# Patient Record
Sex: Female | Born: 1965
Health system: Southern US, Community
[De-identification: ages and names within clinical notes are randomized; demographics above are authoritative.]

## PROBLEM LIST (undated history)

## (undated) DIAGNOSIS — I422 Other hypertrophic cardiomyopathy: Secondary | ICD-10-CM

## (undated) DIAGNOSIS — C539 Malignant neoplasm of cervix uteri, unspecified: Secondary | ICD-10-CM

## (undated) DIAGNOSIS — I4892 Unspecified atrial flutter: Secondary | ICD-10-CM

## (undated) DIAGNOSIS — J449 Chronic obstructive pulmonary disease, unspecified: Secondary | ICD-10-CM

## (undated) DIAGNOSIS — J45909 Unspecified asthma, uncomplicated: Secondary | ICD-10-CM

## (undated) DIAGNOSIS — G4730 Sleep apnea, unspecified: Secondary | ICD-10-CM

## (undated) DIAGNOSIS — I4891 Unspecified atrial fibrillation: Secondary | ICD-10-CM

## (undated) HISTORY — PX: TUBAL LIGATION: SHX77

---

## 2003-05-20 ENCOUNTER — Ambulatory Visit (HOSPITAL_COMMUNITY): Admission: RE | Admit: 2003-05-20 | Discharge: 2003-05-20 | Payer: Self-pay | Admitting: Family Medicine

## 2003-06-03 ENCOUNTER — Encounter: Admission: RE | Admit: 2003-06-03 | Discharge: 2003-06-03 | Payer: Self-pay | Admitting: Neurosurgery

## 2003-06-14 HISTORY — PX: TRANSESOPHAGEAL ECHOCARDIOGRAM: SHX273

## 2003-06-16 ENCOUNTER — Encounter: Admission: RE | Admit: 2003-06-16 | Discharge: 2003-06-16 | Payer: Self-pay | Admitting: Neurosurgery

## 2003-07-07 ENCOUNTER — Encounter: Admission: RE | Admit: 2003-07-07 | Discharge: 2003-07-07 | Payer: Self-pay | Admitting: Neurosurgery

## 2004-04-21 ENCOUNTER — Ambulatory Visit (HOSPITAL_COMMUNITY): Admission: RE | Admit: 2004-04-21 | Discharge: 2004-04-21 | Payer: Self-pay | Admitting: Family Medicine

## 2004-04-28 ENCOUNTER — Ambulatory Visit (HOSPITAL_COMMUNITY): Admission: RE | Admit: 2004-04-28 | Discharge: 2004-04-28 | Payer: Self-pay | Admitting: Family Medicine

## 2004-06-13 DIAGNOSIS — I4891 Unspecified atrial fibrillation: Secondary | ICD-10-CM

## 2004-06-13 HISTORY — DX: Unspecified atrial fibrillation: I48.91

## 2004-06-13 HISTORY — PX: OTHER SURGICAL HISTORY: SHX169

## 2004-06-21 ENCOUNTER — Ambulatory Visit (HOSPITAL_COMMUNITY): Admission: RE | Admit: 2004-06-21 | Discharge: 2004-06-21 | Payer: Self-pay | Admitting: *Deleted

## 2004-06-21 ENCOUNTER — Encounter (INDEPENDENT_AMBULATORY_CARE_PROVIDER_SITE_OTHER): Payer: Self-pay | Admitting: *Deleted

## 2004-09-15 ENCOUNTER — Inpatient Hospital Stay (HOSPITAL_COMMUNITY): Admission: AD | Admit: 2004-09-15 | Discharge: 2004-09-18 | Payer: Self-pay | Admitting: Pediatrics

## 2004-09-15 ENCOUNTER — Encounter (INDEPENDENT_AMBULATORY_CARE_PROVIDER_SITE_OTHER): Payer: Self-pay | Admitting: Cardiovascular Disease

## 2004-12-09 ENCOUNTER — Encounter (HOSPITAL_COMMUNITY): Admission: RE | Admit: 2004-12-09 | Discharge: 2005-01-08 | Payer: Self-pay | Admitting: *Deleted

## 2005-01-10 ENCOUNTER — Encounter (HOSPITAL_COMMUNITY): Admission: RE | Admit: 2005-01-10 | Discharge: 2005-02-09 | Payer: Self-pay | Admitting: *Deleted

## 2005-02-11 ENCOUNTER — Encounter (HOSPITAL_COMMUNITY): Admission: RE | Admit: 2005-02-11 | Discharge: 2005-03-11 | Payer: Self-pay | Admitting: *Deleted

## 2005-03-14 ENCOUNTER — Encounter (HOSPITAL_COMMUNITY)
Admission: RE | Admit: 2005-03-14 | Discharge: 2005-04-13 | Payer: Self-pay | Admitting: Rehabilitative and Restorative Service Providers"

## 2005-04-27 ENCOUNTER — Ambulatory Visit (HOSPITAL_COMMUNITY): Admission: RE | Admit: 2005-04-27 | Discharge: 2005-04-27 | Payer: Self-pay

## 2006-02-20 ENCOUNTER — Ambulatory Visit (HOSPITAL_COMMUNITY): Admission: RE | Admit: 2006-02-20 | Discharge: 2006-02-20 | Payer: Self-pay | Admitting: Emergency Medicine

## 2006-03-08 ENCOUNTER — Ambulatory Visit (HOSPITAL_COMMUNITY): Admission: RE | Admit: 2006-03-08 | Discharge: 2006-03-08 | Payer: Self-pay | Admitting: Emergency Medicine

## 2007-11-14 ENCOUNTER — Encounter: Payer: Self-pay | Admitting: Obstetrics & Gynecology

## 2007-11-14 ENCOUNTER — Ambulatory Visit (HOSPITAL_COMMUNITY): Admission: RE | Admit: 2007-11-14 | Discharge: 2007-11-14 | Payer: Self-pay | Admitting: Obstetrics & Gynecology

## 2007-12-12 ENCOUNTER — Ambulatory Visit (HOSPITAL_COMMUNITY): Admission: RE | Admit: 2007-12-12 | Discharge: 2007-12-12 | Payer: Self-pay | Admitting: Family Medicine

## 2008-02-25 ENCOUNTER — Encounter (INDEPENDENT_AMBULATORY_CARE_PROVIDER_SITE_OTHER): Payer: Self-pay | Admitting: General Surgery

## 2008-02-25 ENCOUNTER — Observation Stay (HOSPITAL_COMMUNITY): Admission: RE | Admit: 2008-02-25 | Discharge: 2008-02-26 | Payer: Self-pay | Admitting: General Surgery

## 2008-05-11 ENCOUNTER — Emergency Department (HOSPITAL_COMMUNITY): Admission: EM | Admit: 2008-05-11 | Discharge: 2008-05-11 | Payer: Self-pay | Admitting: Emergency Medicine

## 2008-06-11 ENCOUNTER — Emergency Department (HOSPITAL_COMMUNITY): Admission: EM | Admit: 2008-06-11 | Discharge: 2008-06-11 | Payer: Self-pay | Admitting: Emergency Medicine

## 2008-08-28 ENCOUNTER — Ambulatory Visit (HOSPITAL_COMMUNITY): Admission: RE | Admit: 2008-08-28 | Discharge: 2008-08-28 | Payer: Self-pay | Admitting: Family Medicine

## 2008-09-17 ENCOUNTER — Ambulatory Visit (HOSPITAL_COMMUNITY): Admission: RE | Admit: 2008-09-17 | Discharge: 2008-09-17 | Payer: Self-pay | Admitting: Orthopedic Surgery

## 2008-09-17 ENCOUNTER — Ambulatory Visit: Payer: Self-pay | Admitting: Orthopedic Surgery

## 2008-09-17 DIAGNOSIS — M51379 Other intervertebral disc degeneration, lumbosacral region without mention of lumbar back pain or lower extremity pain: Secondary | ICD-10-CM | POA: Insufficient documentation

## 2008-09-17 DIAGNOSIS — M5137 Other intervertebral disc degeneration, lumbosacral region: Secondary | ICD-10-CM

## 2008-09-17 DIAGNOSIS — M5126 Other intervertebral disc displacement, lumbar region: Secondary | ICD-10-CM

## 2008-09-17 DIAGNOSIS — Z8679 Personal history of other diseases of the circulatory system: Secondary | ICD-10-CM | POA: Insufficient documentation

## 2008-09-24 ENCOUNTER — Encounter: Payer: Self-pay | Admitting: Orthopedic Surgery

## 2008-09-24 ENCOUNTER — Encounter (INDEPENDENT_AMBULATORY_CARE_PROVIDER_SITE_OTHER): Payer: Self-pay | Admitting: *Deleted

## 2008-10-01 ENCOUNTER — Telehealth: Payer: Self-pay | Admitting: Orthopedic Surgery

## 2008-10-03 ENCOUNTER — Ambulatory Visit (HOSPITAL_COMMUNITY): Admission: RE | Admit: 2008-10-03 | Discharge: 2008-10-03 | Payer: Self-pay | Admitting: Orthopedic Surgery

## 2008-10-08 ENCOUNTER — Ambulatory Visit: Payer: Self-pay | Admitting: Orthopedic Surgery

## 2008-10-15 ENCOUNTER — Encounter (HOSPITAL_COMMUNITY): Admission: RE | Admit: 2008-10-15 | Discharge: 2008-11-19 | Payer: Self-pay | Admitting: Orthopedic Surgery

## 2008-11-03 ENCOUNTER — Telehealth: Payer: Self-pay | Admitting: Orthopedic Surgery

## 2008-11-24 ENCOUNTER — Encounter (HOSPITAL_COMMUNITY): Admission: RE | Admit: 2008-11-24 | Discharge: 2008-12-24 | Payer: Self-pay | Admitting: Orthopedic Surgery

## 2008-11-26 ENCOUNTER — Encounter: Payer: Self-pay | Admitting: Orthopedic Surgery

## 2009-03-02 ENCOUNTER — Emergency Department (HOSPITAL_COMMUNITY): Admission: EM | Admit: 2009-03-02 | Discharge: 2009-03-02 | Payer: Self-pay | Admitting: Emergency Medicine

## 2009-03-04 ENCOUNTER — Emergency Department (HOSPITAL_COMMUNITY): Admission: EM | Admit: 2009-03-04 | Discharge: 2009-03-04 | Payer: Self-pay | Admitting: Emergency Medicine

## 2009-03-09 ENCOUNTER — Ambulatory Visit (HOSPITAL_COMMUNITY): Admission: RE | Admit: 2009-03-09 | Discharge: 2009-03-09 | Payer: Self-pay | Admitting: Family Medicine

## 2009-05-13 ENCOUNTER — Emergency Department (HOSPITAL_COMMUNITY): Admission: EM | Admit: 2009-05-13 | Discharge: 2009-05-13 | Payer: Self-pay | Admitting: Emergency Medicine

## 2009-05-15 ENCOUNTER — Ambulatory Visit (HOSPITAL_COMMUNITY): Admission: RE | Admit: 2009-05-15 | Discharge: 2009-05-15 | Payer: Self-pay | Admitting: General Surgery

## 2009-09-01 ENCOUNTER — Ambulatory Visit (HOSPITAL_COMMUNITY): Admission: RE | Admit: 2009-09-01 | Discharge: 2009-09-01 | Payer: Self-pay | Admitting: Family Medicine

## 2010-09-14 LAB — URINALYSIS, ROUTINE W REFLEX MICROSCOPIC
Bilirubin Urine: NEGATIVE
Glucose, UA: NEGATIVE mg/dL
Hgb urine dipstick: NEGATIVE
Ketones, ur: 40 mg/dL — AB
Nitrite: NEGATIVE
Protein, ur: NEGATIVE mg/dL
Specific Gravity, Urine: 1.02 (ref 1.005–1.030)
Urobilinogen, UA: 0.2 mg/dL (ref 0.0–1.0)
pH: 6 (ref 5.0–8.0)

## 2010-09-14 LAB — COMPREHENSIVE METABOLIC PANEL
ALT: 55 U/L — ABNORMAL HIGH (ref 0–35)
AST: 54 U/L — ABNORMAL HIGH (ref 0–37)
Albumin: 4.1 g/dL (ref 3.5–5.2)
Alkaline Phosphatase: 137 U/L — ABNORMAL HIGH (ref 39–117)
BUN: 11 mg/dL (ref 6–23)
CO2: 24 mEq/L (ref 19–32)
Calcium: 9 mg/dL (ref 8.4–10.5)
Chloride: 104 mEq/L (ref 96–112)
Creatinine, Ser: 0.79 mg/dL (ref 0.4–1.2)
GFR calc Af Amer: >60 mL/min (ref >60)
GFR calc non Af Amer: >60 mL/min (ref >60)
Glucose, Bld: 114 mg/dL — ABNORMAL HIGH (ref 70–99)
Potassium: 3.6 mEq/L (ref 3.5–5.1)
Sodium: 137 mEq/L (ref 135–145)
Total Bilirubin: 0.8 mg/dL (ref 0.3–1.2)
Total Protein: 7.8 g/dL (ref 6.0–8.3)

## 2010-09-14 LAB — CBC
HCT: 38.3 % (ref 36.0–46.0)
Hemoglobin: 12.8 g/dL (ref 12.0–15.0)
MCHC: 33.5 g/dL (ref 30.0–36.0)
MCV: 90 fL (ref 78.0–100.0)
Platelets: 345 10*3/uL (ref 150–400)
RBC: 4.26 MIL/uL (ref 3.87–5.11)
RDW: 14.1 % (ref 11.5–15.5)
WBC: 15.2 10*3/uL — ABNORMAL HIGH (ref 4.0–10.5)

## 2010-09-14 LAB — DIFFERENTIAL
Basophils Absolute: 0 10*3/uL (ref 0.0–0.1)
Basophils Relative: 0 % (ref 0–1)
Eosinophils Absolute: 0.2 10*3/uL (ref 0.0–0.7)
Eosinophils Relative: 2 % (ref 0–5)
Lymphocytes Relative: 10 % — ABNORMAL LOW (ref 12–46)
Lymphs Abs: 1.6 10*3/uL (ref 0.7–4.0)
Monocytes Absolute: 0.2 10*3/uL (ref 0.1–1.0)
Monocytes Relative: 1 % — ABNORMAL LOW (ref 3–12)
Neutro Abs: 13.1 10*3/uL — ABNORMAL HIGH (ref 1.7–7.7)
Neutrophils Relative %: 87 % — ABNORMAL HIGH (ref 43–77)

## 2010-09-14 LAB — WET PREP, GENITAL
Clue Cells Wet Prep HPF POC: NONE SEEN
Trich, Wet Prep: NONE SEEN
Yeast Wet Prep HPF POC: NONE SEEN

## 2010-09-14 LAB — PREGNANCY, URINE: Preg Test, Ur: NEGATIVE

## 2010-09-14 LAB — LIPASE, BLOOD: Lipase: 12 U/L (ref 11–59)

## 2010-10-26 NOTE — Op Note (Signed)
Kathleen Wright, Kathleen Wright               ACCOUNT NO.:  [id number]   MEDICAL RECORD NO.:  [id number]          PATIENT TYPE:  AMB   LOCATION:  DAY                           FACILITY:  [facility]   PHYSICIAN:  Lazaro Arms, M.D.   DATE OF BIRTH:  1965/09/11   DATE OF PROCEDURE:  11/14/2007  DATE OF DISCHARGE:                               OPERATIVE REPORT   PREOPERATIVE DIAGNOSES:  1. Menometrorrhagia.  2. Dysmenorrhea.   POSTOPERATIVE DIAGNOSES:  1. Menometrorrhagia.  2. Dysmenorrhea.   PROCEDURES:  1. Hysteroscopy.  2. Dilation and curettage.  3. Endometrial ablation.   SURGEON:  Lazaro Arms, MD   ANESTHESIA:  General endotracheal.   FINDINGS:  The patient had normal endometrial cavity.  No polyps,  fibroids, or myomas.   DESCRIPTION OF OPERATION:  The patient was taken to the operating room,  placed in a supine position where she underwent general endotracheal  anesthesia.  She was placed in the lithotomy position and prepped and  draped in the usual sterile fashion.  The Graves speculum was placed.  The cervix was grasped.  The cervix was dilated serially to allow  passage of the hysteroscope.  A hysteroscopy was performed and found to  be normal.  A vigorous uterine curettage was performed, uterine in all  areas.  ThermaChoice 3 endometrial ablation balloon was then used.  A 12  mL of D5W was required to maintain pressure between 100 and 92 mmHg  throughout the procedure.  It was heated at 87 degrees Celsius.  Total  therapy time was 8 minutes and 32 seconds.  The patient tolerated the  procedure well.  All the fluid was returned at the end of the procedure.  She was awakened from anesthesia, taken to the recovery room in good and  stable condition.  She received Ancef and Toradol preoperatively.      Lazaro Arms, M.D.  Electronically Signed    LHE/MEDQ  D:  11/14/2007  T:  11/15/2007  Job:  [id number]

## 2010-10-26 NOTE — Op Note (Signed)
Kathleen Wright, Kathleen Wright               ACCOUNT NO.:  [id number]   MEDICAL RECORD NO.:  [id number]          PATIENT TYPE:  OBV   LOCATION:  A331                          FACILITY:  [facility]   PHYSICIAN:  Tilford Pillar, MD      DATE OF BIRTH:  08-06-1965   DATE OF PROCEDURE:  DATE OF DISCHARGE:                               OPERATIVE REPORT   PREOPERATIVE DIAGNOSIS:  Cholelithiasis.   POSTOPERATIVE DIAGNOSIS:  Cholelithiasis.   PROCEDURE:  Laparoscopic cholecystectomy.   SURGEON:  Tilford Pillar, MD   ANESTHESIA:  General endotracheal, local anesthetic 1% Sensorcaine  plain.   SPECIMEN:  Gallbladder.   ESTIMATED BLOOD LOSS:  Minimal.   INDICATIONS:  The patient is a 45 year old female with a history of  pretty significant pulmonary and cardiovascular disease who presented to  my office with a history of epigastric and right upper quadrant  abdominal pain.  On evaluation, she did have a right upper quadrant  ultrasound, which demonstrated sizeable gallstones as well as  gallbladder wall thickening.  There is no biliary tree dilatation.  Her  symptomatology was consistent with biliary colic and the risks,  benefits, and alternatives of laparoscopic possible open cholecystectomy  including, but not limited to risk of bleeding, infection, bile leak,  small bowel injury, common bile duct injury as well as the possibility  of intraoperative cardiac or pulmonary events were discussed at length  with the patient.  In addition, it was discussed with the patient due to  her pulmonary and cardiovascular history that she would be monitored in  the 23-hour observation following the procedure as an inpatient.  The  patient's questions and concerns were addressed.  The patient was  consented for the planned procedure.   OPERATION:  The patient was taken to the operating room and was placed  in supine position on the operating room table at which point general  anesthetic was administered.  Once the  patient was asleep, she was  endotracheally intubated by anesthesia.  At this point, her abdomen was  prepped with DuraPrep solution and prepped and draped in the usual  fashion.  A stab incision was created supraumbilically.  Additional  dissection down through subcuticular tissue was utilize using a Kocher  clamp, which was utilized to grasp the anterior abdominal wall fascia  and some lifted this anteriorly.  A Veress needle was inserted.  Saline  drop test was utilized to confirm intraperitoneal placement and then  pneumoperitoneum was initiated.  Once , the patient's pneumoperitoneum  was obtained, 11-mm trocar was inserted over the laparoscope, allowing  visualization of the trocar entering into the peritoneal cavity.  At  this point, the inner cannula was removed.  The laparoscope was  reinserted.  There was no evidence of any trocar or Veress needle  placement injury.  The patient was placed in a reverse Trendelenburg  left lateral decubitus position.  The remaining trocars were placed in a  similar fashion via stab incision and placement of the trocars under  direct visualization.  A 11-mm trocar was placed in the epigastrium, 5-  mm trocar was  placed in midline between the two 11-mm trocars, and a 5-  mm was placed in a right lateral abdominal wall.  The fundus of the  gallbladder was lifted up and over the liver.  After several adhesions  from the capsular adhesions to the anterior abdominal wall were  dissected using electrocautery from the hepatic capsule to the anterior  abdominal wall.  With the gallbladder elevated.  Several omental  adhesions were bluntly stripped off the gallbladder with a Recruitment consultant.  The infundibulum was identified.  The peritoneal reflection  onto the infundibulum was bluntly stripped using a Art gallery manager.  This allowed exposure of the cystic duct entering into the infundibulum  of the gallbladder.  A window was created behind the cystic  duct.  Three  EndoClips were placed proximally and one distally and cystic duct was  divided between two most distal clips.  Similarly, the cystic artery was  identified.  A window was created behind the cystic artery.  Two  EndoClips were placed proximally, one distally, and the cystic artery  was divided between two most distal clips.  At this point,  electrocautery was utilized to dissect the gallbladder free from the  gallbladder fossa.  Once the gallbladder was free, it was placed in  EndoCatch bag.  Hemostasis was obtained on the gallbladder fossa using  electrocautery.  A piece of Surgicel was then placed in the gallbladder  fossa.  At this point attention was turned to placement of the fascial  closure sutures.   Using an Endoclose suture passing device a 2-0 Vicryl suture was passed  through both 11-mm trocar sites.  With these Vicryl sutures in place,  the gallbladder was removed through the epigastric trocar site.  Blunt  and sharp dissection was required to enlarge the epigastric trocar site  enough to successfully treat the gallbladder.  The gallbladder was  removed intact in EndoCatch bag, which was placed on the back table and  sent as a permanent specimen to pathology.  At this point, the  pneumoperitoneum was evacuated.  The Vicryl sutures were secured.  Local  anesthetic was instilled.  The 4-0 Monocryl was utilized to  reapproximate the skin edges in a running subcuticular suture.  The skin  was washed and dried with moistened dry towel.  Benzoin was applied  around the incision.  Half-inch Steri-Strips were placed.  The drapes  were removed.  The patient was allowed to come out of general anesthetic  and was transferred back to regular hospital bed.  She was transferred  to the Postanesthetic Care Unit in stable condition.  At the conclusion  of the procedure, all instruments, sponge, and needle counts were  correct.  The patient tolerated the procedure  well.      Tilford Pillar, MD  Electronically Signed     BZ/MEDQ  D:  02/25/2008  T:  02/26/2008  Job:  [id number]   cc:   Corrie Mckusick, M.D.  Fax: [phone]

## 2010-10-26 NOTE — H&P (Signed)
Kathleen Wright, [place]ER[place]               ACCOUNT NO.:  [id number]   MEDICAL RECORD NO.:  [id number]          PATIENT TYPE:  AMB   LOCATION:  DAY                           FACILITY:  [facility]   PHYSICIAN:  Tilford Pillar, MD      DATE OF BIRTH:  [birth year]   DATE OF ADMISSION:  DATE OF DISCHARGE:  LH                              HISTORY & PHYSICAL   CHIEF COMPLAINT:  Right upper quadrant abdominal pain.   HISTORY OF PRESENT ILLNESS:  The patient is a 45 year old female who  presented to my office with several-week history of right upper quadrant  abdominal pain.  She denied any radiation.  There was no exacerbating or  relieving features.  She had no nausea or vomiting.  She described the  pain is colicky.  She does have positive bloating, positive flatus,  occasional loose stools.  She has had no bowel changes with no melena  and no hematochezia.  She has had no acholic stools.  No jaundice.  No  fever or chills.  No urinary changes.   PAST MEDICAL HISTORY:  Consistent with coronary artery disease with  myopathy, as well as COPD and bronchitis.   PAST SURGICAL HISTORY:  She has had a CABG in 2005.  She has had tubal  oblation.   MEDICATIONS:  Albuterol, she does take twice daily.  She is not on any  home oxygen.   ALLERGIES:  No known drug allergies.   SOCIAL HISTORY:  She is a half to 1 pack per day smoker.  No alcohol  use.  No recreational drug use.  Pregnancies 3.   PERTINENT FAMILY HISTORY:  No known family history of biliary disease.  She does have Native American ancestry.  She does have a family history  of colon cancer.   REVIEW OF SYSTEMS:  CONSTITUTIONAL:  Unremarkable.  EYES:  Unremarkable.  EARS, NOSE, AND THROAT:  Occasional rhinorrhea.  RESPIRATORY:  Shortness  of breath and wheezing, occasional with her asthma symptoms.  CARDIOVASCULAR:  Unremarkable.  GASTROINTESTINAL:  As per HPI, otherwise  unremarkable.  GENITOURINARY:  Unremarkable.  MUSCULOSKELETAL:  Unremarkable.  SKIN:  Unremarkable.  ENDOCRINE:  Unremarkable.  NEURO:  Paresthesias occasionally of the hands.   PHYSICAL EXAMINATION:  GENERAL:  The patient is morbidly obese.  She is  not any acute distress.  She is alert and oriented x3.  HEENT:  Scalp; no deformities, no masses.  Eyes; pupils are equal,  round, and reactive.  Extraocular movements intact.  No scleral icterus  or conjunctival pallor is noted.  Oral mucosa pink.  Normal occlusion.  NECK:  Trachea is midline.  No cervical lymphadenopathy.  PULMONARY:  Unlabored respiration.  No wheezes or crackles are  appreciated on exam today.  She has bilateral full breath sounds.  CARDIOVASCULAR:  Regular rate and rhythm.  She does have a systolic  murmur noted.  She has no gallops.  She has 2+ radial and dorsalis pedis  pulses bilaterally.  ABDOMEN:  Positive bowel sounds.  Abdomen is soft.  She does have mild  right upper quadrant abdominal pain.  No peritoneal signs.  No hernias.  No masses.  SKIN:  Warm and dry.   PERTINENT LABORATORY AND RADIOGRAPHIC STUDIES:  She did have a CT  evaluation of the abdomen and pelvis demonstrating no evidence of free  air or free fluid but did demonstrate stones within the gallbladder.  No  biliary tree dilatation.   ASSESSMENT/PLAN:  Cholelithiasis.  At this point, it was discussed with  the patient, the increased risk of operation secondary to her  cardiovascular disease.  She will be evaluated by Cardiology and her  primary physician prior to proceeding with any operative intervention.  Risks, benefits, and alternatives of a laparoscopic possible open  cholecystectomy were discussed at length with the patient, including but  not limited to the risk of bleeding, infection, bile leak, small-bowel  injury, common bile duct injury, as well as possibility of cardiac or  pulmonary events.  At this point, she will be continued on  recommendations by her cardiologist and anticipated 23-hour  observation  status for the patient in the recovery phase.  Her operation will be  scheduled upon obtaining cardiac and pulmonary clearance.      Tilford Pillar, MD  Electronically Signed     BZ/MEDQ  D:  02/14/2008  T:  02/15/2008  Job:  [id number]   cc:   Corrie Mckusick, M.D.  Fax: [phone]

## 2010-10-29 NOTE — Procedures (Signed)
Kathleen Wright, Kathleen Wright               ACCOUNT NO.:  [id number]   MEDICAL RECORD NO.:  [id number]          PATIENT TYPE:  OUT   LOCATION:  RAD                           FACILITY:  [facility]   PHYSICIAN:  Dani Gobble, MD       DATE OF BIRTH:  20-Oct-1965   DATE OF PROCEDURE:  DATE OF DISCHARGE:                                  ECHOCARDIOGRAM   REFERRING PHYSICIAN:  Dr. Corrie Mckusick.   INDICATIONS:  Ms. Hurd is a 45 year old female who has been experiencing  shortness of breath, and was found to have cardiomegaly on her chest x-ray.   TECHNICAL QUALITY:  Adequate.   FINDINGS:  1.  The aorta was within normal limits at 2.8 cm.  2.  The left atrium was moderately to markedly dilated.  No obvious clots or      masses were appreciated, and the patient appeared to be in sinus rhythm      during this procedure.  3.  The intraventricular septum was markedly-thickened at 2.0 cm with      additional basal septal hypertrophy overlay.  The posterior wall was      also moderately thickened at 1.7 cm.  4.  The aortic valve was thin, trileaflet and pliable with normal leaflet      excursion.  Aortic insufficiency was noted to be present, but the degree      of this insufficiency was difficult to quantitate.  Doppler      interrogation of the left ventricular outflow tract revealed a markedly      elevated velocity measured at 4.3 meters per second, corresponding to a      peak gradient of 72 mmHg and mean gradient of 44 mmHg.  Left ventricular      outflow tract diameter was measured at 1.2 cm which is quite small, but      I am dubious that the angle of the plane was such that this is an      accurate measurement.  There was a suggestion of early closure of the      aortic valve leaflets.  5.  The mitral valve was mildly thickened.  There did appear to be mild      prolapse of the anterior leaflet of the mitral valve.  There was      moderate-to-severe mitral regurgitation with two separate jets  and      pulmonary vein flow reversal.  There was systolic anterior motion of the      mitral valve.  6.  Pulmonic valve was incompletely visualized, but appeared to be grossly      structurally normal.  7.  The tricuspid valve also appeared grossly structurally normal.  8.  The left ventricle was small in size with the LVIDD measured at 3.2 cm,      and the LVISD measured at 1.9 cm.  Overall, left ventricular systolic      function was quite vigorous, and no obvious regional wall motion      abnormalities were noted.  9.  The right atrium and right ventricle  appeared normal in size, and right      ventricular systolic function also appeared vigorous.  The interatrial      septum was notable for bowing from left to right suggestive of elevated      left atrial pressures.  There was no obvious significant pericardial      effusion noted.   IMPRESSION:  1.  Moderate to marked left atrial enlargement.  2.  Marked concentric left ventricular hypertrophy with the intraventricular      septum more thickened than the posterior wall with additional basal      septal hypertrophy overlay.  There does appear to be      obstruction/protrusion into the left ventricular outflow tract.  3.  Aortic valve leaflets are thin, trileaflet and pliable with normal      leaflet excursion.  There does appear to be early closure.  There is a      degree of aortic insufficiency, although this is difficult to quantitate      on this study.  4.  The left ventricular outflow tract does appear small and measures at 1.2      cm, but I am not convinced that this measurement is accurate.  5.  The mitral valve leaflets are mildly thickened with mild prolapse of the      anterior leaflet of the mitral valve.  There is moderate-to-severe      mitral regurgitation with two separate jets and pulmonary vein flow      reversal is noted.  There is systolic anterior motion of the mitral      valve.  6.  The left ventricular  cavity size is small with vigorous left ventricular      systolic function and no regional wall motion abnormalities noted.  7.  The interatrial septum exhibits bowing from left to right suggestive of      elevated left atrial pressures.  8.  This conglomeration of findings is quite suggestive of hypertrophic      obstructive cardiomyopathy.  9.  There is a dynamic left ventricular outflow tract obstruction with a      significant pressure gradient (peak gradient measured at 72 mmHg, mean      gradient of 44 mmHg).      AB/MEDQ  D:  04/29/2004  T:  04/29/2004  Job:  [id number]   cc:   Corrie Mckusick, M.D.  Fax: [phone]

## 2010-10-29 NOTE — Consult Note (Signed)
Kathleen Wright, Kathleen Wright               ACCOUNT NO.:  [id number]   MEDICAL RECORD NO.:  [id number]          PATIENT TYPE:  INP   LOCATION:  3707                         FACILITY:  [facility]   PHYSICIAN:  Mark E. Severiano Gilbert, M.D.    DATE OF BIRTH:  1966-06-13   DATE OF CONSULTATION:  09/16/2004  DATE OF DISCHARGE:                                   CONSULTATION   CONSULTING PHYSICIAN:  Richard A. Alanda Amass, M.D.   REASON FOR CONSULTATION:  Tachycardia.   HISTORY OF PRESENT ILLNESS:  A 45 year old female with long-standing  hypertrophic cardiomyopathy obstruction who had complicating mitral  regurgitation with left atrial enlargement, who underwent open surgical  myomectomy at [facility] approximately two weeks ago with an  intraoperative documentation of reduction of resting gradient from  approximately 120 to 20.  Her postoperative care was complicated by  transient atrial fibrillation.  She was placed on Amiodarone therapy,  discharged from the hospital.  Approximately eight days ago, she had the  onset of sudden tachycardia associated with shortness of breath and fatigue  and some fluid retention.  It took her approximately one week to be seen in  cardiology clinic at [facility], at which time she was noted to be in a  tachycardia and was admitted to the hospital.  She had been taking  Amiodarone, this had been continued IV overnight.  She was seen at this  time, and there was some concern about the exact arrhythmia diagnosis.  I  was asked to see for my comments and management.  She is currently  hemodynamically stable, feeling relatively well, sitting on the side of the  bed.  I reviewed her 12 lead electrocardiogram which demonstrates a wide  complex regular tachycardia suspicious for 2:1 AV conduction from either an  atrial tachycardia or atrial flutter.  Fortunately, during the night she had  several periods of increased AV block which allowed very easily to document  the presence of  flutter waves, making the diagnosis what looks like  is______________atrial flutter.  Perhaps the postoperative propensity for  atrial dysrhythmias and the use of Amiodarone has stabilized her atrial  fibrillation into an atrial flutter type rhythm.  As such I think, rate  control is going to be an important point in her care.   PAST MEDICAL HISTORY:  1.  Asthma.  2.  Hypertrophic cardiomyopathy.  3.  Mitral regurgitation.  4.  Atrial dysrhythmias.   PAST SURGICAL HISTORY:  Myomectomy as described in the HPI.   FAMILY HISTORY:  Positive for heart failure, lung disease, diabetes.   SOCIAL HISTORY:  She smokes.   ALLERGIES:  No known drug allergies.   MEDICATIONS ON ADMISSION:  Please see the hospital record.   REVIEW OF SYSTEMS:  As per admitting HPI.   PHYSICAL EXAMINATION:  VITAL SIGNS:  On admission, her blood pressure was  noted to be 112/70, heart rate 120, respirations 20, saturation 99% on room  air, afebrile.  HEENT:  Normocephalic, atraumatic.  NECK:  Supple, 2+ carotids, there are flutter waves present.  CHEST:  Clear to auscultation and percussion.  CARDIOVASCULAR:  Regular rate and rhythm.  There is a soft 2/6 systolic  holosystolic murmur at the apex.  The heart rate is rapid, I do not hear a  gallop.  ABDOMEN:  Soft and nontender.  Positive bowel sounds.  EXTREMITIES:  No cyanosis or clubbing, trace edema.  NEUROLOGIC:  Alert and oriented x3.  Motor and sensory nonfocal.   LABORATORY DATA:  Telemetry and 12 lead described in HPI.   IMPRESSION:  Atrial flutter with 2:1 block.   RECOMMENDATIONS:  1.  Anticoagulation as she has been in the rhythm greater than 48 hours.  2.  Addition of calcium channel blocker plus or minus digoxin since her      gradient was reportedly reduced during surgery for additional rate      control.  I would continue the Amiodarone at 400 mg daily at least for      six weeks postoperatively to try to stabilize sinus rhythm if we       cardiovert her.  After two to three weeks of anticoagulation, one can      consider cardioversion.  Should there be a more urgent desire to      cardiovert her, I think a TEE cardioversion would be also recommended      with again at least two to three weeks of anticoagulation      postoperatively.  She has been in rhythm a week, tolerating it well      hemodynamically, I think will get better rate control with the addition      of additional medications.  I think that she will do well with      anticoagulation and elective cardioversion in two to three weeks.      MEP/MEDQ  D:  09/16/2004  T:  09/16/2004  Job:  [id number]

## 2010-10-29 NOTE — Discharge Summary (Signed)
NAMEJATORIA, Kathleen Wright               ACCOUNT NO.:  [id number]   MEDICAL RECORD NO.:  [id number]          PATIENT TYPE:  INP   LOCATION:  3707                         FACILITY:  [facility]   PHYSICIAN:  Dani Gobble, MD       DATE OF BIRTH:  1966/03/17   DATE OF ADMISSION:  09/15/2004  DATE OF DISCHARGE:  09/18/2004                                 DISCHARGE SUMMARY   DISCHARGE DIAGNOSES:  1.  Atrial flutter with rapid ventricular response.  2.  Hypertrophic obstructive cardiomyopathy, diagnosed December, 2005.  3.  Recent septal myectomy, August 30, 2004, by Dr. Judd Gaudier at [facility].  4.  History of asthma.  5.  Two herniated cervical disks.  6.  Anemia.  7.  Abnormal troponin-I, mildly elevated.  8.  Nausea and vomiting, consider reaction to amiodarone.  9.  Leukocytosis, possibly related to IV infiltration of the right hand.   TRANSFER CONDITION:  Stable.   TRANSFER MEDICATIONS:  1.  IV Cardizem drip at 3 mg/h.  2.  IV heparin at 1400 units/h.  3.  Coumadin per pharmacy, last dose 7.5 mg on September 17, 2004.  4.  Lopressor 15 mg q.8 h.  5.  Enteric-coated aspirin 81 mg daily.  6.  Lasix 40 mg daily.  7.  Potassium chloride 20 mEq daily.  8.  NuIron 150 mg b.i.d.  9.  Folic acid 2 mg daily.  10. Protonix 40 mg daily.  11. Amiodarone 200 mg daily.  12. Phenergan 12.5 IV p.r.n. nausea, pharmatine.  13. Percocet 1-2 every four hours p.r.n. for pain.  14. Albuterol inhaler 2 puff every four hours p.r.n.   HISTORY OF PRESENT ILLNESS:  This 45 year old female was admitted by Dani Gobble, M.D., her primary care cardiologist, after presenting to her  office on September 15, 2004.  She was feeling miserable.  The Wednesday prior  to the admission, her heart rate had jumped up, and she was somewhat short  of breath.  She experienced some mild chest pressure without discomfort.  She rested, and her heart rate gradually decreased.  Then, on Thursday, it  increased again and had  been elevated ever since that time.  Her only  symptom with this increased heart rate has been shortness of breath.  She  does not feel comfortable with the palpitations and increased heart rate.  She also has had lower extremity edema which has been worse and had gained,  prior to the admission, 18 pounds since her discharge from [facility].   PAST MEDICAL HISTORY:  Asthma, bilateral tubal ligation, herniated cervical  disk x2, and history of steroid injection.   ALLERGIES:  No known allergies.   OUTPATIENT MEDICATIONS:  1.  Motrin 100 p.r.n.  2.  Albuterol inhalers p.r.n.  3.  Oxycodone p.r.n.  4.  Stool softener p.r.n.  5.  Aspirin 81 mg daily.  6.  Amiodarone 200 mg daily.  7.  Atrovent inhaler p.r.n.  8.  Lasix 40 mg daily.  9.  Potassium replacement.   FAMILY HISTORY/SOCIAL HISTORY/REVIEW OF SYSTEMS:  See H&P.   PHYSICAL EXAMINATION AT DISCHARGE:  VITAL SIGNS:  Blood pressure 98/73,  pulse 122, respirations 20, temperature 97.6, oxygen saturation 98%.  Weight  is currently 204.9.  On admission, she was 193.8.  GENERAL:  Alert and oriented female in no acute distress, does complain of  right hand pain.  HEART:  Regular rate and rhythm, rate of 120.  She has a 1/6 systolic  ejection murmur.  NECK:  Supple, minimal JVD.  LUNGS:  Without rales.  ABDOMEN:  Soft, positive bowel sounds.  EXTREMITIES:  Trace pretibial edema.   LABORATORY DATA:  Admitting laboratory data:  Hemoglobin 9.2, hematocrit  26.5, WBC 11.7, MCV 86.2, platelets 402,000, neutrophils 82, lymphocytes 1,  monocytes 5, eosinophils 2, basophils 0.  Today laboratory data:  Hemoglobin  8.4, h ct 25.3, platelets 475,000, and WBC is up to 12.6.   Chemistry:  Sodium 137 on admission, potassium 3.7, chloride 101, CO2 27,  glucose 117, BUN 22, creatinine 1, calcium 8.6.  Total protein 6.1, albumin  3.  AST 30, ALT 89, total bilirubin 0.3, magnesium 1.8, phosphorus 3.7, and  on transfer sodium 129, potassium  4.2, chloride 99, CO2 22, BUN 15,  creatinine 0.9, glucose 118.  Pro time today is 16.5, INR of 1.5, and  heparin level was 0.42.   Cardiac enzymes:  CK has ranged 66, 69, and 62.  MB 7.2, 7.9, and 7.7, and  troponin-I 0.11, 0.11, and 0.10.   TSH 3.789.   EKG:  Atrial flutter, heart rate 136, left axis deviation, left bundle-  branch block.  Follow-up with slower heart rate, atrial flutter, heart rate  68, again left axis deviation, now nonspecific intraventricular conduction  delay, possible anterior-inferolateral ischemia.   HOSPITAL COURSE:  Kathleen Wright was admitted by Dr. Domingo Sep after  experiencing atrial flutter.  Initially, there was concern either sinus  tachycardia versus atrial flutter, but Dr. Severiano Gilbert, our electrophysiologist,  reviewed the strips and felt it was atrial flutter.  When she slowed down,  it was confirmed as atrial flutter.   She was brought in to [facility], put on a Cardizem drip as well as heparin.  Since that time, she has had nausea and vomiting, possibly related to the  amiodarone, possibly related to the beta blocker.   She has been in IV heparin with a slow drop down in hemoglobin and  hematocrit.   By September 18, 2004, Dr. Tresa Endo saw her.  Her heart rate was back up to 120  despite IV Cardizem.  A beta blocker was again added to the medical regimen.  She was also found to have leukocytosis.  A UA was ordered as well as to  check her stools for blood, but prior to this being done, the patient  requested transfer to [facility] back to the care of her surgeon.  I  called Dr. Silvestre Mesi, discussed the issues, and he agreed to see her back.   The patient and her family requested to be transferred to [facility].  I discussed  with Dr. Tresa Endo, and he agreed with the transfer, and Kathleen Wright will be  transferred to [facility] through an ambulance service, either [facility] or through  [facility].      LRI/MEDQ  D:  09/18/2004  T:  09/18/2004  Job:  [id number]    cc:   Dani Gobble, MD  Fax: [phone]   Judd Gaudier, M.D.  [employer]   Corrie Mckusick, M.D.  Fax: [phone]

## 2011-01-17 ENCOUNTER — Other Ambulatory Visit (HOSPITAL_COMMUNITY): Payer: Self-pay | Admitting: Family Medicine

## 2011-01-17 DIAGNOSIS — Z139 Encounter for screening, unspecified: Secondary | ICD-10-CM

## 2011-01-24 ENCOUNTER — Ambulatory Visit (HOSPITAL_COMMUNITY)
Admission: RE | Admit: 2011-01-24 | Discharge: 2011-01-24 | Disposition: A | Discharge status: Home / Self Care | Payer: Medicare Other | Source: Ambulatory Visit | Attending: Family Medicine | Admitting: Family Medicine

## 2011-01-24 DIAGNOSIS — Z139 Encounter for screening, unspecified: Secondary | ICD-10-CM

## 2011-01-24 DIAGNOSIS — Z1231 Encounter for screening mammogram for malignant neoplasm of breast: Secondary | ICD-10-CM | POA: Insufficient documentation

## 2011-03-10 LAB — URINE MICROSCOPIC-ADD ON

## 2011-03-10 LAB — CBC
MCHC: 34.4
MCV: 86.4
Platelets: 330
RBC: 4.24
WBC: 11 — ABNORMAL HIGH

## 2011-03-10 LAB — URINALYSIS, ROUTINE W REFLEX MICROSCOPIC
Bilirubin Urine: NEGATIVE
Glucose, UA: NEGATIVE
Hgb urine dipstick: NEGATIVE
Specific Gravity, Urine: 1.025

## 2011-03-10 LAB — COMPREHENSIVE METABOLIC PANEL
ALT: 18
AST: 19
Albumin: 3.5
CO2: 24
Calcium: 9.2
Chloride: 107
Creatinine, Ser: 0.95
GFR calc Af Amer: >60
Sodium: 135

## 2011-03-15 LAB — URINALYSIS, ROUTINE W REFLEX MICROSCOPIC
Ketones, ur: NEGATIVE mg/dL
Nitrite: NEGATIVE
Protein, ur: NEGATIVE mg/dL
Urobilinogen, UA: 0.2 mg/dL (ref 0.0–1.0)

## 2011-03-15 LAB — BASIC METABOLIC PANEL
Chloride: 107 mEq/L (ref 96–112)
GFR calc non Af Amer: 55 mL/min — ABNORMAL LOW (ref >60)
Glucose, Bld: 106 mg/dL — ABNORMAL HIGH (ref 70–99)
Potassium: 4.1 mEq/L (ref 3.5–5.1)
Sodium: 135 mEq/L (ref 135–145)

## 2011-03-15 LAB — CBC
HCT: 37.3 % (ref 36.0–46.0)
Hemoglobin: 12.4 g/dL (ref 12.0–15.0)
MCV: 87.6 fL (ref 78.0–100.0)
RDW: 15.8 % — ABNORMAL HIGH (ref 11.5–15.5)

## 2011-03-15 LAB — DIFFERENTIAL
Basophils Absolute: 0.1 10*3/uL (ref 0.0–0.1)
Eosinophils Relative: 2 % (ref 0–5)
Lymphocytes Relative: 14 % (ref 12–46)
Lymphs Abs: 2 10*3/uL (ref 0.7–4.0)
Monocytes Absolute: 0.9 10*3/uL (ref 0.1–1.0)

## 2011-03-15 LAB — URINE CULTURE

## 2011-03-15 LAB — PREGNANCY, URINE: Preg Test, Ur: NEGATIVE

## 2011-03-16 LAB — BASIC METABOLIC PANEL
BUN: 19
CO2: 25
Chloride: 105
Creatinine, Ser: 0.97

## 2011-03-16 LAB — CBC
MCHC: 34.9
MCV: 87.8
Platelets: 360

## 2011-03-16 LAB — HCG, QUANTITATIVE, PREGNANCY: hCG, Beta Chain, Quant, S: <2

## 2013-09-03 ENCOUNTER — Other Ambulatory Visit (HOSPITAL_COMMUNITY): Payer: Self-pay | Admitting: Family Medicine

## 2013-09-03 ENCOUNTER — Ambulatory Visit (HOSPITAL_COMMUNITY)
Admission: RE | Admit: 2013-09-03 | Discharge: 2013-09-03 | Disposition: A | Discharge status: Home / Self Care | Payer: Medicare Other | Source: Ambulatory Visit | Attending: Family Medicine | Admitting: Family Medicine

## 2013-09-03 DIAGNOSIS — G8929 Other chronic pain: Secondary | ICD-10-CM

## 2013-09-03 DIAGNOSIS — R05 Cough: Secondary | ICD-10-CM

## 2013-09-03 DIAGNOSIS — R059 Cough, unspecified: Secondary | ICD-10-CM | POA: Insufficient documentation

## 2013-09-03 DIAGNOSIS — R062 Wheezing: Secondary | ICD-10-CM | POA: Insufficient documentation

## 2014-06-12 ENCOUNTER — Other Ambulatory Visit (HOSPITAL_COMMUNITY): Payer: Self-pay | Admitting: Internal Medicine

## 2014-06-12 DIAGNOSIS — Z139 Encounter for screening, unspecified: Secondary | ICD-10-CM

## 2014-06-18 ENCOUNTER — Ambulatory Visit (HOSPITAL_COMMUNITY)
Admission: RE | Admit: 2014-06-18 | Discharge: 2014-06-18 | Disposition: A | Discharge status: Home / Self Care | Payer: Medicare Other | Source: Ambulatory Visit | Attending: Internal Medicine | Admitting: Internal Medicine

## 2014-06-18 ENCOUNTER — Ambulatory Visit (HOSPITAL_COMMUNITY): Payer: Medicare Other

## 2014-06-18 DIAGNOSIS — Z1231 Encounter for screening mammogram for malignant neoplasm of breast: Secondary | ICD-10-CM | POA: Insufficient documentation

## 2014-06-18 DIAGNOSIS — Z139 Encounter for screening, unspecified: Secondary | ICD-10-CM

## 2014-10-03 DIAGNOSIS — I4891 Unspecified atrial fibrillation: Secondary | ICD-10-CM | POA: Diagnosis not present

## 2014-10-03 DIAGNOSIS — J449 Chronic obstructive pulmonary disease, unspecified: Secondary | ICD-10-CM | POA: Diagnosis not present

## 2014-10-03 DIAGNOSIS — Z6837 Body mass index (BMI) 37.0-37.9, adult: Secondary | ICD-10-CM | POA: Diagnosis not present

## 2014-10-03 DIAGNOSIS — J302 Other seasonal allergic rhinitis: Secondary | ICD-10-CM | POA: Diagnosis not present

## 2015-01-13 DIAGNOSIS — F419 Anxiety disorder, unspecified: Secondary | ICD-10-CM | POA: Diagnosis not present

## 2015-01-13 DIAGNOSIS — M722 Plantar fascial fibromatosis: Secondary | ICD-10-CM | POA: Diagnosis not present

## 2015-01-13 DIAGNOSIS — Z1389 Encounter for screening for other disorder: Secondary | ICD-10-CM | POA: Diagnosis not present

## 2015-01-13 DIAGNOSIS — Z6836 Body mass index (BMI) 36.0-36.9, adult: Secondary | ICD-10-CM | POA: Diagnosis not present

## 2015-01-13 DIAGNOSIS — G894 Chronic pain syndrome: Secondary | ICD-10-CM | POA: Diagnosis not present

## 2015-04-21 DIAGNOSIS — E782 Mixed hyperlipidemia: Secondary | ICD-10-CM | POA: Diagnosis not present

## 2015-04-21 DIAGNOSIS — G894 Chronic pain syndrome: Secondary | ICD-10-CM | POA: Diagnosis not present

## 2015-04-21 DIAGNOSIS — Z23 Encounter for immunization: Secondary | ICD-10-CM | POA: Diagnosis not present

## 2015-04-21 DIAGNOSIS — J209 Acute bronchitis, unspecified: Secondary | ICD-10-CM | POA: Diagnosis not present

## 2015-04-21 DIAGNOSIS — J0100 Acute maxillary sinusitis, unspecified: Secondary | ICD-10-CM | POA: Diagnosis not present

## 2015-04-21 DIAGNOSIS — I10 Essential (primary) hypertension: Secondary | ICD-10-CM | POA: Diagnosis not present

## 2015-04-21 DIAGNOSIS — Z1389 Encounter for screening for other disorder: Secondary | ICD-10-CM | POA: Diagnosis not present

## 2015-06-09 ENCOUNTER — Emergency Department (HOSPITAL_COMMUNITY): Payer: Medicare Other

## 2015-06-09 ENCOUNTER — Inpatient Hospital Stay (HOSPITAL_COMMUNITY)
Admission: EM | Admit: 2015-06-09 | Discharge: 2015-06-12 | DRG: 309 | Disposition: A | Discharge status: Home / Self Care | Payer: Medicare Other | Attending: Internal Medicine | Admitting: Internal Medicine

## 2015-06-09 ENCOUNTER — Encounter (HOSPITAL_COMMUNITY): Payer: Self-pay | Admitting: *Deleted

## 2015-06-09 DIAGNOSIS — R0789 Other chest pain: Secondary | ICD-10-CM | POA: Diagnosis not present

## 2015-06-09 DIAGNOSIS — Z79899 Other long term (current) drug therapy: Secondary | ICD-10-CM

## 2015-06-09 DIAGNOSIS — J45909 Unspecified asthma, uncomplicated: Secondary | ICD-10-CM | POA: Diagnosis not present

## 2015-06-09 DIAGNOSIS — Z9889 Other specified postprocedural states: Secondary | ICD-10-CM | POA: Diagnosis not present

## 2015-06-09 DIAGNOSIS — Z8541 Personal history of malignant neoplasm of cervix uteri: Secondary | ICD-10-CM

## 2015-06-09 DIAGNOSIS — R000 Tachycardia, unspecified: Secondary | ICD-10-CM | POA: Diagnosis not present

## 2015-06-09 DIAGNOSIS — I421 Obstructive hypertrophic cardiomyopathy: Secondary | ICD-10-CM | POA: Diagnosis not present

## 2015-06-09 DIAGNOSIS — J449 Chronic obstructive pulmonary disease, unspecified: Secondary | ICD-10-CM | POA: Diagnosis not present

## 2015-06-09 DIAGNOSIS — R7989 Other specified abnormal findings of blood chemistry: Secondary | ICD-10-CM | POA: Diagnosis not present

## 2015-06-09 DIAGNOSIS — I484 Atypical atrial flutter: Secondary | ICD-10-CM

## 2015-06-09 DIAGNOSIS — R079 Chest pain, unspecified: Secondary | ICD-10-CM | POA: Diagnosis not present

## 2015-06-09 DIAGNOSIS — I4892 Unspecified atrial flutter: Secondary | ICD-10-CM | POA: Diagnosis not present

## 2015-06-09 DIAGNOSIS — R002 Palpitations: Secondary | ICD-10-CM | POA: Diagnosis present

## 2015-06-09 DIAGNOSIS — I248 Other forms of acute ischemic heart disease: Secondary | ICD-10-CM | POA: Diagnosis not present

## 2015-06-09 DIAGNOSIS — Z720 Tobacco use: Secondary | ICD-10-CM | POA: Diagnosis not present

## 2015-06-09 DIAGNOSIS — I499 Cardiac arrhythmia, unspecified: Secondary | ICD-10-CM | POA: Diagnosis not present

## 2015-06-09 DIAGNOSIS — R778 Other specified abnormalities of plasma proteins: Secondary | ICD-10-CM

## 2015-06-09 DIAGNOSIS — I480 Paroxysmal atrial fibrillation: Principal | ICD-10-CM | POA: Diagnosis present

## 2015-06-09 DIAGNOSIS — F17210 Nicotine dependence, cigarettes, uncomplicated: Secondary | ICD-10-CM | POA: Diagnosis not present

## 2015-06-09 DIAGNOSIS — R072 Precordial pain: Secondary | ICD-10-CM | POA: Diagnosis not present

## 2015-06-09 DIAGNOSIS — Z8679 Personal history of other diseases of the circulatory system: Secondary | ICD-10-CM | POA: Diagnosis not present

## 2015-06-09 HISTORY — DX: Unspecified asthma, uncomplicated: J45.909

## 2015-06-09 HISTORY — DX: Unspecified atrial flutter: I48.92

## 2015-06-09 HISTORY — DX: Unspecified atrial fibrillation: I48.91

## 2015-06-09 HISTORY — DX: Malignant neoplasm of cervix uteri, unspecified: C53.9

## 2015-06-09 HISTORY — DX: Other hypertrophic cardiomyopathy: I42.2

## 2015-06-09 HISTORY — DX: Chronic obstructive pulmonary disease, unspecified: J44.9

## 2015-06-09 LAB — BASIC METABOLIC PANEL
ANION GAP: 8 (ref 5–15)
BUN: 17 mg/dL (ref 6–20)
CHLORIDE: 103 mmol/L (ref 101–111)
CO2: 26 mmol/L (ref 22–32)
Calcium: 9.1 mg/dL (ref 8.9–10.3)
Creatinine, Ser: 0.73 mg/dL (ref 0.44–1.00)
GFR calc non Af Amer: >60 mL/min (ref >60)
Glucose, Bld: 112 mg/dL — ABNORMAL HIGH (ref 65–99)
POTASSIUM: 3.9 mmol/L (ref 3.5–5.1)
Sodium: 137 mmol/L (ref 135–145)

## 2015-06-09 LAB — CBC
HCT: 42 % (ref 36.0–46.0)
HEMOGLOBIN: 13.7 g/dL (ref 12.0–15.0)
MCH: 29 pg (ref 26.0–34.0)
MCHC: 32.6 g/dL (ref 30.0–36.0)
MCV: 89 fL (ref 78.0–100.0)
Platelets: 329 10*3/uL (ref 150–400)
RBC: 4.72 MIL/uL (ref 3.87–5.11)
RDW: 15.1 % (ref 11.5–15.5)
WBC: 11.3 10*3/uL — AB (ref 4.0–10.5)

## 2015-06-09 LAB — MRSA PCR SCREENING: MRSA by PCR: NEGATIVE

## 2015-06-09 LAB — TROPONIN I: TROPONIN I: 0.04 ng/mL — AB (ref <0.031)

## 2015-06-09 MED ORDER — SODIUM CHLORIDE 0.9 % IJ SOLN
3.0000 mL | Freq: Two times a day (BID) | INTRAMUSCULAR | Status: DC
  Administered 2015-06-09 – 2015-06-12 (×4): 3 mL via INTRAVENOUS

## 2015-06-09 MED ORDER — ONDANSETRON HCL 4 MG/2ML IJ SOLN
INTRAMUSCULAR | Status: AC
  Administered 2015-06-09: 19:00:00 via INTRAMUSCULAR
  Filled 2015-06-09: qty 2

## 2015-06-09 MED ORDER — NITROGLYCERIN 0.4 MG SL SUBL: 0.4000 mg | SUBLINGUAL_TABLET | SUBLINGUAL | Status: DC | PRN

## 2015-06-09 MED ORDER — MORPHINE SULFATE (PF) 4 MG/ML IV SOLN
4.0000 mg | INTRAVENOUS | Status: AC | PRN
  Administered 2015-06-09 (×2): 4 mg via INTRAVENOUS
  Filled 2015-06-09 (×2): qty 1

## 2015-06-09 MED ORDER — ONDANSETRON HCL 4 MG/2ML IJ SOLN
4.0000 mg | Freq: Once | INTRAMUSCULAR | Status: AC
  Administered 2015-06-09: 4 mg via INTRAMUSCULAR

## 2015-06-09 MED ORDER — ACETAMINOPHEN 650 MG RE SUPP: 650.0000 mg | Freq: Four times a day (QID) | RECTAL | Status: DC | PRN

## 2015-06-09 MED ORDER — ASPIRIN 81 MG PO CHEW
324.0000 mg | CHEWABLE_TABLET | Freq: Once | ORAL | Status: AC
  Administered 2015-06-09: 324 mg via ORAL
  Filled 2015-06-09: qty 4

## 2015-06-09 MED ORDER — HEPARIN (PORCINE) IN NACL 100-0.45 UNIT/ML-% IJ SOLN
1150.0000 [IU]/h | INTRAMUSCULAR | Status: AC
  Administered 2015-06-09: 1000 [IU]/h via INTRAVENOUS
  Filled 2015-06-09: qty 250

## 2015-06-09 MED ORDER — DILTIAZEM HCL 100 MG IV SOLR
5.0000 mg/h | INTRAVENOUS | Status: DC
  Administered 2015-06-09: 5 mg/h via INTRAVENOUS

## 2015-06-09 MED ORDER — DILTIAZEM HCL 100 MG IV SOLR
5.0000 mg/h | INTRAVENOUS | Status: DC
  Administered 2015-06-09: 5 mg/h via INTRAVENOUS
  Administered 2015-06-10: 12.5 mg/h via INTRAVENOUS
  Filled 2015-06-09 (×2): qty 100

## 2015-06-09 MED ORDER — ONDANSETRON HCL 4 MG/2ML IJ SOLN: 4.0000 mg | Freq: Four times a day (QID) | INTRAMUSCULAR | Status: DC | PRN

## 2015-06-09 MED ORDER — SODIUM CHLORIDE 0.9 % IV SOLN
INTRAVENOUS | Status: AC
  Administered 2015-06-09: 21:00:00 via INTRAVENOUS

## 2015-06-09 MED ORDER — ONDANSETRON HCL 4 MG PO TABS: 4.0000 mg | ORAL_TABLET | Freq: Four times a day (QID) | ORAL | Status: DC | PRN

## 2015-06-09 MED ORDER — HEPARIN BOLUS VIA INFUSION
4000.0000 [IU] | Freq: Once | INTRAVENOUS | Status: AC
  Administered 2015-06-09: 4000 [IU] via INTRAVENOUS
  Filled 2015-06-09: qty 4000

## 2015-06-09 MED ORDER — ACETAMINOPHEN 325 MG PO TABS: 650.0000 mg | ORAL_TABLET | Freq: Four times a day (QID) | ORAL | Status: DC | PRN

## 2015-06-09 MED ORDER — SODIUM CHLORIDE 0.9 % IV SOLN
INTRAVENOUS | Status: DC
  Administered 2015-06-10: 08:00:00 via INTRAVENOUS

## 2015-06-09 MED ORDER — DILTIAZEM LOAD VIA INFUSION
10.0000 mg | Freq: Once | INTRAVENOUS | Status: AC
  Administered 2015-06-09: 10 mg via INTRAVENOUS
  Filled 2015-06-09: qty 10

## 2015-06-09 MED ORDER — HYDROCODONE-ACETAMINOPHEN 5-325 MG PO TABS: 1.0000 | ORAL_TABLET | ORAL | Status: DC | PRN

## 2015-06-09 MED ORDER — MORPHINE SULFATE (PF) 2 MG/ML IV SOLN: 1.0000 mg | INTRAVENOUS | Status: DC | PRN

## 2015-06-09 NOTE — ED Notes (Signed)
Pt reports heart rate "is going up and down" today. Denies chest pain currently.

## 2015-06-09 NOTE — Progress Notes (Signed)
ANTICOAGULATION CONSULT NOTE - Initial Consult  Pharmacy Consult for heparin Indication: atrial fibrillation  Patient Measurements: Height: 5\' 1"  (154.9 cm) Weight: 195 lb (88.451 kg) IBW/kg (Calculated) : 47.8 Heparin Dosing Weight: 69  Vital Signs: Temp: 98 F (36.7 C) (12/27 1915) BP: 119/87 mmHg (12/27 1915) Pulse Rate: 116 (12/27 1915)  Labs:  Recent Labs  06/09/15 1613  HGB 13.7  HCT 42.0  PLT 329  CREATININE 0.73  TROPONINI 0.04*    Assessment: 49 yo female who presented to ED on 12/27 with complaints of palpitations and chest heaviness. Found to be in aflutter with ST changes but is not to be a STEMI. Pharmacy consulted for dosing of heparin for aflutter. No anticoagulation PTA, H/H stable.   Goal of Therapy:  Heparin level 0.3-0.7 units/ml Monitor platelets by anticoagulation protocol: Yes   Plan:  1. Give 4000 units bolus x 1 2. Start heparin infusion at 1000 units/hr 3. Check anti-Xa level in 8 hours and daily while on heparin 4. Continue to monitor H&H and platelets  Vincenza Hews, PharmD, BCPS 06/09/2015, 8:25 PM Pager: [phone]

## 2015-06-09 NOTE — H&P (Signed)
[facility] History and Physical  Kathleen Wright [id number] DOB: 03-19-1966 DOA: 06/09/2015  Referring physician: Elise Benne PCP: [name]., MD   Chief Complaint: Palpitations and chest heaviness  HPI: Kathleen Wright is a 49 y.o. female with PMI, COPD and transient postoperative atrial fibrillation back in 2006, came in to the hospital complaining about palpitations and chest heaviness. She had 2 episodes in the past week. This morning it was intense so she decided to come to the hospital. Mainly she had palpitations with heart rate going up and down, substernal chest heaviness without radiation, mild pain. She denies any fever, chills, cough or other symptoms. In the ED CXR showed no acute events, blood work w blood work CBC/BMP ithin normal limits, slightly elevated troponin of 0.04, EKG showed 2-1 atrial flutter, patient has significant EKG changes including ST elevation in V1/V2 and ST depression and V4/V5 and V6, this is discussed with Dr. Gypsy Balsam of cardiology, this appears to be secondary to the previous surgery and likely not to be present STEMI.  Review of Systems:  Constitutional: negative for anorexia, fevers and sweats Eyes: negative for irritation, redness and visual disturbance Ears, nose, mouth, throat, and face: negative for earaches, epistaxis, nasal congestion and sore throat Respiratory: negative for cough, dyspnea on exertion, sputum and wheezing Cardio: Complains about palpitations ointestinal: negative for abdominal pain, constipation, diarrhea, melena, nausea and vomiting Genitourinary:negative for dysuria, frequency and hematuria Hematologic/lymphatic: negative for bleeding, easy bruising and lymphadenopathy Musculoskeletal:negative for arthralgias, muscle weakness and stiff joints Neurological: negative for coordination problems, gait problems, headaches and weakness Endocrine: negative for diabetic symptoms including polydipsia, polyuria and weight  loss Allergic/Immunologic: negative for anaphylaxis, hay fever and urticaria  Past Medical History  Diagnosis Date  . COPD (chronic obstructive pulmonary disease) ([place])   . Enlarged heart   . Hypertrophic cardiomyopathy ([place])   . Asthma   . Transient atrial fibrillation ([place]) 2006   Past Surgical History  Procedure Laterality Date  . Cardiac surgery  2006    septal myectomy  . Tubal ligation     Social History:   reports that she has been smoking Cigarettes.  She has been smoking about 0.50 packs per day. She does not have any smokeless tobacco history on file. She reports that she does not drink alcohol. Her drug history is not on file.  Allergies  Allergen Reactions  . Amiodarone Other (See Comments)    Liver function    Family history She has NO family history of HOCM or sudden cardiac death   Prior to Admission medications   Medication Sig Start Date End Date Taking? Authorizing Provider  albuterol (PROVENTIL) 2 MG tablet Take 2 mg by mouth daily as needed for wheezing or shortness of breath.  04/21/15  Yes Historical Provider, MD  ALPRAZolam Duanne Moron) 1 MG tablet Take 1 mg by mouth 3 (three) times daily as needed for anxiety.  05/27/15  Yes Historical Provider, MD  HYDROcodone-acetaminophen (NORCO) 10-325 MG tablet Take 1 tablet by mouth every 6 (six) hours as needed for moderate pain or severe pain.  05/27/15  Yes Historical Provider, MD  ibuprofen (ADVIL,MOTRIN) 800 MG tablet Take 1 tablet by mouth 3 (three) times daily as needed for mild pain or moderate pain.  05/06/15  Yes Historical Provider, MD  PROAIR HFA 108 (90 Base) MCG/ACT inhaler Inhale 1-2 puffs into the lungs every 6 (six) hours as needed for wheezing or shortness of breath.  05/27/15  Yes Historical Provider, MD   Physical  Exam: Filed Vitals:   06/09/15 1700 06/09/15 1730  BP: 110/74 108/77  Pulse: 115 118  Resp: 23 23   Constitutional: Oriented to person, place, and time. Well-developed and  well-nourished. Cooperative.  Head: Normocephalic and atraumatic.  Nose: Nose normal.  Mouth/Throat: Uvula is midline, oropharynx is clear and moist and mucous membranes are normal.  Eyes: Conjunctivae and EOM are normal. Pupils are equal, round, and reactive to light.  Neck: Trachea normal and normal range of motion. Neck supple.  Cardiovascular: Normal rate, regular rhythm, S1 normal, S2 normal, normal heart sounds and intact distal pulses.   Pulmonary/Chest: Effort norm, she has mild bilateral wheezes area Abdominal: Soft. Bowel sounds are normal. There is no hepatosplenomegaly. There is no tenderness.  Musculoskeletal: Normal range of motion.  Neurological: Alert and oriented to person, place, and time. Has normal strength. No cranial nerve deficit or sensory deficit.  Skin: Skin is warm, dry and intact.  Psychiatric: Has a normal mood and affect. Speech is normal and behavior is normal.   Labs on Admission:  Basic Metabolic Panel:  Recent Labs Lab 06/09/15 1613  NA 137  K 3.9  CL 103  CO2 26  GLUCOSE 112*  BUN 17  CREATININE 0.73  CALCIUM 9.1   Liver Function Tests: No results for input(s): AST, ALT, ALKPHOS, BILITOT, PROT, ALBUMIN in the last 168 hours. No results for input(s): LIPASE, AMYLASE in the last 168 hours. No results for input(s): AMMONIA in the last 168 hours. CBC:  Recent Labs Lab 06/09/15 1613  WBC 11.3*  HGB 13.7  HCT 42.0  MCV 89.0  PLT 329   Cardiac Enzymes:  Recent Labs Lab 06/09/15 1613  TROPONINI 0.04*    BNP (last 3 results) No results for input(s): BNP in the last 8760 hours.  ProBNP (last 3 results) No results for input(s): PROBNP in the last 8760 hours.  CBG: No results for input(s): GLUCAP in the last 168 hours.  Radiological Exams on Admission: Dg Chest 2 View  06/09/2015  CLINICAL DATA:  Pt reports heart rate "is going up and down" today. Denies chest pain currently. Pt denies injury or sob. Pt states she had open heart  surgery in 2006. EXAM: CHEST  2 VIEW COMPARISON:  09/03/2013 FINDINGS: Mild hyperinflation. Midline trachea. Mild cardiomegaly. Prior median sternotomy. No pleural effusion or pneumothorax. No congestive failure. Clear lungs. IMPRESSION: Mild cardiomegaly and hyperinflation. No acute superimposed process. Electronically Signed   By: Abigail Miyamoto M.D.   On: 06/09/2015 15:31    EKG: Independently reviewed.   Assessment/Plan Principal Problem:   Tachycardia Active Problems:   Chest pain   History of ventricular septal myectomy   Palpitations    Tachycardia, atrial flutter This is likely atrial flutter, with 2-1 conduction. As mentioned above EKG has significant ST-T changes. This is discussed with cardiology, patient will be transferred to [facility], cardiac stepdown for further close monitoring. Placed on diltiazem and heparin drip. Anyway her CHA2DS2-VASc score is 0.  Chest pain She has atypical chest pain, its substernal but comes and goes likely related to the tachycardia/palpitations. Twelve-lead EKG showed ST elevation/depression in multiple leads, this is seen by cardiology and it's unlikely to be STEMI. She probably has some EKG changes post myectomy done in 2006.   troponin of 0.04 slightly elevated, likely related to the rate.  COPD Patient smokes half to 1 pack per day since she is 49 years old. This is stable, mild scattered wheezing on forced expiration without evidence  of acute exacerbation.  History of ventricular septal myectomy She has history of HOCM, treated at [facility] with myectomy in 2006. Please note she had postoperative atrial fibrillation which was treated with cardioversion.   Code Status:full code Family Communication:  and discussed with the patient in the presence of her husband at bedside.  Disposition: stepdown, [place].   spent: 70 minutes  Verlee Monte A, MD [facility] Pager [phone]

## 2015-06-09 NOTE — ED Notes (Signed)
Report given to Baldwin Jamaica, RN for [phone].

## 2015-06-09 NOTE — ED Provider Notes (Signed)
CSN: [id number]     Arrival date & time 06/09/15  1425 History   First MD Initiated Contact with Patient 06/09/15 1555     Chief Complaint  Patient presents with  . Irregular Heart Beat  . Chest Pain     HPI  Pt was seen at 1555. Per pt, c/o gradual onset and resolution of multiple intermittent episodes of CP and palpitations that began this morning. 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 Pt then states approximately 1 to 2 hours later she experienced similar symptoms, but was associated with "my heart rate going up and down." This lasted approximately 62min before spontaneously resolving. These symptoms occurred several more times before she decided to come to the ED for evaluation. Pt endorses hx of hypertrophic cardiomyopathy with transient afib/aflutter after myectomy in 2006. Pt states she "was on cardizem for a while" but "stopped it about 5 years ago." Denies SOB/cough, no abd pain, no N/V/D, no back pain, no fevers.     Past Medical History  Diagnosis Date  . COPD (chronic obstructive pulmonary disease) ([place])   . Enlarged heart   . Hypertrophic cardiomyopathy ([place])   . Asthma   . Transient atrial fibrillation ([place]) 2006   Past Surgical History  Procedure Laterality Date  . Cardiac surgery  2006    septal myectomy  . Tubal ligation      Social History  Substance Use Topics  . Smoking status: Current Every Day Smoker -- 0.50 packs/day    Types: Cigarettes  . Smokeless tobacco: Not on file  . Alcohol Use: No    Review of Systems ROS: Statement: All systems negative except as marked or noted in the HPI; Constitutional: Negative for fever and chills. ; ; Eyes: Negative for eye pain, redness and discharge. ; ; ENMT: Negative for ear pain, hoarseness, nasal congestion, sinus pressure and sore throat. ; ;  Cardiovascular: +CP, palpitations. Negative for diaphoresis, dyspnea and peripheral edema. ; ; Respiratory: Negative for cough, wheezing and stridor. ; ; Gastrointestinal: Negative for nausea, vomiting, diarrhea, abdominal pain, blood in stool, hematemesis, jaundice and rectal bleeding. . ; ; Genitourinary: Negative for dysuria, flank pain and hematuria. ; ; Musculoskeletal: Negative for back pain and neck pain. Negative for swelling and trauma.; ; Skin: Negative for pruritus, rash, abrasions, blisters, bruising and skin lesion.; ; Neuro: Negative for headache, lightheadedness and neck stiffness. Negative for weakness, altered level of consciousness , altered mental status, extremity weakness, paresthesias, involuntary movement, seizure and syncope.      Allergies  Amiodarone  Home Medications   Prior to Admission medications   Medication Sig Start Date End Date Taking? Authorizing Provider  albuterol (PROVENTIL) 2 MG tablet Take 2 mg by mouth daily as needed for wheezing or shortness of breath.  04/21/15  Yes Historical Provider, MD  ALPRAZolam Duanne Moron) 1 MG tablet Take 1 mg by mouth 3 (three) times daily as needed for anxiety.  05/27/15  Yes Historical Provider, MD  HYDROcodone-acetaminophen (NORCO) 10-325 MG tablet Take 1 tablet by mouth every 6 (six) hours as needed for moderate pain or severe pain.  05/27/15  Yes Historical Provider, MD  ibuprofen (ADVIL,MOTRIN) 800 MG tablet Take 1 tablet by mouth 3 (three) times daily as needed for mild pain or moderate pain.  05/06/15  Yes Historical Provider, MD  [employer]  HFA 108 (90 Base) MCG/ACT inhaler Inhale 1-2 puffs into the lungs every 6 (six) hours as needed for wheezing or shortness of breath.  05/27/15  Yes Historical Provider, MD   BP 133/85 mmHg  Pulse 114  Resp 25  SpO2 100% Physical Exam  1600: Physical examination:  Nursing notes reviewed; Vital signs and O2 SAT reviewed;  Constitutional: Well developed, Well nourished, Well hydrated, In no  acute distress; Head:  Normocephalic, atraumatic; Eyes: EOMI, PERRL, No scleral icterus; ENMT: Mouth and pharynx normal, Mucous membranes moist; Neck: Supple, Full range of motion, No lymphadenopathy; Cardiovascular: Tachycardic rate and rhythm, No gallop; Respiratory: Breath sounds clear & equal bilaterally, No wheezes.  Speaking full sentences with ease, Normal respiratory effort/excursion; Chest: Nontender, Movement normal; Abdomen: Soft, Nontender, Nondistended, Normal bowel sounds; Genitourinary: No CVA tenderness; Extremities: Pulses normal, No tenderness, No edema, No calf edema or asymmetry.; Neuro: AA&Ox3, Major CN grossly intact.  Speech clear. No gross focal motor or sensory deficits in extremities.; Skin: Color normal, Warm, Dry.   ED Course  Procedures (including critical care time) Labs Review Imaging Review I have personally reviewed and evaluated these images and lab results as part of my medical decision-making.   EKG Interpretation  Date/Time:  Tuesday June 09 2015 17:04:49 EST   Repeat EKG, leads misplaced Ventricular Rate:  115 PR Interval:  137 QRS Duration: 122 QT Interval:  442 QTC Calculation: 611 R Axis:   141 Text Interpretation:  Ectopic atrial tachycardia, unifocal Atrial  premature complex Nonspecific intraventricular conduction delay Abnormal  lateral Q waves Abnormal T, consider ischemia, lateral leads Leads  misplaced SUGGEST REPEAT TRACING Confirmed by [facility]  MD, Marquail Bradwell  [phone]) on 06/09/2015 5:57:40 PM      EKG Interpretation  Date/Time:  Tuesday June 09 2015 17:12:17 EST  Repeat EKG Ventricular Rate:  114 PR Interval:  137 QRS Duration: 123 QT Interval:  338 QTC Calculation: 465 R Axis:   38 Text Interpretation:  Atrial flutter with predominant 2:1 AV block IVCD, consider atypical LBBB Atrial flutter has replaced Sinus tachycardia Since last tracing of earlier today Confirmed by [facility]  MD, Nunzio Cory [phone]) on 06/09/2015 5:58:46 PM         MDM  MDM Reviewed: previous chart, nursing note and vitals Reviewed previous: labs and ECG Interpretation: labs, ECG and x-ray Total time providing critical care: 30-74 minutes. This excludes time spent performing separately reportable procedures and services. Consults: admitting MD and cardiology   CRITICAL CARE Performed by: Alfonzo Feller Total critical care time: 35 minutes Critical care time was exclusive of separately billable procedures and treating other patients. Critical care was necessary to treat or prevent imminent or life-threatening deterioration. Critical care was time spent personally by me on the following activities: development of treatment plan with patient and/or surrogate as well as nursing, discussions with consultants, evaluation of patient's response to treatment, examination of patient, obtaining history from patient or surrogate, ordering and performing treatments and interventions, ordering and review of laboratory studies, ordering and review of radiographic studies, pulse oximetry and re-evaluation of patient's condition.     ED ECG REPORT   Date: 06/09/2015 EKG on arrival 1434  Rate: 116  Rhythm: sinus tachycardia  QRS Axis: normal  Intervals: normal  ST/T Wave abnormalities: nonspecific ST/T changes Lateral leads  Conduction Disutrbances:none  Narrative Interpretation:   Old EKG Reviewed: none available   Results for orders placed or performed during the hospital encounter of [id number]  Basic metabolic panel  Result Value Ref  Range   Sodium 137 135 - 145 mmol/L   Potassium 3.9 3.5 - 5.1 mmol/L   Chloride 103 101 - 111 mmol/L   CO2 26 22 - 32 mmol/L   Glucose, Bld 112 (H) 65 - 99 mg/dL   BUN 17 6 - 20 mg/dL   Creatinine, Ser 0.73 0.44 - 1.00 mg/dL   Calcium 9.1 8.9 - 10.3 mg/dL   GFR calc non Af Amer >60 >60 mL/min   GFR calc Af Amer >60 >60 mL/min   Anion gap 8 5 - 15  CBC  Result Value Ref Range   WBC 11.3 (H) 4.0 - 10.5  K/uL   RBC 4.72 3.87 - 5.11 MIL/uL   Hemoglobin 13.7 12.0 - 15.0 g/dL   HCT 42.0 36.0 - 46.0 %   MCV 89.0 78.0 - 100.0 fL   MCH 29.0 26.0 - 34.0 pg   MCHC 32.6 30.0 - 36.0 g/dL   RDW 15.1 11.5 - 15.5 %   Platelets 329 150 - 400 K/uL  Troponin I  Result Value Ref Range   Troponin I 0.04 (H) <0.031 ng/mL   Dg Chest 2 View 06/09/2015  CLINICAL DATA:  Pt reports heart rate "is going up and down" today. Denies chest pain currently. Pt denies injury or sob. Pt states she had open heart surgery in 2006. EXAM: CHEST  2 VIEW COMPARISON:  09/03/2013 FINDINGS: Mild hyperinflation. Midline trachea. Mild cardiomegaly. Prior median sternotomy. No pleural effusion or pneumothorax. No congestive failure. Clear lungs. IMPRESSION: Mild cardiomegaly and hyperinflation. No acute superimposed process. Electronically Signed   By: Abigail Miyamoto M.D.   On: 06/09/2015 15:31    1710:  ASA and IV morphine given with improvement in chest "heaviness." EKG repeated by ED staff; appears to have leads misplaced.  T/C to [facility] Dr. Claiborne Billings, case discussed, including:  HPI, pertinent PM/SHx, VS/PE, dx testing, ED course and treatment:  Agrees to repeat EKG d/t possible leads misplacement, call back prn.   1735:  EKG repeated (limb leads were misplaced per ED RN). No acute ST elevation, but pt now in aflutter. HR currently 110-120's on monitor; will start IV cardizem. Troponin mildly elevated.  T/C to [facility] Dr. Hartford Poli, case discussed, including:  HPI, pertinent PM/SHx, VS/PE, dx testing, ED course and treatment:  Agreeable to admit, requests to call Cards MD at [facility] to see if they would like her transferred there.  T/C to [facility] Cards Dr. Marlou Porch, case discussed, including:  HPI, pertinent PM/SHx, VS/PE, dx testing, ED course and treatment:  Agrees pt is not an acute STEMI at this time, recent EKG does appear to be aflutter, this may be the source of pt's chest discomfort, OK to transfer to [facility] for admission to [facility] service and  Cards will consult. [facility] Dr. Hartford Poli updated. Dx and testing d/w pt and family.  Questions answered.  Verb understanding, agreeable to transfer/admit to [facility].      Francine Graven, DO 06/11/15 2026

## 2015-06-09 NOTE — ED Notes (Signed)
Report given to Micheal with [facility] at this time.

## 2015-06-10 ENCOUNTER — Encounter (HOSPITAL_COMMUNITY): Payer: Self-pay | Admitting: Physician Assistant

## 2015-06-10 ENCOUNTER — Inpatient Hospital Stay (HOSPITAL_COMMUNITY): Payer: Medicare Other

## 2015-06-10 DIAGNOSIS — R002 Palpitations: Secondary | ICD-10-CM

## 2015-06-10 DIAGNOSIS — Z720 Tobacco use: Secondary | ICD-10-CM

## 2015-06-10 DIAGNOSIS — I480 Paroxysmal atrial fibrillation: Secondary | ICD-10-CM | POA: Diagnosis not present

## 2015-06-10 DIAGNOSIS — R778 Other specified abnormalities of plasma proteins: Secondary | ICD-10-CM

## 2015-06-10 DIAGNOSIS — I421 Obstructive hypertrophic cardiomyopathy: Secondary | ICD-10-CM

## 2015-06-10 DIAGNOSIS — R079 Chest pain, unspecified: Secondary | ICD-10-CM

## 2015-06-10 DIAGNOSIS — R072 Precordial pain: Secondary | ICD-10-CM

## 2015-06-10 DIAGNOSIS — Z8541 Personal history of malignant neoplasm of cervix uteri: Secondary | ICD-10-CM | POA: Diagnosis not present

## 2015-06-10 DIAGNOSIS — I4892 Unspecified atrial flutter: Secondary | ICD-10-CM | POA: Diagnosis not present

## 2015-06-10 DIAGNOSIS — Z9889 Other specified postprocedural states: Secondary | ICD-10-CM

## 2015-06-10 DIAGNOSIS — J449 Chronic obstructive pulmonary disease, unspecified: Secondary | ICD-10-CM | POA: Diagnosis not present

## 2015-06-10 DIAGNOSIS — Z79899 Other long term (current) drug therapy: Secondary | ICD-10-CM | POA: Diagnosis not present

## 2015-06-10 DIAGNOSIS — I484 Atypical atrial flutter: Secondary | ICD-10-CM

## 2015-06-10 DIAGNOSIS — J45909 Unspecified asthma, uncomplicated: Secondary | ICD-10-CM | POA: Diagnosis present

## 2015-06-10 DIAGNOSIS — R7989 Other specified abnormal findings of blood chemistry: Secondary | ICD-10-CM | POA: Diagnosis not present

## 2015-06-10 DIAGNOSIS — Z8679 Personal history of other diseases of the circulatory system: Secondary | ICD-10-CM | POA: Diagnosis not present

## 2015-06-10 DIAGNOSIS — F17210 Nicotine dependence, cigarettes, uncomplicated: Secondary | ICD-10-CM | POA: Diagnosis present

## 2015-06-10 DIAGNOSIS — I248 Other forms of acute ischemic heart disease: Secondary | ICD-10-CM | POA: Diagnosis not present

## 2015-06-10 LAB — CBC
HCT: 40.2 % (ref 36.0–46.0)
HEMOGLOBIN: 12.8 g/dL (ref 12.0–15.0)
MCH: 28.4 pg (ref 26.0–34.0)
MCHC: 31.8 g/dL (ref 30.0–36.0)
MCV: 89.3 fL (ref 78.0–100.0)
PLATELETS: 291 10*3/uL (ref 150–400)
RBC: 4.5 MIL/uL (ref 3.87–5.11)
RDW: 15.3 % (ref 11.5–15.5)
WBC: 10.9 10*3/uL — AB (ref 4.0–10.5)

## 2015-06-10 LAB — HEPARIN LEVEL (UNFRACTIONATED)
HEPARIN UNFRACTIONATED: 0.44 [IU]/mL (ref 0.30–0.70)
HEPARIN UNFRACTIONATED: 0.24 [IU]/mL — AB (ref 0.30–0.70)

## 2015-06-10 LAB — BASIC METABOLIC PANEL
ANION GAP: 6 (ref 5–15)
BUN: 16 mg/dL (ref 6–20)
CHLORIDE: 106 mmol/L (ref 101–111)
CO2: 29 mmol/L (ref 22–32)
CREATININE: 0.83 mg/dL (ref 0.44–1.00)
Calcium: 8.6 mg/dL — ABNORMAL LOW (ref 8.9–10.3)
GFR calc non Af Amer: >60 mL/min (ref >60)
Glucose, Bld: 120 mg/dL — ABNORMAL HIGH (ref 65–99)
Potassium: 4.5 mmol/L (ref 3.5–5.1)
SODIUM: 141 mmol/L (ref 135–145)

## 2015-06-10 LAB — TROPONIN I
TROPONIN I: 0.05 ng/mL — AB (ref <0.031)
Troponin I: 0.04 ng/mL — ABNORMAL HIGH (ref <0.031)

## 2015-06-10 LAB — TSH: TSH: 1.064 u[IU]/mL (ref 0.350–4.500)

## 2015-06-10 MED ORDER — DILTIAZEM HCL 60 MG PO TABS
60.0000 mg | ORAL_TABLET | Freq: Four times a day (QID) | ORAL | Status: DC
  Administered 2015-06-10 (×2): 60 mg via ORAL
  Filled 2015-06-10 (×2): qty 1

## 2015-06-10 MED ORDER — LEVALBUTEROL HCL 0.63 MG/3ML IN NEBU
0.6300 mg | INHALATION_SOLUTION | Freq: Four times a day (QID) | RESPIRATORY_TRACT | Status: DC | PRN
  Administered 2015-06-10 (×2): 0.63 mg via RESPIRATORY_TRACT
  Filled 2015-06-10 (×2): qty 3

## 2015-06-10 MED ORDER — RIVAROXABAN 20 MG PO TABS
20.0000 mg | ORAL_TABLET | Freq: Every day | ORAL | Status: DC
  Administered 2015-06-10 – 2015-06-11 (×2): 20 mg via ORAL
  Filled 2015-06-10 (×2): qty 1

## 2015-06-10 MED ORDER — DILTIAZEM HCL 60 MG PO TABS
75.0000 mg | ORAL_TABLET | Freq: Four times a day (QID) | ORAL | Status: DC
  Administered 2015-06-11 (×5): 75 mg via ORAL
  Filled 2015-06-10 (×2): qty 0.5
  Filled 2015-06-10: qty 1
  Filled 2015-06-10 (×4): qty 0.5
  Filled 2015-06-10: qty 1
  Filled 2015-06-10 (×2): qty 0.5
  Filled 2015-06-10: qty 1
  Filled 2015-06-10 (×2): qty 0.5
  Filled 2015-06-10: qty 1

## 2015-06-10 MED ORDER — DILTIAZEM HCL 30 MG PO TABS
15.0000 mg | ORAL_TABLET | Freq: Once | ORAL | Status: AC
  Administered 2015-06-10: 15 mg via ORAL
  Filled 2015-06-10: qty 1

## 2015-06-10 NOTE — Progress Notes (Signed)
TEE schedule full tomorrow. TEE/DCCV scheduled tentatively for 11am on Friday. If she is still in atrial flutter tomorrow, will need to write final orders for the TEE/DCCV. OK to go to telemetry from cardiology standpoint. Will ask nurse to let primary team know.  Jencarlo Bonadonna PA-C

## 2015-06-10 NOTE — Discharge Instructions (Signed)

## 2015-06-10 NOTE — Progress Notes (Signed)
[facility]                                                                              Patient Demographics  Kathleen Wright, is a 49 y.o. female, DOB - Jul 24, 1965, [id number]  Admit date - 06/09/2015   Admitting Physician Kathleen Monte, MD  Outpatient Primary MD for the patient is Kathleen Wright., MD  LOS - 0   Chief Complaint  Patient presents with  . Irregular Heart Beat  . Chest Pain      HPI on 06/09/2015 by Dr. Verlee Wright Kathleen Wright is a 49 y.o. female with PMI, COPD and transient postoperative atrial fibrillation back in 2006, came in to the hospital complaining about palpitations and chest heaviness. She had 2 episodes in the past week. This morning it was intense so she decided to come to the hospital. Mainly she had palpitations with heart rate going up and down, substernal chest heaviness without radiation, mild pain. She denies any fever, chills, cough or other symptoms. In the ED CXR showed no acute events, blood work w blood work CBC/BMP ithin normal limits, slightly elevated troponin of 0.04, EKG showed 2-1 atrial flutter, patient has significant EKG changes including ST elevation in V1/V2 and ST depression and V4/V5 and V6, this is discussed with Dr. Gypsy Wright of cardiology, this appears to be secondary to the previous surgery and likely not to be present STEMI.  Assessment & Plan   Paroxysmal atrial fibrillation -CHADSVASC 1 (based on age) -Recurrent, supposedly occurred years ago after myomectomy. Patient was also taking amiodarone but developed hepatotoxicity. -Patient admits to having a rapid heart rate 6 weeks ago however did not seek care -Patient has not seen a cardiologist within [facility] -Echocardiogram: EF 6065% left Atrium moderately dilated, LVH, no defect or PFO noted -Currently on heparin drip as well as Cardizem drip, blood pressures are low -Cardiology consult appreciated, pending further recommendations  Chest pressure with mild  elevated troponin -Likely secondary to the above -Troponin 0.04/0.05 -Continue heparin drip  COPD -Currently compensated -Continue nebs PRN  Tobacco abuse -Smoking cessation discussed  History of ventricular septal myomectomy -History of HOCM, treated at [facility] in 2006.  Patient to have postoperative atrial fibrillation which was treated with cardioversion at that time  Code Status: Full  Family Communication: None at bedside  Disposition Plan: Admitted  Time Spent in minutes   30 minutes  Procedures  Echocardiogram  Consults   Cardiology  DVT Prophylaxis  heparin  Lab Results  Component Value Date   PLT 291 06/10/2015    Medications  Scheduled Meds: . diltiazem  60 mg Oral 4 times per day  . sodium chloride  3 mL Intravenous Q12H   Continuous Infusions: . sodium chloride 10 mL/hr at 06/10/15 0743  . heparin 1,000 Units/hr (06/09/15 2126)   PRN Meds:.acetaminophen **OR** acetaminophen, HYDROcodone-acetaminophen, levalbuterol, morphine injection, nitroGLYCERIN, ondansetron **OR** ondansetron (ZOFRAN) IV  Antibiotics    Anti-infectives    None      Subjective:   Kathleen Wright seen and examined today.  Patient currently denies chest pain or shortness of breath.  Has seen a cardiologist at [facility] in the past and has a history  of an irregular heart beat.  States her blood pressure always runs low.  Denies dizziness, headache, abdominal pain.    Objective:   Filed Vitals:   06/10/15 0700 06/10/15 0800 06/10/15 0804 06/10/15 0810  BP: 88/58 90/69    Pulse: 126 125 88 88  Temp: 97.7 F (36.5 C)     TempSrc: Oral     Resp: 17 18 24 16   Height:      Weight:      SpO2: 98% 97% 95% 98%    Wt Readings from Last 3 Encounters:  06/09/15 97.7 kg (215 lb 6.2 oz)     Intake/Output Summary (Last 24 hours) at 06/10/15 1022 Last data filed at 06/10/15 [id number]  Gross per 24 hour  Intake 517.27 ml  Output    300 ml  Net 217.27 ml    Exam  General:  Well developed, well nourished, NAD, appears stated age  57: NCAT, mucous membranes moist.   Neck: Supple, no JVD, no masses  Cardiovascular: S1 S2 auscultated, irregularly irregular  Respiratory: Clear to auscultation bilaterally with equal chest rise  Abdomen: Soft, nontender, nondistended, + bowel sounds  Extremities: warm dry without cyanosis clubbing or edema  Neuro: AAOx3, nonfocal  Skin: Without rashes exudates or nodules  Psych: Normal affect and demeanor with intact judgement and insight  Data Review   Micro Results Recent Results (from the past 240 hour(s))  MRSA PCR Screening     Status: None   Collection Time: 06/09/15  8:06 PM  Result Value Ref Range Status   MRSA by PCR NEGATIVE NEGATIVE Final    Comment:        The GeneXpert MRSA Assay (FDA approved for NASAL specimens only), is one component of a comprehensive MRSA colonization surveillance program. It is not intended to diagnose MRSA infection nor to guide or monitor treatment for MRSA infections.     Radiology Reports Dg Chest 2 View  06/09/2015  CLINICAL DATA:  Pt reports heart rate "is going up and down" today. Denies chest pain currently. Pt denies injury or sob. Pt states she had open heart surgery in 2006. EXAM: CHEST  2 VIEW COMPARISON:  09/03/2013 FINDINGS: Mild hyperinflation. Midline trachea. Mild cardiomegaly. Prior median sternotomy. No pleural effusion or pneumothorax. No congestive failure. Clear lungs. IMPRESSION: Mild cardiomegaly and hyperinflation. No acute superimposed process. Electronically Signed   By: Kathleen Wright M.D.   On: 06/09/2015 15:31    CBC  Recent Labs Lab 06/09/15 1613 06/10/15 0218  WBC 11.3* 10.9*  HGB 13.7 12.8  HCT 42.0 40.2  PLT 329 291  MCV 89.0 89.3  MCH 29.0 28.4  MCHC 32.6 31.8  RDW 15.1 15.3    Chemistries   Recent Labs Lab 06/09/15 1613 06/10/15 0218  NA 137 141  K 3.9 4.5  CL 103 106  CO2 26 29  GLUCOSE 112* 120*  BUN 17 16    CREATININE 0.73 0.83  CALCIUM 9.1 8.6*   ------------------------------------------------------------------------------------------------------------------ estimated creatinine clearance is 86.8 mL/min (by C-G formula based on Cr of 0.83). ------------------------------------------------------------------------------------------------------------------ No results for input(s): HGBA1C in the last 72 hours. ------------------------------------------------------------------------------------------------------------------ No results for input(s): CHOL, HDL, LDLCALC, TRIG, CHOLHDL, LDLDIRECT in the last 72 hours. ------------------------------------------------------------------------------------------------------------------  Recent Labs  06/10/15 0218  TSH 1.064   ------------------------------------------------------------------------------------------------------------------ No results for input(s): VITAMINB12, FOLATE, FERRITIN, TIBC, IRON, RETICCTPCT in the last 72 hours.  Coagulation profile No results for input(s): INR, PROTIME in the last 168 hours.  No results for input(s): DDIMER  in the last 72 hours.  Cardiac Enzymes  Recent Labs Lab 06/09/15 1613 06/10/15 0218 06/10/15 0755  TROPONINI 0.04* 0.05* 0.04*   ------------------------------------------------------------------------------------------------------------------ Invalid input(s): POCBNP    Pippa Hanif D.O. on 06/10/2015 at 10:22 AM  Between 7am to 7pm - Pager - [phone]  After 7pm go to [email or web address] - password [email or web address]  And look for the night coverage person covering for me after hours  [facility] Office  [phone]

## 2015-06-10 NOTE — Progress Notes (Signed)
ANTICOAGULATION CONSULT NOTE - Follow-up Consult  Pharmacy Consult for heparin Indication: atrial fibrillation  Patient Measurements: Height: 5\' 1"  (154.9 cm) Weight: 215 lb 6.2 oz (97.7 kg) IBW/kg (Calculated) : 47.8 Heparin Dosing Weight: 69  Vital Signs: Temp: 97.4 F (36.3 C) (12/28 0300) Temp Source: Oral (12/28 0300) BP: 97/71 mmHg (12/28 0400) Pulse Rate: 121 (12/28 0400)  Labs:  Recent Labs  06/09/15 1613 06/10/15 0218  HGB 13.7 12.8  HCT 42.0 40.2  PLT 329 291  HEPARINUNFRC  --  0.44  CREATININE 0.73 0.83  TROPONINI 0.04* 0.05*    Assessment: 49 yo female on heparin for aflutter. Heparin level therapeutic on 1000 units/hr. No bleeding noted.  Goal of Therapy:  Heparin level 0.3-0.7 units/ml Monitor platelets by anticoagulation protocol: Yes   Plan:  Continue heparin at 1000 units/hr F/u 6 hr confirmatory level  Sherlon Handing, PharmD, BCPS Clinical pharmacist, pager [phone] 06/10/2015, 4:29 AM

## 2015-06-10 NOTE — Progress Notes (Signed)
ANTICOAGULATION CONSULT NOTE - Follow Up Consult  Pharmacy Consult for heparin Indication: atrial fibrillation  Allergies  Allergen Reactions  . Amiodarone Other (See Comments)    Liver function    Patient Measurements: Height: 5\' 1"  (154.9 cm) Weight: 215 lb 6.2 oz (97.7 kg) IBW/kg (Calculated) : 47.8 Heparin Dosing Weight: 69 kg  Vital Signs: Temp: 97.7 F (36.5 C) (12/28 0700) Temp Source: Oral (12/28 0700) BP: 90/69 mmHg (12/28 0800) Pulse Rate: 88 (12/28 0810)  Labs:  Recent Labs  06/09/15 1613 06/10/15 0218 06/10/15 0755 06/10/15 0934  HGB 13.7 12.8  --   --   HCT 42.0 40.2  --   --   PLT 329 291  --   --   HEPARINUNFRC  --  0.44  --  0.24*  CREATININE 0.73 0.83  --   --   TROPONINI 0.04* 0.05* 0.04*  --     Estimated Creatinine Clearance: 86.8 mL/min (by C-G formula based on Cr of 0.83).  Assessment:  49 y/o F on heparin gtt for afib. Initial HL therapeutic at 0.44, confirmatory level sub-therapeutic at 0.24.   Goal of Therapy:  Heparin level 0.3-0.7 units/ml Monitor platelets by anticoagulation protocol: Yes   Plan:  Increase heparin gtt to 1150 units/hr 6 hr HL Daily HL, CBC Monitor for S&S of bleed  Angela Burke, PharmD Pharmacy Resident Pager: [phone] 06/10/2015,10:38 AM

## 2015-06-10 NOTE — Consult Note (Signed)
ELECTROPHYSIOLOGY CONSULT NOTE    Patient ID: Kathleen Wright MRN: [id number], DOB/AGE: 10-01-1965 49 y.o.  Admit date: 06/09/2015 Date of Consult: 06/10/2015  Primary Physician: [name]., MD Primary Cardiologist: previously Dr Rollene Fare - seen by Dr Irish Lack this admission  Reason for Consultation: atrial flutter  HPI:  Kathleen Wright is a 49 y.o. female with a past medical history significant for COPD, HOCM s/p myomectomy, post op paroxysmal atrial fibrillation but developed elevated LFT's on amiodarone, and ongoing tobacco abuse. She presented to [facility] yesterday for evaluation of chest discomfort and palpitations. She was found to be in atrial flutter with RVR and transferred to [facility] for further evaluation.    She reports palpitations and chest pressure that began yesterday while driving to work. She had another episode of palpitations 6 weeks ago that terminated spontaneously. She has not had dizziness, pre-syncope, or syncope. She has not had recent fevers, chills, nausea or vomiting.   Echo is pending.  Labs are notable for slightly elevated troponin but with flat trend.   Past Medical History  Diagnosis Date  . COPD (chronic obstructive pulmonary disease) ([place])   . Hypertrophic cardiomyopathy ([place])     a. s/p myomectomy at [facility] 08/2004.  . Asthma   . Transient atrial fibrillation ([place]) 2006    a. after myomectomy at [facility] in 08/2004 - was on amiodarone but developed elevated LFTs felt possibly secondary to amio.   . Paroxysmal atrial flutter ([place])     a. post-op after myomectomy in 2006. s/p TEE/DCCV.  . Tobacco abuse   . Cervical cancer ([place])     a. treated with partial removal of cervix.     Surgical History:  Past Surgical History  Procedure Laterality Date  . Cardiac surgery  2006    septal myectomy  . Tubal ligation       Prescriptions prior to admission  Medication Sig Dispense Refill Last Dose  . albuterol (PROVENTIL) 2 MG tablet Take 2 mg by  mouth daily as needed for wheezing or shortness of breath.    Past Month at Unknown time  . ALPRAZolam (XANAX) 1 MG tablet Take 1 mg by mouth 3 (three) times daily as needed for anxiety.    Past Month at Unknown time  . HYDROcodone-acetaminophen (NORCO) 10-325 MG tablet Take 1 tablet by mouth every 6 (six) hours as needed for moderate pain or severe pain.    Past Month at Unknown time  . ibuprofen (ADVIL,MOTRIN) 800 MG tablet Take 1 tablet by mouth 3 (three) times daily as needed for mild pain or moderate pain.    Past Week at Unknown time  . PROAIR HFA 108 (90 Base) MCG/ACT inhaler Inhale 1-2 puffs into the lungs every 6 (six) hours as needed for wheezing or shortness of breath.    Past Week at Unknown time    Inpatient Medications:  . diltiazem  60 mg Oral 4 times per day  . sodium chloride  3 mL Intravenous Q12H    Allergies:  Allergies  Allergen Reactions  . Amiodarone Other (See Comments)    Liver function    Social History   Social History  . Marital Status: Married    Spouse Name: N/A  . Number of Children: N/A  . Years of Education: N/A   Occupational History  . Housekeeper    Social History Main Topics  . Smoking status: Current Every Day Smoker -- 0.50 packs/day    Types: Cigarettes  . Smokeless tobacco:  Not on file     Comment: since age 16  . Alcohol Use: No  . Drug Use: No  . Sexual Activity: Not on file   Other Topics Concern  . Not on file   Social History Narrative     Family History  Problem Relation Age of Onset  . Heart failure Father   . Diabetes    . Lung disease       Review of Systems: All other systems reviewed and are otherwise negative except as noted above.  Physical Exam: Filed Vitals:   06/10/15 0700 06/10/15 0800 06/10/15 0804 06/10/15 0810  BP: 88/58 90/69    Pulse: 126 125 88 88  Temp: 97.7 F (36.5 C)     TempSrc: Oral     Resp: 17 18 24 16   Height:      Weight:      SpO2: 98% 97% 95% 98%    GEN- The patient is well  appearing, alert and oriented x 3 today.   HEENT: normocephalic, atraumatic; sclera clear, conjunctiva pink; hearing intact; oropharynx clear; neck supple  Lungs- Clear to ausculation bilaterally, normal work of breathing.  Scattered rhonchi Heart- Irregular rate and rhythm, no murmurs, rubs or gallops  GI- soft, non-tender, non-distended, bowel sounds present  Extremities- no clubbing, cyanosis, or edema; DP/PT/radial pulses 2+ bilaterally MS- no significant deformity or atrophy Skin- warm and dry, no rash or lesion Psych- euthymic mood, full affect Neuro- strength and sensation are intact  Labs:   Lab Results  Component Value Date   WBC 10.9* 06/10/2015   HGB 12.8 06/10/2015   HCT 40.2 06/10/2015   MCV 89.3 06/10/2015   PLT 291 06/10/2015     Recent Labs Lab 06/10/15 0218  NA 141  K 4.5  CL 106  CO2 29  BUN 16  CREATININE 0.83  CALCIUM 8.6*  GLUCOSE 120*      Radiology/Studies: Dg Chest 2 View 06/09/2015  CLINICAL DATA:  Pt reports heart rate "is going up and down" today. Denies chest pain currently. Pt denies injury or sob. Pt states she had open heart surgery in 2006. EXAM: CHEST  2 VIEW COMPARISON:  09/03/2013 FINDINGS: Mild hyperinflation. Midline trachea. Mild cardiomegaly. Prior median sternotomy. No pleural effusion or pneumothorax. No congestive failure. Clear lungs. IMPRESSION: Mild cardiomegaly and hyperinflation. No acute superimposed process. Electronically Signed   By: Abigail Miyamoto M.D.   On: 06/09/2015 15:31    [id number] atrial flutter, ventricular rate 74  TELEMETRY: atrial flutter with controlled ventricular response   Assessment/Plan: 1.  Atypical atrial flutter The patient presented with atypical atrial flutter in the setting of chest pressure and prior myomectomy.  She was previously intolerant of amiodarone post myomectomy when she had atrial fibrillation.  She and her husband would prefer to avoid ablation and medications if possible. As this is  her first documented occurrence of atrial arrhythmia in >10 years, I think this is reasonable.  For now, would proceed with TEE/DCCV. Transition Heparin to NOAC once no further plans for invasive evaluation. If she has recurrence of atrial flutter, would recommend Tikosyn to maintain SR as long as QTc is acceptable in SR She is not a good candidate for ablation with likely left sided atrial flutter as well as previously documented AF  2.  Elevated troponin Trend flat No exertional symptoms with relatively strenuous daily activity   3.  HOCM S/p myomectomy Repeat echo pending this admission  4.  Tobacco abuse Cessation advised  Signed,  Chanetta Marshall, NP 06/10/2015 9:50 AM  I have seen, examined the patient, and reviewed the above assessment and plan.  On exam, irrr.  Very pleasant.  Changes to above are made where necessary.   Will switch from heparin to NOAC.  Today, I discussed Coumadin as well as novel anticoagulants including Pradaxa, Xarelto, and Eliquis today as indicated for risk reduction in stroke and systemic emboli with nonvalvular atrial fibrillation.  Risks, benefits, and alternatives to each of these drugs were discussed at length today.  She would prefer once daily xarelto. Stop heparin and start xarelto. Pt would prefer TEE guided cardioversion followed by watchful waiting rather than consideration of AAD therapy or ablation of her atypical atrial flutter.  I think that her thoughts are very reasonable.  Electrophysiology team to see as needed while here. Please call with questions.  Co Sign: [name], MD 06/10/2015 12:27 PM

## 2015-06-10 NOTE — Progress Notes (Signed)
Physicians notified regarding persistant elevation in HR 127-129 with no significant change in SBP, 90s-low 100s. Pt in chair, asymptomatic, denies any SOB, CP. Per Dr. Ree Kida, cancel transfer to floor. Per Dr. Irish Lack, give extra dose of Cardizem 15 mg PO now then increase Cardizem to 75 mg PO q6h. Orders entered and pharmacy notified. Pt also updated. Will continue to monitor.

## 2015-06-10 NOTE — Consult Note (Signed)
Cardiology Consultation Note  Patient ID: Kathleen Wright, MRN: [id number], DOB/AGE: 1965/11/21 49 y.o. Admit date: 06/09/2015   Date of Consult: 06/10/2015 Primary Physician: [name]., MD Primary Cardiologist: remotely seen by Dr. Rollene Fare & Dr. Mina Marble at [facility] Complaint: chest pressure, heart palpitations Reason for Consultation: elevated troponin, atrial flutter Requesting MD: Dr. Thurnell Garbe  HPI: Kathleen Wright is a 49 y/o F with HOCM s/p myomectomy in 2006, PAF/paroxymsal atrial flutter, baseline hypotension (reports BP is 90-100 chronically), ongoing tobacco abuse since age 82, COPD who presented to [facility] as a transfer from [facility] for atrial flutter. Per review of CareEverywhere she had transient post-op atrial fib after myomectomy in 2006 which was treated with amiodarone. This was later stopped due to presumed hepatotoxicity given elevated LFTs. She also had later developed atrial flutter after her surgery requiring TEE/DCCV. Post-op echo 09/2004 showed normal LV function with severe LVH, mild MR/TR, LVOT gradient at rest 3.34m/sec, 61mmHg, decreased c/w prior echo. She was on diltiazem and anticoagulation for a period of time after her surgery, but is no longer on these. She has not had any regular follow-up with cardiology in a long time and overall has done well. About 6 weeks ago she noticed a rapid heart rate that occurred twice in one day similar to prior arrhythmias. She practiced focused breathing and symptoms eventually resolved. 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 Several minutes later, the elevated HR started. She presented to [facility] where she was found to be in 2:1 rapid atrial flutter. She was given 10mg  IV diltiazem bolus x 2 and remains on a drip at 12.5mg /hr - she remains in variation of 2:1 block and variable block (HR 90s-120s). Labs show WBC 11.3->10.9, glucose 120, troponin  0.04->0.05, K 4.5, Cr 0.83, TSH normal. CXR: Mild cardiomegaly and hyperinflation. No acute superimposed process. She denies any SOB, nausea, vomiting, weight change, LEE, syncope. She still has mild awareness of elevated HR. 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   Past Medical History  Diagnosis Date  . COPD (chronic obstructive pulmonary disease) ([place])   . Hypertrophic cardiomyopathy ([place])     a. s/p myomectomy at [facility] 08/2004.  . Asthma   . Transient atrial fibrillation ([place]) 2006    a. after myomectomy at [facility] in 08/2004 - was on amiodarone but developed elevated LFTs felt possibly secondary to amio.   . Paroxysmal atrial flutter ([place])     a. post-op after myomectomy in 2006. s/p TEE/DCCV.  . Tobacco abuse   . Cervical cancer ([place])     a. treated with partial removal of cervix.     Surgical History:  Past Surgical History  Procedure Laterality Date  . Cardiac surgery  2006    septal myectomy  . Tubal ligation       Home Meds: Prior to Admission medications   Medication Sig Start Date End Date Taking? Authorizing Provider  albuterol (PROVENTIL) 2 MG tablet Take 2 mg by mouth daily as needed for wheezing or shortness of breath.  04/21/15  Yes Historical Provider, MD  ALPRAZolam Duanne Moron) 1 MG tablet Take 1 mg by mouth 3 (three) times daily as needed for anxiety.  05/27/15  Yes Historical Provider, MD  HYDROcodone-acetaminophen (NORCO) 10-325 MG tablet Take 1 tablet by mouth every 6 (six) hours as needed for moderate  pain or severe pain.  05/27/15  Yes Historical Provider, MD  ibuprofen (ADVIL,MOTRIN) 800 MG tablet Take 1 tablet by mouth 3 (three) times daily as needed for mild pain or moderate pain.  05/06/15  Yes Historical Provider, MD  PROAIR HFA 108 (90 Base) MCG/ACT inhaler Inhale 1-2 puffs into the lungs every 6 (six) hours as needed for wheezing or shortness of breath.  05/27/15  Yes Historical  Provider, MD    Inpatient Medications:  . sodium chloride   Intravenous STAT  . sodium chloride  3 mL Intravenous Q12H   . sodium chloride 10 mL/hr at 06/10/15 0743  . diltiazem (CARDIZEM) infusion 12.5 mg/hr (06/10/15 0609)  . diltiazem (CARDIZEM) infusion 5 mg/hr (06/09/15 1813)  . heparin 1,000 Units/hr (06/09/15 2126)    Allergies:  Allergies  Allergen Reactions  . Amiodarone Other (See Comments)    Liver function    Social History   Social History  . Marital Status: Married    Spouse Name: N/A  . Number of Children: N/A  . Years of Education: N/A   Occupational History  . Housekeeper    Social History Main Topics  . Smoking status: Current Every Day Smoker -- 0.50 packs/day    Types: Cigarettes  . Smokeless tobacco: Not on file     Comment: since age 4  . Alcohol Use: No  . Drug Use: No  . Sexual Activity: Not on file   Other Topics Concern  . Not on file   Social History Narrative     Family History  Problem Relation Age of Onset  . Heart failure Father   . Diabetes    . Lung disease       Review of Systems: All other systems reviewed and are otherwise negative except as noted above.  Labs:  Recent Labs  06/09/15 1613 06/10/15 0218  TROPONINI 0.04* 0.05*   Lab Results  Component Value Date   WBC 10.9* 06/10/2015   HGB 12.8 06/10/2015   HCT 40.2 06/10/2015   MCV 89.3 06/10/2015   PLT 291 06/10/2015    Recent Labs Lab 06/10/15 0218  NA 141  K 4.5  CL 106  CO2 29  BUN 16  CREATININE 0.83  CALCIUM 8.6*  GLUCOSE 120*   Radiology/Studies:  Dg Chest 2 View  06/09/2015  CLINICAL DATA:  Pt reports heart rate "is going up and down" today. Denies chest pain currently. Pt denies injury or sob. Pt states she had open heart surgery in 2006. EXAM: CHEST  2 VIEW COMPARISON:  09/03/2013 FINDINGS: Mild hyperinflation. Midline trachea. Mild cardiomegaly. Prior median sternotomy. No pleural effusion or pneumothorax. No congestive failure.  Clear lungs. IMPRESSION: Mild cardiomegaly and hyperinflation. No acute superimposed process. Electronically Signed   By: Abigail Miyamoto M.D.   On: 06/09/2015 15:31    Wt Readings from Last 3 Encounters:  06/09/15 215 lb 6.2 oz (97.7 kg)    EKG: atrial flutter 114bpm, IVCD consider atypical LBBB, downsloped TWI/ST depression inferiorly as well as V3-V6  Physical Exam: Blood pressure 90/69, pulse 125, temperature 97.7 F (36.5 C), temperature source Oral, resp. rate 18, height 5\' 1"  (1.549 m), weight 215 lb 6.2 oz (97.7 kg), SpO2 97 %. General: Well developed, well nourished WF in no acute distress. Comfortable appearing Head: Normocephalic, atraumatic, sclera non-icteric, no xanthomas, nares are without discharge.  Neck: Negative for carotid bruits. JVD not elevated. Lungs: Clear bilaterally to auscultation without wheezes, rales, or rhonchi. Breathing is unlabored. Heart: Mostly regular  tachycardic rhythm with occasional irregularity with S1 S2. No murmurs, rubs, or gallops appreciated. Abdomen: Soft, non-tender, non-distended with normoactive bowel sounds. No hepatomegaly. No rebound/guarding. No obvious abdominal masses. Msk:  Strength and tone appear normal for age. Extremities: No clubbing or cyanosis. No edema.  Distal pedal pulses are 2+ and equal bilaterally. Neuro: Alert and oriented X 3. No facial asymmetry. No focal deficit. Moves all extremities spontaneously. Psych:  Responds to questions appropriately with a normal affect.    Assessment and Plan:  1. Recurrence of paroxysmal atrial flutter - CHADSVASC is currently 1 for female but A1c pending for elevated blood sugar and echocardiogram pending to assess LV function. BP prohibits further med titration (patient reports this is her baseline BP). Continue heparin per pharmacy until this information is available. Will d/w MD. Could consider TEE/DCCV, keeping in mind she also reports an episode of elevated HR 6 weeks ago as well. If  we did TEE/DCCV, would consider placing on scheduled oral diltiazem thereafter and following for recurrence. May need EP to see to consider ablation. Note prior h/o hepatotoxicity with amiodarone -- and h/o structural heart disease limits AAD.   2. Chest pressure and elevated troponin - troponin trend flat so far. Pressure may have been due to #1 but it sounds like this symptom preceded her palpitations by a few minutes. She has not had any recent anginal-type symptoms. Will d/w MD.  3. Remote history of transient post-op atrial fibrillation after myomectomy (h/o hepatotoxicity with amiodarone) - unclear if any clinical recurrence. She had episode of rapid HR 6 weeks ago but did not seek care at that time.  4. Hypertrophic obstructive cardiomyopathy s/p myomectomy in 2006 - lost to f/u since that time. Echo 09/2004 still showed severe LHV. Will order 2D Echo.  5. COPD with ongoing tobacco abuse - counseled regarding cessation.  Signed, Charlie Pitter PA-C 06/10/2015, 8:04 AM Pager: [phone]  I have examined the patient and reviewed assessment and plan and discussed with patient.  Agree with above as stated.  Recurrent atrial flutter in a patient with prior cardiac surgery for HOCM.  She had done well for many years post op.  Elevated troponin.    Likely demand ischemia.  Would not pursue ischemia w/u at this time.   EP consult to see if she is a candidate for flutter ablation given her young age. Change dilt to oral. COnsider TEE/CV. Await TTE result as well.  No signs of CHF on exam.   Prior difficulty with Amio.  Known LVH as well.   She needs to stop smoking.  Lung disease may be contributing to her atrial arrhythmia.  VARANASI,JAYADEEP S.

## 2015-06-10 NOTE — Progress Notes (Signed)
Echocardiogram 2D Echocardiogram has been performed.  Joelene Millin 06/10/2015, 9:13 AM

## 2015-06-11 LAB — HEMOGLOBIN A1C
Hgb A1c MFr Bld: 5.8 % — ABNORMAL HIGH (ref 4.8–5.6)
MEAN PLASMA GLUCOSE: 120 mg/dL

## 2015-06-11 LAB — BASIC METABOLIC PANEL
ANION GAP: 7 (ref 5–15)
BUN: 21 mg/dL — ABNORMAL HIGH (ref 6–20)
CHLORIDE: 106 mmol/L (ref 101–111)
CO2: 25 mmol/L (ref 22–32)
Calcium: 9 mg/dL (ref 8.9–10.3)
Creatinine, Ser: 0.89 mg/dL (ref 0.44–1.00)
GFR calc non Af Amer: >60 mL/min (ref >60)
Glucose, Bld: 109 mg/dL — ABNORMAL HIGH (ref 65–99)
POTASSIUM: 4.3 mmol/L (ref 3.5–5.1)
SODIUM: 138 mmol/L (ref 135–145)

## 2015-06-11 LAB — CBC
HEMATOCRIT: 41.7 % (ref 36.0–46.0)
HEMOGLOBIN: 13.3 g/dL (ref 12.0–15.0)
MCH: 28.5 pg (ref 26.0–34.0)
MCHC: 31.9 g/dL (ref 30.0–36.0)
MCV: 89.5 fL (ref 78.0–100.0)
Platelets: 316 10*3/uL (ref 150–400)
RBC: 4.66 MIL/uL (ref 3.87–5.11)
RDW: 15.4 % (ref 11.5–15.5)
WBC: 12.6 10*3/uL — AB (ref 4.0–10.5)

## 2015-06-11 MED ORDER — SODIUM CHLORIDE 0.9 % IV SOLN: INTRAVENOUS | Status: DC

## 2015-06-11 NOTE — Progress Notes (Signed)
Patient had 3.77 pause at 2051, see strip saved. Patient converted to NSR with HR 70's. K.Shorr notified. Patient on PO dosing of Cardizem. Will leave patient prepared for DCCV in AM if needed.  Monitoring closely.

## 2015-06-11 NOTE — Care Management Note (Signed)
Case Management Note  Patient Details  Name: CHARDONNAE LINCE MRN: [id number] Date of Birth: 1965-08-22  Subjective/Objective:     Adm w ch pain               Action/Plan:lives w husband, pcp dr fusco  Expected Discharge Date:                  Expected Discharge Plan:  Home/Self Care  In-House Referral:     Discharge planning Services  CM Consult, Medication Assistance  Post Acute Care Choice:    Choice offered to:     DME Arranged:    DME Agency:     HH Arranged:    HH Agency:     Status of Service:     Medicare Important Message Given:    Date Medicare IM Given:    Medicare IM give by:    Date Additional Medicare IM Given:    Additional Medicare Important Message give by:     If discussed at [place] of Stay Meetings, dates discussed:    Additional Comments:gave pt 30day free xarelto card. She has Clinical cytogeneticist for ins for meds.  Lacretia Leigh, RN 06/11/2015, 10:14 AM

## 2015-06-11 NOTE — Progress Notes (Signed)
[facility]                                                                              Patient Demographics  Kathleen Wright, is a 49 y.o. female, DOB - 1966-04-16, [id number]  Admit date - 06/09/2015   Admitting Physician Verlee Monte, MD  Outpatient Primary MD for the patient is [name]., MD  LOS - 1   Chief Complaint  Patient presents with  . Irregular Heart Beat  . Chest Pain      HPI on 06/09/2015 by Dr. Verlee Monte Kathleen Wright is a 49 y.o. female with PMI, COPD and transient postoperative atrial fibrillation back in 2006, came in to the hospital complaining about palpitations and chest heaviness. She had 2 episodes in the past week. This morning it was intense so she decided to come to the hospital. Mainly she had palpitations with heart rate going up and down, substernal chest heaviness without radiation, mild pain. She denies any fever, chills, cough or other symptoms. In the ED CXR showed no acute events, blood work w blood work CBC/BMP ithin normal limits, slightly elevated troponin of 0.04, EKG showed 2-1 atrial flutter, patient has significant EKG changes including ST elevation in V1/V2 and ST depression and V4/V5 and V6, this is discussed with Dr. Gypsy Balsam of cardiology, this appears to be secondary to the previous surgery and likely not to be present STEMI.  Assessment & Plan   Paroxysmal atrial fibrillation -CHADSVASC 1 (based on age) -Recurrent, supposedly occurred years ago after myomectomy. Patient was also taking amiodarone but developed hepatotoxicity. -Patient admits to having a rapid heart rate 6 weeks ago however did not seek care -Patient has not seen a cardiologist within [facility] -Echocardiogram: EF 60-65% left Atrium moderately dilated, LVH, no defect or PFO noted -Cardiology consulted and appreciated -Placed on cardizem 75mg  q6 h and transitioned to [place] for DCCV for 06/12/2015 -Patient currently not rate  controlled  Chest pressure with mild elevated troponin -Likely secondary to the above -Troponin 0.04/0.05 -Cardiology not recommending ischemic workup, follow up as an outpatient   COPD -Currently compensated -Continue nebs PRN   Tobacco abuse -Smoking cessation discussed -Declined nicotine patch  History of ventricular septal myomectomy -History of HOCM, treated at [facility] in 2006.  Patient to have postoperative atrial fibrillation which was treated with cardioversion at that time  Code Status: Full  Family Communication: None at bedside  Disposition Plan: Admitted. Pending TEE/DCCV 12/30  Time Spent in minutes   30 minutes  Procedures  Echocardiogram  Consults   Cardiology  DVT Prophylaxis  Xarelto  Lab Results  Component Value Date   PLT 316 06/11/2015    Medications  Scheduled Meds: . diltiazem  75 mg Oral 4 times per day  . rivaroxaban  20 mg Oral Q supper  . sodium chloride  3 mL Intravenous Q12H   Continuous Infusions: . sodium chloride 10 mL/hr at 06/10/15 0743   PRN Meds:.acetaminophen **OR** acetaminophen, HYDROcodone-acetaminophen, levalbuterol, morphine injection, nitroGLYCERIN, ondansetron **OR** ondansetron (ZOFRAN) IV  Antibiotics    Anti-infectives    None      Subjective:   Kathleen Wright seen and examined today.  Patient  denies chest pain or shortness of breath, abdominal pain, dizziness, headache.    Objective:   Filed Vitals:   06/11/15 0007 06/11/15 0400 06/11/15 0407 06/11/15 0805  BP: 91/79  91/60 104/73  Pulse:  125 125 126  Temp:   97.8 F (36.6 C) 97.6 F (36.4 C)  TempSrc:   Oral Oral  Resp:  23 29 19   Height:      Weight:      SpO2:  97% 97% 98%    Wt Readings from Last 3 Encounters:  06/09/15 97.7 kg (215 lb 6.2 oz)     Intake/Output Summary (Last 24 hours) at 06/11/15 1110 Last data filed at 06/11/15 0900  Gross per 24 hour  Intake 648.68 ml  Output      0 ml  Net 648.68 ml     Exam  General: Well developed, well nourished, NAD  HEENT: NCAT, mucous membranes moist.   Neck: Supple, no JVD, no masses  Cardiovascular: S1 S2 auscultated, regular, tachycardic  Respiratory: Clear to auscultation bilaterally  Abdomen: Soft, nontender, nondistended, + bowel sounds  Extremities: warm dry without cyanosis clubbing or edema  Neuro: AAOx3, nonfocal  Psych: Appropriate mood and affect  Data Review   Micro Results Recent Results (from the past 240 hour(s))  MRSA PCR Screening     Status: None   Collection Time: 06/09/15  8:06 PM  Result Value Ref Range Status   MRSA by PCR NEGATIVE NEGATIVE Final    Comment:        The GeneXpert MRSA Assay (FDA approved for NASAL specimens only), is one component of a comprehensive MRSA colonization surveillance program. It is not intended to diagnose MRSA infection nor to guide or monitor treatment for MRSA infections.     Radiology Reports Dg Chest 2 View  06/09/2015  CLINICAL DATA:  Pt reports heart rate "is going up and down" today. Denies chest pain currently. Pt denies injury or sob. Pt states she had open heart surgery in 2006. EXAM: CHEST  2 VIEW COMPARISON:  09/03/2013 FINDINGS: Mild hyperinflation. Midline trachea. Mild cardiomegaly. Prior median sternotomy. No pleural effusion or pneumothorax. No congestive failure. Clear lungs. IMPRESSION: Mild cardiomegaly and hyperinflation. No acute superimposed process. Electronically Signed   By: Abigail Miyamoto M.D.   On: 06/09/2015 15:31    CBC  Recent Labs Lab 06/09/15 1613 06/10/15 0218 06/11/15 0310  WBC 11.3* 10.9* 12.6*  HGB 13.7 12.8 13.3  HCT 42.0 40.2 41.7  PLT 329 291 316  MCV 89.0 89.3 89.5  MCH 29.0 28.4 28.5  MCHC 32.6 31.8 31.9  RDW 15.1 15.3 15.4    Chemistries   Recent Labs Lab 06/09/15 1613 06/10/15 0218 06/11/15 0310  NA 137 141 138  K 3.9 4.5 4.3  CL 103 106 106  CO2 26 29 25   GLUCOSE 112* 120* 109*  BUN 17 16 21*   CREATININE 0.73 0.83 0.89  CALCIUM 9.1 8.6* 9.0   ------------------------------------------------------------------------------------------------------------------ estimated creatinine clearance is 80.9 mL/min (by C-G formula based on Cr of 0.89). ------------------------------------------------------------------------------------------------------------------  Recent Labs  06/10/15 0218  HGBA1C 5.8*   ------------------------------------------------------------------------------------------------------------------ No results for input(s): CHOL, HDL, LDLCALC, TRIG, CHOLHDL, LDLDIRECT in the last 72 hours. ------------------------------------------------------------------------------------------------------------------  Recent Labs  06/10/15 0218  TSH 1.064   ------------------------------------------------------------------------------------------------------------------ No results for input(s): VITAMINB12, FOLATE, FERRITIN, TIBC, IRON, RETICCTPCT in the last 72 hours.  Coagulation profile No results for input(s): INR, PROTIME in the last 168 hours.  No results for input(s): DDIMER in the  last 72 hours.  Cardiac Enzymes  Recent Labs Lab 06/09/15 1613 06/10/15 0218 06/10/15 0755  TROPONINI 0.04* 0.05* 0.04*   ------------------------------------------------------------------------------------------------------------------ Invalid input(s): POCBNP    Willy Vorce D.O. on 06/11/2015 at 11:10 AM  Between 7am to 7pm - Pager - [phone]  After 7pm go to [email or web address] - password [email or web address]  And look for the night coverage person covering for me after hours  [facility] Office  [phone]

## 2015-06-11 NOTE — Progress Notes (Signed)
Patient: Kathleen Wright / Admit Date: 06/09/2015 / Date of Encounter: 06/11/2015, 6:39 AM   Subjective: HR hanging back around 2:1 but she is tolerating this well. No chest pain, SOB. Reports cravings for nicotine.   Objective: Telemetry: atrial flutter 2:1, HR mostly 120s. Occasional dips to the 90s. Physical Exam: Blood pressure 91/60, pulse 125, temperature 97.8 F (36.6 C), temperature source Oral, resp. rate 29, height 5\' 1"  (1.549 m), weight 215 lb 6.2 oz (97.7 kg), SpO2 97 %. General: Well developed, well nourished WF, in no acute distress. Head: Normocephalic, atraumatic, sclera non-icteric, no xanthomas, nares are without discharge. Neck: Negative for carotid bruits. JVP not elevated. Lungs: Clear bilaterally to auscultation without wheezes, rales, or rhonchi. Breathing is unlabored. Heart: Mostly regular tachycardic rhythm S1 S2 without murmurs, rubs, or gallops.  Abdomen: Soft, non-tender, non-distended with normoactive bowel sounds. No rebound/guarding. Extremities: No clubbing or cyanosis. No edema. Distal pedal pulses are 2+ and equal bilaterally. Neuro: Alert and oriented X 3. Moves all extremities spontaneously. Psych:  Responds to questions appropriately with a normal affect.   Intake/Output Summary (Last 24 hours) at 06/11/15 [id number] Last data filed at 06/11/15 0000  Gross per 24 hour  Intake 878.48 ml  Output    300 ml  Net 578.48 ml    Inpatient Medications:  . diltiazem  75 mg Oral 4 times per day  . rivaroxaban  20 mg Oral Q supper  . sodium chloride  3 mL Intravenous Q12H   Infusions:  . sodium chloride 10 mL/hr at 06/10/15 0743    Labs:  Recent Labs  06/10/15 0218 06/11/15 0310  NA 141 138  K 4.5 4.3  CL 106 106  CO2 29 25  GLUCOSE 120* 109*  BUN 16 21*  CREATININE 0.83 0.89  CALCIUM 8.6* 9.0   No results for input(s): AST, ALT, ALKPHOS, BILITOT, PROT, ALBUMIN in the last 72 hours.  Recent Labs  06/10/15 0218 06/11/15 0310  WBC 10.9*  12.6*  HGB 12.8 13.3  HCT 40.2 41.7  MCV 89.3 89.5  PLT 291 316    Recent Labs  06/09/15 1613 06/10/15 0218 06/10/15 0755  TROPONINI 0.04* 0.05* 0.04*   Invalid input(s): POCBNP  Recent Labs  06/10/15 0218  HGBA1C 5.8*     Radiology/Studies:  Dg Chest 2 View  06/09/2015  CLINICAL DATA:  Pt reports heart rate "is going up and down" today. Denies chest pain currently. Pt denies injury or sob. Pt states she had open heart surgery in 2006. EXAM: CHEST  2 VIEW COMPARISON:  09/03/2013 FINDINGS: Mild hyperinflation. Midline trachea. Mild cardiomegaly. Prior median sternotomy. No pleural effusion or pneumothorax. No congestive failure. Clear lungs. IMPRESSION: Mild cardiomegaly and hyperinflation. No acute superimposed process. Electronically Signed   By: Abigail Miyamoto M.D.   On: 06/09/2015 15:31     Assessment and Plan  1. Recurrence of paroxysmal atrial flutter - CHADSVASC is currently 1 for female. BP prohibits further med titration (patient reports this is her baseline BP). EP has reviewed options of TEE/DCCV, AAD, ablation and at this time the patient prefers TEE/DCCV. Schedule is full today but she is on for tomorrow. Risks/benefits discussed with patient who is agreeable to proceeding. Will continue oral diltiazem at present dose. Could consider bumping up to 90mg  Q6 but I suspect her atrial flutter will be difficult to rate control until we can get her out of it.  EP has recommended Xarelto. Long-term duration of this medicine TBD. Note prior h/o hepatotoxicity  with amiodarone -- and h/o structural heart disease limits AAD.   2. Chest pressure and elevated troponin - troponin trend flat so far. Outside of this rhythm, she has not had any recent anginal-type symptoms. Dr. Irish Lack is not recommending any ischemic w/u at this time. Can follow as outpatient.  3. Remote history of transient post-op atrial fibrillation after myomectomy (h/o hepatotoxicity with amiodarone) - unclear if any  clinical recurrence. She had episode of rapid HR 6 weeks ago but did not seek care at that time.  4. Hypertrophic obstructive cardiomyopathy s/p myomectomy in 2006 - lost to f/u since that time. 2D Echo 06/10/15: post-septal myomectomy with scarring mid and apical septum moderately thickened, no SAM or LVOT gradient, mod LVH, EF 60-65%, mild MR, mod dilated LA.  5. COPD with ongoing tobacco abuse - counseled regarding cessation. She declined nicotine patch at this time.  Signed, Melina Copa PA-C Pager: [phone]   I have examined the patient and reviewed assessment and plan and discussed with patient.  Agree with above as stated.  Heart rate variable, Ranging anywhere from the 90s to the 120s.  She does feel palpitations when her heart rate is high. She feels much better when her heart rate is under 100. Her blood pressure chronically has been borderline. Hopefully, it will increase with normal sinus rhythm being established. She is on the schedule for TEE/ cardioversion tomorrow.  All questions about the procedure were answered.  Promyse Ardito S.

## 2015-06-11 NOTE — Progress Notes (Signed)
Cardiology fellow notified of patient converting to NSR. No call back received. Will continue to monitor closely.

## 2015-06-12 ENCOUNTER — Encounter (HOSPITAL_COMMUNITY)
Admission: EM | Disposition: A | Discharge status: Home / Self Care | Payer: Self-pay | Source: Home / Self Care | Attending: Internal Medicine

## 2015-06-12 LAB — CBC
HCT: 39.6 % (ref 36.0–46.0)
Hemoglobin: 12.8 g/dL (ref 12.0–15.0)
MCH: 28.7 pg (ref 26.0–34.0)
MCHC: 32.3 g/dL (ref 30.0–36.0)
MCV: 88.8 fL (ref 78.0–100.0)
PLATELETS: 302 10*3/uL (ref 150–400)
RBC: 4.46 MIL/uL (ref 3.87–5.11)
RDW: 15.3 % (ref 11.5–15.5)
WBC: 10.2 10*3/uL (ref 4.0–10.5)

## 2015-06-12 LAB — BASIC METABOLIC PANEL
Anion gap: 8 (ref 5–15)
BUN: 18 mg/dL (ref 6–20)
CALCIUM: 9.7 mg/dL (ref 8.9–10.3)
CO2: 28 mmol/L (ref 22–32)
CREATININE: 0.77 mg/dL (ref 0.44–1.00)
Chloride: 106 mmol/L (ref 101–111)
GFR calc non Af Amer: >60 mL/min (ref >60)
Glucose, Bld: 106 mg/dL — ABNORMAL HIGH (ref 65–99)
Potassium: 4.6 mmol/L (ref 3.5–5.1)
SODIUM: 142 mmol/L (ref 135–145)

## 2015-06-12 SURGERY — ECHOCARDIOGRAM, TRANSESOPHAGEAL
Anesthesia: Monitor Anesthesia Care

## 2015-06-12 MED ORDER — RIVAROXABAN 20 MG PO TABS: 20.0000 mg | ORAL_TABLET | Freq: Every day | ORAL | Status: DC

## 2015-06-12 MED ORDER — DILTIAZEM HCL ER COATED BEADS 180 MG PO CP24
300.0000 mg | ORAL_CAPSULE | Freq: Every day | ORAL | Status: DC
  Administered 2015-06-12: 300 mg via ORAL
  Filled 2015-06-12: qty 1

## 2015-06-12 MED ORDER — DILTIAZEM HCL ER COATED BEADS 300 MG PO CP24: 300.0000 mg | ORAL_CAPSULE | Freq: Every day | ORAL | Status: DC

## 2015-06-12 NOTE — Progress Notes (Signed)
SUBJECTIVE:  She feels better.  No complaints.  Converted to NSR last evening.  POst termination pause of <4 secs, no sx associated.  OBJECTIVE:   Vitals:   Filed Vitals:   06/12/15 0000 06/12/15 0400 06/12/15 0416 06/12/15 0739  BP:    104/73  Pulse: 71 65  64  Temp:   98 F (36.7 C) 97.8 F (36.6 C)  TempSrc:   Oral Oral  Resp: 19 22  15   Height:      Weight:      SpO2: 100% 92%  96%   I&O's:   Intake/Output Summary (Last 24 hours) at 06/12/15 1031 Last data filed at 06/11/15 2000  Gross per 24 hour  Intake    600 ml  Output      0 ml  Net    600 ml   TELEMETRY: Reviewed telemetry pt in NSR     PHYSICAL EXAM General: Well developed, well nourished, in no acute distress Head:   Normal cephalic and atramatic  Lungs:   Clear bilaterally to auscultation. Heart:  HRRR S1 S2  No JVD.  Premature beat Abdomen: abdomen soft and non-tender Msk:  Back normal,  Normal strength and tone for age. Extremities:   No edema.   Neuro: Alert and oriented. Psych:  Normal affect, responds appropriately Skin: No rash   LABS: Basic Metabolic Panel:  Recent Labs  06/11/15 0310 06/12/15 0442  NA 138 142  K 4.3 4.6  CL 106 106  CO2 25 28  GLUCOSE 109* 106*  BUN 21* 18  CREATININE 0.89 0.77  CALCIUM 9.0 9.7   Liver Function Tests: No results for input(s): AST, ALT, ALKPHOS, BILITOT, PROT, ALBUMIN in the last 72 hours. No results for input(s): LIPASE, AMYLASE in the last 72 hours. CBC:  Recent Labs  06/11/15 0310 06/12/15 0442  WBC 12.6* 10.2  HGB 13.3 12.8  HCT 41.7 39.6  MCV 89.5 88.8  PLT 316 302   Cardiac Enzymes:  Recent Labs  06/09/15 1613 06/10/15 0218 06/10/15 0755  TROPONINI 0.04* 0.05* 0.04*   BNP: Invalid input(s): POCBNP D-Dimer: No results for input(s): DDIMER in the last 72 hours. Hemoglobin A1C:  Recent Labs  06/10/15 0218  HGBA1C 5.8*   Fasting Lipid Panel: No results for input(s): CHOL, HDL, LDLCALC, TRIG, CHOLHDL, LDLDIRECT in  the last 72 hours. Thyroid Function Tests:  Recent Labs  06/10/15 0218  TSH 1.064   Anemia Panel: No results for input(s): VITAMINB12, FOLATE, FERRITIN, TIBC, IRON, RETICCTPCT in the last 72 hours. Coag Panel:   No results found for: INR, PROTIME  RADIOLOGY: Dg Chest 2 View  06/09/2015  CLINICAL DATA:  Pt reports heart rate "is going up and down" today. Denies chest pain currently. Pt denies injury or sob. Pt states she had open heart surgery in 2006. EXAM: CHEST  2 VIEW COMPARISON:  09/03/2013 FINDINGS: Mild hyperinflation. Midline trachea. Mild cardiomegaly. Prior median sternotomy. No pleural effusion or pneumothorax. No congestive failure. Clear lungs. IMPRESSION: Mild cardiomegaly and hyperinflation. No acute superimposed process. Electronically Signed   By: Abigail Miyamoto M.D.   On: 06/09/2015 15:31      ASSESSMENT: / PLAN:    1) Atrial flutter: Now in NSR.  Changed cardizem to 300 mg daily.  Xarelto for stroke prevention.  Symptom free. OK to discharge from a cardiac standpoint.  Normal LV function without significant LVOT gradient.  She needs to stop smoking.  Cardiology f/u in [place].  Spoke to Dr. Ree Kida.  Jettie Booze, MD  06/12/2015  10:31 AM

## 2015-06-12 NOTE — Progress Notes (Signed)
Pt discharged by wheelchair via her son and daughter. Pt A&Ox4; vital signs stable; IV taken out. Went over discharge paperwork with pt and she voiced understanding. Pt voiced wanting to schedule her own follow up appointment and understands it needs to be within the next week. Will continue to monitor.

## 2015-06-12 NOTE — Care Management Important Message (Signed)
Important Message  Patient Details  Name: Kathleen Wright MRN: [id number] Date of Birth: 1965-11-08   Medicare Important Message Given:  Yes    Kathleen Wright Kathleen Wright 06/12/2015, 2:00 PM

## 2015-06-12 NOTE — Discharge Summary (Signed)
Physician Discharge Summary  Kathleen Wright [id number] DOB: 11-22-1965 DOA: 06/09/2015  PCP: [name]., MD  Admit date: 06/09/2015 Discharge date: 06/12/2015  Time spent: 45 minutes  Recommendations for Outpatient Follow-up:  Patient will be discharged to home.  Patient will need to follow up with primary care provider within one week of discharge.  Follow up with Cardiology-office will contact you for appointment.  Patient should continue medications as prescribed.  Patient should follow a heart healthy diet.   Discharge Diagnoses:  Paroxysmal atrial fibrillation/flutter Chest pressure was not elevated troponin COPD Tobacco abuse History of ventricular septal myomectomy/HOCM  Discharge Condition: stable  Diet recommendation: heart healthy  Filed Weights   06/09/15 1830 06/09/15 2000 06/09/15 2100  Weight: 88.451 kg (195 lb) 97.7 kg (215 lb 6.2 oz) 97.7 kg (215 lb 6.2 oz)    History of present illness:  on 06/09/2015 by Dr. Verlee Monte Kathleen Wright is a 49 y.o. female with PMI, COPD and transient postoperative atrial fibrillation back in 2006, came in to the hospital complaining about palpitations and chest heaviness. She had 2 episodes in the past week. This morning it was intense so she decided to come to the hospital. Mainly she had palpitations with heart rate going up and down, substernal chest heaviness without radiation, mild pain. She denies any fever, chills, cough or other symptoms. In the ED CXR showed no acute events, blood work w blood work CBC/BMP ithin normal limits, slightly elevated troponin of 0.04, EKG showed 2-1 atrial flutter, patient has significant EKG changes including ST elevation in V1/V2 and ST depression and V4/V5 and V6, this is discussed with Dr. Gypsy Balsam of cardiology, this appears to be secondary to the previous surgery and likely not to be present STEMI.  Hospital Course:  Paroxysmal atrial fibrillation -CHADSVASC 1 (based on  age) -Recurrent, supposedly occurred years ago after myomectomy. Patient was also taking amiodarone but developed hepatotoxicity. -Patient admits to having a rapid heart rate 6 weeks ago however did not seek care -Patient has not seen a cardiologist within [facility] -Echocardiogram: EF 60-65% left Atrium moderately dilated, LVH, no defect or PFO noted -Cardiology consulted and appreciated -Continue xarelto, Cardizem 300mg  daily  -Patient converted overnight, No DCCV today. -Spoke with Dr. Beau Fanny, ok to discharge with outpatient follow up in the [facility] office  Chest pressure with mild elevated troponin -Likely secondary to the above -Troponin 0.04/0.05 -Cardiology not recommending ischemic workup, follow up as an outpatient   COPD -Currently compensated -Continue nebs PRN   Tobacco abuse -Smoking cessation discussed -Declined nicotine patch  History of ventricular septal myomectomy -History of HOCM, treated at [facility] in 2006. Patient to have postoperative atrial fibrillation which was treated with cardioversion at that time  Procedures  Echocardiogram  Consults  Cardiology  Discharge Exam: Filed Vitals:   06/12/15 0416 06/12/15 0739  BP:  104/73  Pulse:  64  Temp: 98 F (36.7 C) 97.8 F (36.6 C)  Resp:  15    Exam  General: Well developed, well nourished, NAD  HEENT: NCAT, mucous membranes moist.   Cardiovascular: S1 S2 auscultated, regular  Respiratory: Clear to auscultation bilaterally  Abdomen: Soft, nontender, nondistended, + bowel sounds  Extremities: warm dry without cyanosis clubbing or edema  Neuro: AAOx3, nonfocal  Psych: Appropriate mood and affect, pleasant  Discharge Instructions      Discharge Instructions    Discharge instructions    Complete by:  As directed   Patient will be discharged to home.  Patient will need to follow up with primary care provider within one week of discharge.  Follow up with Cardiology-office  will contact you for appointment.  Patient should continue medications as prescribed.  Patient should follow a heart healthy diet.            Medication List    TAKE these medications        ALPRAZolam 1 MG tablet  Commonly known as:  XANAX  Take 1 mg by mouth 3 (three) times daily as needed for anxiety.     diltiazem 300 MG 24 hr capsule  Commonly known as:  CARDIZEM CD  Take 1 capsule (300 mg total) by mouth daily.     HYDROcodone-acetaminophen 10-325 MG tablet  Commonly known as:  NORCO  Take 1 tablet by mouth every 6 (six) hours as needed for moderate pain or severe pain.     ibuprofen 800 MG tablet  Commonly known as:  ADVIL,MOTRIN  Take 1 tablet by mouth 3 (three) times daily as needed for mild pain or moderate pain.     albuterol 2 MG tablet  Commonly known as:  PROVENTIL  Take 2 mg by mouth daily as needed for wheezing or shortness of breath.     PROAIR HFA 108 (90 Base) MCG/ACT inhaler  Generic drug:  albuterol  Inhale 1-2 puffs into the lungs every 6 (six) hours as needed for wheezing or shortness of breath.     rivaroxaban 20 MG Tabs tablet  Commonly known as:  XARELTO  Take 1 tablet (20 mg total) by mouth daily with supper.       Allergies  Allergen Reactions  . Amiodarone Other (See Comments)    Liver function   Follow-up Information    Follow up with [name]., MD. Schedule an appointment as soon as possible for a visit in 1 week.   Specialty:  Internal Medicine   Why:  Hospital follow-up   Contact information:   [address] [place] [id number] [phone]        The results of significant diagnostics from this hospitalization (including imaging, microbiology, ancillary and laboratory) are listed below for reference.    Significant Diagnostic Studies: Dg Chest 2 View  06/09/2015  CLINICAL DATA:  Pt reports heart rate "is going up and down" today. Denies chest pain currently. Pt denies injury or sob. Pt states she had open  heart surgery in 2006. EXAM: CHEST  2 VIEW COMPARISON:  09/03/2013 FINDINGS: Mild hyperinflation. Midline trachea. Mild cardiomegaly. Prior median sternotomy. No pleural effusion or pneumothorax. No congestive failure. Clear lungs. IMPRESSION: Mild cardiomegaly and hyperinflation. No acute superimposed process. Electronically Signed   By: Abigail Miyamoto M.D.   On: 06/09/2015 15:31    Microbiology: Recent Results (from the past 240 hour(s))  MRSA PCR Screening     Status: None   Collection Time: 06/09/15  8:06 PM  Result Value Ref Range Status   MRSA by PCR NEGATIVE NEGATIVE Final    Comment:        The GeneXpert MRSA Assay (FDA approved for NASAL specimens only), is one component of a comprehensive MRSA colonization surveillance program. It is not intended to diagnose MRSA infection nor to guide or monitor treatment for MRSA infections.      Labs: Basic Metabolic Panel:  Recent Labs Lab 06/09/15 1613 06/10/15 0218 06/11/15 0310 06/12/15 0442  NA 137 141 138 142  K 3.9 4.5 4.3 4.6  CL 103 106 106 106  CO2 26  29 25 28   GLUCOSE 112* 120* 109* 106*  BUN 17 16 21* 18  CREATININE 0.73 0.83 0.89 0.77  CALCIUM 9.1 8.6* 9.0 9.7   Liver Function Tests: No results for input(s): AST, ALT, ALKPHOS, BILITOT, PROT, ALBUMIN in the last 168 hours. No results for input(s): LIPASE, AMYLASE in the last 168 hours. No results for input(s): AMMONIA in the last 168 hours. CBC:  Recent Labs Lab 06/09/15 1613 06/10/15 0218 06/11/15 0310 06/12/15 0442  WBC 11.3* 10.9* 12.6* 10.2  HGB 13.7 12.8 13.3 12.8  HCT 42.0 40.2 41.7 39.6  MCV 89.0 89.3 89.5 88.8  PLT 329 291 316 302   Cardiac Enzymes:  Recent Labs Lab 06/09/15 1613 06/10/15 0218 06/10/15 0755  TROPONINI 0.04* 0.05* 0.04*   BNP: BNP (last 3 results) No results for input(s): BNP in the last 8760 hours.  ProBNP (last 3 results) No results for input(s): PROBNP in the last 8760 hours.  CBG: No results for input(s):  GLUCAP in the last 168 hours.     SignedTAHNIA, BETKER  [facility] 06/12/2015, 10:52 AM

## 2015-06-12 NOTE — Progress Notes (Signed)
Pt will have > $400.00 per month copay for xarelto if doesn't have prior auth from [employer], prior auth req number [id number]. Left sticky note for md. Has 30day free card.

## 2015-06-17 DIAGNOSIS — J449 Chronic obstructive pulmonary disease, unspecified: Secondary | ICD-10-CM | POA: Diagnosis not present

## 2015-06-17 DIAGNOSIS — Z1389 Encounter for screening for other disorder: Secondary | ICD-10-CM | POA: Diagnosis not present

## 2015-06-17 DIAGNOSIS — I480 Paroxysmal atrial fibrillation: Secondary | ICD-10-CM | POA: Diagnosis not present

## 2015-07-01 ENCOUNTER — Ambulatory Visit (INDEPENDENT_AMBULATORY_CARE_PROVIDER_SITE_OTHER): Payer: Medicare Other | Admitting: Cardiology

## 2015-07-01 ENCOUNTER — Encounter: Payer: Self-pay | Admitting: Cardiology

## 2015-07-01 VITALS — BP 110/64 | HR 83 | Ht 61.0 in | Wt 199.4 lb

## 2015-07-01 DIAGNOSIS — Z9889 Other specified postprocedural states: Secondary | ICD-10-CM

## 2015-07-01 DIAGNOSIS — Z720 Tobacco use: Secondary | ICD-10-CM | POA: Diagnosis not present

## 2015-07-01 DIAGNOSIS — Z8679 Personal history of other diseases of the circulatory system: Secondary | ICD-10-CM

## 2015-07-01 DIAGNOSIS — I484 Atypical atrial flutter: Secondary | ICD-10-CM | POA: Diagnosis not present

## 2015-07-01 DIAGNOSIS — I421 Obstructive hypertrophic cardiomyopathy: Secondary | ICD-10-CM

## 2015-07-01 NOTE — Progress Notes (Signed)
Cardiology Office Note  Date: 07/01/2015   ID: [name], DOB [birth year], MRN [id number]  PCP: [name]., MD  Evaluating Cardiologist: [name], MD   Chief Complaint  Patient presents with  . Hospitalization Follow-up  . Atrial Flutter    History of Present Illness: Kathleen Wright is a 50 y.o. female presenting for a post hospital follow-up. This is my first meeting with her. I reviewed records regarding recent hospitalization at [facility]. She was seen by our cardiology team, specifically Dr. [name] and Dr. Irish Lack for evaluation of atrial flutter with RVR. History includes hypertrophic cardiomyopathy status post septal myomectomy at [facility] back in 2006, subsequent atrial fibrillation with intolerance to amiodarone, and remote cardioversion. It was anticipated that she would require a subsequent TEE cardioversion for her atrial flutter, however spontaneously converted to sinus rhythm. She was discharged on Cardizem CD 300 mg daily and Xarelto 20 mg daily. CHADSVASC score is 1.  She presents today for a follow-up visit. States that she has had no feelings of palpitations or chest fullness which had been her presenting symptoms. 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  We reviewed her medications which are outlined below. She has had no bleeding problems on Xarelto.  Echocardiogram done in December showed preserved LVEF with no obvious LVOT gradient.  Today we discussed smoking cessation strategies. She states that she is trying to cut back.  Past Medical History  Diagnosis Date  . COPD (chronic obstructive pulmonary disease) ([place])   . Hypertrophic cardiomyopathy ([place])     a. s/p myomectomy at [facility] 08/2004  . Asthma   . Transient atrial fibrillation ([place]) 2006    a. after myomectomy at [facility] in 08/2004 - was on amiodarone but developed elevated LFTs felt possibly secondary to amiodarone   . Paroxysmal atrial flutter (HCC)     a. post-op after myomectomy  in 2006. s/p TEE/DCCV.  Marland Kitchen Cervical cancer ([place])     a. treated with partial removal of cervix    Past Surgical History  Procedure Laterality Date  . Septal myomectomy  2006    [facility]  . Tubal ligation      Current Outpatient Prescriptions  Medication Sig Dispense Refill  . albuterol (PROVENTIL) 2 MG tablet Take 2 mg by mouth daily as needed for wheezing or shortness of breath.     . ALPRAZolam (XANAX) 1 MG tablet Take 1 mg by mouth 3 (three) times daily as needed for anxiety.     Marland Kitchen diltiazem (CARDIZEM CD) 300 MG 24 hr capsule Take 1 capsule (300 mg total) by mouth daily. 30 capsule 0  . HYDROcodone-acetaminophen (NORCO) 10-325 MG tablet Take 1 tablet by mouth every 6 (six) hours as needed for moderate pain or severe pain.     Marland Kitchen ibuprofen (ADVIL,MOTRIN) 800 MG tablet Take 1 tablet by mouth 3 (three) times daily as needed for mild pain or moderate pain.     Marland Kitchen PROAIR HFA 108 (90 Base) MCG/ACT inhaler Inhale 1-2 puffs into the lungs every 6 (six) hours as needed for wheezing or shortness of breath.     . rivaroxaban (XARELTO) 20 MG TABS tablet Take 1 tablet (20 mg total) by mouth daily with supper. 30 tablet 0   No current facility-administered medications for this visit.   Allergies:  Amiodarone   Social History: The patient  reports that she has been smoking Cigarettes.  She has been smoking about 0.50 packs per day. She does not  have any smokeless tobacco history on file. She reports that she does not drink alcohol or use illicit drugs.  ROS:  Please see the history of present illness. Otherwise, complete review of systems is positive for none.  All other systems are reviewed and negative.   Physical Exam: VS:  BP 110/64 mmHg  Pulse 83  Ht 5\' 1"  (1.549 m)  Wt 199 lb 6.4 oz (90.447 kg)  BMI 37.70 kg/m2  SpO2 97%, BMI Body mass index is 37.7 kg/(m^2).  Wt Readings from Last 3 Encounters:  07/01/15 199 lb 6.4 oz (90.447 kg)  06/09/15 215 lb 6.2 oz (97.7 kg)    General: Overweight  woman, appears comfortable at rest. HEENT: Conjunctiva and lids normal, oropharynx clear. Neck: Supple, no elevated JVP or carotid bruits, no thyromegaly. Lungs: Clear to auscultation, nonlabored breathing at rest. Thorax: Well-healed sternal incision. Cardiac: Regular rate and rhythm, no S3 or significant systolic murmur, no pericardial rub. Abdomen: Soft, nontender, bowel sounds present. Extremities: No pitting edema, distal pulses 2+. Skin: Warm and dry. Musculoskeletal: No kyphosis. Neuropsychiatric: Alert and oriented x3, affect grossly appropriate.  ECG: Tracing from 06/12/2015 showed sinus rhythm with probable biatrial enlargement, LVH with IVCD, repolarization abnormalities.  Recent Labwork: 06/10/2015: TSH 1.064 06/12/2015: BUN 18; Creatinine, Ser 0.77; Hemoglobin 12.8; Platelets 302; Potassium 4.6; Sodium 142   Other Studies Reviewed Today:  Echocardiogram 06/10/2015: Study Conclusions  - Left ventricle: Post septal myectomy with scarring mid and apical septum moderately thickened No SAM or LVOT gradient. The cavity size was normal. Wall thickness was increased in a pattern of moderate LVH. Systolic function was normal. The estimated ejection fraction was in the range of 60% to 65%. - Mitral valve: Calcified annulus. There was mild regurgitation. - Left atrium: The atrium was moderately dilated. - Atrial septum: No defect or patent foramen ovale was identified.   Assessment and Plan:  1. History of atypical atrial flutter, clinically stable now in sinus rhythm having spontaneously converted. She will continue on Cardizem CD and Xarelto. If she has significant recurrences, she can be seen again by Dr. [name] to discuss antiarrhythmic or ablation options.  2. History of hypertrophic obstructive cardiomyopathy status post myomectomy at [facility] back in 2006. She remains clinically stable. Recent echocardiogram reviewed without outflow gradient.  3. History of  COPD/asthma with ongoing tobacco abuse. We discussed smoking cessation strategies today. Keep follow-up with Dr. Gerarda Fraction.  4. Remote history of atrial fibrillation with amiodarone intolerance.  Current medicines were reviewed with the patient today.  Disposition: FU with me in 6 months.   Signed, Satira Sark, MD, [facility] 07/01/2015 1:33 PM     [facility] [address], [place], [place] [address] Phone: [phone]; Fax: [phone]

## 2015-07-01 NOTE — Patient Instructions (Signed)
Your physician wants you to follow-up in: 6 Months with Dr. McDowell.  You will receive a reminder letter in the mail two months in advance. If you don't receive a letter, please call our office to schedule the follow-up appointment.  Your physician recommends that you continue on your current medications as directed. Please refer to the Current Medication list given to you today.  If you need a refill on your cardiac medications before your next appointment, please call your pharmacy.  Thank you for choosing [facility]! '  

## 2015-07-02 DIAGNOSIS — Z0001 Encounter for general adult medical examination with abnormal findings: Secondary | ICD-10-CM | POA: Diagnosis not present

## 2015-07-02 DIAGNOSIS — J449 Chronic obstructive pulmonary disease, unspecified: Secondary | ICD-10-CM | POA: Diagnosis not present

## 2015-07-02 DIAGNOSIS — E782 Mixed hyperlipidemia: Secondary | ICD-10-CM | POA: Diagnosis not present

## 2015-07-02 DIAGNOSIS — Z1389 Encounter for screening for other disorder: Secondary | ICD-10-CM | POA: Diagnosis not present

## 2015-07-03 ENCOUNTER — Other Ambulatory Visit: Payer: Self-pay | Admitting: Internal Medicine

## 2015-07-03 DIAGNOSIS — Z1231 Encounter for screening mammogram for malignant neoplasm of breast: Secondary | ICD-10-CM

## 2015-07-09 ENCOUNTER — Encounter (INDEPENDENT_AMBULATORY_CARE_PROVIDER_SITE_OTHER): Payer: Self-pay | Admitting: *Deleted

## 2015-07-15 ENCOUNTER — Other Ambulatory Visit (HOSPITAL_COMMUNITY): Payer: Self-pay | Admitting: Internal Medicine

## 2015-07-15 DIAGNOSIS — Z1231 Encounter for screening mammogram for malignant neoplasm of breast: Secondary | ICD-10-CM

## 2015-07-22 ENCOUNTER — Ambulatory Visit (HOSPITAL_COMMUNITY)
Admission: RE | Admit: 2015-07-22 | Discharge: 2015-07-22 | Disposition: A | Discharge status: Home / Self Care | Payer: Medicare Other | Source: Ambulatory Visit | Attending: Internal Medicine | Admitting: Internal Medicine

## 2015-07-22 DIAGNOSIS — Z1231 Encounter for screening mammogram for malignant neoplasm of breast: Secondary | ICD-10-CM | POA: Diagnosis not present

## 2015-07-23 ENCOUNTER — Ambulatory Visit: Payer: Medicare Other

## 2015-08-10 ENCOUNTER — Telehealth: Payer: Self-pay | Admitting: *Deleted

## 2015-08-10 ENCOUNTER — Observation Stay (HOSPITAL_COMMUNITY)
Admission: EM | Admit: 2015-08-10 | Discharge: 2015-08-13 | Disposition: A | Discharge status: Home / Self Care | Payer: Medicare Other | Attending: Family Medicine | Admitting: Family Medicine

## 2015-08-10 ENCOUNTER — Encounter (HOSPITAL_COMMUNITY): Payer: Self-pay | Admitting: *Deleted

## 2015-08-10 ENCOUNTER — Emergency Department (HOSPITAL_COMMUNITY): Payer: Medicare Other

## 2015-08-10 DIAGNOSIS — J45909 Unspecified asthma, uncomplicated: Secondary | ICD-10-CM | POA: Insufficient documentation

## 2015-08-10 DIAGNOSIS — J441 Chronic obstructive pulmonary disease with (acute) exacerbation: Secondary | ICD-10-CM

## 2015-08-10 DIAGNOSIS — R0602 Shortness of breath: Secondary | ICD-10-CM | POA: Diagnosis not present

## 2015-08-10 DIAGNOSIS — Z720 Tobacco use: Secondary | ICD-10-CM | POA: Diagnosis present

## 2015-08-10 DIAGNOSIS — Z79899 Other long term (current) drug therapy: Secondary | ICD-10-CM | POA: Insufficient documentation

## 2015-08-10 DIAGNOSIS — J449 Chronic obstructive pulmonary disease, unspecified: Secondary | ICD-10-CM | POA: Diagnosis present

## 2015-08-10 DIAGNOSIS — Z8541 Personal history of malignant neoplasm of cervix uteri: Secondary | ICD-10-CM | POA: Insufficient documentation

## 2015-08-10 DIAGNOSIS — I480 Paroxysmal atrial fibrillation: Secondary | ICD-10-CM | POA: Diagnosis not present

## 2015-08-10 DIAGNOSIS — Z8679 Personal history of other diseases of the circulatory system: Secondary | ICD-10-CM

## 2015-08-10 DIAGNOSIS — R002 Palpitations: Secondary | ICD-10-CM | POA: Diagnosis present

## 2015-08-10 DIAGNOSIS — R079 Chest pain, unspecified: Principal | ICD-10-CM | POA: Insufficient documentation

## 2015-08-10 DIAGNOSIS — F17210 Nicotine dependence, cigarettes, uncomplicated: Secondary | ICD-10-CM | POA: Diagnosis not present

## 2015-08-10 DIAGNOSIS — Z7901 Long term (current) use of anticoagulants: Secondary | ICD-10-CM | POA: Insufficient documentation

## 2015-08-10 DIAGNOSIS — I484 Atypical atrial flutter: Secondary | ICD-10-CM | POA: Diagnosis present

## 2015-08-10 DIAGNOSIS — I421 Obstructive hypertrophic cardiomyopathy: Secondary | ICD-10-CM | POA: Diagnosis present

## 2015-08-10 DIAGNOSIS — R000 Tachycardia, unspecified: Secondary | ICD-10-CM | POA: Diagnosis not present

## 2015-08-10 DIAGNOSIS — I4892 Unspecified atrial flutter: Secondary | ICD-10-CM | POA: Insufficient documentation

## 2015-08-10 LAB — RAPID URINE DRUG SCREEN, HOSP PERFORMED
AMPHETAMINES: NOT DETECTED
BARBITURATES: NOT DETECTED
BENZODIAZEPINES: NOT DETECTED
Cocaine: NOT DETECTED
Opiates: NOT DETECTED
TETRAHYDROCANNABINOL: NOT DETECTED

## 2015-08-10 LAB — URINALYSIS, ROUTINE W REFLEX MICROSCOPIC
BILIRUBIN URINE: NEGATIVE
Glucose, UA: NEGATIVE mg/dL
HGB URINE DIPSTICK: NEGATIVE
KETONES UR: NEGATIVE mg/dL
Nitrite: NEGATIVE
PROTEIN: NEGATIVE mg/dL
Specific Gravity, Urine: 1.015 (ref 1.005–1.030)
pH: 5.5 (ref 5.0–8.0)

## 2015-08-10 LAB — URINE MICROSCOPIC-ADD ON

## 2015-08-10 LAB — BASIC METABOLIC PANEL
ANION GAP: 8 (ref 5–15)
BUN: 19 mg/dL (ref 6–20)
CALCIUM: 9.1 mg/dL (ref 8.9–10.3)
CO2: 25 mmol/L (ref 22–32)
Chloride: 104 mmol/L (ref 101–111)
Creatinine, Ser: 1.18 mg/dL — ABNORMAL HIGH (ref 0.44–1.00)
GFR calc Af Amer: >60 mL/min (ref >60)
GFR calc non Af Amer: 53 mL/min — ABNORMAL LOW (ref >60)
GLUCOSE: 136 mg/dL — AB (ref 65–99)
Potassium: 3.7 mmol/L (ref 3.5–5.1)
Sodium: 137 mmol/L (ref 135–145)

## 2015-08-10 LAB — CBC
HCT: 41.7 % (ref 36.0–46.0)
HEMOGLOBIN: 14.1 g/dL (ref 12.0–15.0)
MCH: 30 pg (ref 26.0–34.0)
MCHC: 33.8 g/dL (ref 30.0–36.0)
MCV: 88.7 fL (ref 78.0–100.0)
Platelets: 343 10*3/uL (ref 150–400)
RBC: 4.7 MIL/uL (ref 3.87–5.11)
RDW: 14.5 % (ref 11.5–15.5)
WBC: 14.7 10*3/uL — ABNORMAL HIGH (ref 4.0–10.5)

## 2015-08-10 LAB — TROPONIN I: TROPONIN I: 0.03 ng/mL (ref <0.031)

## 2015-08-10 LAB — TSH: TSH: 1.57 u[IU]/mL (ref 0.350–4.500)

## 2015-08-10 MED ORDER — DILTIAZEM HCL 100 MG IV SOLR
5.0000 mg/h | Freq: Once | INTRAVENOUS | Status: AC
  Administered 2015-08-10: 5 mg/h via INTRAVENOUS
  Filled 2015-08-10: qty 100

## 2015-08-10 MED ORDER — ADENOSINE 6 MG/2ML IV SOLN
6.0000 mg | Freq: Once | INTRAVENOUS | Status: AC
  Administered 2015-08-10: 6 mg via INTRAVENOUS
  Filled 2015-08-10: qty 2

## 2015-08-10 MED ORDER — SODIUM CHLORIDE 0.9 % IV BOLUS (SEPSIS)
1000.0000 mL | Freq: Once | INTRAVENOUS | Status: AC
Start: 2015-08-10 — End: 2015-08-10
  Administered 2015-08-10: 1000 mL via INTRAVENOUS

## 2015-08-10 MED ORDER — DILTIAZEM HCL 25 MG/5ML IV SOLN
15.0000 mg | Freq: Once | INTRAVENOUS | Status: AC
  Administered 2015-08-10: 15 mg via INTRAVENOUS
  Filled 2015-08-10: qty 5

## 2015-08-10 MED ORDER — ADENOSINE 6 MG/2ML IV SOLN
INTRAVENOUS | Status: AC
Start: 2015-08-10 — End: 2015-08-10
  Administered 2015-08-10: 6 mg via INTRAVENOUS
  Filled 2015-08-10: qty 4

## 2015-08-10 MED ORDER — SODIUM CHLORIDE 0.9 % IV BOLUS (SEPSIS)
1000.0000 mL | Freq: Once | INTRAVENOUS | Status: AC
  Administered 2015-08-10: 1000 mL via INTRAVENOUS

## 2015-08-10 MED ORDER — DILTIAZEM HCL 25 MG/5ML IV SOLN
20.0000 mg | Freq: Once | INTRAVENOUS | Status: AC
  Administered 2015-08-10: 20 mg via INTRAVENOUS

## 2015-08-10 NOTE — H&P (Signed)
[facility] History and Physical  Kathleen Wright [id number] DOB: Oct 05, 1965    PCP:   [name]., MD   Chief Complaint: palpitation, found to have afib with RVR.   HPI: Kathleen Wright is an 50 y.o. female with hx of hypertrophic CMP, s/p myomectomy 2006 at [facility], asthma, COPD with 30 py cig abuse and continue to smoke, hx of afib on Xarelto CHADSVASC 1, presented to the ER with palpitation and SOB around 2pm today.  She denied CP, but has SOB.  No N/V.  She doesn't consume alcohol, and has not missed her cardizem pills.  In the ER, she was given IV Adenosine, revealing aflutters, and IV cardiazem.  Her rate was not controlled, so she was given IV Drip of cardiazem, and hospitalist was asked to admit her for further Tx.  Her CXR was clear, and serology showed normal K and WBC of 14K.  She admitted to having wheezing and productive coughs.    Rewiew of Systems:  Constitutional: Negative for malaise, fever and chills. No significant weight loss or weight gain Eyes: Negative for eye pain, redness and discharge, diplopia, visual changes, or flashes of light. ENMT: Negative for ear pain, hoarseness, nasal congestion, sinus pressure and sore throat. No headaches; tinnitus, drooling, or problem swallowing. Cardiovascular: Negative for chest pain,  diaphoresis,  and peripheral edema. ; No orthopnea, PND Respiratory: Negative for cough, hemoptysis, wheezing and stridor. No pleuritic chestpain. Gastrointestinal: Negative for nausea, vomiting, diarrhea, constipation, abdominal pain, melena, blood in stool, hematemesis, jaundice and rectal bleeding.    Genitourinary: Negative for frequency, dysuria, incontinence,flank pain and hematuria; Musculoskeletal: Negative for back pain and neck pain. Negative for swelling and trauma.;  Skin: . Negative for pruritus, rash, abrasions, bruising and skin lesion.; ulcerations Neuro: Negative for headache, lightheadedness and neck stiffness. Negative for  weakness, altered level of consciousness , altered mental status, extremity weakness, burning feet, involuntary movement, seizure and syncope.  Psych: negative for anxiety, depression, insomnia, tearfulness, panic attacks, hallucinations, paranoia, suicidal or homicidal ideation    Past Medical History  Diagnosis Date  . COPD (chronic obstructive pulmonary disease) ([place])   . Hypertrophic cardiomyopathy ([place])     a. s/p myomectomy at [facility] 08/2004  . Asthma   . Transient atrial fibrillation ([place]) 2006    a. after myomectomy at [facility] in 08/2004 - was on amiodarone but developed elevated LFTs felt possibly secondary to amiodarone   . Paroxysmal atrial flutter (HCC)     a. post-op after myomectomy in 2006. s/p TEE/DCCV.  Marland Kitchen Cervical cancer ([place])     a. treated with partial removal of cervix    Past Surgical History  Procedure Laterality Date  . Septal myomectomy  2006    [facility]  . Tubal ligation      Medications:  HOME MEDS: Prior to Admission medications   Medication Sig Start Date End Date Taking? Authorizing Provider  ALPRAZolam Duanne Moron) 1 MG tablet Take 1 mg by mouth 3 (three) times daily as needed for anxiety.  05/27/15  Yes Historical Provider, MD  diltiazem (CARDIZEM CD) 300 MG 24 hr capsule Take 1 capsule (300 mg total) by mouth daily. 06/12/15  Yes Maryann Mikhail, DO  HYDROcodone-acetaminophen (NORCO) 10-325 MG tablet Take 1 tablet by mouth every 6 (six) hours as needed for moderate pain or severe pain.  05/27/15  Yes Historical Provider, MD  ibuprofen (ADVIL,MOTRIN) 800 MG tablet Take 1 tablet by mouth 3 (three) times daily as needed for mild pain or moderate  pain.  05/06/15  Yes Historical Provider, MD  PROAIR HFA 108 (90 Base) MCG/ACT inhaler Inhale 1-2 puffs into the lungs every 6 (six) hours as needed for wheezing or shortness of breath.  05/27/15  Yes Historical Provider, MD  rivaroxaban (XARELTO) 20 MG TABS tablet Take 1 tablet (20 mg total) by mouth daily with supper.  06/12/15  Yes Maryann Mikhail, DO     Allergies:  Allergies  Allergen Reactions  . Amiodarone Other (See Comments)    Liver function    Social History:   reports that she has been smoking Cigarettes.  She has been smoking about 0.50 packs per day. She does not have any smokeless tobacco history on file. She reports that she does not drink alcohol or use illicit drugs.  Family History: Family History  Problem Relation Age of Onset  . Heart failure Father   . Diabetes    . Lung disease       Physical Exam: Filed Vitals:   08/10/15 2030 08/10/15 2045 08/10/15 2130 08/10/15 2200  BP: 114/99 104/92 114/82 110/68  Pulse: 117 132 120 122  Temp:      TempSrc:      Resp: 19 23 17 18   Height:      Weight:      SpO2: 99% 98% 97% 98%   Blood pressure 110/68, pulse 122, temperature 98.6 F (37 C), temperature source Oral, resp. rate 18, height 5\' 1"  (1.549 m), weight 90.719 kg (200 lb), SpO2 98 %.  GEN:  Pleasant  patient lying in the stretcher in no acute distress; cooperative with exam. PSYCH:  alert and oriented x4; does not appear anxious or depressed; affect is appropriate. HEENT: Mucous membranes pink and anicteric; PERRLA; EOM intact; no cervical lymphadenopathy nor thyromegaly or carotid bruit; no JVD; There were no stridor. Neck is very supple. Breasts:: Not examined CHEST WALL: No tenderness CHEST: Normal respiration, diffuse wheezing bilaterally.  HEART: Regular rate and rhythm.  There are no murmur, rub, or gallops.  Tachycardia.  BACK: No kyphosis or scoliosis; no CVA tenderness ABDOMEN: soft and non-tender; no masses, no organomegaly, normal abdominal bowel sounds; no pannus; no intertriginous candida. There is no rebound and no distention. Rectal Exam: Not done EXTREMITIES: No bone or joint deformity; age-appropriate arthropathy of the hands and knees; no edema; no ulcerations.  There is no calf tenderness. Genitalia: not examined PULSES: 2+ and symmetric SKIN:  Normal hydration no rash or ulceration CNS: Cranial nerves 2-12 grossly intact no focal lateralizing neurologic deficit.  Speech is fluent; uvula elevated with phonation, facial symmetry and tongue midline. DTR are normal bilaterally, cerebella exam is intact, barbinski is negative and strengths are equaled bilaterally.  No sensory loss.   Labs on Admission:  Basic Metabolic Panel:  Recent Labs Lab 08/10/15 1745  NA 137  K 3.7  CL 104  CO2 25  GLUCOSE 136*  BUN 19  CREATININE 1.18*  CALCIUM 9.1   Liver Function Tests: CBC:  Recent Labs Lab 08/10/15 1745  WBC 14.7*  HGB 14.1  HCT 41.7  MCV 88.7  PLT 343   Cardiac Enzymes:  Recent Labs Lab 08/10/15 1745  TROPONINI 0.03   Radiological Exams on Admission: Dg Chest 2 View  08/10/2015  CLINICAL DATA:  Tachycardia, chest pressure, asthma EXAM: CHEST  2 VIEW COMPARISON:  06/09/2015 FINDINGS: Lungs are clear.  No pleural effusion or pneumothorax. Cardiomegaly. Mild degenerative changes of the mid thoracic spine. Median sternotomy. IMPRESSION: No evidence of acute cardiopulmonary disease.  Electronically Signed   By: Julian Hy M.D.   On: 08/10/2015 18:09    EKG: Independently reviewed.    Assessment/Plan Present on Admission:  . Atrial flutter with rapid ventricular response ([place]) . COPD (chronic obstructive pulmonary disease) ([place]) . Hypertrophic obstructive cardiomyopathy ([place]) . Tobacco abuse . Paroxysmal a-fib (HCC)  PLAN:    PAF with RVR:  Will continue with IV Cardiazem drip.  Will admit to SDU.  Continue with Xarelto.  COPD exacerbation:  Will give Xopinex neb, along with oral Prednisone and Levaquin.  Will add PPI to her regimen.  Tobacco abuse:  She doesn't want Nicotine patches.  Advised cessation.  Unclear if she is ready to quit.   Other plans as per orders. Code Status: FULL Haskel Khan, MD. FACP [facility] Pager [phone] 7pm to 7am.  08/10/2015, 10:35 PM

## 2015-08-10 NOTE — ED Notes (Signed)
Pt states she began having a "racing heart beat" around 1445 today and she began to have chest pain. She describes this as tight feeling.

## 2015-08-10 NOTE — ED Provider Notes (Signed)
CSN: [id number]     Arrival date & time 08/10/15  1714 History   First MD Initiated Contact with Patient 08/10/15 1723     Chief Complaint  Patient presents with  . Tachycardia     (Consider location/radiation/quality/duration/timing/severity/associated sxs/prior Treatment) Patient is a 50 y.o. female presenting with palpitations.  Palpitations Palpitations quality:  Regular Onset quality:  Sudden Duration:  3 hours Timing:  Constant Chronicity:  Recurrent Context: not anxiety, not caffeine, not dehydration, not illicit drugs, not nicotine and not stimulant use   Relieved by:  None tried Worsened by:  Nothing Ineffective treatments:  None tried Associated symptoms: chest pain and chest pressure   Associated symptoms: no back pain, no cough, no nausea, no shortness of breath and no vomiting   Risk factors: hx of atrial fibrillation     Past Medical History  Diagnosis Date  . COPD (chronic obstructive pulmonary disease) ([place])   . Hypertrophic cardiomyopathy ([place])     a. s/p myomectomy at [facility] 08/2004  . Asthma   . Transient atrial fibrillation ([place]) 2006    a. after myomectomy at [facility] in 08/2004 - was on amiodarone but developed elevated LFTs felt possibly secondary to amiodarone   . Paroxysmal atrial flutter (HCC)     a. post-op after myomectomy in 2006. s/p TEE/DCCV.  Marland Kitchen Cervical cancer ([place])     a. treated with partial removal of cervix   Past Surgical History  Procedure Laterality Date  . Septal myomectomy  2006    [facility]  . Tubal ligation     Family History  Problem Relation Age of Onset  . Heart failure Father   . Diabetes    . Lung disease     Social History  Substance Use Topics  . Smoking status: Current Every Day Smoker -- 0.50 packs/day    Types: Cigarettes  . Smokeless tobacco: None     Comment: since age 55  . Alcohol Use: No   OB History    No data available     Review of Systems  Constitutional: Negative for chills and activity change.  HENT:  Negative for congestion and drooling.   Eyes: Negative for pain.  Respiratory: Negative for cough and shortness of breath.   Cardiovascular: Positive for chest pain and palpitations. Negative for leg swelling.  Gastrointestinal: Negative for nausea, vomiting and abdominal pain.  Musculoskeletal: Negative for back pain and gait problem.  All other systems reviewed and are negative.     Allergies  Amiodarone  Home Medications   Prior to Admission medications   Medication Sig Start Date End Date Taking? Authorizing Provider  ALPRAZolam Duanne Moron) 1 MG tablet Take 1 mg by mouth 3 (three) times daily as needed for anxiety.  05/27/15  Yes Historical Provider, MD  diltiazem (CARDIZEM CD) 300 MG 24 hr capsule Take 1 capsule (300 mg total) by mouth daily. 06/12/15  Yes Maryann Mikhail, DO  HYDROcodone-acetaminophen (NORCO) 10-325 MG tablet Take 1 tablet by mouth every 6 (six) hours as needed for moderate pain or severe pain.  05/27/15  Yes Historical Provider, MD  ibuprofen (ADVIL,MOTRIN) 800 MG tablet Take 1 tablet by mouth 3 (three) times daily as needed for mild pain or moderate pain.  05/06/15  Yes Historical Provider, MD  PROAIR HFA 108 (90 Base) MCG/ACT inhaler Inhale 1-2 puffs into the lungs every 6 (six) hours as needed for wheezing or shortness of breath.  05/27/15  Yes Historical Provider, MD  rivaroxaban (XARELTO) 20 MG TABS tablet  Take 1 tablet (20 mg total) by mouth daily with supper. 06/12/15  Yes Maryann Mikhail, DO   BP 117/88 mmHg  Pulse 119  Temp(Src) 98.6 F (37 C) (Oral)  Resp 27  Ht 5\' 1"  (1.549 m)  Wt 200 lb (90.719 kg)  BMI 37.81 kg/m2  SpO2 100% Physical Exam  Constitutional: She appears well-developed and well-nourished.  HENT:  Head: Normocephalic and atraumatic.  Eyes: Conjunctivae are normal. Pupils are equal, round, and reactive to light.  Neck: Normal range of motion.  Cardiovascular: Regular rhythm.  Tachycardia present.   Pulmonary/Chest: Effort normal and  breath sounds normal. No stridor. No respiratory distress. She has no wheezes. She has no rales. She exhibits no tenderness.  Abdominal: She exhibits no distension. There is no tenderness.  Musculoskeletal: She exhibits no edema or tenderness.  Neurological: She is alert.  Skin: Skin is warm and dry.  Nursing note and vitals reviewed.   ED Course  Procedures (including critical care time)  CRITICAL CARE Performed by: Merrily Pew   Total critical care time: 30 minutes  Critical care time was exclusive of separately billable procedures and treating other patients.  Critical care was necessary to treat or prevent imminent or life-threatening deterioration.  Critical care was time spent personally by me on the following activities: development of treatment plan with patient and/or surrogate as well as nursing, discussions with consultants, evaluation of patient's response to treatment, examination of patient, obtaining history from patient or surrogate, ordering and performing treatments and interventions, ordering and review of laboratory studies, ordering and review of radiographic studies, pulse oximetry and re-evaluation of patient's condition.   Labs Review Labs Reviewed  BASIC METABOLIC PANEL - Abnormal; Notable for the following:    Glucose, Bld 136 (*)    Creatinine, Ser 1.18 (*)    GFR calc non Af Amer 53 (*)    All other components within normal limits  CBC - Abnormal; Notable for the following:    WBC 14.7 (*)    All other components within normal limits  URINALYSIS, ROUTINE W REFLEX MICROSCOPIC (NOT AT [facility]) - Abnormal; Notable for the following:    Color, Urine STRAW (*)    Leukocytes, UA TRACE (*)    All other components within normal limits  URINE MICROSCOPIC-ADD ON - Abnormal; Notable for the following:    Squamous Epithelial / LPF 6-30 (*)    Bacteria, UA RARE (*)    All other components within normal limits  TROPONIN I  TSH  URINE RAPID DRUG SCREEN, HOSP  PERFORMED    Imaging Review Dg Chest 2 View  08/10/2015  CLINICAL DATA:  Tachycardia, chest pressure, asthma EXAM: CHEST  2 VIEW COMPARISON:  06/09/2015 FINDINGS: Lungs are clear.  No pleural effusion or pneumothorax. Cardiomegaly. Mild degenerative changes of the mid thoracic spine. Median sternotomy. IMPRESSION: No evidence of acute cardiopulmonary disease. Electronically Signed   By: Julian Hy M.D.   On: 08/10/2015 18:09   I have personally reviewed and evaluated these images and lab results as part of my medical decision-making.   EKG Interpretation   Date/Time:  Monday August 10 2015 17:16:08 EST Ventricular Rate:  131 PR Interval:  152 QRS Duration: 122 QT Interval:  306 QTC Calculation: 451 R Axis:   26 Text Interpretation:  Sinus tachycardia with occasional Premature  ventricular complexes Septal infarct , age undetermined ST \\T \ T wave  abnormality, consider lateral ischemia Abnormal ECG ED PHYSICIAN  INTERPRETATION AVAILABLE IN [facility] Confirmed by TEST,  Record  ([id number]) on 08/11/2015 6:54:59 AM      MDM   Final diagnoses:  Paroxysmal a-fib (HCC)  Atrial flutter with rapid ventricular response ([place])    50 yo F w/ h/o aflutter and HOCM s/p myomectomy here with aflutter w/ rvr. Initially appeared to be sinus rhythm, so attempted to give fluids and reassess for improvement. However no improvement, so adenosine given in order to evaluate underlying rhythm and appeared to be aflutter. Bolus of diltiazem did not affect, so larger bolus and a drip started and admitted to medicine for further titration.     Merrily Pew, MD 08/12/15 1100

## 2015-08-10 NOTE — Telephone Encounter (Signed)
Patient called and c/o SOB, HR of 144 and BP 153/106. Patient states onset is 3:45 today. Patient encouraged to be seen in the ED. Patient voiced understanding. Will forward to Dr. [name] for review.

## 2015-08-10 NOTE — ED Notes (Signed)
Unable to draw labs and IV access, phlebotomy at bedside.

## 2015-08-11 ENCOUNTER — Encounter (HOSPITAL_COMMUNITY): Payer: Self-pay | Admitting: *Deleted

## 2015-08-11 DIAGNOSIS — Z8679 Personal history of other diseases of the circulatory system: Secondary | ICD-10-CM | POA: Diagnosis not present

## 2015-08-11 DIAGNOSIS — I4892 Unspecified atrial flutter: Secondary | ICD-10-CM | POA: Diagnosis not present

## 2015-08-11 DIAGNOSIS — I421 Obstructive hypertrophic cardiomyopathy: Secondary | ICD-10-CM | POA: Diagnosis not present

## 2015-08-11 DIAGNOSIS — J441 Chronic obstructive pulmonary disease with (acute) exacerbation: Secondary | ICD-10-CM | POA: Diagnosis not present

## 2015-08-11 LAB — MRSA PCR SCREENING: MRSA by PCR: NEGATIVE

## 2015-08-11 MED ORDER — PANTOPRAZOLE SODIUM 40 MG PO TBEC
40.0000 mg | DELAYED_RELEASE_TABLET | Freq: Every day | ORAL | Status: DC
  Administered 2015-08-11 – 2015-08-13 (×3): 40 mg via ORAL
  Filled 2015-08-11 (×3): qty 1

## 2015-08-11 MED ORDER — HYDROCODONE-ACETAMINOPHEN 10-325 MG PO TABS: 1.0000 | ORAL_TABLET | Freq: Four times a day (QID) | ORAL | Status: DC | PRN

## 2015-08-11 MED ORDER — DILTIAZEM HCL 100 MG IV SOLR: 5.0000 mg/h | Freq: Once | INTRAVENOUS | Status: AC

## 2015-08-11 MED ORDER — PREDNISONE 20 MG PO TABS
40.0000 mg | ORAL_TABLET | Freq: Every day | ORAL | Status: DC
  Administered 2015-08-11: 40 mg via ORAL
  Filled 2015-08-11: qty 2

## 2015-08-11 MED ORDER — DILTIAZEM HCL ER COATED BEADS 300 MG PO CP24
300.0000 mg | ORAL_CAPSULE | Freq: Every day | ORAL | Status: DC
  Administered 2015-08-11 – 2015-08-12 (×2): 300 mg via ORAL
  Filled 2015-08-11 (×2): qty 1

## 2015-08-11 MED ORDER — DILTIAZEM HCL 100 MG IV SOLR
INTRAVENOUS | Status: AC
  Administered 2015-08-11: 08:00:00
  Filled 2015-08-11: qty 100

## 2015-08-11 MED ORDER — DEXTROSE 5 % IV SOLN
5.0000 mg/h | Freq: Once | INTRAVENOUS | Status: AC
  Administered 2015-08-11: 15 mg/h via INTRAVENOUS
  Filled 2015-08-11: qty 100

## 2015-08-11 MED ORDER — LEVOFLOXACIN 500 MG PO TABS
500.0000 mg | ORAL_TABLET | Freq: Every day | ORAL | Status: DC
  Administered 2015-08-11 – 2015-08-12 (×3): 500 mg via ORAL
  Filled 2015-08-11 (×3): qty 1

## 2015-08-11 MED ORDER — RIVAROXABAN 20 MG PO TABS
20.0000 mg | ORAL_TABLET | Freq: Every day | ORAL | Status: DC
  Administered 2015-08-11 – 2015-08-12 (×2): 20 mg via ORAL
  Filled 2015-08-11 (×2): qty 1

## 2015-08-11 MED ORDER — SODIUM CHLORIDE 0.9% FLUSH
3.0000 mL | Freq: Two times a day (BID) | INTRAVENOUS | Status: DC
  Administered 2015-08-11 – 2015-08-12 (×2): 3 mL via INTRAVENOUS

## 2015-08-11 MED ORDER — ALPRAZOLAM 0.5 MG PO TABS: 1.0000 mg | ORAL_TABLET | Freq: Three times a day (TID) | ORAL | Status: DC | PRN

## 2015-08-11 MED ORDER — LEVALBUTEROL HCL 0.63 MG/3ML IN NEBU
0.6300 mg | INHALATION_SOLUTION | Freq: Four times a day (QID) | RESPIRATORY_TRACT | Status: DC
  Administered 2015-08-11 – 2015-08-12 (×6): 0.63 mg via RESPIRATORY_TRACT
  Filled 2015-08-11 (×4): qty 3

## 2015-08-11 NOTE — Care Management Obs Status (Signed)
[facility] NOTIFICATION   Patient Details  Name: RAMIA MILLENDER MRN: [id number] Date of Birth: Jan 11, 1966   Medicare Observation Status Notification Given:  Yes    Sherald Barge, RN 08/11/2015, 3:54 PM

## 2015-08-11 NOTE — Progress Notes (Signed)
Patient's daughter at bedside requesting patient be seen by Cardiologist. Patient states she does not understand why her Cardizem dose not being adjusted from her home dose. Notified MD regarding request for consult.

## 2015-08-11 NOTE — Progress Notes (Signed)
[facility] PROGRESS NOTE  SHENG INBODEN [id number] DOB: Sep 07, 1965 DOA: 08/10/2015 PCP: [name]., MD  Assessment/Plan: Atrial fibrillation with rapid ventricular response -Currently rate controlled. -Will start her home dose of oral Cardizem with plans to transition off IV Cardizem. -Continue Xarelto for anticoagulation. -Patient has a history of hypertensive cardiomyopathy treated at [facility] in 2006 with a septal myomectomy. She did develop postoperative atrial fibrillation which was treated with cardioversion at that time.  COPD with acute exacerbation -Currently no wheezings, lung sounds are clear. -Continue Levaquin for 5 days, start titrating prednisone.   Tobacco abuse -Counseled on cessation, does not appear interested in quitting at this time.  Code Status: Full code Family Communication: Patient only  Disposition Plan: Anticipate discharge home in 24 hours   Consultants:  None   Antibiotics:  Levaquin   Subjective: Feels well, once IV out, anxious to discharge home  Objective: Filed Vitals:   08/11/15 0700 08/11/15 0753 08/11/15 0800 08/11/15 0900  BP: 97/68  100/77 91/61  Pulse: 107  147 61  Temp:      TempSrc:      Resp: 18  21 16   Height:      Weight:      SpO2: 96% 97% 97% 98%    Intake/Output Summary (Last 24 hours) at 08/11/15 1032 Last data filed at 08/11/15 0651  Gross per 24 hour  Intake      0 ml  Output   1750 ml  Net  -1750 ml   Filed Weights   08/10/15 1722 08/11/15 0009 08/11/15 0500  Weight: 90.719 kg (200 lb) 93 kg (205 lb 0.4 oz) 93 kg (205 lb 0.4 oz)    Exam:   General:  Alert, awake, oriented 3  Cardiovascular: Irregular rhythm, no murmurs, rubs or gallops  Respiratory: Clear to auscultation bilaterally  Abdomen: Soft, nontender, nondistended, positive bowel sounds  Extremities: No clubbing, cyanosis or edema, positive pulses   Neurologic:  Grossly intact and nonfocal  Data  Reviewed: Basic Metabolic Panel:  Recent Labs Lab 08/10/15 1745  NA 137  K 3.7  CL 104  CO2 25  GLUCOSE 136*  BUN 19  CREATININE 1.18*  CALCIUM 9.1   Liver Function Tests: No results for input(s): AST, ALT, ALKPHOS, BILITOT, PROT, ALBUMIN in the last 168 hours. No results for input(s): LIPASE, AMYLASE in the last 168 hours. No results for input(s): AMMONIA in the last 168 hours. CBC:  Recent Labs Lab 08/10/15 1745  WBC 14.7*  HGB 14.1  HCT 41.7  MCV 88.7  PLT 343   Cardiac Enzymes:  Recent Labs Lab 08/10/15 1745  TROPONINI 0.03   BNP (last 3 results) No results for input(s): BNP in the last 8760 hours.  ProBNP (last 3 results) No results for input(s): PROBNP in the last 8760 hours.  CBG: No results for input(s): GLUCAP in the last 168 hours.  No results found for this or any previous visit (from the past 240 hour(s)).   Studies: Dg Chest 2 View  08/10/2015  CLINICAL DATA:  Tachycardia, chest pressure, asthma EXAM: CHEST  2 VIEW COMPARISON:  06/09/2015 FINDINGS: Lungs are clear.  No pleural effusion or pneumothorax. Cardiomegaly. Mild degenerative changes of the mid thoracic spine. Median sternotomy. IMPRESSION: No evidence of acute cardiopulmonary disease. Electronically Signed   By: Julian Hy M.D.   On: 08/10/2015 18:09    Scheduled Meds: . diltiazem  300 mg Oral Daily  . diltiazem (CARDIZEM) infusion  5-15 mg/hr Intravenous  Once  . levalbuterol  0.63 mg Nebulization Q6H  . levofloxacin  500 mg Oral Daily  . pantoprazole  40 mg Oral Q0600  . predniSONE  40 mg Oral QAC breakfast  . rivaroxaban  20 mg Oral Q supper  . sodium chloride flush  3 mL Intravenous Q12H   Continuous Infusions:   Principal Problem:   Atrial flutter with rapid ventricular response (HCC) Active Problems:   Hypertrophic obstructive cardiomyopathy (HCC)   History of atrial fibrillation   COPD (chronic obstructive pulmonary disease) (HCC)   Tobacco abuse   COPD  exacerbation (HCC)   Paroxysmal a-fib (HCC)    Time spent: 25 minutes. Greater than 50% of this time was spent in direct contact with the patient coordinating care.    Lelon Frohlich  [facility] Pager [phone]  If 7PM-7AM, please contact night-coverage at [email or web address], password [facility] 08/11/2015, 10:32 AM

## 2015-08-11 NOTE — Progress Notes (Signed)
Cardizem drip restarted patient HR elevated 130.

## 2015-08-11 NOTE — Care Management Note (Signed)
Case Management Note  Patient Details  Name: Kathleen Wright MRN: [id number] Date of Birth: 10/31/1965  Subjective/Objective:                  Pt admitted with a-fib. Pt is from home, lives with husband and is ind with ADL's. Pt has no HH services or DME prior to admission. Pt has PCP and insurance. Pt is on chronic anticoagulation prior to admission. Pt is able to afford her medications. Pt plans to return home with self care at DC.   Action/Plan: No CM needs anticipated.   Expected Discharge Date:    08/12/2015              Expected Discharge Plan:  Home/Self Care  In-House Referral:  NA  Discharge planning Services  CM Consult  Post Acute Care Choice:  NA Choice offered to:  NA  DME Arranged:    DME Agency:     HH Arranged:    HH Agency:     Status of Service:  Completed, signed off  Medicare Important Message Given:    Date Medicare IM Given:    Medicare IM give by:    Date Additional Medicare IM Given:    Additional Medicare Important Message give by:     If discussed at [place] of Stay Meetings, dates discussed:    Additional Comments:  Sherald Barge, RN 08/11/2015, 3:55 PM

## 2015-08-12 ENCOUNTER — Encounter (HOSPITAL_COMMUNITY)
Admission: EM | Disposition: A | Discharge status: Home / Self Care | Payer: Self-pay | Source: Home / Self Care | Attending: Emergency Medicine

## 2015-08-12 ENCOUNTER — Observation Stay (HOSPITAL_COMMUNITY): Payer: Medicare Other | Admitting: Anesthesiology

## 2015-08-12 ENCOUNTER — Encounter (HOSPITAL_COMMUNITY): Payer: Self-pay | Admitting: *Deleted

## 2015-08-12 DIAGNOSIS — J449 Chronic obstructive pulmonary disease, unspecified: Secondary | ICD-10-CM | POA: Diagnosis not present

## 2015-08-12 DIAGNOSIS — Z8679 Personal history of other diseases of the circulatory system: Secondary | ICD-10-CM | POA: Diagnosis not present

## 2015-08-12 DIAGNOSIS — R079 Chest pain, unspecified: Secondary | ICD-10-CM | POA: Diagnosis not present

## 2015-08-12 DIAGNOSIS — I421 Obstructive hypertrophic cardiomyopathy: Secondary | ICD-10-CM | POA: Diagnosis not present

## 2015-08-12 DIAGNOSIS — I4892 Unspecified atrial flutter: Secondary | ICD-10-CM

## 2015-08-12 DIAGNOSIS — Z720 Tobacco use: Secondary | ICD-10-CM

## 2015-08-12 DIAGNOSIS — I4891 Unspecified atrial fibrillation: Secondary | ICD-10-CM | POA: Diagnosis not present

## 2015-08-12 DIAGNOSIS — I480 Paroxysmal atrial fibrillation: Secondary | ICD-10-CM | POA: Diagnosis not present

## 2015-08-12 DIAGNOSIS — R0602 Shortness of breath: Secondary | ICD-10-CM | POA: Diagnosis not present

## 2015-08-12 HISTORY — PX: CARDIOVERSION: SHX1299

## 2015-08-12 LAB — COMPREHENSIVE METABOLIC PANEL
ALBUMIN: 3.6 g/dL (ref 3.5–5.0)
ALT: 24 U/L (ref 14–54)
ANION GAP: 8 (ref 5–15)
AST: 27 U/L (ref 15–41)
Alkaline Phosphatase: 103 U/L (ref 38–126)
BILIRUBIN TOTAL: 0.3 mg/dL (ref 0.3–1.2)
BUN: 19 mg/dL (ref 6–20)
CHLORIDE: 107 mmol/L (ref 101–111)
CO2: 24 mmol/L (ref 22–32)
Calcium: 9 mg/dL (ref 8.9–10.3)
Creatinine, Ser: 0.77 mg/dL (ref 0.44–1.00)
GFR calc Af Amer: >60 mL/min (ref >60)
Glucose, Bld: 102 mg/dL — ABNORMAL HIGH (ref 65–99)
POTASSIUM: 4.4 mmol/L (ref 3.5–5.1)
Sodium: 139 mmol/L (ref 135–145)
TOTAL PROTEIN: 7 g/dL (ref 6.5–8.1)

## 2015-08-12 LAB — CBC WITH DIFFERENTIAL/PLATELET
BASOS ABS: 0.1 10*3/uL (ref 0.0–0.1)
Basophils Relative: 0 %
EOS PCT: 1 %
Eosinophils Absolute: 0.2 10*3/uL (ref 0.0–0.7)
HEMATOCRIT: 42.4 % (ref 36.0–46.0)
HEMOGLOBIN: 14.1 g/dL (ref 12.0–15.0)
LYMPHS ABS: 5.1 10*3/uL — AB (ref 0.7–4.0)
LYMPHS PCT: 31 %
MCH: 29.4 pg (ref 26.0–34.0)
MCHC: 33.3 g/dL (ref 30.0–36.0)
MCV: 88.3 fL (ref 78.0–100.0)
Monocytes Absolute: 1.3 10*3/uL — ABNORMAL HIGH (ref 0.1–1.0)
Monocytes Relative: 8 %
NEUTROS ABS: 9.9 10*3/uL — AB (ref 1.7–7.7)
Neutrophils Relative %: 60 %
PLATELETS: 319 10*3/uL (ref 150–400)
RBC: 4.8 MIL/uL (ref 3.87–5.11)
RDW: 14.6 % (ref 11.5–15.5)
WBC: 16.5 10*3/uL — AB (ref 4.0–10.5)

## 2015-08-12 SURGERY — CARDIOVERSION
Anesthesia: Monitor Anesthesia Care

## 2015-08-12 MED ORDER — MIDAZOLAM HCL 2 MG/2ML IJ SOLN
INTRAMUSCULAR | Status: AC
  Filled 2015-08-12: qty 2

## 2015-08-12 MED ORDER — FENTANYL CITRATE (PF) 100 MCG/2ML IJ SOLN
INTRAMUSCULAR | Status: AC
  Filled 2015-08-12: qty 2

## 2015-08-12 MED ORDER — LEVALBUTEROL HCL 0.63 MG/3ML IN NEBU
0.6300 mg | INHALATION_SOLUTION | Freq: Three times a day (TID) | RESPIRATORY_TRACT | Status: DC
  Administered 2015-08-12 – 2015-08-13 (×2): 0.63 mg via RESPIRATORY_TRACT
  Filled 2015-08-12 (×2): qty 3

## 2015-08-12 MED ORDER — SODIUM CHLORIDE 0.9 % IV SOLN: 250.0000 mL | INTRAVENOUS | Status: DC

## 2015-08-12 MED ORDER — MIDAZOLAM HCL 2 MG/2ML IJ SOLN
1.0000 mg | INTRAMUSCULAR | Status: DC | PRN
  Administered 2015-08-12: 2 mg via INTRAVENOUS
  Filled 2015-08-12: qty 2

## 2015-08-12 MED ORDER — MIDAZOLAM HCL 5 MG/5ML IJ SOLN
INTRAMUSCULAR | Status: DC | PRN
  Administered 2015-08-12: 1 mg via INTRAVENOUS

## 2015-08-12 MED ORDER — FENTANYL CITRATE (PF) 100 MCG/2ML IJ SOLN: 25.0000 ug | INTRAMUSCULAR | Status: DC | PRN

## 2015-08-12 MED ORDER — IPRATROPIUM-ALBUTEROL 0.5-2.5 (3) MG/3ML IN SOLN: 3.0000 mL | Freq: Once | RESPIRATORY_TRACT | Status: DC

## 2015-08-12 MED ORDER — HYDROCORTISONE 1 % EX CREA
1.0000 | TOPICAL_CREAM | Freq: Three times a day (TID) | CUTANEOUS | Status: DC | PRN
Start: 2015-08-12 — End: 2015-08-13
  Filled 2015-08-12: qty 28

## 2015-08-12 MED ORDER — FENTANYL CITRATE (PF) 100 MCG/2ML IJ SOLN
INTRAMUSCULAR | Status: DC | PRN
  Administered 2015-08-12: 25 ug via INTRAVENOUS

## 2015-08-12 MED ORDER — PROPOFOL 500 MG/50ML IV EMUL
INTRAVENOUS | Status: DC | PRN
  Administered 2015-08-12: 100 ug/kg/min via INTRAVENOUS

## 2015-08-12 MED ORDER — SODIUM CHLORIDE 0.9% FLUSH: 3.0000 mL | INTRAVENOUS | Status: DC | PRN

## 2015-08-12 MED ORDER — ONDANSETRON HCL 4 MG/2ML IJ SOLN: 4.0000 mg | Freq: Once | INTRAMUSCULAR | Status: DC | PRN

## 2015-08-12 MED ORDER — LACTATED RINGERS IV SOLN
INTRAVENOUS | Status: DC
  Administered 2015-08-12: 11:00:00 via INTRAVENOUS

## 2015-08-12 MED ORDER — PREDNISONE 20 MG PO TABS
20.0000 mg | ORAL_TABLET | Freq: Every day | ORAL | Status: AC
Start: 2015-08-13 — End: 2015-08-13
  Administered 2015-08-13: 20 mg via ORAL
  Filled 2015-08-12: qty 1

## 2015-08-12 MED ORDER — PROPOFOL 10 MG/ML IV BOLUS
INTRAVENOUS | Status: AC
  Filled 2015-08-12: qty 20

## 2015-08-12 MED ORDER — SODIUM CHLORIDE 0.9% FLUSH
3.0000 mL | Freq: Two times a day (BID) | INTRAVENOUS | Status: DC
  Administered 2015-08-12: 3 mL via INTRAVENOUS

## 2015-08-12 NOTE — CV Procedure (Signed)
Direct current cardioversion  Indication: Persistent atrial flutter  Description of procedure: Patient seen and examined pre-sedation. Informed consent obtained. Lab work and medications reviewed as well as ECG. Patient was taken to the PACU. Anesthesia service present for initiation and management of IV sedation, please refer to their documentation for details. Time out was performed. Anterior and posterior pads were placed and connected to biphasic defibrillator. After adequate sedation was achieved, a single synchronized shock at 100 J was delivered with sandbag on anterior pad resulting in restoration of sinus rhythm after pause. Patient was hemodynamically stable and tolerated the procedure without immediate complications. Postprocedure ECG reviewed.  Satira Sark, M.D., F.A.C.C.

## 2015-08-12 NOTE — Interval H&P Note (Signed)
History and Physical Interval Note:  08/12/2015 11:22 AM  Kathleen Wright  has presented today for surgery, with the diagnosis of a flutter  The various methods of treatment have been discussed with the patient and family. After consideration of risks, benefits and other options for treatment, the patient has consented to  Procedure(s): CARDIOVERSION (N/A) as a surgical intervention .  The patient's history has been reviewed, patient examined, no change in status, stable for surgery.  I have reviewed the patient's chart and labs.  Questions were answered to the patient's satisfaction.     [name]

## 2015-08-12 NOTE — Anesthesia Preprocedure Evaluation (Signed)
Anesthesia Evaluation  Patient identified by MRN, date of birth, ID band Patient awake    Reviewed: Allergy & Precautions, NPO status , Patient's Chart, lab work & pertinent test results  Airway Mallampati: II  TM Distance: >3 FB Neck ROM: Full    Dental  (+) Edentulous Upper,    Pulmonary asthma , COPD,  COPD inhaler, Current Smoker,     + decreased breath sounds      Cardiovascular Exercise Tolerance: Poor hypertension, Pt. on medications + dysrhythmias Atrial Fibrillation  Rhythm:Irregular     Neuro/Psych    GI/Hepatic   Endo/Other    Renal/GU      Musculoskeletal  (+) Arthritis , Osteoarthritis,    Abdominal Normal abdominal exam  (+)   Peds  Hematology   Anesthesia Other Findings   Reproductive/Obstetrics                             Anesthesia Physical Anesthesia Plan  ASA: III  Anesthesia Plan: MAC   Post-op Pain Management:    Induction: Intravenous  Airway Management Planned: Nasal Cannula  Additional Equipment:   Intra-op Plan:   Post-operative Plan:   Informed Consent: I have reviewed the patients History and Physical, chart, labs and discussed the procedure including the risks, benefits and alternatives for the proposed anesthesia with the patient or authorized representative who has indicated his/her understanding and acceptance.     Plan Discussed with: CRNA  Anesthesia Plan Comments:         Anesthesia Quick Evaluation

## 2015-08-12 NOTE — H&P (View-Only) (Signed)
Requesting provider: Erline Hau, MD Primary cardiologist: Dr. Satira Sark Consulting cardiologist: Dr. Satira Sark  Reason for consultation: Recurrent atrial fibrillation/flutter  Clinical Summary Kathleen Wright is a 50 y.o.female seen by me for the first time in January of this year in hospital follow-up of atrial flutter with RVR in the setting of known hypertrophic cardiomyopathy status post previous septal myomectomy at [facility] back in 2006. She has a history of previously documented atrial fibrillation as well as intolerance to amiodarone. She spontaneously converted to sinus rhythm with rate control and has been managed as an outpatient on Cardizem CD 300 mg daily and Xarelto 20 mg daily. She has not required a recent cardioversion, but has history in the past.  She is now admitted to [facility] with sudden onset of palpitations on the afternoon of February 27 and subsequent documentation of atrial flutter. She has been on intravenous diltiazem with control of heart rate and also some intermittent atrial fibrillation, however heart rate has again increased following transition back to oral Cardizem CD.  She has a CHADSVASC score of 1 and has been on Xarelto since initiation at [facility] back and December 2016. Echocardiogram from December 2016 as outlined below and in summary showed LVEF 60-65% with evidence of previous septal myomectomy, no SAM or LVOT gradient, mild mitral regurgitation, moderate left atrial enlargement.  She was seen by Dr. [name] during her hospital stay at [facility] back in December 2016.   Allergies  Allergen Reactions  . Amiodarone Other (See Comments)    Liver function    Medications Scheduled Medications: . diltiazem  300 mg Oral Daily  . levalbuterol  0.63 mg Nebulization Q6H  . levofloxacin  500 mg Oral Daily  . pantoprazole  40 mg Oral Q0600  . predniSONE  40 mg Oral QAC breakfast  . rivaroxaban  20 mg Oral Q supper  .  sodium chloride flush  3 mL Intravenous Q12H    PRN Medications: ALPRAZolam, HYDROcodone-acetaminophen   Past Medical History  Diagnosis Date  . COPD (chronic obstructive pulmonary disease) ([place])   . Hypertrophic cardiomyopathy ([place])     a. s/p myomectomy at [facility] 08/2004  . Asthma   . Transient atrial fibrillation ([place]) 2006    a. after myomectomy at [facility] in 08/2004 - was on amiodarone but developed elevated LFTs felt possibly secondary to amiodarone   . Paroxysmal atrial flutter (HCC)     a. post-op after myomectomy in 2006. s/p TEE/DCCV.  Marland Kitchen Cervical cancer ([place])     a. treated with partial removal of cervix    Past Surgical History  Procedure Laterality Date  . Septal myomectomy  2006    [facility]  . Tubal ligation      Family History  Problem Relation Age of Onset  . Heart failure Father   . Diabetes    . Lung disease      Social History Kathleen Wright reports that she has been smoking Cigarettes.  She has been smoking about 1.00 pack per day. She does not have any smokeless tobacco history on file. Kathleen Wright reports that she does not drink alcohol.  Review of Systems Complete review of systems negative except as otherwise outlined in the clinical summary and also the following. No recent exertional chest pain, NYHA class II dyspnea. No reported bleeding problems.  Physical Examination Blood pressure 110/73, pulse 70, temperature 97.3 F (36.3 C), temperature source Oral, resp. rate 14, height 5\' 1"  (1.549  m), weight 203 lb 4.2 oz (92.2 kg), SpO2 99 %.  Intake/Output Summary (Last 24 hours) at 08/12/15 0816 Last data filed at 08/11/15 1721  Gross per 24 hour  Intake    720 ml  Output      0 ml  Net    720 ml   Telemetry: Atrial flutter with variable conduction.  Gen.: Patient in no distress. HEENT: Conjunctiva and lids normal, oropharynx clear. Neck: Supple, no elevated JVP or carotid bruits, no thyromegaly. Lungs: Clear to auscultation, nonlabored breathing at  rest. Cardiac: Irregular, no S3, soft systolic murmur, no pericardial rub. Abdomen: Soft, nontender, bowel sounds present, no guarding or rebound. Extremities: No pitting edema, distal pulses 2+. Skin: Warm and dry. Musculoskeletal: No kyphosis. Neuropsychiatric: Alert and oriented x3, affect grossly appropriate.  Lab Results  Basic Metabolic Panel:  Recent Labs Lab 08/10/15 1745  NA 137  K 3.7  CL 104  CO2 25  GLUCOSE 136*  BUN 19  CREATININE 1.18*  CALCIUM 9.1    CBC:  Recent Labs Lab 08/10/15 1745  WBC 14.7*  HGB 14.1  HCT 41.7  MCV 88.7  PLT 343    Cardiac Enzymes:  Recent Labs Lab 08/10/15 1745  TROPONINI 0.03    ECG I personally reviewed the tracing from 08/10/2015 which showed atrial flutter with 2:1 block, IVCD with LVH, ST segment abnormalities and likely reflect repolarization changes.  Imaging  Chest x-ray 08/10/2015: FINDINGS: Lungs are clear. No pleural effusion or pneumothorax.  Cardiomegaly.  Mild degenerative changes of the mid thoracic spine. Median sternotomy.  IMPRESSION: No evidence of acute cardiopulmonary disease.  Echocardiogram 06/10/2015: Study Conclusions  - Left ventricle: Post septal myectomy with scarring mid and apical septum moderately thickened No SAM or LVOT gradient. The cavity size was normal. Wall thickness was increased in a pattern of moderate LVH. Systolic function was normal. The estimated ejection fraction was in the range of 60% to 65%. - Mitral valve: Calcified annulus. There was mild regurgitation. - Left atrium: The atrium was moderately dilated. - Atrial septum: No defect or patent foramen ovale was identified.  Impression  1. Recurrent atrial flutter/fibrillation with rapid ventricular response. CHADSVASC score is 1 and she has been on Xarelto for stroke prophylaxis since December 2016. She has been on and off IV diltiazem with variable heart rate control, and rhythm persists. He  has moderate left atrial enlargement.  2. Hypertrophic cardiomyopathy status post septal myomectomy at [facility] in March 2006. Recent echocardiogram shows preserved LVEF with no SAM or LVOT gradient.  3. Previous history of atrial fibrillation and amiodarone intolerance. She required cardioversion for this in the past.  4. COPD.  Recommendations  Reviewed hospital course and discussed options with the patient. At this point our plan will be to pursue an elective DCCV to restore sinus rhythm at least in the short-term. Otherwise we will plan to continue Xarelto and Cardizem CD, with referral for her to see Dr. [name] as an outpatient as she needs to be considered for antiarrhythmic treatment versus ablation. Risks and benefits discussed, and she is in agreement to proceed. She is currently nothing by mouth.  Satira Sark, M.D., F.A.C.C.

## 2015-08-12 NOTE — Progress Notes (Signed)
Electrical Cardioversion Procedure Note Kathleen Wright [id number] Apr 29, 1966  Procedure: Electrical Cardioversion Indications: persistent atrial flutter Procedure Details Consent: yes Time Out: Verified patient identification, verified procedure, site/side was marked, verified correct patient position, special equipment/implants available, medications/allergies/relevent history reviewed, required imaging and test results available.  Time out 1134 Patient placed on cardiac monitor, pulse oximetry, supplemental oxygen as necessary.  Sedation given: yes Pacer pads placed yes  Cardioverted 1 time(s).  Cardioverted at 120 joules Evaluation Findings: Post procedure EKG shows: normal sinus rhythm Complications: none Patient did tolerate procedure well.   Hillery Jacks 08/12/2015, 11:58 AM

## 2015-08-12 NOTE — Transfer of Care (Signed)
Immediate Anesthesia Transfer of Care Note  Patient: Kathleen Wright  Procedure(s) Performed: Procedure(s): CARDIOVERSION (N/A)  Patient Location: PACU  Anesthesia Type:MAC  Level of Consciousness: awake and patient cooperative  Airway & Oxygen Therapy: Patient Spontanous Breathing and Patient connected to face mask oxygen  Post-op Assessment: Report given to RN, Post -op Vital signs reviewed and stable and Patient moving all extremities  Post vital signs: Reviewed and stable  Last Vitals:  Filed Vitals:   08/12/15 0825 08/12/15 1031  BP:  104/71  Pulse:  96  Temp: 36.6 C 36.6 C  Resp:  26    Complications: No apparent anesthesia complications

## 2015-08-12 NOTE — Progress Notes (Addendum)
[facility] PROGRESS NOTE  Kathleen Wright [id number] DOB: 1965-07-27 DOA: 08/10/2015 PCP: [name]., MD  66 ? Known atrial fibrillation + RVR [place] score 1 [on anticoagulation] Hypertrophic cardiomyopathy S/P myomectomy August 30, 2004, by Dr. [name] at [facility] echo EF 60-65% with left atrial dilatation 06/12/15 Intolerance to amiodarone (hepatotoxicity) and remote cardioversion Smoker since age 50 COPD Cervical cancer status post partial hysterectomy  Admitted to [facility] with atrial fibrillation with RVR on 08/10/15 At her request cardiology consult      Assessment/Plan: Atrial fibrillation with rapid ventricular response -Currently rate controlled. -Will start her home dose of oral Cardizem with plans to transition off IV Cardizem. -Continue Xarelto for anticoagulation. -Patient has a history of hypertensive cardiomyopathy treated at [facility] in 2006 with a septal myomectomy. She did develop postoperative atrial fibrillation which was treated with cardioversion at that time.  -Cardiology saw patient in consult 3/1 and patient underwent elective DC CV with restoration of sinus rhythm -keep on telemetry overnight and monitor--? May be DC home a.m. Rest as per cardiology   COPD with acute exacerbation -Currently no wheezings, lung sounds are clear. -Continue Levaquin for 5 days, start titrating prednisone.   Tobacco abuse -Counseled on cessation, does not appear interested in quitting at this time.  Code Status: Full code Family Communication: Patient only  Disposition Plan: Anticipate discharge home in 24 hours   Consultants:  None   Antibiotics:  Levaquin   Subjective:   Fair NO cp n/v/sob Tol diet No diarr  Objective: Filed Vitals:   08/12/15 0400 08/12/15 0500 08/12/15 0600 08/12/15 0700  BP: 93/56 86/56 93/62  110/73  Pulse: 67 67 70 70  Temp: 97.3 F (36.3 C)     TempSrc: Oral     Resp: 14 12 14 14   Height:      Weight:   92.2 kg (203 lb 4.2 oz)    SpO2: 98% 95% 96% 99%    Intake/Output Summary (Last 24 hours) at 08/12/15 0728 Last data filed at 08/11/15 1721  Gross per 24 hour  Intake 878.92 ml  Output      0 ml  Net 878.92 ml   Filed Weights   08/11/15 0009 08/11/15 0500 08/12/15 0500  Weight: 93 kg (205 lb 0.4 oz) 93 kg (205 lb 0.4 oz) 92.2 kg (203 lb 4.2 oz)    Exam:   General:  Alert, awake, oriented 3  Cardiovascular: Irregular rhythm, no murmurs, rubs or gallops  Respiratory: Clear to auscultation bilaterally  Abdomen: Soft, nontender, nondistended, positive bowel sounds  Data Reviewed: Basic Metabolic Panel:  Recent Labs Lab 08/10/15 1745  NA 137  K 3.7  CL 104  CO2 25  GLUCOSE 136*  BUN 19  CREATININE 1.18*  CALCIUM 9.1   Liver Function Tests: No results for input(s): AST, ALT, ALKPHOS, BILITOT, PROT, ALBUMIN in the last 168 hours. No results for input(s): LIPASE, AMYLASE in the last 168 hours. No results for input(s): AMMONIA in the last 168 hours. CBC:  Recent Labs Lab 08/10/15 1745  WBC 14.7*  HGB 14.1  HCT 41.7  MCV 88.7  PLT 343   Cardiac Enzymes:  Recent Labs Lab 08/10/15 1745  TROPONINI 0.03   BNP (last 3 results) No results for input(s): BNP in the last 8760 hours.  ProBNP (last 3 results) No results for input(s): PROBNP in the last 8760 hours.  CBG: No results for input(s): GLUCAP in the last 168 hours.  Recent Results (from the past  240 hour(s))  MRSA PCR Screening     Status: None   Collection Time: 08/11/15 12:10 AM  Result Value Ref Range Status   MRSA by PCR NEGATIVE NEGATIVE Final    Comment:        The GeneXpert MRSA Assay (FDA approved for NASAL specimens only), is one component of a comprehensive MRSA colonization surveillance program. It is not intended to diagnose MRSA infection nor to guide or monitor treatment for MRSA infections.      Studies: Dg Chest 2 View  08/10/2015  CLINICAL DATA:  Tachycardia, chest  pressure, asthma EXAM: CHEST  2 VIEW COMPARISON:  06/09/2015 FINDINGS: Lungs are clear.  No pleural effusion or pneumothorax. Cardiomegaly. Mild degenerative changes of the mid thoracic spine. Median sternotomy. IMPRESSION: No evidence of acute cardiopulmonary disease. Electronically Signed   By: Julian Hy M.D.   On: 08/10/2015 18:09    Scheduled Meds: . diltiazem  300 mg Oral Daily  . levalbuterol  0.63 mg Nebulization Q6H  . levofloxacin  500 mg Oral Daily  . pantoprazole  40 mg Oral Q0600  . predniSONE  40 mg Oral QAC breakfast  . rivaroxaban  20 mg Oral Q supper  . sodium chloride flush  3 mL Intravenous Q12H   Continuous Infusions:   Principal Problem:   Atrial flutter with rapid ventricular response (HCC) Active Problems:   Hypertrophic obstructive cardiomyopathy (HCC)   History of atrial fibrillation   COPD (chronic obstructive pulmonary disease) (HCC)   Tobacco abuse   COPD exacerbation (HCC)   Paroxysmal a-fib (HCC)    Time spent: 25 minutes. Greater than 50% of this time was spent in direct contact with the patient coordinating care.  Verneita Griffes, MD [facility] [phone]

## 2015-08-12 NOTE — Anesthesia Postprocedure Evaluation (Signed)
Anesthesia Post Note  Patient: Kathleen Wright  Procedure(s) Performed: Procedure(s) (LRB): CARDIOVERSION (N/A)  Patient location during evaluation: PACU Anesthesia Type: MAC Level of consciousness: awake and alert and patient cooperative Pain management: pain level controlled Vital Signs Assessment: post-procedure vital signs reviewed and stable Respiratory status: patient connected to face mask oxygen Cardiovascular status: blood pressure returned to baseline and stable Postop Assessment: no signs of nausea or vomiting Anesthetic complications: no    Last Vitals:  Filed Vitals:   08/12/15 0825 08/12/15 1031  BP:  104/71  Pulse:  96  Temp: 36.6 C 36.6 C  Resp:  26    Last Pain:  Filed Vitals:   08/12/15 1033  PainSc: 0-No pain                 Georgeana Oertel J

## 2015-08-12 NOTE — Consult Note (Signed)
Requesting provider: Erline Hau, MD Primary cardiologist: Dr. Satira Sark Consulting cardiologist: Dr. Satira Sark  Reason for consultation: Recurrent atrial fibrillation/flutter  Clinical Summary Ms. Assenmacher is a 50 y.o.female seen by me for the first time in January of this year in hospital follow-up of atrial flutter with RVR in the setting of known hypertrophic cardiomyopathy status post previous septal myomectomy at [facility] back in 2006. She has a history of previously documented atrial fibrillation as well as intolerance to amiodarone. She spontaneously converted to sinus rhythm with rate control and has been managed as an outpatient on Cardizem CD 300 mg daily and Xarelto 20 mg daily. She has not required a recent cardioversion, but has history in the past.  She is now admitted to [facility] with sudden onset of palpitations on the afternoon of February 27 and subsequent documentation of atrial flutter. She has been on intravenous diltiazem with control of heart rate and also some intermittent atrial fibrillation, however heart rate has again increased following transition back to oral Cardizem CD.  She has a CHADSVASC score of 1 and has been on Xarelto since initiation at [facility] back and December 2016. Echocardiogram from December 2016 as outlined below and in summary showed LVEF 60-65% with evidence of previous septal myomectomy, no SAM or LVOT gradient, mild mitral regurgitation, moderate left atrial enlargement.  She was seen by Dr. [name] during her hospital stay at [facility] back in December 2016.   Allergies  Allergen Reactions  . Amiodarone Other (See Comments)    Liver function    Medications Scheduled Medications: . diltiazem  300 mg Oral Daily  . levalbuterol  0.63 mg Nebulization Q6H  . levofloxacin  500 mg Oral Daily  . pantoprazole  40 mg Oral Q0600  . predniSONE  40 mg Oral QAC breakfast  . rivaroxaban  20 mg Oral Q supper  .  sodium chloride flush  3 mL Intravenous Q12H    PRN Medications: ALPRAZolam, HYDROcodone-acetaminophen   Past Medical History  Diagnosis Date  . COPD (chronic obstructive pulmonary disease) ([place])   . Hypertrophic cardiomyopathy ([place])     a. s/p myomectomy at [facility] 08/2004  . Asthma   . Transient atrial fibrillation ([place]) 2006    a. after myomectomy at [facility] in 08/2004 - was on amiodarone but developed elevated LFTs felt possibly secondary to amiodarone   . Paroxysmal atrial flutter (HCC)     a. post-op after myomectomy in 2006. s/p TEE/DCCV.  Marland Kitchen Cervical cancer ([place])     a. treated with partial removal of cervix    Past Surgical History  Procedure Laterality Date  . Septal myomectomy  2006    [facility]  . Tubal ligation      Family History  Problem Relation Age of Onset  . Heart failure Father   . Diabetes    . Lung disease      Social History Ms. Wlodarczyk reports that she has been smoking Cigarettes.  She has been smoking about 1.00 pack per day. She does not have any smokeless tobacco history on file. Ms. Waligorski reports that she does not drink alcohol.  Review of Systems Complete review of systems negative except as otherwise outlined in the clinical summary and also the following. No recent exertional chest pain, NYHA class II dyspnea. No reported bleeding problems.  Physical Examination Blood pressure 110/73, pulse 70, temperature 97.3 F (36.3 C), temperature source Oral, resp. rate 14, height 5\' 1"  (1.549  m), weight 203 lb 4.2 oz (92.2 kg), SpO2 99 %.  Intake/Output Summary (Last 24 hours) at 08/12/15 0816 Last data filed at 08/11/15 1721  Gross per 24 hour  Intake    720 ml  Output      0 ml  Net    720 ml   Telemetry: Atrial flutter with variable conduction.  Gen.: Patient in no distress. HEENT: Conjunctiva and lids normal, oropharynx clear. Neck: Supple, no elevated JVP or carotid bruits, no thyromegaly. Lungs: Clear to auscultation, nonlabored breathing at  rest. Cardiac: Irregular, no S3, soft systolic murmur, no pericardial rub. Abdomen: Soft, nontender, bowel sounds present, no guarding or rebound. Extremities: No pitting edema, distal pulses 2+. Skin: Warm and dry. Musculoskeletal: No kyphosis. Neuropsychiatric: Alert and oriented x3, affect grossly appropriate.  Lab Results  Basic Metabolic Panel:  Recent Labs Lab 08/10/15 1745  NA 137  K 3.7  CL 104  CO2 25  GLUCOSE 136*  BUN 19  CREATININE 1.18*  CALCIUM 9.1    CBC:  Recent Labs Lab 08/10/15 1745  WBC 14.7*  HGB 14.1  HCT 41.7  MCV 88.7  PLT 343    Cardiac Enzymes:  Recent Labs Lab 08/10/15 1745  TROPONINI 0.03    ECG I personally reviewed the tracing from 08/10/2015 which showed atrial flutter with 2:1 block, IVCD with LVH, ST segment abnormalities and likely reflect repolarization changes.  Imaging  Chest x-ray 08/10/2015: FINDINGS: Lungs are clear. No pleural effusion or pneumothorax.  Cardiomegaly.  Mild degenerative changes of the mid thoracic spine. Median sternotomy.  IMPRESSION: No evidence of acute cardiopulmonary disease.  Echocardiogram 06/10/2015: Study Conclusions  - Left ventricle: Post septal myectomy with scarring mid and apical septum moderately thickened No SAM or LVOT gradient. The cavity size was normal. Wall thickness was increased in a pattern of moderate LVH. Systolic function was normal. The estimated ejection fraction was in the range of 60% to 65%. - Mitral valve: Calcified annulus. There was mild regurgitation. - Left atrium: The atrium was moderately dilated. - Atrial septum: No defect or patent foramen ovale was identified.  Impression  1. Recurrent atrial flutter/fibrillation with rapid ventricular response. CHADSVASC score is 1 and she has been on Xarelto for stroke prophylaxis since December 2016. She has been on and off IV diltiazem with variable heart rate control, and rhythm persists. He  has moderate left atrial enlargement.  2. Hypertrophic cardiomyopathy status post septal myomectomy at [facility] in March 2006. Recent echocardiogram shows preserved LVEF with no SAM or LVOT gradient.  3. Previous history of atrial fibrillation and amiodarone intolerance. She required cardioversion for this in the past.  4. COPD.  Recommendations  Reviewed hospital course and discussed options with the patient. At this point our plan will be to pursue an elective DCCV to restore sinus rhythm at least in the short-term. Otherwise we will plan to continue Xarelto and Cardizem CD, with referral for her to see Dr. [name] as an outpatient as she needs to be considered for antiarrhythmic treatment versus ablation. Risks and benefits discussed, and she is in agreement to proceed. She is currently nothing by mouth.  Satira Sark, M.D., F.A.C.C.

## 2015-08-13 DIAGNOSIS — I4892 Unspecified atrial flutter: Secondary | ICD-10-CM | POA: Diagnosis not present

## 2015-08-13 DIAGNOSIS — J449 Chronic obstructive pulmonary disease, unspecified: Secondary | ICD-10-CM | POA: Diagnosis not present

## 2015-08-13 DIAGNOSIS — I421 Obstructive hypertrophic cardiomyopathy: Secondary | ICD-10-CM | POA: Diagnosis not present

## 2015-08-13 MED ORDER — LEVALBUTEROL HCL 0.63 MG/3ML IN NEBU: 0.6300 mg | INHALATION_SOLUTION | Freq: Two times a day (BID) | RESPIRATORY_TRACT | Status: DC

## 2015-08-13 MED ORDER — HYDROCORTISONE 1 % EX CREA: 1.0000 "application " | TOPICAL_CREAM | Freq: Three times a day (TID) | CUTANEOUS | Status: DC | PRN

## 2015-08-13 MED ORDER — PANTOPRAZOLE SODIUM 40 MG PO TBEC: 40.0000 mg | DELAYED_RELEASE_TABLET | Freq: Every day | ORAL | Status: DC

## 2015-08-13 NOTE — Discharge Summary (Signed)
Physician Discharge Summary  Kathleen Wright [id number] DOB: 1965-11-14 DOA: 08/10/2015  PCP: [name]., MD  Admit date: 08/10/2015 Discharge date: 08/13/2015  Time spent: 35 minutes  Recommendations for Outpatient Follow-up:  1. Will need EP follow-up as OP to be arranged as per Cardiology 2. Please continue smoking cessation efforts as an OP-patch given   Discharge Diagnoses:  Principal Problem:   Atrial flutter with rapid ventricular response (HCC) Active Problems:   Hypertrophic obstructive cardiomyopathy (HCC)   History of atrial fibrillation   COPD (chronic obstructive pulmonary disease) (HCC)   Tobacco abuse   COPD exacerbation (HCC)   Paroxysmal a-fib (HCC)   Discharge Condition: fair  Diet recommendation: heart healthy low salt  Filed Weights   08/12/15 0500 08/12/15 1031 08/13/15 0500  Weight: 92.2 kg (203 lb 4.2 oz) 92.08 kg (203 lb) 93.6 kg (206 lb 5.6 oz)    History of present illness:  49 ? Known atrial fibrillation + RVR [place] score 1 [on anticoagulation] Hypertrophic cardiomyopathy S/P myomectomy August 30, 2004, by Dr. [name] at [facility] echo EF 60-65% with left atrial dilatation 06/12/15 Intolerance to amiodarone (hepatotoxicity) and remote cardioversion Smoker since age 35 COPD Cervical cancer status post partial hysterectomy  Admitted to [facility] with atrial fibrillation with RVR on 08/10/15 At her request cardiology consulted  initially placed on Cardizem Gtt-then changed back to home dosing of Cardizem 300 CD  Had elective DCCV per Dr. [name]. 100 J and Sinus rythmn was restored  Because of her continued issues, It was recommended she follow with EP as an OP  Patient was offered nicotine patch on d/c  It was also recommended to cut down on smoking in addition to cutting down on caffeine soda.  Albuterol also could have been a precipitant in this case    Discharge Exam: Filed Vitals:   08/13/15 0600 08/13/15 0825  BP:  104/81   Pulse: 64   Temp:  96.5 F (35.8 C)  Resp: 14     General: eomi ncat  Cardiovascular: s1 s2 no m/r/g Respiratory: clerar no wheeze  Discharge Instructions   Discharge Instructions    Diet - low sodium heart healthy    Complete by:  As directed      Discharge instructions    Complete by:  As directed   Patient will benefit from follow-up with electrophysiology as per cardiology instructions. You'll be called with an appointment by those physicians to determine the best time to get this set up Recommend that you get lab work done in about one week at your regular physician's office Would discontinue taking ibuprofen given the fact that you are on a blood thinner You completed     Increase activity slowly    Complete by:  As directed           Current Discharge Medication List    START taking these medications   Details  hydrocortisone cream 1 % Apply 1 application topically 3 (three) times daily as needed for itching (skin irritation). Qty: 30 g, Refills: 0    pantoprazole (PROTONIX) 40 MG tablet Take 1 tablet (40 mg total) by mouth daily at 6 (six) AM. Qty: 30 tablet, Refills: 0      CONTINUE these medications which have NOT CHANGED   Details  ALPRAZolam (XANAX) 1 MG tablet Take 1 mg by mouth 3 (three) times daily as needed for anxiety.     diltiazem (CARDIZEM CD) 300 MG 24 hr capsule Take 1 capsule (300 mg  total) by mouth daily. Qty: 30 capsule, Refills: 0    HYDROcodone-acetaminophen (NORCO) 10-325 MG tablet Take 1 tablet by mouth every 6 (six) hours as needed for moderate pain or severe pain.     PROAIR HFA 108 (90 Base) MCG/ACT inhaler Inhale 1-2 puffs into the lungs every 6 (six) hours as needed for wheezing or shortness of breath.     rivaroxaban (XARELTO) 20 MG TABS tablet Take 1 tablet (20 mg total) by mouth daily with supper. Qty: 30 tablet, Refills: 0      STOP taking these medications     ibuprofen (ADVIL,MOTRIN) 800 MG tablet         Allergies  Allergen Reactions  . Amiodarone Other (See Comments)    Liver function      The results of significant diagnostics from this hospitalization (including imaging, microbiology, ancillary and laboratory) are listed below for reference.    Significant Diagnostic Studies: Dg Chest 2 View  08/10/2015  CLINICAL DATA:  Tachycardia, chest pressure, asthma EXAM: CHEST  2 VIEW COMPARISON:  06/09/2015 FINDINGS: Lungs are clear.  No pleural effusion or pneumothorax. Cardiomegaly. Mild degenerative changes of the mid thoracic spine. Median sternotomy. IMPRESSION: No evidence of acute cardiopulmonary disease. Electronically Signed   By: Julian Hy M.D.   On: 08/10/2015 18:09   Mm Digital Screening Bilateral  07/24/2015  CLINICAL DATA:  Screening. EXAM: DIGITAL SCREENING BILATERAL MAMMOGRAM WITH CAD COMPARISON:  Previous exam(s). ACR Breast Density Category b: There are scattered areas of fibroglandular density. FINDINGS: There are no findings suspicious for malignancy. Images were processed with CAD. IMPRESSION: No mammographic evidence of malignancy. A result letter of this screening mammogram will be mailed directly to the patient. RECOMMENDATION: Screening mammogram in one year. (Code:SM-B-01Y) BI-RADS CATEGORY  1: Negative. Electronically Signed   By: Everlean Alstrom M.D.   On: 07/24/2015 09:01    Microbiology: Recent Results (from the past 240 hour(s))  MRSA PCR Screening     Status: None   Collection Time: 08/11/15 12:10 AM  Result Value Ref Range Status   MRSA by PCR NEGATIVE NEGATIVE Final    Comment:        The GeneXpert MRSA Assay (FDA approved for NASAL specimens only), is one component of a comprehensive MRSA colonization surveillance program. It is not intended to diagnose MRSA infection nor to guide or monitor treatment for MRSA infections.      Labs: Basic Metabolic Panel:  Recent Labs Lab 08/10/15 1745 08/12/15 0825  NA 137 139  K 3.7 4.4  CL 104  107  CO2 25 24  GLUCOSE 136* 102*  BUN 19 19  CREATININE 1.18* 0.77  CALCIUM 9.1 9.0   Liver Function Tests:  Recent Labs Lab 08/12/15 0825  AST 27  ALT 24  ALKPHOS 103  BILITOT 0.3  PROT 7.0  ALBUMIN 3.6   No results for input(s): LIPASE, AMYLASE in the last 168 hours. No results for input(s): AMMONIA in the last 168 hours. CBC:  Recent Labs Lab 08/10/15 1745 08/12/15 0825  WBC 14.7* 16.5*  NEUTROABS  --  9.9*  HGB 14.1 14.1  HCT 41.7 42.4  MCV 88.7 88.3  PLT 343 319   Cardiac Enzymes:  Recent Labs Lab 08/10/15 1745  TROPONINI 0.03   BNP: BNP (last 3 results) No results for input(s): BNP in the last 8760 hours.  ProBNP (last 3 results) No results for input(s): PROBNP in the last 8760 hours.  CBG: No results for input(s): GLUCAP in the  last 168 hours.     SignedNita Sells MD   [facility] 08/13/2015, 9:25 AM

## 2015-08-13 NOTE — Anesthesia Postprocedure Evaluation (Signed)
Anesthesia Post Note  Patient: Kathleen Wright  Procedure(s) Performed: Procedure(s) (LRB): CARDIOVERSION (N/A)  Patient location during evaluation: ICU Anesthesia Type: MAC Level of consciousness: awake and alert, oriented and patient cooperative Pain management: pain level controlled Vital Signs Assessment: post-procedure vital signs reviewed and stable Respiratory status: spontaneous breathing and patient connected to nasal cannula oxygen Cardiovascular status: blood pressure returned to baseline Postop Assessment: no headache Anesthetic complications: no    Last Vitals:  Filed Vitals:   08/13/15 0500 08/13/15 0600  BP: 108/87 104/81  Pulse: 36 64  Temp:    Resp: 13 14    Last Pain:  Filed Vitals:   08/13/15 0800  PainSc: 0-No pain                 Giordano Getman J

## 2015-08-13 NOTE — Progress Notes (Signed)
Patient was given discharge orders, IV removed, no blood, pt dress herself, she was wheeled out hospital for discharge to home by husband.

## 2015-08-13 NOTE — Care Management Note (Signed)
Case Management Note  Patient Details  Name: LAMYIA MECCIA MRN: [id number] Date of Birth: 1966/04/10   Expected Discharge Date:     08/13/2015             Expected Discharge Plan:  Home/Self Care  In-House Referral:  NA  Discharge planning Services  CM Consult  Post Acute Care Choice:  NA Choice offered to:  NA  DME Arranged:    DME Agency:     HH Arranged:    [place] Agency:     Status of Service:  Completed, signed off  Medicare Important Message Given:    Date Medicare IM Given:    Medicare IM give by:    Date Additional Medicare IM Given:    Additional Medicare Important Message give by:     If discussed at [place] of Stay Meetings, dates discussed:    Additional Comments: Pt discharged home today with self care. No CM needs.   Sherald Barge, RN 08/13/2015, 1:59 PM

## 2015-08-13 NOTE — Progress Notes (Signed)
Primary cardiologist: Dr. Satira Sark  Seen for followup: Atrial flutter status post cardioversion  Subjective:    No specific complaints this morning. No further palpitations, no chest pain or breathlessness. Ate breakfast.  Objective:   Temp:  [96.6 F (35.9 C)-97.9 F (36.6 C)] 96.6 F (35.9 C) (03/02 0400) Pulse Rate:  [36-96] 64 (03/02 0600) Resp:  [12-26] 14 (03/02 0600) BP: (74-112)/(41-87) 104/81 mmHg (03/02 0600) SpO2:  [94 %-100 %] 98 % (03/02 0600) Weight:  [203 lb (92.08 kg)-206 lb 5.6 oz (93.6 kg)] 206 lb 5.6 oz (93.6 kg) (03/02 0500) Last BM Date: 08/09/15  Filed Weights   08/12/15 0500 08/12/15 1031 08/13/15 0500  Weight: 203 lb 4.2 oz (92.2 kg) 203 lb (92.08 kg) 206 lb 5.6 oz (93.6 kg)    Intake/Output Summary (Last 24 hours) at 08/13/15 0756 Last data filed at 08/12/15 1125  Gross per 24 hour  Intake    200 ml  Output      0 ml  Net    200 ml    Telemetry: Sinus rhythm with occasional PACs, transient bradycardia.  Exam:  General: Patient appears comfortable at rest.  Lungs: Clear, nonlabored.  Cardiac: RRR with soft systolic murmur.  Abdomen: NABS.  Extremities: No pitting edema.  Lab Results:  Basic Metabolic Panel:  Recent Labs Lab 08/10/15 1745 08/12/15 0825  NA 137 139  K 3.7 4.4  CL 104 107  CO2 25 24  GLUCOSE 136* 102*  BUN 19 19  CREATININE 1.18* 0.77  CALCIUM 9.1 9.0    Liver Function Tests:  Recent Labs Lab 08/12/15 0825  AST 27  ALT 24  ALKPHOS 103  BILITOT 0.3  PROT 7.0  ALBUMIN 3.6    CBC:  Recent Labs Lab 08/10/15 1745 08/12/15 0825  WBC 14.7* 16.5*  HGB 14.1 14.1  HCT 41.7 42.4  MCV 88.7 88.3  PLT 343 319    Cardiac Enzymes:  Recent Labs Lab 08/10/15 1745  TROPONINI 0.03    ECG: I personally reviewed the tracing from 08/12/2015 which showed sinus rhythm with LAE and IVCD, repolarization abnormalities.  Echocardiogram 06/10/2015: Study Conclusions  - Left ventricle: Post  septal myectomy with scarring mid and apical septum moderately thickened No SAM or LVOT gradient. The cavity size was normal. Wall thickness was increased in a pattern of moderate LVH. Systolic function was normal. The estimated ejection fraction was in the range of 60% to 65%. - Mitral valve: Calcified annulus. There was mild regurgitation. - Left atrium: The atrium was moderately dilated. - Atrial septum: No defect or patent foramen ovale was identified.   Medications:   Scheduled Medications: . diltiazem  300 mg Oral Daily  . levalbuterol  0.63 mg Nebulization TID  . levofloxacin  500 mg Oral Daily  . pantoprazole  40 mg Oral Q0600  . rivaroxaban  20 mg Oral Q supper  . sodium chloride flush  3 mL Intravenous Q12H  . sodium chloride flush  3 mL Intravenous Q12H     Infusions: . sodium chloride       PRN Medications:  ALPRAZolam, HYDROcodone-acetaminophen, hydrocortisone cream, sodium chloride flush   Assessment:   1. Recurrent atrial flutter/fibrillation with rapid ventricular response. CHADSVASC score is 1 and she is on Xarelto for stroke prophylaxis. Status post cardioversion on March 1 with successful restoration of sinus rhythm. She continues on Cardizem CD 300 mg daily.  2. Hypertrophic cardiomyopathy status post septal myomectomy at [facility] in March 2006. Recent echocardiogram  shows preserved LVEF with no SAM or LVOT gradient.  3. Previous history of atrial fibrillation and amiodarone intolerance. She required cardioversion for this in the past.  4. COPD.   Plan/Discussion:    Anticipate discharge home today on current cardiac regimen. I am arranging an office visit with Dr. [name] (he saw her during prior evaluation at [facility]) for EP evaluation and discussion regarding possibility of antiarrhythmic therapy versus ablation given recurring arrhythmia over the last few months.  Satira Sark, M.D., F.A.C.C.

## 2015-08-13 NOTE — Addendum Note (Signed)
Addendum  created 08/13/15 [id number] by Charmaine Downs, CRNA   Modules edited: Clinical Notes   Clinical Notes:  File: [id number]

## 2015-08-14 ENCOUNTER — Encounter (HOSPITAL_COMMUNITY): Payer: Self-pay | Admitting: Cardiology

## 2015-08-21 DIAGNOSIS — Z1389 Encounter for screening for other disorder: Secondary | ICD-10-CM | POA: Diagnosis not present

## 2015-08-21 DIAGNOSIS — J449 Chronic obstructive pulmonary disease, unspecified: Secondary | ICD-10-CM | POA: Diagnosis not present

## 2015-08-21 DIAGNOSIS — I4891 Unspecified atrial fibrillation: Secondary | ICD-10-CM | POA: Diagnosis not present

## 2015-08-27 ENCOUNTER — Encounter: Payer: Self-pay | Admitting: Physician Assistant

## 2015-08-27 ENCOUNTER — Ambulatory Visit (INDEPENDENT_AMBULATORY_CARE_PROVIDER_SITE_OTHER): Payer: Medicare Other | Admitting: Physician Assistant

## 2015-08-27 VITALS — BP 122/70 | HR 72 | Ht 61.0 in | Wt 207.0 lb

## 2015-08-27 DIAGNOSIS — I4892 Unspecified atrial flutter: Secondary | ICD-10-CM | POA: Diagnosis not present

## 2015-08-27 DIAGNOSIS — I480 Paroxysmal atrial fibrillation: Secondary | ICD-10-CM

## 2015-08-27 DIAGNOSIS — R0989 Other specified symptoms and signs involving the circulatory and respiratory systems: Secondary | ICD-10-CM | POA: Diagnosis not present

## 2015-08-27 DIAGNOSIS — Z720 Tobacco use: Secondary | ICD-10-CM | POA: Diagnosis not present

## 2015-08-27 NOTE — Progress Notes (Signed)
Cardiology Office Note   Date:  08/27/2015   ID:  Kathleen Wright, DOB 06/05/1966, MRN [id number]  PCP:  [name]., MD  Cardiologist:  Dr Joie Bimler, PA-C   Chief Complaint  Patient presents with  . Hospitalization Follow-up    History of Present Illness: Kathleen Wright is a 50 y.o. female with a history of paroxysmal atrial fib/atrial flutter on Xarelto (amio d/c due to elevated LFTs), COPD, HOCM s/p myomectomy, EF 60-65% by echo 05/2015.  D/c 02/27 after admission for atrial flutter, RVR, s/p DCCV. No change in Cardizem dose.   Kathleen Wright presents for post-hospital followup.  Since d/c from the hospital, she has had no palpitations, no chest pain. Her SOB is at baseline. No new respiratory problems. She is very sure she has had no atrial fib since d/c. She feels the arrhythmia in her ears.   This was the first episode of afib since her heart surgery 11 years ago.  She has only occasional daytime LE edema. No problems with the Xarelto. Never gets headaches, blurred vision.  Past Medical History  Diagnosis Date  . COPD (chronic obstructive pulmonary disease) ([place])   . Hypertrophic cardiomyopathy ([place])     a. s/p myomectomy at [facility] 08/2004  . Asthma   . Transient atrial fibrillation ([place]) 2006    a. after myomectomy at [facility] in 08/2004 - was on amiodarone but developed elevated LFTs felt possibly secondary to amiodarone   . Paroxysmal atrial flutter (HCC)     a. post-op after myomectomy in 2006. s/p TEE/DCCV.  Marland Kitchen Cervical cancer ([place])     a. treated with partial removal of cervix    Past Surgical History  Procedure Laterality Date  . Septal myomectomy  2006    [facility]  . Tubal ligation    . Cardioversion N/A 08/12/2015    Procedure: CARDIOVERSION;  Surgeon: Satira Sark, MD;  Location[facility];  Service: Cardiovascular;  Laterality: N/A;    Current Outpatient Prescriptions  Medication Sig Dispense Refill  . ALPRAZolam (XANAX) 1 MG  tablet Take 1 mg by mouth 3 (three) times daily as needed for anxiety.     Marland Kitchen diltiazem (CARDIZEM CD) 300 MG 24 hr capsule Take 1 capsule (300 mg total) by mouth daily. 30 capsule 0  . HYDROcodone-acetaminophen (NORCO) 10-325 MG tablet Take 1 tablet by mouth every 6 (six) hours as needed for moderate pain or severe pain.     . hydrocortisone cream 1 % Apply 1 application topically 3 (three) times daily as needed for itching (skin irritation). 30 g 0  . levalbuterol (XOPENEX) 0.63 MG/3ML nebulizer solution     . pantoprazole (PROTONIX) 40 MG tablet Take 1 tablet (40 mg total) by mouth daily at 6 (six) AM. 30 tablet 0  . PROAIR HFA 108 (90 Base) MCG/ACT inhaler Inhale 1-2 puffs into the lungs every 6 (six) hours as needed for wheezing or shortness of breath.     . rivaroxaban (XARELTO) 20 MG TABS tablet Take 1 tablet (20 mg total) by mouth daily with supper. 30 tablet 0   No current facility-administered medications for this visit.    Allergies:   Amiodarone    Social History:  The patient  reports that she has been smoking Cigarettes.  She has been smoking about 1.00 pack per day. She does not have any smokeless tobacco history on file. She reports that she does not drink alcohol or use illicit drugs.  Family History:  The patient's family history includes Heart failure in her father.    ROS:  Please see the history of present illness. All other systems are reviewed and negative.    PHYSICAL EXAM: VS:  BP 122/70 mmHg  Pulse 72  Ht 5\' 1"  (1.549 m)  Wt 207 lb (93.895 kg)  BMI 39.13 kg/m2  SpO2 95% , BMI Body mass index is 39.13 kg/(m^2). GEN: Well nourished, well developed, female in no acute distress HEENT: normal for age  Neck: no JVD, Left carotid bruit, no masses Cardiac: RRR; soft murmur, no rubs, or gallops Respiratory: No wheeze, few dry rales bilaterally, normal work of breathing GI: soft, nontender, nondistended, + BS Kathleen: no deformity or atrophy; no edema; distal pulses are  2+ in all 4 extremities  Skin: warm and dry, no rash Neuro:  Strength and sensation are intact Psych: euthymic mood, full affect   EKG:  EKG is not ordered today  Recent Labs: 08/10/2015: TSH 1.570 08/12/2015: ALT 24; BUN 19; Creatinine, Ser 0.77; Hemoglobin 14.1; Platelets 319; Potassium 4.4; Sodium 139    Lipid Panel No results found for: CHOL, TRIG, HDL, CHOLHDL, VLDL, LDLCALC, LDLDIRECT   Wt Readings from Last 3 Encounters:  08/27/15 207 lb (93.895 kg)  08/13/15 206 lb 5.6 oz (93.6 kg)  07/01/15 199 lb 6.4 oz (90.447 kg)     Other studies Reviewed: Additional studies/ records that were reviewed today include: Hospital records and other data.  ASSESSMENT AND PLAN:  1.  Paroxysmal atrial flutter and atrial fib: Kathleen Wright cancelled the appointment with Dr [name], saying she does not feel the need to talk about additional meds or ablation at this time.  Since she has not had any afib/flutter in 11 years, she wants to wait and see. If the arrhythmia recurs more frequently, she will consider other meds or treatments.  2. Tobacco use: strongly encouraged to quit as her COPD will only get worse  3. COPD: discussed how COPD and meds for it make it virtually guaranteed that she will have more afib/flutter.   4. Left carotid bruit: No one had mentioned it before. Discussed what it might mean. Recommended an [place] to evaluate, but she wants to defer this. If she has any symptoms such as dizziness, weakness or vision problems, she will call.  Current medicines are reviewed at length with the patient today.  The patient does not have concerns regarding medicines.  The following changes have been made:  no change  Labs/ tests ordered today include:  No orders of the defined types were placed in this encounter.     Disposition:   FU with Dr [name]  Signed, Rosaria Ferries, PA-C  08/27/2015 2:30 PM    [place] Phone: [phone]; Fax: [phone]    This note was written with the assistance of speech recognition software. Please excuse any transcriptional errors.

## 2015-08-27 NOTE — Patient Instructions (Signed)
Medication Instructions:  Your physician recommends that you continue on your current medications as directed. Please refer to the Current Medication list given to you today.   Labwork: none  Testing/Procedures: none  Follow-Up: Your physician recommends that you schedule a follow-up appointment in: 3 months with Dr. [name]    Any Other Special Instructions Will Be Listed Below (If Applicable).   Call our office sooner if you have any more Atrial fibrillation   If you need a refill on your cardiac medications before your next appointment, please call your pharmacy.

## 2015-09-07 ENCOUNTER — Ambulatory Visit: Payer: Medicare Other | Admitting: Internal Medicine

## 2015-10-06 ENCOUNTER — Encounter: Payer: Self-pay | Admitting: Cardiology

## 2015-10-26 ENCOUNTER — Emergency Department (HOSPITAL_COMMUNITY)
Admission: EM | Admit: 2015-10-26 | Discharge: 2015-10-26 | Disposition: A | Discharge status: Home / Self Care | Payer: Medicare Other | Attending: Emergency Medicine | Admitting: Emergency Medicine

## 2015-10-26 ENCOUNTER — Encounter (HOSPITAL_COMMUNITY): Payer: Self-pay | Admitting: *Deleted

## 2015-10-26 DIAGNOSIS — Z8541 Personal history of malignant neoplasm of cervix uteri: Secondary | ICD-10-CM | POA: Diagnosis not present

## 2015-10-26 DIAGNOSIS — R0789 Other chest pain: Secondary | ICD-10-CM | POA: Diagnosis not present

## 2015-10-26 DIAGNOSIS — R000 Tachycardia, unspecified: Secondary | ICD-10-CM | POA: Diagnosis present

## 2015-10-26 DIAGNOSIS — R778 Other specified abnormalities of plasma proteins: Secondary | ICD-10-CM

## 2015-10-26 DIAGNOSIS — J45909 Unspecified asthma, uncomplicated: Secondary | ICD-10-CM | POA: Insufficient documentation

## 2015-10-26 DIAGNOSIS — F17210 Nicotine dependence, cigarettes, uncomplicated: Secondary | ICD-10-CM | POA: Diagnosis not present

## 2015-10-26 DIAGNOSIS — Z79899 Other long term (current) drug therapy: Secondary | ICD-10-CM | POA: Insufficient documentation

## 2015-10-26 DIAGNOSIS — R7989 Other specified abnormal findings of blood chemistry: Secondary | ICD-10-CM | POA: Diagnosis not present

## 2015-10-26 DIAGNOSIS — J449 Chronic obstructive pulmonary disease, unspecified: Secondary | ICD-10-CM | POA: Insufficient documentation

## 2015-10-26 DIAGNOSIS — I480 Paroxysmal atrial fibrillation: Secondary | ICD-10-CM | POA: Insufficient documentation

## 2015-10-26 LAB — BASIC METABOLIC PANEL
ANION GAP: 7 (ref 5–15)
BUN: 19 mg/dL (ref 6–20)
CALCIUM: 9 mg/dL (ref 8.9–10.3)
CHLORIDE: 106 mmol/L (ref 101–111)
CO2: 24 mmol/L (ref 22–32)
Creatinine, Ser: 0.85 mg/dL (ref 0.44–1.00)
GFR calc non Af Amer: >60 mL/min (ref >60)
Glucose, Bld: 145 mg/dL — ABNORMAL HIGH (ref 65–99)
POTASSIUM: 3.6 mmol/L (ref 3.5–5.1)
Sodium: 137 mmol/L (ref 135–145)

## 2015-10-26 LAB — CBC WITH DIFFERENTIAL/PLATELET
BASOS PCT: 0 %
Basophils Absolute: 0.1 10*3/uL (ref 0.0–0.1)
Eosinophils Absolute: 0.2 10*3/uL (ref 0.0–0.7)
Eosinophils Relative: 1 %
HEMATOCRIT: 41.8 % (ref 36.0–46.0)
HEMOGLOBIN: 13.5 g/dL (ref 12.0–15.0)
LYMPHS ABS: 3.8 10*3/uL (ref 0.7–4.0)
LYMPHS PCT: 27 %
MCH: 28.7 pg (ref 26.0–34.0)
MCHC: 32.3 g/dL (ref 30.0–36.0)
MCV: 88.9 fL (ref 78.0–100.0)
MONOS PCT: 7 %
Monocytes Absolute: 1 10*3/uL (ref 0.1–1.0)
NEUTROS ABS: 9 10*3/uL — AB (ref 1.7–7.7)
NEUTROS PCT: 65 %
Platelets: 343 10*3/uL (ref 150–400)
RBC: 4.7 MIL/uL (ref 3.87–5.11)
RDW: 14.5 % (ref 11.5–15.5)
WBC: 14 10*3/uL — ABNORMAL HIGH (ref 4.0–10.5)

## 2015-10-26 LAB — TROPONIN I
Troponin I: 0.05 ng/mL — ABNORMAL HIGH (ref <0.031)
Troponin I: 0.06 ng/mL — ABNORMAL HIGH (ref <0.031)

## 2015-10-26 MED ORDER — PROPOFOL 10 MG/ML IV BOLUS
INTRAVENOUS | Status: AC | PRN
  Administered 2015-10-26: 47.65 mg via INTRAVENOUS

## 2015-10-26 MED ORDER — PROPOFOL 10 MG/ML IV BOLUS
0.5000 mg/kg | Freq: Once | INTRAVENOUS | Status: AC
  Administered 2015-10-26: 47.7 mg via INTRAVENOUS
  Filled 2015-10-26: qty 20

## 2015-10-26 NOTE — ED Notes (Signed)
Pt requesting for the etCO2 tubing to be taken on and to be put on oxygen. Pt doesn't like the way it makes her face feel. Pt a&o x 4, having a normal conversation with this nurse and maintaining her 02 sats at 98 on RA.

## 2015-10-26 NOTE — Discharge Instructions (Signed)

## 2015-10-26 NOTE — ED Notes (Signed)
04:49  

## 2015-10-26 NOTE — ED Notes (Signed)
Pt is calling her husband for a ride home,

## 2015-10-26 NOTE — ED Notes (Signed)
Pt c/o feeling like her heart is racing since earlier this evening

## 2015-10-26 NOTE — ED Notes (Signed)
Pt instructed to call Dr Myles Gip office by this afternoon if she had not heard anything from them.

## 2015-10-26 NOTE — ED Notes (Signed)
Pt sitting up in her bed texting on her cell phone.

## 2015-10-26 NOTE — ED Provider Notes (Addendum)
CSN: [id number]     Arrival date & time 10/26/15  0243 History   First MD Initiated Contact with Patient 10/26/15 0303     Chief Complaint  Patient presents with  . Tachycardia     (Consider location/radiation/quality/duration/timing/severity/associated sxs/prior Treatment) The history is provided by the patient.  50 year old female with history of paroxysmal atrial fibrillation, hypertrophic cardiomyopathy, COPD had onset at about 10:30 PM of her heart racing and a tight feeling in her chest which she denies dyspnea, nausea, diaphoresis and denies dyspnea. This is similar to what she has had with previous episodes of atrial fibrillation. She was hospitalized in February and required cardioversion at that time. She is anticoagulated on rivaroxaban.  Past Medical History  Diagnosis Date  . COPD (chronic obstructive pulmonary disease) ([place])   . Hypertrophic cardiomyopathy ([place])     a. s/p myomectomy at [facility] 08/2004  . Asthma   . Transient atrial fibrillation ([place]) 2006    a. after myomectomy at [facility] in 08/2004 - was on amiodarone but developed elevated LFTs felt possibly secondary to amiodarone   . Paroxysmal atrial flutter (HCC)     a. post-op after myomectomy in 2006. s/p TEE/DCCV.  Marland Kitchen Cervical cancer ([place])     a. treated with partial removal of cervix   Past Surgical History  Procedure Laterality Date  . Septal myomectomy  2006    [facility]  . Tubal ligation    . Cardioversion N/A 08/12/2015    Procedure: CARDIOVERSION;  Surgeon: Satira Sark, MD;  Location: [facility];  Service: Cardiovascular;  Laterality: N/A;   Family History  Problem Relation Age of Onset  . Heart failure Father   . Diabetes    . Lung disease     Social History  Substance Use Topics  . Smoking status: Current Every Day Smoker -- 1.00 packs/day    Types: Cigarettes  . Smokeless tobacco: None     Comment: since age 11  . Alcohol Use: No   OB History    No data available     Review of Systems   All other systems reviewed and are negative.     Allergies  Amiodarone  Home Medications   Prior to Admission medications   Medication Sig Start Date End Date Taking? Authorizing Provider  ALPRAZolam Duanne Moron) 1 MG tablet Take 1 mg by mouth 3 (three) times daily as needed for anxiety.  05/27/15   Historical Provider, MD  diltiazem (CARDIZEM CD) 300 MG 24 hr capsule Take 1 capsule (300 mg total) by mouth daily. 06/12/15   Maryann Mikhail, DO  HYDROcodone-acetaminophen (NORCO) 10-325 MG tablet Take 1 tablet by mouth every 6 (six) hours as needed for moderate pain or severe pain.  05/27/15   Historical Provider, MD  hydrocortisone cream 1 % Apply 1 application topically 3 (three) times daily as needed for itching (skin irritation). 08/13/15   Nita Sells, MD  levalbuterol Penne Lash) 0.63 MG/3ML nebulizer solution  08/25/15   Historical Provider, MD  pantoprazole (PROTONIX) 40 MG tablet Take 1 tablet (40 mg total) by mouth daily at 6 (six) AM. 08/13/15   Nita Sells, MD  PROAIR HFA 108 (90 Base) MCG/ACT inhaler Inhale 1-2 puffs into the lungs every 6 (six) hours as needed for wheezing or shortness of breath.  05/27/15   Historical Provider, MD  rivaroxaban (XARELTO) 20 MG TABS tablet Take 1 tablet (20 mg total) by mouth daily with supper. 06/12/15   Maryann Mikhail, DO   BP 125/101 mmHg  Pulse 157  Temp(Src) 97.9 F (36.6 C) (Oral)  Resp 19  Ht 5\' 1"  (1.549 m)  Wt 210 lb (95.255 kg)  BMI 39.70 kg/m2  SpO2 97% Physical Exam  Nursing note and vitals reviewed.  50 year old female, resting comfortably and in no acute distress. Vital signs are significant for tachycardia and diastolic hypertension. Oxygen saturation is 97%, which is normal. Head is normocephalic and atraumatic. PERRLA, EOMI. Oropharynx is clear. Neck is nontender and supple without adenopathy or JVD. Back is nontender and there is no CVA tenderness. Lungs are clear without rales, wheezes, or rhonchi. Chest is  nontender. Heart is tachycardic and slightly irregular without murmur. Abdomen is soft, flat, nontender without masses or hepatosplenomegaly and peristalsis is normoactive. Extremities have no cyanosis or edema, full range of motion is present. Skin is warm and dry without rash. Neurologic: Mental status is normal, cranial nerves are intact, there are no motor or sensory deficits.  ED Course  .Cardioversion Date/Time: 10/26/2015 4:45 AM Performed by: Delora Fuel Authorized by: Roxanne Mins, Cathleen Yagi Consent: Verbal consent obtained. Written consent obtained. Risks and benefits: risks, benefits and alternatives were discussed Consent given by: patient Patient understanding: patient states understanding of the procedure being performed Patient consent: the patient's understanding of the procedure matches consent given Procedure consent: procedure consent matches procedure scheduled Relevant documents: relevant documents present and verified Test results: test results available and properly labeled Site marked: the operative site was marked Required items: required blood products, implants, devices, and special equipment available Patient identity confirmed: arm band and verbally with patient Cardioversion basis: emergent Pre-procedure rhythm: atrial fibrillation Patient position: patient was placed in a supine position Chest area: chest area exposed Electrodes: pads Electrodes placed: anterior-posterior Number of attempts: 1 Attempt 1 mode: synchronous Attempt 1 waveform: biphasic Attempt 1 shock (in Joules): 100 Attempt 1 outcome: conversion to normal sinus rhythm Post-procedure rhythm: normal sinus rhythm Complications: no complications Patient tolerance: Patient tolerated the procedure well with no immediate complications   (including critical care time) Procedural sedation Performed by: [id number] Consent: Verbal consent obtained. Risks and benefits: risks, benefits and  alternatives were discussed Required items: required blood products, implants, devices, and special equipment available Patient identity confirmed: arm band and provided demographic data Time out: Immediately prior to procedure a "time out" was called to verify the correct patient, procedure, equipment, support staff and site/side marked as required.  Sedation type: moderate (conscious) sedation NPO time confirmed and considedered  Sedatives: PROPOFOL  Physician Time at Bedside: 35 minutes  Vitals: Vital signs were monitored during sedation. Cardiac Monitor, pulse oximeter Patient tolerance: Patient tolerated the procedure well with no immediate complications. Comments: Pt with uneventful recovered. Returned to pre-procedural sedation baseline  Patient tolerated procedure and procedural sedation component as expected without apparent immediate complications.  Physician confirms procedural medication orders as administered, patient was assessed by physician post-procedure, and confirms post-sedation plan of care and disposition.    Labs Review Results for orders placed or performed during the hospital encounter of [id number]  Basic metabolic panel  Result Value Ref Range   Sodium 137 135 - 145 mmol/L   Potassium 3.6 3.5 - 5.1 mmol/L   Chloride 106 101 - 111 mmol/L   CO2 24 22 - 32 mmol/L   Glucose, Bld 145 (H) 65 - 99 mg/dL   BUN 19 6 - 20 mg/dL   Creatinine, Ser 0.85 0.44 - 1.00 mg/dL   Calcium 9.0 8.9 - 10.3 mg/dL   GFR calc non Af  Amer >60 >60 mL/min   GFR calc Af Amer >60 >60 mL/min   Anion gap 7 5 - 15  CBC with Differential  Result Value Ref Range   WBC 14.0 (H) 4.0 - 10.5 K/uL   RBC 4.70 3.87 - 5.11 MIL/uL   Hemoglobin 13.5 12.0 - 15.0 g/dL   HCT 41.8 36.0 - 46.0 %   MCV 88.9 78.0 - 100.0 fL   MCH 28.7 26.0 - 34.0 pg   MCHC 32.3 30.0 - 36.0 g/dL   RDW 14.5 11.5 - 15.5 %   Platelets 343 150 - 400 K/uL   Neutrophils Relative % 65 %   Neutro Abs 9.0 (H) 1.7 - 7.7 K/uL    Lymphocytes Relative 27 %   Lymphs Abs 3.8 0.7 - 4.0 K/uL   Monocytes Relative 7 %   Monocytes Absolute 1.0 0.1 - 1.0 K/uL   Eosinophils Relative 1 %   Eosinophils Absolute 0.2 0.0 - 0.7 K/uL   Basophils Relative 0 %   Basophils Absolute 0.1 0.0 - 0.1 K/uL  Troponin I  Result Value Ref Range   Troponin I 0.05 (H) <0.031 ng/mL   I have personally reviewed and evaluated these lab results as part of my medical decision-making.   EKG Interpretation   Date/Time:  Monday Oct 26 2015 03:31:43 EDT Ventricular Rate:  162 PR Interval:    QRS Duration: 115 QT Interval:  304 QTC Calculation: 499 R Axis:   -39 Text Interpretation:  Atrial fibrillation Ventricular premature complex  Nonspecific IVCD with LAD LVH with secondary repolarization abnormality  Anterior ST elevation, probably due to LVH Baseline wander in lead(s) V4  When compared with ECG of 08/12/2015, Atrial fibrillation with rapid  ventricular response has replaced Sinus rhythm Tracing is very similar to  ECG of [id number] Confirmed by [facility]  MD, Edwin Cherian ([id number]) on 10/26/2015  3:42:14 AM       EKG Interpretation   Date/Time:  Monday Oct 26 2015 05:18:28 EDT Ventricular Rate:  81 PR Interval:  199 QRS Duration: 121 QT Interval:  381 QTC Calculation: 442 R Axis:   -23 Text Interpretation:  Sinus rhythm Left atrial enlargement LVH with  secondary repolarization abnormality Anterior Q waves, possibly due to LVH  When compared with ECG of EARLIER SAME DATE Sinus rhythm has replaced  Atrial fibrillation with rapid ventricular response Confirmed by Roxanne Mins   MD, Keiyon Plack ([id number]) on 10/26/2015 5:27:09 AM      CRITICAL CARE Performed by: [id number] Total critical care time: 50 minutes Critical care time was exclusive of separately billable procedures and treating other patients. Critical care was necessary to treat or prevent imminent or life-threatening deterioration. Critical care was time spent personally by me on the  following activities: development of treatment plan with patient and/or surrogate as well as nursing, discussions with consultants, evaluation of patient's response to treatment, examination of patient, obtaining history from patient or surrogate, ordering and performing treatments and interventions, ordering and review of laboratory studies, ordering and review of radiographic studies, pulse oximetry and re-evaluation of patient's condition.  MDM   Final diagnoses:  Paroxysmal a-fib (HCC)  Elevated troponin I level    Paroxysmal atrial fibrillation. Old records are reviewed confirming hospitalization in February and treatment with cardioversion. Patient has no contraindications to cardioversion so a cardioversion will be done in the ED. CHADS-VASC score is 1.  After sedation with propofol, patient was cardioverted with successful conversion to sinus rhythm. She was observed in the ED and remained stable  in sinus rhythm. Troponin is come back minimally elevated and this is felt to be demand ischemia. Repeat troponin showed only minimal increase in troponin. Patient continued to be chest pain-free. It is felt that she is stable for discharge without any evidence of ongoing ischemia. She is referred back to her cardiologist for evaluation for possible ablation therapy.  Delora Fuel, MD [id number]  Delora Fuel, MD [id number]

## 2015-11-02 DIAGNOSIS — F17290 Nicotine dependence, other tobacco product, uncomplicated: Secondary | ICD-10-CM | POA: Diagnosis not present

## 2015-11-02 DIAGNOSIS — Z719 Counseling, unspecified: Secondary | ICD-10-CM | POA: Diagnosis not present

## 2015-11-02 DIAGNOSIS — J449 Chronic obstructive pulmonary disease, unspecified: Secondary | ICD-10-CM | POA: Diagnosis not present

## 2015-11-02 DIAGNOSIS — G894 Chronic pain syndrome: Secondary | ICD-10-CM | POA: Diagnosis not present

## 2015-11-18 DIAGNOSIS — J209 Acute bronchitis, unspecified: Secondary | ICD-10-CM | POA: Diagnosis not present

## 2015-11-18 DIAGNOSIS — J449 Chronic obstructive pulmonary disease, unspecified: Secondary | ICD-10-CM | POA: Diagnosis not present

## 2015-11-18 DIAGNOSIS — J019 Acute sinusitis, unspecified: Secondary | ICD-10-CM | POA: Diagnosis not present

## 2015-11-18 DIAGNOSIS — G894 Chronic pain syndrome: Secondary | ICD-10-CM | POA: Diagnosis not present

## 2015-11-18 DIAGNOSIS — J018 Other acute sinusitis: Secondary | ICD-10-CM | POA: Diagnosis not present

## 2015-11-18 DIAGNOSIS — J9801 Acute bronchospasm: Secondary | ICD-10-CM | POA: Diagnosis not present

## 2015-11-18 DIAGNOSIS — Z1389 Encounter for screening for other disorder: Secondary | ICD-10-CM | POA: Diagnosis not present

## 2015-11-26 ENCOUNTER — Encounter: Payer: Self-pay | Admitting: Cardiology

## 2015-11-26 ENCOUNTER — Ambulatory Visit (INDEPENDENT_AMBULATORY_CARE_PROVIDER_SITE_OTHER): Payer: Medicare Other | Admitting: Cardiology

## 2015-11-26 VITALS — BP 120/60 | HR 81 | Ht 61.0 in | Wt 202.0 lb

## 2015-11-26 DIAGNOSIS — I480 Paroxysmal atrial fibrillation: Secondary | ICD-10-CM

## 2015-11-26 DIAGNOSIS — J4520 Mild intermittent asthma, uncomplicated: Secondary | ICD-10-CM | POA: Diagnosis not present

## 2015-11-26 DIAGNOSIS — I4892 Unspecified atrial flutter: Secondary | ICD-10-CM

## 2015-11-26 DIAGNOSIS — Z720 Tobacco use: Secondary | ICD-10-CM | POA: Diagnosis not present

## 2015-11-26 NOTE — Progress Notes (Signed)
Cardiology Office Note  Date: 11/26/2015   ID: Kathleen Wright, DOB [birth year], MRN [id number]  PCP: [name]., MD  Primary Cardiologist: [name], MD   Chief Complaint  Patient presents with  . History of atrial arrhythmias  . HOCM status post myomectomy    History of Present Illness: Kathleen Wright is a 50 y.o. female last seen by Ms. Barrett PA-C in March. She had been admitted to [facility] in February with recurrent atrial flutter/fibrillation and underwent elective cardioversion was successful restoration of sinus rhythm. Outpatient follow-up with Dr. [name] was offered to discuss other EP options for management in case this became a recurring problem, however she elected to pursue observation only so far. She returned to the ER in May around Mother's Day with recurrent palpitations, noted to be in atrial flutter at that time, and was actually cardioverted in the ER and sent home.  Since December 2016 she has had 3 episodes of recurring atrial arrhythmias - 2 requiring cardioversion. Dr. [name] assessed her during her hospital stay at [facility] in December, but at that time she spontaneously converted to sinus rhythm. As noted previously, she had intolerance to amiodarone in the past with significant LFT abnormalities.  I reviewed her medications which are stable from a cardiac perspective. She continues on Xarelto for stroke prophylaxis, Cardizem CD 300 mg daily.  Most recent echocardiogram is outlined below.  Past Medical History  Diagnosis Date  . COPD (chronic obstructive pulmonary disease) ([place])   . Hypertrophic cardiomyopathy ([place])     a. s/p myomectomy at [facility] 08/2004  . Asthma   . Transient atrial fibrillation ([place]) 2006    a. after myomectomy at [facility] in 08/2004 - was on amiodarone but developed elevated LFTs felt possibly secondary to amiodarone   . Paroxysmal atrial flutter (HCC)     a. post-op after myomectomy in 2006. s/p TEE/DCCV.  Marland Kitchen Cervical  cancer ([place])     a. treated with partial removal of cervix    Past Surgical History  Procedure Laterality Date  . Septal myomectomy  2006    [facility]  . Tubal ligation    . Cardioversion N/A 08/12/2015    Procedure: CARDIOVERSION;  Surgeon: Satira Sark, MD;  Location: [facility] ENDO SU[facility];  Service: Cardiovascular;  Laterality: N/A;    Current Outpatient Prescriptions  Medication Sig Dispense Refill  . ALPRAZolam (XANAX) 1 MG tablet Take 1 mg by mouth 3 (three) times daily as needed for anxiety.     Marland Kitchen diltiazem (CARDIZEM CD) 300 MG 24 hr capsule Take 1 capsule (300 mg total) by mouth daily. 30 capsule 0  . HYDROcodone-acetaminophen (NORCO) 10-325 MG tablet Take 1 tablet by mouth every 6 (six) hours as needed for moderate pain or severe pain.     . hydrocortisone cream 1 % Apply 1 application topically 3 (three) times daily as needed for itching (skin irritation). 30 g 0  . levalbuterol (XOPENEX) 0.63 MG/3ML nebulizer solution     . PROAIR HFA 108 (90 Base) MCG/ACT inhaler Inhale 1-2 puffs into the lungs every 6 (six) hours as needed for wheezing or shortness of breath.     . rivaroxaban (XARELTO) 20 MG TABS tablet Take 1 tablet (20 mg total) by mouth daily with supper. 30 tablet 0   No current facility-administered medications for this visit.   Allergies:  Amiodarone   Social History: The patient  reports that she has been smoking Cigarettes.  She has been smoking about  1.00 pack per day. She does not have any smokeless tobacco history on file. She reports that she does not drink alcohol or use illicit drugs.   Family History: The patient's family history includes Heart failure in her father.   ROS:  Please see the history of present illness. Otherwise, complete review of systems is positive for back pain.  All other systems are reviewed and negative.   Physical Exam: VS:  BP 120/60 mmHg  Pulse 81  Ht 5\' 1"  (1.549 m)  Wt 202 lb (91.627 kg)  BMI 38.19 kg/m2  SpO2 92%, BMI Body mass  index is 38.19 kg/(m^2).  Wt Readings from Last 3 Encounters:  11/26/15 202 lb (91.627 kg)  10/26/15 210 lb (95.255 kg)  08/27/15 207 lb (93.895 kg)    General: Overweight woman, appears comfortable at rest. HEENT: Conjunctiva and lids normal, oropharynx clear. Neck: Supple, no elevated JVP or carotid bruits, no thyromegaly. Lungs: Clear to auscultation, nonlabored breathing at rest. Thorax: Well-healed sternal incision. Cardiac: Regular rate and rhythm, no S3 or significant systolic murmur, no pericardial rub. Abdomen: Soft, nontender, bowel sounds present. Extremities: No pitting edema, distal pulses 2+. Skin: Warm and dry. Musculoskeletal: No kyphosis. Neuropsychiatric: Alert and oriented x3, affect grossly appropriate.  ECG: I personally reviewed the tracing from 10/26/2015 which shows probable atypical atrial flutter with aberrantly conducted complex and repolarization abnormalities with IVCD.  Recent Labwork: 08/10/2015: TSH 1.570 08/12/2015: ALT 24; AST 27 10/26/2015: BUN 19; Creatinine, Ser 0.85; Hemoglobin 13.5; Platelets 343; Potassium 3.6; Sodium 137   Other Studies Reviewed Today:  Echocardiogram 06/10/2015: Study Conclusions  - Left ventricle: Post septal myectomy with scarring mid and apical septum moderately thickened No SAM or LVOT gradient. The cavity size was normal. Wall thickness was increased in a pattern of moderate LVH. Systolic function was normal. The estimated ejection fraction was in the range of 60% to 65%. - Mitral valve: Calcified annulus. There was mild regurgitation. - Left atrium: The atrium was moderately dilated. - Atrial septum: No defect or patent foramen ovale was identified.  Assessment and Plan:  1. Recurrent atrial arrhythmias including atrial fibrillation and atrial flutter. This was originally an issue after she underwent septal myomectomy for treatment of HOCM at [place] back in 2006. She developed significant LFT abnormalities  on amiodarone at that time which was discontinued. She had done well for years but within the last 6 months has now had 3 recurrent episodes of arrhythmia and 2 of these required cardioversion. She will continue on Xarelto and Cardizem CD, I have again recommended that she see Dr. [name] to discuss other EP options for management as this is likely to continue to recur. She is in agreement.  2. History of HOCM status post septal myomectomy at [facility] in 2006. Echocardiogram from December 2016 showed LVEF 60-65% with no significant LVOT gradient.  3. Tobacco abuse. Smoking cessation has been discussed.  4. Asthma, uses Pro Air HFA and Xopenex. She follows with Dr. Gerarda Fraction.  Current medicines were reviewed with the patient today.   Orders Placed This Encounter  Procedures  . Ambulatory referral to Cardiac Electrophysiology    Disposition: Follow-up with me in 3 months.  Signed, Satira Sark, MD, [employer] 11/26/2015 9:44 AM    [facility] at [facility] [address] [place], [place] [address] Phone: [phone]; Fax: [phone]

## 2015-11-26 NOTE — Patient Instructions (Signed)
Your physician recommends that you schedule a follow-up appointment in:  3 months with Dr Ferne Reus have been referred to Dr [name] at the [facility] office    Your physician recommends that you continue on your current medications as directed. Please refer to the Current Medication list given to you today.      Thank you for choosing [facility] !

## 2015-12-02 ENCOUNTER — Ambulatory Visit: Payer: Medicare Other | Admitting: Cardiology

## 2015-12-17 ENCOUNTER — Encounter: Payer: Self-pay | Admitting: Internal Medicine

## 2015-12-27 ENCOUNTER — Emergency Department (HOSPITAL_COMMUNITY): Payer: Medicare Other

## 2015-12-27 ENCOUNTER — Observation Stay (HOSPITAL_COMMUNITY)
Admission: EM | Admit: 2015-12-27 | Discharge: 2015-12-28 | Disposition: A | Discharge status: Home / Self Care | Payer: Medicare Other | Attending: Internal Medicine | Admitting: Internal Medicine

## 2015-12-27 ENCOUNTER — Encounter (HOSPITAL_COMMUNITY): Payer: Self-pay | Admitting: *Deleted

## 2015-12-27 DIAGNOSIS — J449 Chronic obstructive pulmonary disease, unspecified: Secondary | ICD-10-CM | POA: Insufficient documentation

## 2015-12-27 DIAGNOSIS — J45909 Unspecified asthma, uncomplicated: Secondary | ICD-10-CM | POA: Diagnosis not present

## 2015-12-27 DIAGNOSIS — I484 Atypical atrial flutter: Secondary | ICD-10-CM | POA: Diagnosis present

## 2015-12-27 DIAGNOSIS — Z9889 Other specified postprocedural states: Secondary | ICD-10-CM

## 2015-12-27 DIAGNOSIS — I4892 Unspecified atrial flutter: Secondary | ICD-10-CM | POA: Insufficient documentation

## 2015-12-27 DIAGNOSIS — I421 Obstructive hypertrophic cardiomyopathy: Secondary | ICD-10-CM | POA: Diagnosis present

## 2015-12-27 DIAGNOSIS — R7989 Other specified abnormal findings of blood chemistry: Secondary | ICD-10-CM

## 2015-12-27 DIAGNOSIS — I480 Paroxysmal atrial fibrillation: Secondary | ICD-10-CM | POA: Diagnosis not present

## 2015-12-27 DIAGNOSIS — R000 Tachycardia, unspecified: Secondary | ICD-10-CM | POA: Diagnosis present

## 2015-12-27 DIAGNOSIS — Z8679 Personal history of other diseases of the circulatory system: Secondary | ICD-10-CM

## 2015-12-27 DIAGNOSIS — F17210 Nicotine dependence, cigarettes, uncomplicated: Secondary | ICD-10-CM | POA: Insufficient documentation

## 2015-12-27 DIAGNOSIS — R002 Palpitations: Secondary | ICD-10-CM | POA: Diagnosis not present

## 2015-12-27 DIAGNOSIS — R778 Other specified abnormalities of plasma proteins: Secondary | ICD-10-CM | POA: Diagnosis present

## 2015-12-27 DIAGNOSIS — I471 Supraventricular tachycardia: Principal | ICD-10-CM | POA: Insufficient documentation

## 2015-12-27 LAB — CBC
HEMATOCRIT: 37.9 % (ref 36.0–46.0)
HEMOGLOBIN: 12.5 g/dL (ref 12.0–15.0)
MCH: 28.9 pg (ref 26.0–34.0)
MCHC: 33 g/dL (ref 30.0–36.0)
MCV: 87.5 fL (ref 78.0–100.0)
Platelets: 324 10*3/uL (ref 150–400)
RBC: 4.33 MIL/uL (ref 3.87–5.11)
RDW: 15.7 % — AB (ref 11.5–15.5)
WBC: 12.3 10*3/uL — AB (ref 4.0–10.5)

## 2015-12-27 LAB — BASIC METABOLIC PANEL
ANION GAP: 5 (ref 5–15)
BUN: 17 mg/dL (ref 6–20)
CALCIUM: 8.6 mg/dL — AB (ref 8.9–10.3)
CO2: 24 mmol/L (ref 22–32)
Chloride: 111 mmol/L (ref 101–111)
Creatinine, Ser: 0.94 mg/dL (ref 0.44–1.00)
GFR calc Af Amer: >60 mL/min (ref >60)
Glucose, Bld: 117 mg/dL — ABNORMAL HIGH (ref 65–99)
POTASSIUM: 4 mmol/L (ref 3.5–5.1)
SODIUM: 140 mmol/L (ref 135–145)

## 2015-12-27 LAB — TROPONIN I: TROPONIN I: 0.04 ng/mL — AB (ref <0.03)

## 2015-12-27 MED ORDER — DILTIAZEM HCL 25 MG/5ML IV SOLN
10.0000 mg | Freq: Once | INTRAVENOUS | Status: AC
  Administered 2015-12-27: 10 mg via INTRAVENOUS
  Filled 2015-12-27: qty 5

## 2015-12-27 MED ORDER — METOPROLOL TARTRATE 5 MG/5ML IV SOLN
5.0000 mg | Freq: Once | INTRAVENOUS | Status: AC
  Administered 2015-12-27: 5 mg via INTRAVENOUS
  Filled 2015-12-27: qty 5

## 2015-12-27 MED ORDER — DILTIAZEM HCL 60 MG PO TABS: 60.0000 mg | ORAL_TABLET | Freq: Once | ORAL | Status: DC

## 2015-12-27 NOTE — ED Notes (Signed)
CRITICAL VALUE ALERT  Critical value received:  Troponin 0.04  Date of notification:  12/27/15  Time of notification:  2205 hrs  Critical value read back:Yes.    Nurse who received alert:  Y. Zayveon Raschke, RN  Responding MD:  Dr. Lacinda Axon  Time MD responded:  2206 hrs

## 2015-12-27 NOTE — ED Provider Notes (Signed)
CSN: [id number]     Arrival date & time 12/27/15  2058 History  By signing my name below, I, Ephriam Jenkins, attest that this documentation has been prepared under the direction and in the presence of Nat Christen, MD. Electronically signed, Ephriam Jenkins, ED Scribe. 12/27/2015. 9:45 PM.   Chief Complaint  Patient presents with  . Tachycardia   The history is provided by the patient. No language interpreter was used.   HPI Comments: Kathleen Wright is a 50 y.o. female with a PMHx of COPD, Paroxysmal atrial flutter, who presents to the Emergency Department complaining of a constant, unchanged, rapid heartbeat that started 45 minutes ago. Per triage note, pt states she was sitting in a chair at home when she had a sudden onset episode of chest pressure and rapid heart rate. Pt states she has a Hx of 3 similar episodes in the past with the same symptoms; tonight is the 4th episode. Pt states the first episode was in December 2016 and was seen at [facility]. Pt states she had a Cardioversion performed, was admitted to [facility] for 4 days and was discharged with no complications. Pt reports second episode in February 2017 and third episode in May 2017. Pt states she was seen here at [facility] ED for the episode in February and reports that she had Cardioversion performed again and was admitted here for 3 days. Pt reports she has had a Cardioversion performed every time she has been seen for same symptoms. When asked for further details about the Cardioversion, she reports, "They do it in the Emergency Room".  Pt states she had a Septal myomectomy at [facility] in 2006. Pt states she takes 20mg  Xarelto qd. Pt denies any current chest pain or shortness of breath.    Past Medical History  Diagnosis Date  . COPD (chronic obstructive pulmonary disease) ([place])   . Hypertrophic cardiomyopathy ([place])     a. s/p myomectomy at [facility] 08/2004  . Asthma   . Transient atrial fibrillation ([place]) 2006    a. after myomectomy at [facility] in 08/2004 -  was on amiodarone but developed elevated LFTs felt possibly secondary to amiodarone   . Paroxysmal atrial flutter (HCC)     a. post-op after myomectomy in 2006. s/p TEE/DCCV.  Marland Kitchen Cervical cancer ([place])     a. treated with partial removal of cervix   Past Surgical History  Procedure Laterality Date  . Septal myomectomy  2006    [facility]  . Tubal ligation    . Cardioversion N/A 08/12/2015    Procedure: CARDIOVERSION;  Surgeon: Satira Sark, MD;  Location: [facility]O [facility];  Service: Cardiovascular;  Laterality: N/A;   Family History  Problem Relation Age of Onset  . Heart failure Father   . Diabetes    . Lung disease     Social History  Substance Use Topics  . Smoking status: Current Every Day Smoker -- 1.00 packs/day    Types: Cigarettes  . Smokeless tobacco: None     Comment: since age 63  . Alcohol Use: No   OB History    No data available     Review of Systems  A complete 10 system review of systems was obtained and all systems are negative except as noted in the HPI and PMH.   Allergies  Amiodarone  Home Medications   Prior to Admission medications   Medication Sig Start Date End Date Taking? Authorizing Provider  ALPRAZolam Duanne Moron) 1 MG tablet Take 1 mg  by mouth 3 (three) times daily as needed for anxiety.  05/27/15   Historical Provider, MD  diltiazem (CARDIZEM CD) 300 MG 24 hr capsule Take 1 capsule (300 mg total) by mouth daily. 06/12/15   Maryann Mikhail, DO  HYDROcodone-acetaminophen (NORCO) 10-325 MG tablet Take 1 tablet by mouth every 6 (six) hours as needed for moderate pain or severe pain.  05/27/15   Historical Provider, MD  hydrocortisone cream 1 % Apply 1 application topically 3 (three) times daily as needed for itching (skin irritation). 08/13/15   Nita Sells, MD  levalbuterol Penne Lash) 0.63 MG/3ML nebulizer solution  08/25/15   Historical Provider, MD  PROAIR HFA 108 (90 Base) MCG/ACT inhaler Inhale 1-2 puffs into the lungs every 6 (six) hours as  needed for wheezing or shortness of breath.  05/27/15   Historical Provider, MD  rivaroxaban (XARELTO) 20 MG TABS tablet Take 1 tablet (20 mg total) by mouth daily with supper. 06/12/15   Maryann Mikhail, DO   BP 95/76 mmHg  Pulse 133  Temp(Src) 98.5 F (36.9 C) (Oral)  Resp 15  Ht 5\' 1"  (1.549 m)  Wt 200 lb (90.719 kg)  BMI 37.81 kg/m2  SpO2 98% Physical Exam  Constitutional: She is oriented to person, place, and time. She appears well-developed and well-nourished.  HENT:  Head: Normocephalic and atraumatic.  Right Ear: External ear normal.  Left Ear: External ear normal.  Eyes: Conjunctivae are normal. No scleral icterus.  Neck: No tracheal deviation present.  Cardiovascular:  Tachycardia  Pulmonary/Chest: Effort normal. No respiratory distress.  Abdominal: She exhibits no distension.  Musculoskeletal: Normal range of motion.  Neurological: She is alert and oriented to person, place, and time.  Skin: Skin is warm and dry.  Psychiatric: She has a normal mood and affect. Her behavior is normal.    ED Course  Procedures  DIAGNOSTIC STUDIES: Oxygen Saturation is 99% on RA, normal by my interpretation.  COORDINATION OF CARE: 9:31 PM-Will order blood work and imaging. Discussed treatment plan with pt at bedside and pt agreed to plan.   Labs Review Labs Reviewed  BASIC METABOLIC PANEL - Abnormal; Notable for the following:    Glucose, Bld 117 (*)    Calcium 8.6 (*)    All other components within normal limits  CBC - Abnormal; Notable for the following:    WBC 12.3 (*)    RDW 15.7 (*)    All other components within normal limits  TROPONIN I - Abnormal; Notable for the following:    Troponin I 0.04 (*)    All other components within normal limits    Imaging Review No results found. I have personally reviewed and evaluated these images and lab results as part of my medical decision-making.   EKG Interpretation   Date/Time:  Sunday December 27 2015 21:05:38  EDT Ventricular Rate:  140 PR Interval:    QRS Duration: 115 QT Interval:  354 QTC Calculation: 541 R Axis:   16 Text Interpretation:  Sinus or ectopic atrial tachycardia LVH with  secondary repolarization abnormality Anterior Q waves, possibly due to LVH  ST depression, consider ischemia, diffuse lds Prolonged QT interval  Confirmed by Lacinda Axon  MD, Gill Delrossi ([id number]) on 12/27/2015 10:08:00 PM     CRITICAL CARE Performed by: Nat Christen Total critical care time: 30 minutes Critical care time was exclusive of separately billable procedures and treating other patients. Critical care was necessary to treat or prevent imminent or life-threatening deterioration. Critical care was time spent personally by me  on the following activities: development of treatment plan with patient and/or surrogate as well as nursing, discussions with consultants, evaluation of patient's response to treatment, examination of patient, obtaining history from patient or surrogate, ordering and performing treatments and interventions, ordering and review of laboratory studies, ordering and review of radiographic studies, pulse oximetry and re-evaluation of patient's condition. MDM   Final diagnoses:  SVT (supraventricular tachycardia) ([place])   Patient presents with palpitations and a supraventricular tachycardia rate of 140. Discussed with cardiologist at [facility]. IV Lopressor 5 mg given follow by IV Cardizem 10 mg.  Patient's rate was decreased to 82 bpm with a rhythm of atrial flutter. It was the opinion of the cardiologist to admit to [facility] and have cardiology see her in the morning for possible cardioversion.      I personally performed the services described in this documentation, which was scribed in my presence. The recorded information has been reviewed and is accurate.      Nat Christen, MD 12/29/15 [phone]

## 2015-12-27 NOTE — ED Notes (Addendum)
Pt was siiting in a chair at home watching tv when she had an episode of chest pressure and rapid heart rate. Pt states her heart rate was (137 bmp). Pt states this happens about every 2 months. Pt has an appt at the end of the month with her heart doctor.  Pt denies cheat pain at this time.

## 2015-12-28 ENCOUNTER — Encounter (HOSPITAL_COMMUNITY): Payer: Self-pay | Admitting: *Deleted

## 2015-12-28 DIAGNOSIS — I471 Supraventricular tachycardia: Secondary | ICD-10-CM | POA: Diagnosis not present

## 2015-12-28 DIAGNOSIS — I421 Obstructive hypertrophic cardiomyopathy: Secondary | ICD-10-CM

## 2015-12-28 DIAGNOSIS — J45909 Unspecified asthma, uncomplicated: Secondary | ICD-10-CM | POA: Diagnosis not present

## 2015-12-28 DIAGNOSIS — Z8679 Personal history of other diseases of the circulatory system: Secondary | ICD-10-CM

## 2015-12-28 DIAGNOSIS — R7989 Other specified abnormal findings of blood chemistry: Secondary | ICD-10-CM

## 2015-12-28 DIAGNOSIS — I4892 Unspecified atrial flutter: Secondary | ICD-10-CM

## 2015-12-28 DIAGNOSIS — J449 Chronic obstructive pulmonary disease, unspecified: Secondary | ICD-10-CM | POA: Diagnosis not present

## 2015-12-28 LAB — TROPONIN I
TROPONIN I: 0.05 ng/mL — AB (ref <0.03)
Troponin I: 0.05 ng/mL (ref <0.03)
Troponin I: 0.04 ng/mL (ref <0.03)

## 2015-12-28 MED ORDER — DILTIAZEM HCL ER COATED BEADS 180 MG PO CP24
300.0000 mg | ORAL_CAPSULE | Freq: Every day | ORAL | Status: DC
  Administered 2015-12-28: 300 mg via ORAL
  Filled 2015-12-28: qty 1

## 2015-12-28 MED ORDER — ALBUTEROL SULFATE (2.5 MG/3ML) 0.083% IN NEBU: 3.0000 mL | INHALATION_SOLUTION | Freq: Four times a day (QID) | RESPIRATORY_TRACT | Status: DC | PRN

## 2015-12-28 MED ORDER — DILTIAZEM HCL 60 MG PO TABS
60.0000 mg | ORAL_TABLET | Freq: Once | ORAL | Status: AC
  Administered 2015-12-28: 60 mg via ORAL
  Filled 2015-12-28: qty 1

## 2015-12-28 MED ORDER — HYDROCODONE-ACETAMINOPHEN 10-325 MG PO TABS: 1.0000 | ORAL_TABLET | Freq: Four times a day (QID) | ORAL | Status: DC | PRN

## 2015-12-28 MED ORDER — ALPRAZOLAM 1 MG PO TABS: 1.0000 mg | ORAL_TABLET | Freq: Three times a day (TID) | ORAL | Status: DC | PRN

## 2015-12-28 MED ORDER — SODIUM CHLORIDE 0.9 % IV SOLN: INTRAVENOUS | Status: DC

## 2015-12-28 MED ORDER — METOPROLOL TARTRATE 25 MG PO TABS: 12.5000 mg | ORAL_TABLET | Freq: Two times a day (BID) | ORAL | Status: DC

## 2015-12-28 MED ORDER — ACETAMINOPHEN 325 MG PO TABS: 650.0000 mg | ORAL_TABLET | ORAL | Status: DC | PRN

## 2015-12-28 MED ORDER — METOPROLOL TARTRATE 25 MG PO TABS
12.5000 mg | ORAL_TABLET | Freq: Two times a day (BID) | ORAL | Status: DC
  Administered 2015-12-28: 12.5 mg via ORAL
  Filled 2015-12-28: qty 1

## 2015-12-28 MED ORDER — ONDANSETRON HCL 4 MG/2ML IJ SOLN
4.0000 mg | Freq: Four times a day (QID) | INTRAMUSCULAR | Status: DC | PRN
Start: 2015-12-28 — End: 2015-12-28

## 2015-12-28 MED ORDER — RIVAROXABAN 20 MG PO TABS
20.0000 mg | ORAL_TABLET | Freq: Every day | ORAL | Status: DC
  Administered 2015-12-28: 20 mg via ORAL
  Filled 2015-12-28: qty 1

## 2015-12-28 MED ORDER — METOPROLOL TARTRATE 25 MG PO TABS
12.5000 mg | ORAL_TABLET | Freq: Once | ORAL | Status: AC
  Administered 2015-12-28: 12.5 mg via ORAL
  Filled 2015-12-28: qty 1

## 2015-12-28 MED ORDER — ASPIRIN EC 325 MG PO TBEC
325.0000 mg | DELAYED_RELEASE_TABLET | Freq: Every day | ORAL | Status: DC
  Filled 2015-12-28: qty 1

## 2015-12-28 NOTE — Consult Note (Signed)
Primary cardiologist: Kathleen [name] Consulting cardiologist: Kathleen Carlyle Dolly Requesting physician: Kathleen Lelon Frohlich Indication: Aflutter with RVR   Clinical Summary Kathleen Wright is a 50 y.o.female history of afib/aflutter with prior multiple DCCVs, HOCM with prior myomectomy, admitted with palpitations. From Kathleen McDowell's last clinic note recurrence of symptomatic atrial arrhythmias over the last few months, she has been set up to see EP and has appointment 01/06/16 with Kathleen Wright. Amiodarone intolerance with elevated LFTs, has been managed with CCB at home. Episode of palpitations started yesterday. Similar onset to her previous episodes, initial chest pressure followed by feeling of heart racing/skipping.    WBC 16.5, Hgb 14.1, Plt 319, K 4.4, Cr 0.77, flat trop 0.04 CXR no acute process EKG aflutter with RVR    Allergies  Allergen Reactions  . Amiodarone Other (See Comments)    Liver function    Medications Scheduled Medications: . sodium chloride   Intravenous STAT  . aspirin EC  325 mg Oral Daily  . diltiazem  300 mg Oral Daily  . rivaroxaban  20 mg Oral Q supper     Infusions:     PRN Medications:  acetaminophen, albuterol, ALPRAZolam, HYDROcodone-acetaminophen, ondansetron (ZOFRAN) IV   Past Medical History  Diagnosis Date  . COPD (chronic obstructive pulmonary disease) ([place])   . Hypertrophic cardiomyopathy ([place])     a. s/p myomectomy at [facility] 08/2004  . Asthma   . Transient atrial fibrillation ([place]) 2006    a. after myomectomy at [facility] in 08/2004 - was on amiodarone but developed elevated LFTs felt possibly secondary to amiodarone   . Paroxysmal atrial flutter (HCC)     a. post-op after myomectomy in 2006. s/p TEE/DCCV.  Marland Kitchen Cervical cancer ([place])     a. treated with partial removal of cervix    Past Surgical History  Procedure Laterality Date  . Septal myomectomy  2006    [facility]  . Tubal ligation    . Cardioversion N/A 08/12/2015   Procedure: CARDIOVERSION;  Surgeon: Satira Sark, MD;  Location: [facility];  Service: Cardiovascular;  Laterality: N/A;    Family History  Problem Relation Age of Onset  . Heart failure Father   . Diabetes    . Lung disease      Social History Ms. Silverstone reports that she has been smoking Cigarettes.  She has been smoking about 1.00 pack per day. She does not have any smokeless tobacco history on file. Ms. Magley reports that she does not drink alcohol.  Review of Systems CONSTITUTIONAL: No weight loss, fever, chills, weakness or fatigue.  HEENT: Eyes: No visual loss, blurred vision, double vision or yellow sclerae. No hearing loss, sneezing, congestion, runny nose or sore throat.  SKIN: No rash or itching.  CARDIOVASCULAR: per HPI RESPIRATORY: No shortness of breath, cough or sputum.  GASTROINTESTINAL: No anorexia, nausea, vomiting or diarrhea. No abdominal pain or blood.  GENITOURINARY: no polyuria, no dysuria NEUROLOGICAL: No headache, dizziness, syncope, paralysis, ataxia, numbness or tingling in the extremities. No change in bowel or bladder control.  MUSCULOSKELETAL: No muscle, back pain, joint pain or stiffness.  HEMATOLOGIC: No anemia, bleeding or bruising.  LYMPHATICS: No enlarged nodes. No history of splenectomy.  PSYCHIATRIC: No history of depression or anxiety.      Physical Examination Blood pressure 96/68, pulse 62, temperature 97.5 F (36.4 C), temperature source Oral, resp. rate 20, height 5\' 1"  (1.549 m), weight 206 lb 5.6 oz (93.6 kg), SpO2 98 %. No intake or  output data in the 24 hours ending 12/28/15 1128  HEENT: sclera clear, throat clear  Cardiovascular: irreg, rate 60, no m/r/g, no jvd  Respiratory: CTAB  GI: abdomen soft, NT, ND  MSK: no LE edema  Neuro: no focal deficits  Psych: appropriate affect   Lab Results  Basic Metabolic Panel:  Recent Labs Lab 12/27/15 2110  NA 140  K 4.0  CL 111  CO2 24  GLUCOSE 117*  BUN 17    CREATININE 0.94  CALCIUM 8.6*    Liver Function Tests: No results for input(s): AST, ALT, ALKPHOS, BILITOT, PROT, ALBUMIN in the last 168 hours.  CBC:  Recent Labs Lab 12/27/15 2110  WBC 12.3*  HGB 12.5  HCT 37.9  MCV 87.5  PLT 324    Cardiac Enzymes:  Recent Labs Lab 12/27/15 2110 12/28/15 0236 12/28/15 0508 12/28/15 0844  TROPONINI 0.04* 0.05* 0.05* 0.04*    BNP: Invalid input(s): POCBNP    Impression/Recommendations 1. Aflutter - multiple DCCVs, has not maintained SR. Intolerant to amiodarone. Admitted with aflutter with RVR, now rate controlled. Has EP appointment for next week to discuss treatment options - EKG this AM shows rate controlled aflutter - CHADS2Vasc score of 1, she is on xarelto. She also has nontraditional risk factor for AF and stroke with her history of HOCM.  - will continue dilt 300mg  daily, add lopressor 12.5mg  bid and see how tolerated. She has had some soft bp's though by manual check this AM she is 120/70 as well as low heart rates in early AM hours  2. Elevated troponin - mild flat elevation in setting of tachycardia nonspecific finding - no ischemic workup planned at this time.    Carlyle Dolly, M.D

## 2015-12-28 NOTE — Discharge Summary (Signed)
Physician Discharge Summary  Kathleen Wright [id number] DOB: [birth year] DOA: 12/27/2015  PCP: [name]., MD  Admit date: 12/27/2015 Discharge date: 12/28/2015  Time spent: 45 minutes  Recommendations for Outpatient Follow-up:  Will be discharged home today Advised to follow up with primary care physician in 2 weeks EP appointment is scheduled   Discharge Diagnoses:  Principal Problem:   Atrial flutter with rapid ventricular response (HCC) Active Problems:   History of ventricular septal myectomy   Hypertrophic obstructive cardiomyopathy (HCC)   History of atrial fibrillation   Elevated troponin   COPD (chronic obstructive pulmonary disease) ([place])   Discharge Condition: Stable and improved  Filed Weights   12/27/15 2106 12/28/15 0144  Weight: 90.719 kg (200 lb) 93.6 kg (206 lb 5.6 oz)    History of present illness:  As per Dr. Shanon Brow on 7/17: Kathleen Wright is a 50 y.o. female with medical history significant of HOCM s/p myomectomy at [facility], aflutter/afib s/p ablation, copd comes in with chest pressure and heart racing. This has been happening to patient more frequently in the last 36months and sounds like she has appt with [facility] next week for consideration of another procedure. Pt was given lopressor and cardizem in the ED, she responded to cardizem 10mg  iv push. She is on oral daily dose of long acting cardizem. No recent illnesses. Pt referred for admission for observation for her svt.  Hospital Course:   A flutter with RVR -Has had multiple DCCVs and has not been able to maintain sinus rhythm, is intolerant to amiodarone.  -her rate is now controlled. She has an EP appointment next week to discuss treatment options. -She was on 300 mg daily of diltiazem, Lopressor 12.5 mg twice daily has been added by cardiology. -She is on xarelto for anticoagulation purposes.  Elevated troponin -Mild flat elevation of setting of tachycardia, no ischemic  workup plan for at this time.  Procedures:  None   Consultations:  Cardiology  Discharge Instructions  Discharge Instructions    Diet - low sodium heart healthy    Complete by:  As directed      Increase activity slowly    Complete by:  As directed             Medication List    STOP taking these medications        furosemide 20 MG tablet  Commonly known as:  LASIX     hydrocortisone cream 1 %     potassium chloride SA 20 MEQ tablet  Commonly known as:  K-DUR,KLOR-CON      TAKE these medications        ALPRAZolam 1 MG tablet  Commonly known as:  XANAX  Take 1 mg by mouth 3 (three) times daily as needed for anxiety.     diltiazem 300 MG 24 hr capsule  Commonly known as:  CARDIZEM CD  Take 1 capsule (300 mg total) by mouth daily.     HYDROcodone-acetaminophen 10-325 MG tablet  Commonly known as:  NORCO  Take 1 tablet by mouth every 6 (six) hours as needed for moderate pain or severe pain.     ibuprofen 800 MG tablet  Commonly known as:  ADVIL,MOTRIN  Take 1 tablet by mouth 3 (three) times daily as needed for moderate pain.     levalbuterol 0.63 MG/3ML nebulizer solution  Commonly known as:  XOPENEX  Take 0.63 mg by nebulization every 6 (six) hours as needed for wheezing or shortness  of breath.     metoprolol tartrate 25 MG tablet  Commonly known as:  LOPRESSOR  Take 0.5 tablets (12.5 mg total) by mouth 2 (two) times daily.     PROAIR HFA 108 (90 Base) MCG/ACT inhaler  Generic drug:  albuterol  Inhale 1-2 puffs into the lungs every 6 (six) hours as needed for wheezing or shortness of breath.     rivaroxaban 20 MG Tabs tablet  Commonly known as:  XARELTO  Take 1 tablet (20 mg total) by mouth daily with supper.       Allergies  Allergen Reactions  . Amiodarone Other (See Comments)    Liver function       Follow-up Information    Follow up with [name]., MD. Schedule an appointment as soon as possible for a visit in 2 weeks.    Specialty:  Internal Medicine   Contact information:   [address] [place] [id number] [phone]        The results of significant diagnostics from this hospitalization (including imaging, microbiology, ancillary and laboratory) are listed below for reference.    Significant Diagnostic Studies: Dg Chest 2 View  12/27/2015  CLINICAL DATA:  Initial evaluation for acute palpitations. EXAM: CHEST  2 VIEW COMPARISON:  Prior radiograph from 08/10/2015. FINDINGS: Median sternotomy wires noted. Cardiomegaly is stable. Mediastinal silhouette within normal limits. Lungs normally inflated. No focal infiltrate, pulmonary edema, or pleural effusion. No pneumothorax. No acute osseus abnormality. IMPRESSION: Stable appearance of the chest. No active cardiopulmonary disease identified. Electronically Signed   By: Jeannine Boga M.D.   On: 12/27/2015 23:53    Microbiology: No results found for this or any previous visit (from the past 240 hour(s)).   Labs: Basic Metabolic Panel:  Recent Labs Lab 12/27/15 2110  NA 140  K 4.0  CL 111  CO2 24  GLUCOSE 117*  BUN 17  CREATININE 0.94  CALCIUM 8.6*   Liver Function Tests: No results for input(s): AST, ALT, ALKPHOS, BILITOT, PROT, ALBUMIN in the last 168 hours. No results for input(s): LIPASE, AMYLASE in the last 168 hours. No results for input(s): AMMONIA in the last 168 hours. CBC:  Recent Labs Lab 12/27/15 2110  WBC 12.3*  HGB 12.5  HCT 37.9  MCV 87.5  PLT 324   Cardiac Enzymes:  Recent Labs Lab 12/27/15 2110 12/28/15 0236 12/28/15 0508 12/28/15 0844  TROPONINI 0.04* 0.05* 0.05* 0.04*   BNP: BNP (last 3 results) No results for input(s): BNP in the last 8760 hours.  ProBNP (last 3 results) No results for input(s): PROBNP in the last 8760 hours.  CBG: No results for input(s): GLUCAP in the last 168 hours.     SignedLelon Frohlich  [facility] Pager: [phone] 12/28/2015, 5:23  PM

## 2015-12-28 NOTE — Progress Notes (Signed)
Patients HR up this morning as high as 200's paged on call MD. Will follow new orders received and continue to monitor the patient.

## 2015-12-28 NOTE — Progress Notes (Signed)
Discharge instructions read to patient and her spouse.  Both verbalized understanding of all instructions. Discharged to home with spouse 

## 2015-12-28 NOTE — Care Management Obs Status (Signed)
[place] NOTIFICATION   Patient Details  Name: LYRICAL CHADDERDON MRN: [id number] Date of Birth: [birth year]   Medicare Observation Status Notification Given:  Yes    Briant Sites, RN 12/28/2015, 4:08 PM

## 2015-12-28 NOTE — H&P (Signed)
History and Physical    Kathleen Wright [id number] DOB: 03-06-1966 DOA: 12/27/2015  PCP: [name]., MD  Patient coming from: home  Chief Complaint: heart racing  HPI: Kathleen Wright is a 50 y.o. female with medical history significant of HOCM s/p myomectomy at [facility], aflutter/afib s/p ablation, copd comes in with chest pressure and heart racing.  This has been happening to patient more frequently in the last 65months and sounds like she has appt with [facility] next week for consideration of another procedure.  Pt was given lopressor and cardizem in the ED, she responded to cardizem 10mg  iv push.  She is on oral daily dose of long acting cardizem.  No recent illnesses.  Pt referred for admission for observation for her svt.  Review of Systems: As per HPI otherwise 10 point review of systems negative.   Past Medical History  Diagnosis Date  . COPD (chronic obstructive pulmonary disease) ([place])   . Hypertrophic cardiomyopathy ([place])     a. s/p myomectomy at [facility] 08/2004  . Asthma   . Transient atrial fibrillation ([place]) 2006    a. after myomectomy at [facility] in 08/2004 - was on amiodarone but developed elevated LFTs felt possibly secondary to amiodarone   . Paroxysmal atrial flutter (HCC)     a. post-op after myomectomy in 2006. s/p TEE/DCCV.  Marland Kitchen Cervical cancer ([place])     a. treated with partial removal of cervix    Past Surgical History  Procedure Laterality Date  . Septal myomectomy  2006    [facility]  . Tubal ligation    . Cardioversion N/A 08/12/2015    Procedure: CARDIOVERSION;  Surgeon: Satira Sark, MD;  Location[facility];  Service: Cardiovascular;  Laterality: N/A;     reports that she has been smoking Cigarettes.  She has been smoking about 1.00 pack per day. She does not have any smokeless tobacco history on file. She reports that she does not drink alcohol or use illicit drugs.  Allergies  Allergen Reactions  . Amiodarone Other (See Comments)    Liver  function    Family History  Problem Relation Age of Onset  . Heart failure Father   . Diabetes    . Lung disease      Prior to Admission medications   Medication Sig Start Date End Date Taking? Authorizing Provider  ALPRAZolam Duanne Moron) 1 MG tablet Take 1 mg by mouth 3 (three) times daily as needed for anxiety.  05/27/15   Historical Provider, MD  diltiazem (CARDIZEM CD) 300 MG 24 hr capsule Take 1 capsule (300 mg total) by mouth daily. 06/12/15   Maryann Mikhail, DO  HYDROcodone-acetaminophen (NORCO) 10-325 MG tablet Take 1 tablet by mouth every 6 (six) hours as needed for moderate pain or severe pain.  05/27/15   Historical Provider, MD  hydrocortisone cream 1 % Apply 1 application topically 3 (three) times daily as needed for itching (skin irritation). 08/13/15   Nita Sells, MD  levalbuterol Penne Lash) 0.63 MG/3ML nebulizer solution  08/25/15   Historical Provider, MD  PROAIR HFA 108 (90 Base) MCG/ACT inhaler Inhale 1-2 puffs into the lungs every 6 (six) hours as needed for wheezing or shortness of breath.  05/27/15   Historical Provider, MD  rivaroxaban (XARELTO) 20 MG TABS tablet Take 1 tablet (20 mg total) by mouth daily with supper. 06/12/15   Cristal Ford, DO    Physical Exam: Filed Vitals:   12/28/15 0000 12/28/15 0030 12/28/15 0100 12/28/15  0144  BP: 81/61 91/63 96/58  113/81  Pulse: 77 71 68 68  Temp:    98.5 F (36.9 C)  TempSrc:    Oral  Resp: 19 23 18 18   Height:    5\' 1"  (1.549 m)  Weight:    93.6 kg (206 lb 5.6 oz)  SpO2: 99% 100% 98% 100%      Constitutional: NAD, calm, comfortable Filed Vitals:   12/28/15 0000 12/28/15 0030 12/28/15 0100 12/28/15 0144  BP: 81/61 91/63 96/58  113/81  Pulse: 77 71 68 68  Temp:    98.5 F (36.9 C)  TempSrc:    Oral  Resp: 19 23 18 18   Height:    5\' 1"  (1.549 m)  Weight:    93.6 kg (206 lb 5.6 oz)  SpO2: 99% 100% 98% 100%   Eyes: PERRL, lids and conjunctivae normal ENMT: Mucous membranes are moist. Posterior  pharynx clear of any exudate or lesions.Normal dentition.  Neck: normal, supple, no masses, no thyromegaly Respiratory: clear to auscultation bilaterally, no wheezing, no crackles. Normal respiratory effort. No accessory muscle use.  Cardiovascular: Regular rate and rhythm, no murmurs / rubs / gallops. No extremity edema. 2+ pedal pulses. No carotid bruits.  Abdomen: no tenderness, no masses palpated. No hepatosplenomegaly. Bowel sounds positive.  Musculoskeletal: no clubbing / cyanosis. No joint deformity upper and lower extremities. Good ROM, no contractures. Normal muscle tone.  Skin: no rashes, lesions, ulcers. No induration Neurologic: CN 2-12 grossly intact. Sensation intact, DTR normal. Strength 5/5 in all 4.  Psychiatric: Normal judgment and insight. Alert and oriented x 3. Normal mood.    Labs on Admission: I have personally reviewed following labs and imaging studies  CBC:  Recent Labs Lab 12/27/15 2110  WBC 12.3*  HGB 12.5  HCT 37.9  MCV 87.5  PLT 0000000   Basic Metabolic Panel:  Recent Labs Lab 12/27/15 2110  NA 140  K 4.0  CL 111  CO2 24  GLUCOSE 117*  BUN 17  CREATININE 0.94  CALCIUM 8.6*   GFR: Estimated Creatinine Clearance: 73.9 mL/min (by C-G formula based on Cr of 0.94). Liver Function Tests: No results for input(s): AST, ALT, ALKPHOS, BILITOT, PROT, ALBUMIN in the last 168 hours. No results for input(s): LIPASE, AMYLASE in the last 168 hours. No results for input(s): AMMONIA in the last 168 hours. Coagulation Profile: No results for input(s): INR, PROTIME in the last 168 hours. Cardiac Enzymes:  Recent Labs Lab 12/27/15 2110 12/28/15 0236  TROPONINI 0.04* 0.05*   BNP (last 3 results) No results for input(s): PROBNP in the last 8760 hours. HbA1C: No results for input(s): HGBA1C in the last 72 hours. CBG: No results for input(s): GLUCAP in the last 168 hours. Lipid Profile: No results for input(s): CHOL, HDL, LDLCALC, TRIG, CHOLHDL,  LDLDIRECT in the last 72 hours. Thyroid Function Tests: No results for input(s): TSH, T4TOTAL, FREET4, T3FREE, THYROIDAB in the last 72 hours. Anemia Panel: No results for input(s): VITAMINB12, FOLATE, FERRITIN, TIBC, IRON, RETICCTPCT in the last 72 hours. Urine analysis:    Component Value Date/Time   COLORURINE STRAW* 08/10/2015 1930   APPEARANCEUR CLEAR 08/10/2015 1930   LABSPEC 1.015 08/10/2015 1930   PHURINE 5.5 08/10/2015 1930   GLUCOSEU NEGATIVE 08/10/2015 1930   HGBUR NEGATIVE 08/10/2015 1930   BILIRUBINUR NEGATIVE 08/10/2015 1930   KETONESUR NEGATIVE 08/10/2015 1930   PROTEINUR NEGATIVE 08/10/2015 1930   UROBILINOGEN 0.2 05/13/2009 1307   NITRITE NEGATIVE 08/10/2015 1930   LEUKOCYTESUR TRACE* 08/10/2015 1930  Sepsis Labs: !!!!!!!!!!!!!!!!!!!!!!!!!!!!!!!!!!!!!!!!!!!! @LABRCNTIP (procalcitonin:4,lacticidven:4) )No results found for this or any previous visit (from the past 240 hour(s)).   Radiological Exams on Admission: Dg Chest 2 View  12/27/2015  CLINICAL DATA:  Initial evaluation for acute palpitations. EXAM: CHEST  2 VIEW COMPARISON:  Prior radiograph from 08/10/2015. FINDINGS: Median sternotomy wires noted. Cardiomegaly is stable. Mediastinal silhouette within normal limits. Lungs normally inflated. No focal infiltrate, pulmonary edema, or pleural effusion. No pneumothorax. No acute osseus abnormality. IMPRESSION: Stable appearance of the chest. No active cardiopulmonary disease identified. Electronically Signed   By: Jeannine Boga M.D.   On: 12/27/2015 23:53    EKG: Independently reviewed. Initial svt, repeat aflutter  Assessment/Plan 50 yo female with h/o HOCM/ afib with episodes of SVT   Principal Problem:   Atrial flutter with rapid ventricular response ([place])- give extra oral load of cardizem 60mg  po tonight.  Cont to keep her outpatient appt with cards next week.  obs overnight.  Active Problems:   History of ventricular septal myectomy   Hypertrophic  obstructive cardiomyopathy (HCC)   History of atrial fibrillation   Elevated troponin   COPD (chronic obstructive pulmonary disease) (HCC)   obs on tele.   DVT prophylaxis: on xaralto Code Status: full code  Aaniya Sterba A MD [facility]  If 7PM-7AM, please contact night-coverage [email or web address] Password [facility]  12/28/2015, 3:42 AM

## 2015-12-31 ENCOUNTER — Telehealth: Payer: Self-pay | Admitting: Internal Medicine

## 2015-12-31 NOTE — Telephone Encounter (Signed)
New message     Pt would like to know if her procedure for blood clots is going to be done in the office. Please call.

## 2016-01-01 NOTE — Telephone Encounter (Addendum)
Spoke with patient and let her know that she needed to keep her follow up appointment as scheduled to discuss ablation.  She is going to do so but says her HR is still high.  Says she was told she could increase her  Metoprolol to 25 mg bid if needed. She feels she needs to do that and will keep track of her BP and HR.  She will keep follow up scheduled for 7/26

## 2016-01-06 ENCOUNTER — Encounter: Payer: Self-pay | Admitting: Internal Medicine

## 2016-01-06 ENCOUNTER — Ambulatory Visit (INDEPENDENT_AMBULATORY_CARE_PROVIDER_SITE_OTHER): Payer: Medicare Other | Admitting: Internal Medicine

## 2016-01-06 VITALS — BP 110/72 | HR 84 | Ht 61.0 in | Wt 210.4 lb

## 2016-01-06 DIAGNOSIS — I484 Atypical atrial flutter: Secondary | ICD-10-CM | POA: Diagnosis not present

## 2016-01-06 MED ORDER — METOPROLOL TARTRATE 25 MG PO TABS: 50.0000 mg | ORAL_TABLET | Freq: Two times a day (BID) | ORAL | 1 refills | Status: DC

## 2016-01-06 NOTE — Patient Instructions (Addendum)
Medication Instructions:  Your physician has recommended you make the following change in your medication:  1) Increase Metoprolol to 50mg  twice daily   Labwork: Your physician recommends that you return for lab work on 01/26/16 anytime between 7:30 and 4:30.  You do not have to fast   Testing/Procedures:   Your physician has recommended that you have an ablation. Catheter ablation is a medical procedure used to treat some cardiac arrhythmias (irregular heartbeats). During catheter ablation, a long, thin, flexible tube is put into a blood vessel in your groin (upper thigh), or neck. This tube is called an ablation catheter. It is then guided to your heart through the blood vessel. Radio frequency waves destroy small areas of heart tissue where abnormal heartbeats may cause an arrhythmia to start. Please see the instruction sheet given to you today---02/02/16   Please arrive at [facility] at 8:30am Do not eat or drink after midnight the night prior to the procedure Do not take any medications the morning of the procedure    Follow-Up: Your physician recommends that you schedule a follow-up appointment in: 4 weeks from 02/02/16 with Dr [name]   Any Other Special Instructions Will Be Listed Below (If Applicable).     If you need a refill on your cardiac medications before your next appointment, please call your pharmacy.

## 2016-01-07 DIAGNOSIS — I4891 Unspecified atrial fibrillation: Secondary | ICD-10-CM | POA: Diagnosis not present

## 2016-01-07 DIAGNOSIS — J449 Chronic obstructive pulmonary disease, unspecified: Secondary | ICD-10-CM | POA: Diagnosis not present

## 2016-01-07 DIAGNOSIS — Z1389 Encounter for screening for other disorder: Secondary | ICD-10-CM | POA: Diagnosis not present

## 2016-01-07 DIAGNOSIS — E782 Mixed hyperlipidemia: Secondary | ICD-10-CM | POA: Diagnosis not present

## 2016-01-12 NOTE — Progress Notes (Signed)
Electrophysiology Office Note   Date:  01/12/2016   ID:  [name], DOB 08-May-1966, MRN [id number]  PCP:  [name]., MD  Cardiologist:  Dr [name] Primary Electrophysiologist: [name], MD    Chief Complaint  Patient presents with  . Atrial Fibrillation     History of Present Illness: Kathleen Wright is a 50 y.o. female who presents today for electrophysiology evaluation.   The patient has atrial fibrillation and atypical atrial flutter.  She has a h/o HCM and is s/p septal myomectomy at [facility] in 2006.  She had post operative AF and was placed on amiodarone.  This was discontinued due to elevated LFTs.  She did well but has recently developed recurrent atrial arrhythmias.  She has had multiple episodes of atypical atrial flutter since 12/16 for which she has had several hospitalizations and has required cardioversions.  I did see her 06/10/15 (my note reviewed) but have not seen her since.  At that time we discussed tikosyn vs ablation but she declined both.   She reports symptoms of tachypalpitations and fatigue with her atrial arrhythmias.  She has recently returned with recurrence of atypical atrial flutter.    Today, she denies symptoms of  chest pain, shortness of breath, orthopnea, PND, lower extremity edema, claudication, dizziness, presyncope, syncope, bleeding, or neurologic sequela. The patient is tolerating medications without difficulties and is otherwise without complaint today.    Past Medical History:  Diagnosis Date  . Asthma   . Cervical cancer ([place])    a. treated with partial removal of cervix  . COPD (chronic obstructive pulmonary disease) ([place])   . Hypertrophic cardiomyopathy ([place])    a. s/p myomectomy at [facility] 08/2004  . Paroxysmal atrial flutter (HCC)    a. post-op after myomectomy in 2006. s/p TEE/DCCV.  . Transient atrial fibrillation ([place]) 2006   a. after myomectomy at [facility] in 08/2004 - was on amiodarone but developed elevated LFTs felt  possibly secondary to amiodarone    Past Surgical History:  Procedure Laterality Date  . CARDIOVERSION N/A 08/12/2015   Procedure: CARDIOVERSION;  Surgeon: Satira Sark, MD;  Location: [facility];  Service: Cardiovascular;  Laterality: N/A;  . Septal myomectomy  2006   [facility]  . TUBAL LIGATION       Current Outpatient Prescriptions  Medication Sig Dispense Refill  . ALPRAZolam (XANAX) 1 MG tablet Take 1 mg by mouth 3 (three) times daily as needed for anxiety.     Marland Kitchen diltiazem (CARDIZEM CD) 300 MG 24 hr capsule Take 1 capsule (300 mg total) by mouth daily. 30 capsule 0  . furosemide (LASIX) 20 MG tablet Take 1 tablet by mouth daily as needed. For swelling    . HYDROcodone-acetaminophen (NORCO) 10-325 MG tablet Take 1 tablet by mouth every 6 (six) hours as needed for moderate pain or severe pain.     Marland Kitchen ibuprofen (ADVIL,MOTRIN) 800 MG tablet Take 1 tablet by mouth 3 (three) times daily as needed for moderate pain.     Marland Kitchen levalbuterol (XOPENEX) 0.63 MG/3ML nebulizer solution Take 0.63 mg by nebulization every 6 (six) hours as needed for wheezing or shortness of breath.     . metoprolol tartrate (LOPRESSOR) 25 MG tablet Take 2 tablets (50 mg total) by mouth 2 (two) times daily. 120 tablet 1  . potassium chloride SA (K-DUR,KLOR-CON) 20 MEQ tablet Take 1 tablet by mouth daily as needed. When you take Lasix    . PROAIR HFA 108 (90 Base) MCG/ACT  inhaler Inhale 1-2 puffs into the lungs every 6 (six) hours as needed for wheezing or shortness of breath.     . rivaroxaban (XARELTO) 20 MG TABS tablet Take 1 tablet (20 mg total) by mouth daily with supper. 30 tablet 0   No current facility-administered medications for this visit.     Allergies:   Amiodarone   Social History:  The patient  reports that she has been smoking Cigarettes.  She has been smoking about 1.00 pack per day. She does not have any smokeless tobacco history on file. She reports that she does not drink alcohol or use drugs.    Family History:  The patient's  family history includes Heart failure in her father.    ROS:  Please see the history of present illness.   All other systems are reviewed and negative.    PHYSICAL EXAM: VS:  BP 110/72   Pulse 84   Ht 5\' 1"  (1.549 m)   Wt 210 lb 6.4 oz (95.4 kg)   SpO2 97%   BMI 39.75 kg/m  , BMI Body mass index is 39.75 kg/m. GEN: Well nourished, well developed, in no acute distress  HEENT: normal  Neck: no JVD, carotid bruits, or masses Cardiac: iRRR; no murmurs, rubs, or gallops,no edema  Respiratory:  clear to auscultation bilaterally, normal work of breathing GI: soft, nontender, nondistended, + BS MS: no deformity or atrophy  Skin: warm and dry  Neuro:  Strength and sensation are intact Psych: euthymic mood, full affect  EKG:  EKG is ordered today. The ekg ordered today shows atypical atrial flutter, V rate 84 bpm, incomplete LBBB   Recent Labs: 08/10/2015: TSH 1.570 08/12/2015: ALT 24 12/27/2015: BUN 17; Creatinine, Ser 0.94; Hemoglobin 12.5; Platelets 324; Potassium 4.0; Sodium 140    Lipid Panel  No results found for: CHOL, TRIG, HDL, CHOLHDL, VLDL, LDLCALC, LDLDIRECT   Wt Readings from Last 3 Encounters:  01/06/16 210 lb 6.4 oz (95.4 kg)  12/28/15 206 lb 5.6 oz (93.6 kg)  11/26/15 202 lb (91.6 kg)      Other studies Reviewed: Additional studies/ records that were reviewed today include: hospital records, clinic records, prior ekgs and echos    ASSESSMENT AND PLAN:  1.  Atypical atrial flutter/ atrial fibrillation The patient has symptomatic atrial arrhythmias.  Therapeutic strategies for atypical atrial flutter and afib including medicine and ablation were discussed in detail with the patient today.  I have offered tikosyn.  She is clear that she would like to avoid additional medicines.  Risk, benefits, and alternatives to EP study and radiofrequency ablation were also discussed in detail today. These risks include but are not limited to  stroke, bleeding, vascular damage, tamponade, perforation, damage to the esophagus, lungs, and other structures, pulmonary vein stenosis, worsening renal function, and death. The patient understands these risk and wishes to proceed.  We will therefore proceed with catheter ablation at the next available time.  The importance of compliance with anticoagulation was discussed with the patient.  We will schedule the procedure with Anesthesia/ [facility].  We wil uptitrated her metoprolol in the interim.  Current medicines are reviewed at length with the patient today.   The patient does not have concerns regarding her medicines.  The following changes were made today:  none  Labs/ tests ordered today include:  Orders Placed This Encounter  Procedures  . Basic metabolic panel  . CBC with Differential  . EKG 12-Lead     Signed, [name], MD  01/12/2016 9:42 PM     [place] [address] [phone] (office) [phone] (fax)

## 2016-01-26 ENCOUNTER — Other Ambulatory Visit (INDEPENDENT_AMBULATORY_CARE_PROVIDER_SITE_OTHER): Payer: Medicare Other

## 2016-01-26 DIAGNOSIS — I484 Atypical atrial flutter: Secondary | ICD-10-CM

## 2016-01-26 LAB — BASIC METABOLIC PANEL
BUN: 20 mg/dL (ref 7–25)
CHLORIDE: 105 mmol/L (ref 98–110)
CO2: 27 mmol/L (ref 20–31)
Calcium: 9.5 mg/dL (ref 8.6–10.4)
Creat: 0.93 mg/dL (ref 0.50–1.05)
GLUCOSE: 107 mg/dL — AB (ref 65–99)
POTASSIUM: 4.9 mmol/L (ref 3.5–5.3)
Sodium: 139 mmol/L (ref 135–146)

## 2016-01-26 LAB — CBC WITH DIFFERENTIAL/PLATELET
BASOS ABS: 0 {cells}/uL (ref 0–200)
Basophils Relative: 0 %
EOS ABS: 342 {cells}/uL (ref 15–500)
Eosinophils Relative: 3 %
HEMATOCRIT: 38.7 % (ref 35.0–45.0)
Hemoglobin: 12.8 g/dL (ref 11.7–15.5)
Lymphs Abs: 3420 cells/uL (ref 850–3900)
MCH: 29 pg (ref 27.0–33.0)
MCHC: 33.1 g/dL (ref 32.0–36.0)
MCV: 87.6 fL (ref 80.0–100.0)
MONO ABS: 1026 {cells}/uL — AB (ref 200–950)
MPV: 10.7 fL (ref 7.5–12.5)
Monocytes Relative: 9 %
Neutro Abs: 6612 cells/uL (ref 1500–7800)
Neutrophils Relative %: 58 %
Platelets: 362 10*3/uL (ref 140–400)
RBC: 4.42 MIL/uL (ref 3.80–5.10)
RDW: 15.8 % — ABNORMAL HIGH (ref 11.0–15.0)
WBC: 11.4 10*3/uL — ABNORMAL HIGH (ref 3.8–10.8)

## 2016-02-02 ENCOUNTER — Ambulatory Visit (HOSPITAL_COMMUNITY): Payer: Medicare Other | Admitting: Anesthesiology

## 2016-02-02 ENCOUNTER — Telehealth: Payer: Self-pay

## 2016-02-02 ENCOUNTER — Encounter (HOSPITAL_COMMUNITY): Payer: Self-pay | Admitting: Anesthesiology

## 2016-02-02 ENCOUNTER — Encounter (HOSPITAL_COMMUNITY)
Admission: RE | Disposition: A | Discharge status: Home / Self Care | Payer: Self-pay | Source: Ambulatory Visit | Attending: Internal Medicine

## 2016-02-02 ENCOUNTER — Ambulatory Visit (HOSPITAL_COMMUNITY)
Admission: RE | Admit: 2016-02-02 | Discharge: 2016-02-02 | Disposition: A | Discharge status: Home / Self Care | Payer: Medicare Other | Source: Ambulatory Visit | Attending: Internal Medicine | Admitting: Internal Medicine

## 2016-02-02 ENCOUNTER — Ambulatory Visit (HOSPITAL_BASED_OUTPATIENT_CLINIC_OR_DEPARTMENT_OTHER): Payer: Medicare Other

## 2016-02-02 DIAGNOSIS — I480 Paroxysmal atrial fibrillation: Secondary | ICD-10-CM | POA: Diagnosis not present

## 2016-02-02 DIAGNOSIS — Z8541 Personal history of malignant neoplasm of cervix uteri: Secondary | ICD-10-CM | POA: Insufficient documentation

## 2016-02-02 DIAGNOSIS — Z5309 Procedure and treatment not carried out because of other contraindication: Secondary | ICD-10-CM | POA: Diagnosis not present

## 2016-02-02 DIAGNOSIS — I340 Nonrheumatic mitral (valve) insufficiency: Secondary | ICD-10-CM

## 2016-02-02 DIAGNOSIS — I422 Other hypertrophic cardiomyopathy: Secondary | ICD-10-CM | POA: Diagnosis not present

## 2016-02-02 DIAGNOSIS — F17210 Nicotine dependence, cigarettes, uncomplicated: Secondary | ICD-10-CM | POA: Diagnosis not present

## 2016-02-02 DIAGNOSIS — I484 Atypical atrial flutter: Secondary | ICD-10-CM | POA: Diagnosis not present

## 2016-02-02 DIAGNOSIS — Z7901 Long term (current) use of anticoagulants: Secondary | ICD-10-CM | POA: Diagnosis not present

## 2016-02-02 DIAGNOSIS — J449 Chronic obstructive pulmonary disease, unspecified: Secondary | ICD-10-CM | POA: Diagnosis not present

## 2016-02-02 DIAGNOSIS — I513 Intracardiac thrombosis, not elsewhere classified: Secondary | ICD-10-CM | POA: Diagnosis not present

## 2016-02-02 SURGERY — INVASIVE LAB ABORTED CASE
Anesthesia: General

## 2016-02-02 MED ORDER — SODIUM CHLORIDE 0.9 % IV SOLN
INTRAVENOUS | Status: DC
  Administered 2016-02-02: 10:00:00 via INTRAVENOUS

## 2016-02-02 MED ORDER — LIDOCAINE HCL (CARDIAC) 20 MG/ML IV SOLN
INTRAVENOUS | Status: DC | PRN
  Administered 2016-02-02: 60 mg via INTRAVENOUS
  Administered 2016-02-02: 40 mg via INTRAVENOUS

## 2016-02-02 MED ORDER — PHENYLEPHRINE HCL 10 MG/ML IJ SOLN
INTRAMUSCULAR | Status: DC | PRN
  Administered 2016-02-02: 80 ug via INTRAVENOUS

## 2016-02-02 MED ORDER — ALBUTEROL SULFATE HFA 108 (90 BASE) MCG/ACT IN AERS
INHALATION_SPRAY | RESPIRATORY_TRACT | Status: DC | PRN
  Administered 2016-02-02 (×2): 4 via RESPIRATORY_TRACT

## 2016-02-02 MED ORDER — IOPAMIDOL (ISOVUE-370) INJECTION 76%
INTRAVENOUS | Status: AC
  Filled 2016-02-02: qty 125

## 2016-02-02 MED ORDER — FENTANYL CITRATE (PF) 100 MCG/2ML IJ SOLN
INTRAMUSCULAR | Status: DC | PRN
  Administered 2016-02-02: 100 ug via INTRAVENOUS

## 2016-02-02 MED ORDER — IOPAMIDOL (ISOVUE-370) INJECTION 76%
INTRAVENOUS | Status: AC
  Filled 2016-02-02: qty 50

## 2016-02-02 MED ORDER — ONDANSETRON HCL 4 MG/2ML IJ SOLN
INTRAMUSCULAR | Status: DC | PRN
Start: 2016-02-02 — End: 2016-02-02
  Administered 2016-02-02: 4 mg via INTRAVENOUS

## 2016-02-02 MED ORDER — SUGAMMADEX SODIUM 200 MG/2ML IV SOLN
INTRAVENOUS | Status: DC | PRN
  Administered 2016-02-02: 200 mg via INTRAVENOUS

## 2016-02-02 MED ORDER — HEPARIN SODIUM (PORCINE) 1000 UNIT/ML IJ SOLN
INTRAMUSCULAR | Status: AC
  Filled 2016-02-02: qty 1

## 2016-02-02 MED ORDER — DABIGATRAN ETEXILATE MESYLATE 150 MG PO CAPS: 150.0000 mg | ORAL_CAPSULE | Freq: Two times a day (BID) | ORAL | 2 refills | Status: DC

## 2016-02-02 MED ORDER — PROPOFOL 10 MG/ML IV BOLUS
INTRAVENOUS | Status: DC | PRN
  Administered 2016-02-02: 160 mg via INTRAVENOUS

## 2016-02-02 MED ORDER — BUPIVACAINE HCL (PF) 0.25 % IJ SOLN
INTRAMUSCULAR | Status: AC
  Filled 2016-02-02: qty 30

## 2016-02-02 MED ORDER — DEXAMETHASONE SODIUM PHOSPHATE 10 MG/ML IJ SOLN
INTRAMUSCULAR | Status: DC | PRN
  Administered 2016-02-02: 10 mg via INTRAVENOUS

## 2016-02-02 MED ORDER — LIDOCAINE HCL 4 % MT SOLN
OROMUCOSAL | Status: DC | PRN
  Administered 2016-02-02: 4 mL via TOPICAL

## 2016-02-02 MED ORDER — ROCURONIUM BROMIDE 100 MG/10ML IV SOLN
INTRAVENOUS | Status: DC | PRN
  Administered 2016-02-02: 50 mg via INTRAVENOUS

## 2016-02-02 MED ORDER — ARTIFICIAL TEARS OP OINT
TOPICAL_OINTMENT | OPHTHALMIC | Status: DC | PRN
  Administered 2016-02-02: 1 via OPHTHALMIC

## 2016-02-02 SURGICAL SUPPLY — 4 items
BLANKET WARM UNDERBOD FULL ACC (MISCELLANEOUS) IMPLANT
PAD DEFIB LIFELINK (PAD) IMPLANT
PATCH CARTO3 (PAD) IMPLANT
SYR MEDRAD MARK V 150ML (SYRINGE) IMPLANT

## 2016-02-02 NOTE — Progress Notes (Addendum)
Patient arrived to cath lab holding for recovery.  Temp taken 97.7 oral.  Patient awake, alert and oriented, pain free.  Vitals stable. Patient placed on [place] 2L. Patient wheezing, has been given albuterol post extubation by CRNA, baseline respiratory status. Patient has a productive cough as well. Will continue to monitor.

## 2016-02-02 NOTE — Anesthesia Preprocedure Evaluation (Signed)
Anesthesia Evaluation  Patient identified by MRN, date of birth, ID band Patient awake    Reviewed: Allergy & Precautions, H&P , NPO status , Patient's Chart, lab work & pertinent test results  Airway Mallampati: II   Neck ROM: full    Dental   Pulmonary asthma , COPD, Current Smoker,    breath sounds clear to auscultation       Cardiovascular + dysrhythmias Atrial Fibrillation  Rhythm:irregular Rate:Normal  HOCM. S/p myomectomy 2006.   Neuro/Psych    GI/Hepatic   Endo/Other    Renal/GU      Musculoskeletal  (+) Arthritis ,   Abdominal   Peds  Hematology   Anesthesia Other Findings   Reproductive/Obstetrics                             Anesthesia Physical Anesthesia Plan  ASA: III  Anesthesia Plan: General   Post-op Pain Management:    Induction: Intravenous  Airway Management Planned: LMA  Additional Equipment:   Intra-op Plan:   Post-operative Plan:   Informed Consent: I have reviewed the patients History and Physical, chart, labs and discussed the procedure including the risks, benefits and alternatives for the proposed anesthesia with the patient or authorized representative who has indicated his/her understanding and acceptance.     Plan Discussed with: CRNA, Anesthesiologist and Surgeon  Anesthesia Plan Comments:         Anesthesia Quick Evaluation

## 2016-02-02 NOTE — Telephone Encounter (Signed)
Prior auth obtained for Pradaxa 150mg  through [facility]. PA- [id number]. Local pharmacy notified.

## 2016-02-02 NOTE — Progress Notes (Signed)
Pt presents for atypical atrial flutter ablation. In sinus rhythm today. TEE performed in the EP lab reveals left atrial appendage thrombus. Ablation therefore not performed today.  Will continue anticoagulation and reassess in the future.  [name] MD, [facility] 02/02/2016 12:27 PM

## 2016-02-02 NOTE — H&P (View-Only) (Signed)
Electrophysiology Office Note   Date:  01/12/2016   ID:  Kathleen Wright, DOB [birth year], MRN [id number]  PCP:  [name]., MD  Cardiologist:  Dr [name] Primary Electrophysiologist: [name], MD    Chief Complaint  Patient presents with  . Atrial Fibrillation     History of Present Illness: Kathleen Wright is a 50 y.o. female who presents today for electrophysiology evaluation.   The patient has atrial fibrillation and atypical atrial flutter.  She has a h/o HCM and is s/p septal myomectomy at [facility] in 2006.  She had post operative AF and was placed on amiodarone.  This was discontinued due to elevated LFTs.  She did well but has recently developed recurrent atrial arrhythmias.  She has had multiple episodes of atypical atrial flutter since 12/16 for which she has had several hospitalizations and has required cardioversions.  I did see her 06/10/15 (my note reviewed) but have not seen her since.  At that time we discussed tikosyn vs ablation but she declined both.   She reports symptoms of tachypalpitations and fatigue with her atrial arrhythmias.  She has recently returned with recurrence of atypical atrial flutter.    Today, she denies symptoms of  chest pain, shortness of breath, orthopnea, PND, lower extremity edema, claudication, dizziness, presyncope, syncope, bleeding, or neurologic sequela. The patient is tolerating medications without difficulties and is otherwise without complaint today.    Past Medical History:  Diagnosis Date  . Asthma   . Cervical cancer ([place])    a. treated with partial removal of cervix  . COPD (chronic obstructive pulmonary disease) ([place])   . Hypertrophic cardiomyopathy ([place])    a. s/p myomectomy at [facility] 08/2004  . Paroxysmal atrial flutter ([facility])    a. post-op after myomectomy in 2006. s/p TEE/DCCV.  . Transient atrial fibrillation ([place]) 2006   a. after myomectomy at [facility] in 08/2004 - was on amiodarone but developed elevated LFTs felt  possibly secondary to amiodarone    Past Surgical History:  Procedure Laterality Date  . CARDIOVERSION N/A 08/12/2015   Procedure: CARDIOVERSION;  Surgeon: Satira Sark, MD;  [facility];  Service: Cardiovascular;  Laterality: N/A;  . Septal myomectomy  2006   [facility]  . TUBAL LIGATION       Current Outpatient Prescriptions  Medication Sig Dispense Refill  . ALPRAZolam (XANAX) 1 MG tablet Take 1 mg by mouth 3 (three) times daily as needed for anxiety.     Marland Kitchen diltiazem (CARDIZEM CD) 300 MG 24 hr capsule Take 1 capsule (300 mg total) by mouth daily. 30 capsule 0  . furosemide (LASIX) 20 MG tablet Take 1 tablet by mouth daily as needed. For swelling    . HYDROcodone-acetaminophen (NORCO) 10-325 MG tablet Take 1 tablet by mouth every 6 (six) hours as needed for moderate pain or severe pain.     Marland Kitchen ibuprofen (ADVIL,MOTRIN) 800 MG tablet Take 1 tablet by mouth 3 (three) times daily as needed for moderate pain.     Marland Kitchen levalbuterol (XOPENEX) 0.63 MG/3ML nebulizer solution Take 0.63 mg by nebulization every 6 (six) hours as needed for wheezing or shortness of breath.     . metoprolol tartrate (LOPRESSOR) 25 MG tablet Take 2 tablets (50 mg total) by mouth 2 (two) times daily. 120 tablet 1  . potassium chloride SA (K-DUR,KLOR-CON) 20 MEQ tablet Take 1 tablet by mouth daily as needed. When you take Lasix    . PROAIR HFA 108 (90 Base) MCG/ACT  inhaler Inhale 1-2 puffs into the lungs every 6 (six) hours as needed for wheezing or shortness of breath.     . rivaroxaban (XARELTO) 20 MG TABS tablet Take 1 tablet (20 mg total) by mouth daily with supper. 30 tablet 0   No current facility-administered medications for this visit.     Allergies:   Amiodarone   Social History:  The patient  reports that she has been smoking Cigarettes.  She has been smoking about 1.00 pack per day. She does not have any smokeless tobacco history on file. She reports that she does not drink alcohol or use drugs.    Family History:  The patient's  family history includes Heart failure in her father.    ROS:  Please see the history of present illness.   All other systems are reviewed and negative.    PHYSICAL EXAM: VS:  BP 110/72   Pulse 84   Ht 5\' 1"  (1.549 m)   Wt 210 lb 6.4 oz (95.4 kg)   SpO2 97%   BMI 39.75 kg/m  , BMI Body mass index is 39.75 kg/m. GEN: Well nourished, well developed, in no acute distress  HEENT: normal  Neck: no JVD, carotid bruits, or masses Cardiac: iRRR; no murmurs, rubs, or gallops,no edema  Respiratory:  clear to auscultation bilaterally, normal work of breathing GI: soft, nontender, nondistended, + BS MS: no deformity or atrophy  Skin: warm and dry  Neuro:  Strength and sensation are intact Psych: euthymic mood, full affect  EKG:  EKG is ordered today. The ekg ordered today shows atypical atrial flutter, V rate 84 bpm, incomplete LBBB   Recent Labs: 08/10/2015: TSH 1.570 08/12/2015: ALT 24 12/27/2015: BUN 17; Creatinine, Ser 0.94; Hemoglobin 12.5; Platelets 324; Potassium 4.0; Sodium 140    Lipid Panel  No results found for: CHOL, TRIG, HDL, CHOLHDL, VLDL, LDLCALC, LDLDIRECT   Wt Readings from Last 3 Encounters:  01/06/16 210 lb 6.4 oz (95.4 kg)  12/28/15 206 lb 5.6 oz (93.6 kg)  11/26/15 202 lb (91.6 kg)      Other studies Reviewed: Additional studies/ records that were reviewed today include: hospital records, clinic records, prior ekgs and echos    ASSESSMENT AND PLAN:  1.  Atypical atrial flutter/ atrial fibrillation The patient has symptomatic atrial arrhythmias.  Therapeutic strategies for atypical atrial flutter and afib including medicine and ablation were discussed in detail with the patient today.  I have offered tikosyn.  She is clear that she would like to avoid additional medicines.  Risk, benefits, and alternatives to EP study and radiofrequency ablation were also discussed in detail today. These risks include but are not limited to  stroke, bleeding, vascular damage, tamponade, perforation, damage to the esophagus, lungs, and other structures, pulmonary vein stenosis, worsening renal function, and death. The patient understands these risk and wishes to proceed.  We will therefore proceed with catheter ablation at the next available time.  The importance of compliance with anticoagulation was discussed with the patient.  We will schedule the procedure with Anesthesia/ [place].  We wil uptitrated her metoprolol in the interim.  Current medicines are reviewed at length with the patient today.   The patient does not have concerns regarding her medicines.  The following changes were made today:  none  Labs/ tests ordered today include:  Orders Placed This Encounter  Procedures  . Basic metabolic panel  . CBC with Differential  . EKG 12-Lead     Signed, [name], MD  01/12/2016 9:42 PM     [place] [address] [phone] (office) [phone] (fax)

## 2016-02-02 NOTE — Progress Notes (Signed)
  Echocardiogram 2D Echocardiogram has been performed.  Donata Clay 02/02/2016, 12:26 PM

## 2016-02-02 NOTE — Anesthesia Procedure Notes (Addendum)
Procedure Name: Intubation Date/Time: 02/02/2016 11:48 AM Performed by: Jacquiline Doe A Pre-anesthesia Checklist: Patient identified, Emergency Drugs available, Suction available and Patient being monitored Patient Re-evaluated:Patient Re-evaluated prior to inductionOxygen Delivery Method: Circle System Utilized Preoxygenation: Pre-oxygenation with 100% oxygen Intubation Type: IV induction and Cricoid Pressure applied Ventilation: Mask ventilation without difficulty Laryngoscope Size: Mac and 4 Grade View: Grade I Tube type: Oral Tube size: 7.0 mm Number of attempts: 1 Airway Equipment and Method: Stylet,  Oral airway and LTA kit utilized Placement Confirmation: ETT inserted through vocal cords under direct vision,  positive ETCO2 and breath sounds checked- equal and bilateral Secured at: 21 cm Tube secured with: Tape Dental Injury: Teeth and Oropharynx as per pre-operative assessment

## 2016-02-02 NOTE — Transfer of Care (Signed)
Immediate Anesthesia Transfer of Care Note  Patient: Kathleen Wright  Procedure(s) Performed: Procedure(s): INVASIVE LAB ABORTED/CANCEL CASE  Patient Location: PACU and Cath Lab  Anesthesia Type:General  Level of Consciousness: awake, oriented, sedated, patient cooperative and responds to stimulation  Airway & Oxygen Therapy: Patient Spontanous Breathing and Patient connected to face mask oxygen  Post-op Assessment: Report given to RN, Post -op Vital signs reviewed and stable, Patient moving all extremities and Patient moving all extremities X 4  Post vital signs: Reviewed and stable  Last Vitals:  Vitals:   02/02/16 0755  BP: 128/73  Pulse: 60  Resp: 18  Temp: 36.4 C    Last Pain:  Vitals:   02/02/16 0755  TempSrc: Oral         Complications: No apparent anesthesia complications

## 2016-02-02 NOTE — Interval H&P Note (Signed)
History and Physical Interval Note:  02/02/2016 11:04 AM  Kathleen Wright  has presented today for surgery, with the diagnosis of atypical flutter  The various methods of treatment have been discussed with the patient and family. After consideration of risks, benefits and other options for treatment, the patient has consented to  Procedure(s): Atypical Flutter Ablation (N/A) and atrial  Fibrillation ablation as a surgical intervention .  The patient's history has been reviewed, patient examined, no change in status, stable for surgery.  I have reviewed the patient's chart and labs.  Questions were answered to the patient's satisfaction.     [name]

## 2016-02-02 NOTE — Progress Notes (Signed)
Dr [name] at bedside in recovery.

## 2016-02-03 NOTE — Anesthesia Postprocedure Evaluation (Signed)
Anesthesia Post Note  Patient: Kathleen Wright  Procedure(s) Performed: Procedure(s): INVASIVE LAB ABORTED/CANCEL CASE  Patient location during evaluation: PACU Anesthesia Type: General Level of consciousness: awake and alert and patient cooperative Pain management: pain level controlled Vital Signs Assessment: post-procedure vital signs reviewed and stable Respiratory status: spontaneous breathing and respiratory function stable Cardiovascular status: stable Anesthetic complications: no    Last Vitals:  Vitals:   02/02/16 1400 02/02/16 1415  BP:  112/74  Pulse: (!) 58 (!) 53  Resp: 16 16  Temp: 36.5 C 36.7 C    Last Pain:  Vitals:   02/02/16 1400  TempSrc: Oral                 Genessa Beman S

## 2016-02-08 ENCOUNTER — Other Ambulatory Visit: Payer: Self-pay | Admitting: Internal Medicine

## 2016-03-02 ENCOUNTER — Ambulatory Visit (INDEPENDENT_AMBULATORY_CARE_PROVIDER_SITE_OTHER): Payer: Medicare Other | Admitting: Internal Medicine

## 2016-03-02 ENCOUNTER — Encounter: Payer: Self-pay | Admitting: Internal Medicine

## 2016-03-02 VITALS — BP 112/74 | HR 88 | Ht 61.0 in | Wt 210.0 lb

## 2016-03-02 DIAGNOSIS — I481 Persistent atrial fibrillation: Secondary | ICD-10-CM

## 2016-03-02 DIAGNOSIS — I480 Paroxysmal atrial fibrillation: Secondary | ICD-10-CM | POA: Diagnosis not present

## 2016-03-02 DIAGNOSIS — I4819 Other persistent atrial fibrillation: Secondary | ICD-10-CM

## 2016-03-02 MED ORDER — DABIGATRAN ETEXILATE MESYLATE 150 MG PO CAPS: 150.0000 mg | ORAL_CAPSULE | Freq: Two times a day (BID) | ORAL | 3 refills | Status: DC

## 2016-03-02 NOTE — Patient Instructions (Signed)
Medication Instructions:  Your physician recommends that you continue on your current medications as directed. Please refer to the Current Medication list given to you today.   Labwork: Your physician recommends that you return for lab work on  03/22/16 at 1pm---you do not have to fast   Testing/Procedures: Your physician has requested that you have a TEE. During a TEE, sound waves are used to create images of your heart. It provides your doctor with information about the size and shape of your heart and how well your heart's chambers and valves are working. In this test, a transducer is attached to the end of a flexible tube that's guided down your throat and into your esophagus (the tube leading from you mouth to your stomach) to get a more detailed image of your heart. You are not awake for the procedure. Please see the instruction sheet given to you today. For further information please visit [email or web address]  Your physician has recommended that you have an ablation. Catheter ablation is a medical procedure used to treat some cardiac arrhythmias (irregular heartbeats). During catheter ablation, a long, thin, flexible tube is put into a blood vessel in your groin (upper thigh), or neck. This tube is called an ablation catheter. It is then guided to your heart through the blood vessel. Radio frequency waves destroy small areas of heart tissue where abnormal heartbeats may cause an arrhythmia to start. Please see the instruction sheet given to you today.---03/29/16  Please arrive at [facility] at [facility] not eat or drink after midnight night the night prior to the procedure Do not take any medication the morning of the procedure Plan for one night stay        Follow-Up: Your physician recommends that you schedule a follow-up appointment in: 4 weeks from 03/29/16 with Roderic Palau, NP in the afib clinic and 3 months with Dr  [name]   Any Other Special Instructions Will Be Listed Below (If Applicable).     If you need a refill on your cardiac medications before your next appointment, please call your pharmacy.

## 2016-03-02 NOTE — Progress Notes (Signed)
Electrophysiology Office Note   Date:  03/02/2016   ID:  Kathleen Wright, DOB May 22, 1966, MRN [id number]  PCP:  [name]., MD  Cardiologist:  Dr [name] Primary Electrophysiologist: [name], MD    Chief Complaint  Patient presents with  . Atrial Flutter     History of Present Illness: Kathleen Wright is a 50 y.o. female who presents today for electrophysiology evaluation.   The patient has atrial fibrillation and atypical atrial flutter.  She has a h/o HCM and is s/p septal myomectomy at [facility] in 2006.  She had post operative AF and was placed on amiodarone.  This was discontinued due to elevated LFTs.  She did well but has recently developed recurrent atrial arrhythmias.  She has had multiple episodes of atypical atrial flutter since 12/16 for which she has had several hospitalizations and has required cardioversions.  I did see her 06/10/15 (my note reviewed) but have not seen her since.  At that time we discussed tikosyn vs ablation but she declined both.  She recently presented for ablation but was found to have LAA thrombus on xarelto.  She was switched to pradaxa.  She reports compliance.   She is in afib today.  She reports fatigue and decreased exercise tolerance.  Today, she denies symptoms of  chest pain, shortness of breath, orthopnea, PND, lower extremity edema, claudication, dizziness, presyncope, syncope, bleeding, or neurologic sequela. The patient is tolerating medications without difficulties and is otherwise without complaint today.    Past Medical History:  Diagnosis Date  . Asthma   . Cervical cancer ([place])    a. treated with partial removal of cervix  . COPD (chronic obstructive pulmonary disease) ([place])   . Hypertrophic cardiomyopathy ([place])    a. s/p myomectomy at [facility] 08/2004  . Paroxysmal atrial flutter ([facility])    a. post-op after myomectomy in 2006. s/p TEE/DCCV.  . Transient atrial fibrillation ([place]) 2006   a. after myomectomy at [facility] in 08/2004 -  was on amiodarone but developed elevated LFTs felt possibly secondary to amiodarone    Past Surgical History:  Procedure Laterality Date  . CARDIOVERSION N/A 08/12/2015   Procedure: CARDIOVERSION;  Surgeon: Satira Sark, MD;  Location: [facility];  Service: Cardiovascular;  Laterality: N/A;  . Septal myomectomy  2006   [facility]  . TUBAL LIGATION       Current Outpatient Prescriptions  Medication Sig Dispense Refill  . ALPRAZolam (XANAX) 1 MG tablet Take 1 mg by mouth 3 (three) times daily as needed for anxiety.     . dabigatran (PRADAXA) 150 MG CAPS capsule Take 1 capsule (150 mg total) by mouth 2 (two) times daily. 60 capsule 2  . diltiazem (CARDIZEM CD) 300 MG 24 hr capsule Take 1 capsule (300 mg total) by mouth daily. 30 capsule 0  . furosemide (LASIX) 20 MG tablet Take 20-60 mg by mouth daily as needed for fluid. Takes potassium with furosemide.    Marland Kitchen HYDROcodone-acetaminophen (NORCO) 10-325 MG tablet Take 1 tablet by mouth every 6 (six) hours as needed for moderate pain or severe pain.     Marland Kitchen ibuprofen (ADVIL,MOTRIN) 800 MG tablet Take 800-1,600 mg by mouth 3 (three) times daily as needed for moderate pain.     Marland Kitchen levalbuterol (XOPENEX) 0.63 MG/3ML nebulizer solution Take 0.63 mg by nebulization every 6 (six) hours as needed for wheezing or shortness of breath.     . metoprolol tartrate (LOPRESSOR) 25 MG tablet TAKE 2 TABLETS BY MOUTH  TWICE DAILY. 120 tablet 10  . potassium chloride SA (K-DUR,KLOR-CON) 20 MEQ tablet Take 1 tablet by mouth daily as needed. When you take Lasix    . PROAIR HFA 108 (90 Base) MCG/ACT inhaler Inhale 1-2 puffs into the lungs every 6 (six) hours as needed for wheezing or shortness of breath.      No current facility-administered medications for this visit.     Allergies:   Amiodarone   Social History:  The patient  reports that she has been smoking Cigarettes.  She has been smoking about 1.00 pack per day. She does not have any smokeless tobacco history on  file. She reports that she does not drink alcohol or use drugs.   Family History:  The patient's  family history includes Heart failure in her father.    ROS:  Please see the history of present illness.   All other systems are reviewed and negative.    PHYSICAL EXAM: VS:  BP 112/74   Pulse 88   Ht 5\' 1"  (1.549 m)   Wt 210 lb (95.3 kg)   BMI 39.68 kg/m  , BMI Body mass index is 39.68 kg/m. GEN: Well nourished, well developed, in no acute distress  HEENT: normal  Neck: no JVD, carotid bruits, or masses Cardiac: iRRR; no murmurs, rubs, or gallops,no edema  Respiratory:  clear to auscultation bilaterally, normal work of breathing GI: soft, nontender, nondistended, + BS MS: no deformity or atrophy  Skin: warm and dry  Neuro:  Strength and sensation are intact Psych: euthymic mood, full affect  EKG:  EKG is ordered today. The ekg ordered today shows afib 88 bpm, LVH, nonspecific St/T changes   Recent Labs: 08/10/2015: TSH 1.570 08/12/2015: ALT 24 01/26/2016: BUN 20; Creat 0.93; Hemoglobin 12.8; Platelets 362; Potassium 4.9; Sodium 139    Lipid Panel  No results found for: CHOL, TRIG, HDL, CHOLHDL, VLDL, LDLCALC, LDLDIRECT   Wt Readings from Last 3 Encounters:  03/02/16 210 lb (95.3 kg)  02/02/16 205 lb (93 kg)  01/06/16 210 lb 6.4 oz (95.4 kg)      Other studies Reviewed: Additional studies/ records that were reviewed today include: hospital records, clinic records, prior ekgs and echos    ASSESSMENT AND PLAN:  1.  Atypical atrial flutter/ atrial fibrillation The patient has symptomatic atrial arrhythmias.  Therapeutic strategies for atypical atrial flutter and afib including medicine and ablation were discussed in detail with the patient today.  I have offered tikosyn.  She is clear that she would like to avoid additional medicines.  Risk, benefits, and alternatives to EP study and radiofrequency ablation were also discussed in detail today. These risks include but are  not limited to stroke, bleeding, vascular damage, tamponade, perforation, damage to the esophagus, lungs, and other structures, pulmonary vein stenosis, worsening renal function, and death. The patient understands these risk and wishes to proceed.  Will need TEE prior to ablation to make sure LAA thrombus is gone. Importance of compliance with anticoagulation was stressed today. I worry that our ability to control her arrhythmias long term is low.  I have discussed this with her today.  I have also told her that given LAA thrombus previously that she will require lifelong anticoagulation.  She has blisters on her hands and feet which she attributes to anticoagulation (both xarelto and pradaxa).  I have encouraged her to see dermatology regarding this.  Current medicines are reviewed at length with the patient today.   The patient does not have  concerns regarding her medicines.  The following changes were made today:  none    Signed, [name], MD  03/02/2016 3:16 PM     [place] [address] [phone] (office) [phone] (fax)

## 2016-03-02 NOTE — Progress Notes (Deleted)
Cardiology Office Note  Date: 03/02/2016   ID: RITA PARCHMENT, DOB [birth year], MRN [id number]  PCP: [name]., MD  Primary Cardiologist: [name], MD   No chief complaint on file.   History of Present Illness: Kathleen Wright is a 50 y.o. female last seen in June. I referred her back to see Dr. [name] for discussion of antiarrhythmic therapy versus ablation for recurrent atrial arrhythmias. In reviewing the records I see that ablation was pursued however preprocedure TEE showed a left atrial appendage thrombus with spontaneous contrast and low emptying velocity.  Past Medical History:  Diagnosis Date  . Asthma   . Cervical cancer ([place])    a. treated with partial removal of cervix  . COPD (chronic obstructive pulmonary disease) ([place])   . Hypertrophic cardiomyopathy ([place])    a. s/p myomectomy at [facility] 08/2004  . Paroxysmal atrial flutter ([facility])    a. post-op after myomectomy in 2006. s/p TEE/DCCV.  . Transient atrial fibrillation ([place]) 2006   a. after myomectomy at [facility] in 08/2004 - was on amiodarone but developed elevated LFTs felt possibly secondary to amiodarone     Past Surgical History:  Procedure Laterality Date  . CARDIOVERSION N/A 08/12/2015   Procedure: CARDIOVERSION;  Surgeon: Satira Sark, MD;  Location: [facility];  Service: Cardiovascular;  Laterality: N/A;  . Septal myomectomy  2006   [facility]  . TUBAL LIGATION      Current Outpatient Prescriptions  Medication Sig Dispense Refill  . ALPRAZolam (XANAX) 1 MG tablet Take 1 mg by mouth 3 (three) times daily as needed for anxiety.     . dabigatran (PRADAXA) 150 MG CAPS capsule Take 1 capsule (150 mg total) by mouth 2 (two) times daily. 60 capsule 2  . diltiazem (CARDIZEM CD) 300 MG 24 hr capsule Take 1 capsule (300 mg total) by mouth daily. 30 capsule 0  . furosemide (LASIX) 20 MG tablet Take 20-60 mg by mouth daily as needed for fluid. Takes potassium with furosemide.    Marland Kitchen HYDROcodone-acetaminophen  (NORCO) 10-325 MG tablet Take 1 tablet by mouth every 6 (six) hours as needed for moderate pain or severe pain.     Marland Kitchen ibuprofen (ADVIL,MOTRIN) 800 MG tablet Take 800-1,600 mg by mouth 3 (three) times daily as needed for moderate pain.     Marland Kitchen levalbuterol (XOPENEX) 0.63 MG/3ML nebulizer solution Take 0.63 mg by nebulization every 6 (six) hours as needed for wheezing or shortness of breath.     . metoprolol tartrate (LOPRESSOR) 25 MG tablet TAKE 2 TABLETS BY MOUTH TWICE DAILY. 120 tablet 10  . potassium chloride SA (K-DUR,KLOR-CON) 20 MEQ tablet Take 1 tablet by mouth daily as needed. When you take Lasix    . PROAIR HFA 108 (90 Base) MCG/ACT inhaler Inhale 1-2 puffs into the lungs every 6 (six) hours as needed for wheezing or shortness of breath.      No current facility-administered medications for this visit.    Allergies:  Amiodarone   Social History: The patient  reports that she has been smoking Cigarettes.  She has been smoking about 1.00 pack per day. She does not have any smokeless tobacco history on file. She reports that she does not drink alcohol or use drugs.   Family History: The patient's family history includes Heart failure in her father.   ROS:  Please see the history of present illness. Otherwise, complete review of systems is positive for {NONE DEFAULTED:18576::"none"}.  All other systems are reviewed  and negative.   Physical Exam: VS:  There were no vitals taken for this visit., BMI There is no height or weight on file to calculate BMI.  Wt Readings from Last 3 Encounters:  02/02/16 205 lb (93 kg)  01/06/16 210 lb 6.4 oz (95.4 kg)  12/28/15 206 lb 5.6 oz (93.6 kg)    General: Overweight woman, appears comfortable at rest. HEENT: Conjunctiva and lids normal, oropharynx clear. Neck: Supple, no elevated JVP or carotid bruits, no thyromegaly. Lungs: Clear to auscultation, nonlabored breathing at rest. Thorax: Well-healed sternal incision. Cardiac: Regular rate and rhythm,  no S3 or significant systolic murmur, no pericardial rub. Abdomen: Soft, nontender, bowel sounds present. Extremities: No pitting edema, distal pulses 2+. Skin: Warm and dry. Musculoskeletal: No kyphosis. Neuropsychiatric: Alert and oriented x3, affect grossly appropriate.  ECG: I personally reviewed the tracing from 01/06/2016 which showed atypical atrial flutter with variable conduction, IVCD.  Recent Labwork: 08/10/2015: TSH 1.570 08/12/2015: ALT 24; AST 27 01/26/2016: BUN 20; Creat 0.93; Hemoglobin 12.8; Platelets 362; Potassium 4.9; Sodium 139   Other Studies Reviewed Today:  Echocardiogram 06/10/2015: Study Conclusions  - Left ventricle: Post septal myectomy with scarring mid and apical septum moderately thickened No SAM or LVOT gradient. The cavity size was normal. Wall thickness was increased in a pattern of moderate LVH. Systolic function was normal. The estimated ejection fraction was in the range of 60% to 65%. - Mitral valve: Calcified annulus. There was mild regurgitation. - Left atrium: The atrium was moderately dilated. - Atrial septum: No defect or patent foramen ovale was identified.  TEE 02/02/2016: Study Conclusions  - Left ventricle: Hypertrophy was noted. Systolic function was   normal. The estimated ejection fraction was in the range of 55%   to 60%. - Aortic valve: No evidence of vegetation. - Mitral valve: There was mild systolic anterior motion of the   chordal structures. No evidence of vegetation. There was mild   regurgitation. - Left atrium: The atrium was severely dilated. There was a   thrombus. - Right atrium: No evidence of thrombus in the atrial cavity or   appendage. - Atrial septum: No defect or patent foramen ovale was identified. - Tricuspid valve: No evidence of vegetation. - Pulmonic valve: No evidence of vegetation.  Impressions:  - Normal LV systolic function; s/p myectomy; small inferior basal   aneurysm; severe LAE  with spontaneous contrast and thrombus in   left atrial appendage; decreased emptying velocity of 16 cm/sec;   mildly thickened MV with mild MR; mild TR.  Assessment and Plan:   Current medicines were reviewed with the patient today.  No orders of the defined types were placed in this encounter.   Disposition:  Signed, Satira Sark, MD, [facility] 03/02/2016 2:31 PM    [place] at [facility] [address], [place], [place] [address] Phone: [phone]; Fax: [phone]

## 2016-03-03 ENCOUNTER — Ambulatory Visit: Payer: Medicare Other | Admitting: Cardiology

## 2016-03-22 ENCOUNTER — Other Ambulatory Visit: Payer: Medicare Other | Admitting: *Deleted

## 2016-03-22 DIAGNOSIS — I4819 Other persistent atrial fibrillation: Secondary | ICD-10-CM

## 2016-03-22 DIAGNOSIS — I481 Persistent atrial fibrillation: Secondary | ICD-10-CM | POA: Diagnosis not present

## 2016-03-22 LAB — CBC WITH DIFFERENTIAL/PLATELET
BASOS ABS: 113 {cells}/uL (ref 0–200)
Basophils Relative: 1 %
EOS ABS: 226 {cells}/uL (ref 15–500)
Eosinophils Relative: 2 %
HCT: 41.6 % (ref 35.0–45.0)
Hemoglobin: 13.4 g/dL (ref 11.7–15.5)
LYMPHS PCT: 31 %
Lymphs Abs: 3503 cells/uL (ref 850–3900)
MCH: 28.6 pg (ref 27.0–33.0)
MCHC: 32.2 g/dL (ref 32.0–36.0)
MCV: 88.7 fL (ref 80.0–100.0)
MONOS PCT: 7 %
MPV: 10.6 fL (ref 7.5–12.5)
Monocytes Absolute: 791 cells/uL (ref 200–950)
NEUTROS ABS: 6667 {cells}/uL (ref 1500–7800)
Neutrophils Relative %: 59 %
PLATELETS: 351 10*3/uL (ref 140–400)
RBC: 4.69 MIL/uL (ref 3.80–5.10)
RDW: 15.2 % — ABNORMAL HIGH (ref 11.0–15.0)
WBC: 11.3 10*3/uL — ABNORMAL HIGH (ref 3.8–10.8)

## 2016-03-22 LAB — BASIC METABOLIC PANEL
BUN: 23 mg/dL (ref 7–25)
CHLORIDE: 105 mmol/L (ref 98–110)
CO2: 22 mmol/L (ref 20–31)
CREATININE: 1.17 mg/dL — AB (ref 0.50–1.05)
Calcium: 9.1 mg/dL (ref 8.6–10.4)
GLUCOSE: 107 mg/dL — AB (ref 65–99)
Potassium: 4.7 mmol/L (ref 3.5–5.3)
Sodium: 137 mmol/L (ref 135–146)

## 2016-03-29 ENCOUNTER — Encounter (HOSPITAL_COMMUNITY): Payer: Self-pay | Admitting: Anesthesiology

## 2016-03-29 ENCOUNTER — Encounter (HOSPITAL_COMMUNITY): Payer: Self-pay | Admitting: *Deleted

## 2016-03-29 ENCOUNTER — Encounter (HOSPITAL_COMMUNITY)
Admission: RE | Disposition: A | Discharge status: Home / Self Care | Payer: Self-pay | Source: Ambulatory Visit | Attending: Internal Medicine

## 2016-03-29 ENCOUNTER — Other Ambulatory Visit: Payer: Self-pay | Admitting: *Deleted

## 2016-03-29 ENCOUNTER — Ambulatory Visit (HOSPITAL_BASED_OUTPATIENT_CLINIC_OR_DEPARTMENT_OTHER): Payer: Medicare Other

## 2016-03-29 ENCOUNTER — Ambulatory Visit (HOSPITAL_COMMUNITY)
Admission: RE | Admit: 2016-03-29 | Discharge: 2016-03-29 | Disposition: A | Discharge status: Home / Self Care | Payer: Medicare Other | Source: Ambulatory Visit | Attending: Internal Medicine | Admitting: Internal Medicine

## 2016-03-29 DIAGNOSIS — Z7901 Long term (current) use of anticoagulants: Secondary | ICD-10-CM | POA: Diagnosis not present

## 2016-03-29 DIAGNOSIS — Z8541 Personal history of malignant neoplasm of cervix uteri: Secondary | ICD-10-CM | POA: Diagnosis not present

## 2016-03-29 DIAGNOSIS — F17210 Nicotine dependence, cigarettes, uncomplicated: Secondary | ICD-10-CM | POA: Diagnosis not present

## 2016-03-29 DIAGNOSIS — I340 Nonrheumatic mitral (valve) insufficiency: Secondary | ICD-10-CM

## 2016-03-29 DIAGNOSIS — I421 Obstructive hypertrophic cardiomyopathy: Secondary | ICD-10-CM | POA: Diagnosis present

## 2016-03-29 DIAGNOSIS — I484 Atypical atrial flutter: Secondary | ICD-10-CM | POA: Diagnosis not present

## 2016-03-29 DIAGNOSIS — I513 Intracardiac thrombosis, not elsewhere classified: Secondary | ICD-10-CM | POA: Diagnosis not present

## 2016-03-29 DIAGNOSIS — Z8679 Personal history of other diseases of the circulatory system: Secondary | ICD-10-CM

## 2016-03-29 DIAGNOSIS — J449 Chronic obstructive pulmonary disease, unspecified: Secondary | ICD-10-CM | POA: Insufficient documentation

## 2016-03-29 DIAGNOSIS — Z79899 Other long term (current) drug therapy: Secondary | ICD-10-CM | POA: Insufficient documentation

## 2016-03-29 DIAGNOSIS — I4892 Unspecified atrial flutter: Secondary | ICD-10-CM | POA: Diagnosis present

## 2016-03-29 DIAGNOSIS — I4891 Unspecified atrial fibrillation: Secondary | ICD-10-CM | POA: Diagnosis not present

## 2016-03-29 DIAGNOSIS — I429 Cardiomyopathy, unspecified: Secondary | ICD-10-CM

## 2016-03-29 DIAGNOSIS — I422 Other hypertrophic cardiomyopathy: Secondary | ICD-10-CM | POA: Diagnosis not present

## 2016-03-29 HISTORY — PX: TEE WITHOUT CARDIOVERSION: SHX5443

## 2016-03-29 SURGERY — ECHOCARDIOGRAM, TRANSESOPHAGEAL
Anesthesia: Moderate Sedation

## 2016-03-29 SURGERY — ATRIAL FIBRILLATION ABLATION
Anesthesia: Monitor Anesthesia Care

## 2016-03-29 MED ORDER — FENTANYL CITRATE (PF) 100 MCG/2ML IJ SOLN
INTRAMUSCULAR | Status: DC | PRN
  Administered 2016-03-29 (×2): 25 ug via INTRAVENOUS

## 2016-03-29 MED ORDER — SODIUM CHLORIDE 0.9 % IV SOLN: INTRAVENOUS | Status: DC

## 2016-03-29 MED ORDER — LEVALBUTEROL HCL 0.63 MG/3ML IN NEBU
0.6300 mg | INHALATION_SOLUTION | Freq: Once | RESPIRATORY_TRACT | Status: AC
  Administered 2016-03-29: 0.63 mg via RESPIRATORY_TRACT

## 2016-03-29 MED ORDER — LEVALBUTEROL HCL 0.63 MG/3ML IN NEBU
INHALATION_SOLUTION | RESPIRATORY_TRACT | Status: AC
  Filled 2016-03-29: qty 3

## 2016-03-29 MED ORDER — BUTAMBEN-TETRACAINE-BENZOCAINE 2-2-14 % EX AERO
INHALATION_SPRAY | CUTANEOUS | Status: DC | PRN
  Administered 2016-03-29: 2 via TOPICAL

## 2016-03-29 MED ORDER — MIDAZOLAM HCL 5 MG/ML IJ SOLN
INTRAMUSCULAR | Status: AC
  Filled 2016-03-29: qty 2

## 2016-03-29 MED ORDER — ISOPROTERENOL HCL 0.2 MG/ML IJ SOLN
INTRAMUSCULAR | Status: AC
  Filled 2016-03-29: qty 5

## 2016-03-29 MED ORDER — FENTANYL CITRATE (PF) 100 MCG/2ML IJ SOLN
INTRAMUSCULAR | Status: AC
  Filled 2016-03-29: qty 2

## 2016-03-29 MED ORDER — MIDAZOLAM HCL 10 MG/2ML IJ SOLN
INTRAMUSCULAR | Status: DC | PRN
  Administered 2016-03-29 (×2): 2 mg via INTRAVENOUS

## 2016-03-29 NOTE — Interval H&P Note (Signed)
History and Physical Interval Note:  03/29/2016 9:13 AM  Kathleen Wright  has presented today for surgery, with the diagnosis of AFIB  The various methods of treatment have been discussed with the patient and family. After consideration of risks, benefits and other options for treatment, the patient has consented to  Procedure(s): TRANSESOPHAGEAL ECHOCARDIOGRAM (TEE) (N/A) as a surgical intervention .  The patient's history has been reviewed, patient examined, no change in status, stable for surgery.  I have reviewed the patient's chart and labs.  Questions were answered to the patient's satisfaction.     Kirk Ruths

## 2016-03-29 NOTE — Progress Notes (Signed)
Please refer Ms Lary to Dr Mina Marble at [facility] for the hyertrophic cardiomyopathy clinic.  Thanks!  JA   Attached Notes   Progress Notes by [name], MD at 03/29/2016 10:47 AM   Author: [name], MD Service: Electrophysiology Author Type: Physician  Filed: 03/29/2016 11:12 AM Date of Service: 03/29/2016 10:47 AM Note Type: Progress Notes  Status: Signed Editor: [name], MD (Physician)    I spoke with Dr Stanford Breed about TEE result.  Thrombus has improved but not resolved. Continue pradaxa.  Ablation is cancelled.  She has a papular rash on her palms/ feet which she attributes to anticoagulation.  I have encouraged her to see dermatology for this.  Will refer to Hypertrophyic CM clinic at [facility] for further evaluation of her complex heart disease. She may benefit from EP evaluation at [facility] as well.  Return to see me in 4 weeks  [name] MD, [facility] 03/29/2016 11:11 AM

## 2016-03-29 NOTE — Interval H&P Note (Signed)
History and Physical Interval Note:  03/29/2016 8:11 AM  Kathleen Wright  has presented today for surgery, with the diagnosis of afib  The various methods of treatment have been discussed with the patient and family. After consideration of risks, benefits and other options for treatment, the patient has consented to  Procedure(s): Atrial Fibrillation Ablation (N/A) as a surgical intervention .  The patient's history has been reviewed, patient examined, no change in status, stable for surgery.  I have reviewed the patient's chart and labs.  Questions were answered to the patient's satisfaction.  TEE to exclude LAA thrombus prior to ablation is planned.   [name]

## 2016-03-29 NOTE — H&P (View-Only) (Signed)
Electrophysiology Office Note   Date:  03/02/2016   ID:  Kathleen Wright, DOB January 20, 1966, MRN [id number]  PCP:  [name]., MD  Cardiologist:  Dr [name] Primary Electrophysiologist: [name], MD    Chief Complaint  Patient presents with  . Atrial Flutter     History of Present Illness: Kathleen Wright is a 50 y.o. female who presents today for electrophysiology evaluation.   The patient has atrial fibrillation and atypical atrial flutter.  She has a h/o HCM and is s/p septal myomectomy at [facility] in 2006.  She had post operative AF and was placed on amiodarone.  This was discontinued due to elevated LFTs.  She did well but has recently developed recurrent atrial arrhythmias.  She has had multiple episodes of atypical atrial flutter since 12/16 for which she has had several hospitalizations and has required cardioversions.  I did see her 06/10/15 (my note reviewed) but have not seen her since.  At that time we discussed tikosyn vs ablation but she declined both.  She recently presented for ablation but was found to have LAA thrombus on xarelto.  She was switched to pradaxa.  She reports compliance.   She is in afib today.  She reports fatigue and decreased exercise tolerance.  Today, she denies symptoms of  chest pain, shortness of breath, orthopnea, PND, lower extremity edema, claudication, dizziness, presyncope, syncope, bleeding, or neurologic sequela. The patient is tolerating medications without difficulties and is otherwise without complaint today.    Past Medical History:  Diagnosis Date  . Asthma   . Cervical cancer ([place])    a. treated with partial removal of cervix  . COPD (chronic obstructive pulmonary disease) ([place])   . Hypertrophic cardiomyopathy ([place])    a. s/p myomectomy at [facility] 08/2004  . Paroxysmal atrial flutter ([facility])    a. post-op after myomectomy in 2006. s/p TEE/DCCV.  . Transient atrial fibrillation ([place]) 2006   a. after myomectomy at [facility] in 08/2004 -  was on amiodarone but developed elevated LFTs felt possibly secondary to amiodarone    Past Surgical History:  Procedure Laterality Date  . CARDIOVERSION N/A 08/12/2015   Procedure: CARDIOVERSION;  Surgeon: Satira Sark, MD;  Location[facility];  Service: Cardiovascular;  Laterality: N/A;  . Septal myomectomy  2006   [facility]  . TUBAL LIGATION       Current Outpatient Prescriptions  Medication Sig Dispense Refill  . ALPRAZolam (XANAX) 1 MG tablet Take 1 mg by mouth 3 (three) times daily as needed for anxiety.     . dabigatran (PRADAXA) 150 MG CAPS capsule Take 1 capsule (150 mg total) by mouth 2 (two) times daily. 60 capsule 2  . diltiazem (CARDIZEM CD) 300 MG 24 hr capsule Take 1 capsule (300 mg total) by mouth daily. 30 capsule 0  . furosemide (LASIX) 20 MG tablet Take 20-60 mg by mouth daily as needed for fluid. Takes potassium with furosemide.    Marland Kitchen HYDROcodone-acetaminophen (NORCO) 10-325 MG tablet Take 1 tablet by mouth every 6 (six) hours as needed for moderate pain or severe pain.     Marland Kitchen ibuprofen (ADVIL,MOTRIN) 800 MG tablet Take 800-1,600 mg by mouth 3 (three) times daily as needed for moderate pain.     Marland Kitchen levalbuterol (XOPENEX) 0.63 MG/3ML nebulizer solution Take 0.63 mg by nebulization every 6 (six) hours as needed for wheezing or shortness of breath.     . metoprolol tartrate (LOPRESSOR) 25 MG tablet TAKE 2 TABLETS BY MOUTH  TWICE DAILY. 120 tablet 10  . potassium chloride SA (K-DUR,KLOR-CON) 20 MEQ tablet Take 1 tablet by mouth daily as needed. When you take Lasix    . PROAIR HFA 108 (90 Base) MCG/ACT inhaler Inhale 1-2 puffs into the lungs every 6 (six) hours as needed for wheezing or shortness of breath.      No current facility-administered medications for this visit.     Allergies:   Amiodarone   Social History:  The patient  reports that she has been smoking Cigarettes.  She has been smoking about 1.00 pack per day. She does not have any smokeless tobacco history on  file. She reports that she does not drink alcohol or use drugs.   Family History:  The patient's  family history includes Heart failure in her father.    ROS:  Please see the history of present illness.   All other systems are reviewed and negative.    PHYSICAL EXAM: VS:  BP 112/74   Pulse 88   Ht 5\' 1"  (1.549 m)   Wt 210 lb (95.3 kg)   BMI 39.68 kg/m  , BMI Body mass index is 39.68 kg/m. GEN: Well nourished, well developed, in no acute distress  HEENT: normal  Neck: no JVD, carotid bruits, or masses Cardiac: iRRR; no murmurs, rubs, or gallops,no edema  Respiratory:  clear to auscultation bilaterally, normal work of breathing GI: soft, nontender, nondistended, + BS MS: no deformity or atrophy  Skin: warm and dry  Neuro:  Strength and sensation are intact Psych: euthymic mood, full affect  EKG:  EKG is ordered today. The ekg ordered today shows afib 88 bpm, LVH, nonspecific St/T changes   Recent Labs: 08/10/2015: TSH 1.570 08/12/2015: ALT 24 01/26/2016: BUN 20; Creat 0.93; Hemoglobin 12.8; Platelets 362; Potassium 4.9; Sodium 139    Lipid Panel  No results found for: CHOL, TRIG, HDL, CHOLHDL, VLDL, LDLCALC, LDLDIRECT   Wt Readings from Last 3 Encounters:  03/02/16 210 lb (95.3 kg)  02/02/16 205 lb (93 kg)  01/06/16 210 lb 6.4 oz (95.4 kg)      Other studies Reviewed: Additional studies/ records that were reviewed today include: hospital records, clinic records, prior ekgs and echos    ASSESSMENT AND PLAN:  1.  Atypical atrial flutter/ atrial fibrillation The patient has symptomatic atrial arrhythmias.  Therapeutic strategies for atypical atrial flutter and afib including medicine and ablation were discussed in detail with the patient today.  I have offered tikosyn.  She is clear that she would like to avoid additional medicines.  Risk, benefits, and alternatives to EP study and radiofrequency ablation were also discussed in detail today. These risks include but are  not limited to stroke, bleeding, vascular damage, tamponade, perforation, damage to the esophagus, lungs, and other structures, pulmonary vein stenosis, worsening renal function, and death. The patient understands these risk and wishes to proceed.  Will need TEE prior to ablation to make sure LAA thrombus is gone. Importance of compliance with anticoagulation was stressed today. I worry that our ability to control her arrhythmias long term is low.  I have discussed this with her today.  I have also told her that given LAA thrombus previously that she will require lifelong anticoagulation.  She has blisters on her hands and feet which she attributes to anticoagulation (both xarelto and pradaxa).  I have encouraged her to see dermatology regarding this.  Current medicines are reviewed at length with the patient today.   The patient does not have  concerns regarding her medicines.  The following changes were made today:  none    Signed, [name], MD  03/02/2016 3:16 PM     [place] [place] [address] [phone] (office) [phone] (fax)

## 2016-03-29 NOTE — Progress Notes (Signed)
*  PRELIMINARY RESULTS* Echocardiogram Echocardiogram Transesophageal has been performed.  Kathleen Wright 03/29/2016, 12:12 PM

## 2016-03-29 NOTE — Progress Notes (Signed)
I spoke with Dr Stanford Breed about TEE result.  Thrombus has improved but not resolved. Continue pradaxa.  Ablation is cancelled.  She has a papular rash on her palms/ feet which she attributes to anticoagulation.  I have encouraged her to see dermatology for this.  Will refer to Hypertrophyic CM clinic at [facility] for further evaluation of her complex heart disease. She may benefit from EP evaluation at [facility] as well.  Return to see me in 4 weeks  [name] MD, [facility] 03/29/2016 11:11 AM

## 2016-03-29 NOTE — Progress Notes (Signed)
Patient noted to have audible wheezing post TEE. Uses Xopenex Q6 PRN at home. Verbal order received from Dr. Stanford Breed to administer x1 now. Less audible wheezing noted after treatment.

## 2016-03-29 NOTE — Discharge Instructions (Signed)
TEE  YOU HAD AN CARDIAC PROCEDURE TODAY: Refer to the procedure report and other information in the discharge instructions given to you for any specific questions about what was found during the examination. If this information does not answer your questions, please call [facility] office at [phone] to clarify.   DIET: Your first meal following the procedure should be a light meal and then it is ok to progress to your normal diet. A half-sandwich or bowl of soup is an example of a good first meal. Heavy or fried foods are harder to digest and may make you feel nauseous or bloated. Drink plenty of fluids but you should avoid alcoholic beverages for 24 hours. If you had a esophageal dilation, please see attached instructions for diet.   ACTIVITY: Your care partner should take you home directly after the procedure. You should plan to take it easy, moving slowly for the rest of the day. You can resume normal activity the day after the procedure however YOU SHOULD NOT DRIVE, use power tools, machinery or perform tasks that involve climbing or major physical exertion for 24 hours (because of the sedation medicines used during the test).   SYMPTOMS TO REPORT IMMEDIATELY: A cardiologist can be reached at any hour. Please call [phone] for any of the following symptoms:  Vomiting of blood or coffee ground material  New, significant abdominal pain  New, significant chest pain or pain under the shoulder blades  Painful or persistently difficult swallowing  New shortness of breath  Black, tarry-looking or red, bloody stools  FOLLOW UP:  Please also call with any specific questions about appointments or follow up tests.  Moderate Conscious Sedation, Adult, Care After Refer to this sheet in the next few weeks. These instructions provide you with information on caring for yourself after your procedure. Your health care provider may also give you more specific instructions. Your treatment has been  planned according to current medical practices, but problems sometimes occur. Call your health care provider if you have any problems or questions after your procedure. WHAT TO EXPECT AFTER THE PROCEDURE  After your procedure:  You may feel sleepy, clumsy, and have poor balance for several hours.  Vomiting may occur if you eat too soon after the procedure. HOME CARE INSTRUCTIONS  Do not participate in any activities where you could become injured for at least 24 hours. Do not:  Drive.  Swim.  Ride a bicycle.  Operate heavy machinery.  Cook.  Use power tools.  Climb ladders.  Work from a high place.  Do not make important decisions or sign legal documents until you are improved.  If you vomit, drink water, juice, or soup when you can drink without vomiting. Make sure you have little or no nausea before eating solid foods.  Only take over-the-counter or prescription medicines for pain, discomfort, or fever as directed by your health care provider.  Make sure you and your family fully understand everything about the medicines given to you, including what side effects may occur.  You should not drink alcohol, take sleeping pills, or take medicines that cause drowsiness for at least 24 hours.  If you smoke, do not smoke without supervision.  If you are feeling better, you may resume normal activities 24 hours after you were sedated.  Keep all appointments with your health care provider. SEEK MEDICAL CARE IF:  Your skin is pale or bluish in color.  You continue to feel nauseous or vomit.  Your pain is getting  worse and is not helped by medicine.  You have bleeding or swelling.  You are still sleepy or feeling clumsy after 24 hours. SEEK IMMEDIATE MEDICAL CARE IF:  You develop a rash.  You have difficulty breathing.  You develop any type of allergic problem.  You have a fever. MAKE SURE YOU:  Understand these instructions.  Will watch your condition.  Will  get help right away if you are not doing well or get worse.   This information is not intended to replace advice given to you by your health care provider. Make sure you discuss any questions you have with your health care provider.   Document Released: 03/20/2013 Document Revised: 06/20/2014 Document Reviewed: 03/20/2013 Elsevier Interactive Patient Education [employer].

## 2016-03-29 NOTE — Anesthesia Preprocedure Evaluation (Deleted)
Anesthesia Evaluation  Patient identified by MRN, date of birth, ID band Patient awake    Reviewed: Allergy & Precautions, NPO status , Patient's Chart, lab work & pertinent test results  Airway Mallampati: II  TM Distance: >3 FB Neck ROM: Full    Dental  (+) Teeth Intact, Dental Advisory Given   Pulmonary former smoker,    breath sounds clear to auscultation       Cardiovascular  Rhythm:Irregular Rate:Normal     Neuro/Psych    GI/Hepatic   Endo/Other    Renal/GU      Musculoskeletal   Abdominal   Peds  Hematology   Anesthesia Other Findings   Reproductive/Obstetrics                             Anesthesia Physical Anesthesia Plan  ASA: III  Anesthesia Plan: General   Post-op Pain Management:    Induction: Intravenous  Airway Management Planned: LMA  Additional Equipment:   Intra-op Plan:   Post-operative Plan:   Informed Consent:   Dental advisory given  Plan Discussed with: CRNA and Anesthesiologist  Anesthesia Plan Comments:         Anesthesia Quick Evaluation

## 2016-03-29 NOTE — CV Procedure (Signed)
See full TEE report in [employer] Pt sedated with versed 4 mg and fentanyl 50 micrograms IV. Normal LV systolic function LAA thrombus and spontaneous contrast noted in LAA with decreased emptying velocity. Kirk Ruths, MD

## 2016-03-30 ENCOUNTER — Encounter (HOSPITAL_COMMUNITY): Payer: Self-pay | Admitting: Cardiology

## 2016-04-14 DIAGNOSIS — G894 Chronic pain syndrome: Secondary | ICD-10-CM | POA: Diagnosis not present

## 2016-04-14 DIAGNOSIS — J9801 Acute bronchospasm: Secondary | ICD-10-CM | POA: Diagnosis not present

## 2016-04-14 DIAGNOSIS — L309 Dermatitis, unspecified: Secondary | ICD-10-CM | POA: Diagnosis not present

## 2016-04-18 ENCOUNTER — Encounter: Payer: Self-pay | Admitting: Internal Medicine

## 2016-04-18 DIAGNOSIS — I422 Other hypertrophic cardiomyopathy: Secondary | ICD-10-CM | POA: Diagnosis not present

## 2016-04-18 DIAGNOSIS — I480 Paroxysmal atrial fibrillation: Secondary | ICD-10-CM | POA: Diagnosis not present

## 2016-04-26 ENCOUNTER — Ambulatory Visit (HOSPITAL_COMMUNITY): Payer: Medicare Other | Admitting: Nurse Practitioner

## 2016-05-25 ENCOUNTER — Encounter: Payer: Self-pay | Admitting: Internal Medicine

## 2016-05-30 ENCOUNTER — Telehealth: Payer: Self-pay | Admitting: Internal Medicine

## 2016-05-30 DIAGNOSIS — I4819 Other persistent atrial fibrillation: Secondary | ICD-10-CM

## 2016-05-30 NOTE — Telephone Encounter (Signed)
Pt is wondering why is has to do a f/u she was told she was going to be admitted to get medication

## 2016-05-30 NOTE — Telephone Encounter (Signed)
Spoke with patient and she is questioning if she needs follow up appointment. I let her know I would obtain her most recent record from [facility] and call her back

## 2016-06-01 ENCOUNTER — Ambulatory Visit (INDEPENDENT_AMBULATORY_CARE_PROVIDER_SITE_OTHER): Payer: Medicare Other | Admitting: Internal Medicine

## 2016-06-01 ENCOUNTER — Encounter: Payer: Self-pay | Admitting: Internal Medicine

## 2016-06-01 VITALS — BP 110/80 | HR 67 | Ht 61.0 in | Wt 211.0 lb

## 2016-06-01 DIAGNOSIS — I421 Obstructive hypertrophic cardiomyopathy: Secondary | ICD-10-CM | POA: Diagnosis not present

## 2016-06-01 DIAGNOSIS — I481 Persistent atrial fibrillation: Secondary | ICD-10-CM

## 2016-06-01 DIAGNOSIS — I484 Atypical atrial flutter: Secondary | ICD-10-CM

## 2016-06-01 DIAGNOSIS — I4819 Other persistent atrial fibrillation: Secondary | ICD-10-CM

## 2016-06-01 NOTE — Progress Notes (Signed)
Electrophysiology Office Note   Date:  06/01/2016   ID:  Kathleen Wright, DOB Aug 29, 1965, MRN [id number]  PCP:  [name]., MD  Cardiologist:  Dr [name] Primary Electrophysiologist: [name], MD    Chief Complaint  Patient presents with  . Atrial Fibrillation     History of Present Illness: Kathleen Wright is a 50 y.o. female who presents today for electrophysiology evaluation.   The patient has atrial fibrillation and atypical atrial flutter.  She has a h/o HCM and is s/p septal myomectomy at [facility] in 2006.  She had post operative AF and was placed on amiodarone.  This was discontinued due to elevated LFTs.  She did well but has recently developed recurrent atrial arrhythmias.  She has had multiple episodes of atypical atrial flutter since 12/16 for which she has had several hospitalizations and has required cardioversions.    She reports symptoms of tachypalpitations and fatigue with her atrial arrhythmias.  She presented for EPS and RFA by me but had LAA thrombus.  Ablation was therefore not performed.  She was sent to [facility] for evaluation in the Hypertrophic CM clinic.  She was recently seen by Dr Mina Marble.  He has recommended addition of ASA to her pradaxa.  He recommended follow-up TEE in 2 months followed by initiation of sotalol.  She prefers to have this done in [place] rather than [place].  Today, she denies symptoms of  chest pain, shortness of breath, orthopnea, PND, lower extremity edema, claudication, dizziness, presyncope, syncope, bleeding, or neurologic sequela. The patient is tolerating medications without difficulties and is otherwise without complaint today.    Past Medical History:  Diagnosis Date  . Asthma   . Cervical cancer ([place])    a. treated with partial removal of cervix  . COPD (chronic obstructive pulmonary disease) ([place])   . Hypertrophic cardiomyopathy ([place])    a. s/p myomectomy at [facility] 08/2004  . Paroxysmal atrial flutter ([facility])    a. post-op  after myomectomy in 2006. s/p TEE/DCCV.  . Transient atrial fibrillation ([place]) 2006   a. after myomectomy at [facility] in 08/2004 - was on amiodarone but developed elevated LFTs felt possibly secondary to amiodarone    Past Surgical History:  Procedure Laterality Date  . CARDIOVERSION N/A 08/12/2015   Procedure: CARDIOVERSION;  Surgeon: Satira Sark, MD;  [facility];  Service: Cardiovascular;  Laterality: N/A;  . Septal myomectomy  2006   [facility]  . TEE WITHOUT CARDIOVERSION N/A 03/29/2016   Procedure: TRANSESOPHAGEAL ECHOCARDIOGRAM (TEE);  Surgeon: Lelon Perla, MD;  Location: [facility] ENDOSCOPY;  Service: Cardiovascular;  Laterality: N/A;  . TUBAL LIGATION       Current Outpatient Prescriptions  Medication Sig Dispense Refill  . ALPRAZolam (XANAX) 1 MG tablet Take 1 mg by mouth 3 (three) times daily as needed for anxiety.     Marland Kitchen aspirin EC 81 MG tablet Take 81 mg by mouth daily.    . dabigatran (PRADAXA) 150 MG CAPS capsule Take 1 capsule (150 mg total) by mouth 2 (two) times daily. 180 capsule 3  . diltiazem (CARDIZEM CD) 300 MG 24 hr capsule Take 1 capsule (300 mg total) by mouth daily. 30 capsule 0  . furosemide (LASIX) 20 MG tablet Take 20-60 mg by mouth daily as needed for fluid or edema (depends on how much swelling if patient takes 20-60 mg). Takes potassium with furosemide.    Marland Kitchen HYDROcodone-acetaminophen (NORCO) 10-325 MG tablet Take 1 tablet by mouth every 6 (six)  hours as needed for moderate pain or severe pain.     Marland Kitchen ibuprofen (ADVIL,MOTRIN) 800 MG tablet Take 800-1,600 mg by mouth 3 (three) times daily as needed for moderate pain.     Marland Kitchen levalbuterol (XOPENEX) 0.63 MG/3ML nebulizer solution Take 0.63 mg by nebulization every 6 (six) hours as needed for wheezing or shortness of breath.     . metoprolol tartrate (LOPRESSOR) 25 MG tablet TAKE 2 TABLETS BY MOUTH TWICE DAILY. 120 tablet 10  . mupirocin cream (BACTROBAN) 2 % Apply 1 application topically daily as needed (wound  care).    . potassium chloride SA (K-DUR,KLOR-CON) 20 MEQ tablet Take 1 tablet by mouth daily as needed. When you take Lasix    . PROAIR HFA 108 (90 Base) MCG/ACT inhaler Inhale 1-2 puffs into the lungs every 6 (six) hours as needed for wheezing or shortness of breath.      No current facility-administered medications for this visit.     Allergies:   Amiodarone   Social History:  The patient  reports that she quit smoking about 2 months ago. Her smoking use included Cigarettes. She smoked 0.50 packs per day. She has never used smokeless tobacco. She reports that she does not drink alcohol or use drugs.   Family History:  The patient's  family history includes Heart failure in her father.    ROS:  Please see the history of present illness.   All other systems are reviewed and negative.    PHYSICAL EXAM: VS:  BP 110/80   Pulse 67   Ht 5\' 1"  (1.549 m)   Wt 211 lb (95.7 kg)   SpO2 90%   BMI 39.87 kg/m  , BMI Body mass index is 39.87 kg/m. GEN: Well nourished, well developed, in no acute distress  HEENT: normal  Neck: no JVD, carotid bruits, or masses Cardiac: iRRR; no murmurs, rubs, or gallops,no edema  Respiratory:  clear to auscultation bilaterally, normal work of breathing GI: soft, nontender, nondistended, + BS MS: no deformity or atrophy  Skin: warm and dry  Neuro:  Strength and sensation are intact Psych: euthymic mood, full affect  EKG:  EKG is ordered today. The ekg ordered today shows afib with V rates 67 bpm, nonspecific ST/T changes   Recent Labs: 08/10/2015: TSH 1.570 08/12/2015: ALT 24 03/22/2016: BUN 23; Creat 1.17; Hemoglobin 13.4; Platelets 351; Potassium 4.7; Sodium 137    Lipid Panel  No results found for: CHOL, TRIG, HDL, CHOLHDL, VLDL, LDLCALC, LDLDIRECT   Wt Readings from Last 3 Encounters:  06/01/16 211 lb (95.7 kg)  03/29/16 210 lb (95.3 kg)  03/02/16 210 lb (95.3 kg)      Other studies Reviewed: Additional studies/ records that were reviewed  today include: [facility] records   ASSESSMENT AND PLAN:  1.  Atypical atrial flutter/ atrial fibrillation The patient has symptomatic atrial arrhythmias.  Ablation has been limited by LAA thrombus.  She has seen Dr Mina Marble who has added ASA to pradaxa.  He has advised repeat TEE in January followed by Sotalol initiation. Risks, benefits, and alternatives to TEE, cardioversion and admission for sotalol were discussed at length.  She wishes to proceed. We will therefore schedule for TEE in January.  If no LAA, would advise cardioversion followed by admission for initiation of sotalol.  I am happy to coordinate this admission after her TEE/ cardioversion.  Today, I have spent 40 minutes with the patient discussing the above .  More than 50% of the visit time today  was spent on this issue.  Signed, [name], MD   [address] [place] [address] [phone] (office) [phone] (fax)

## 2016-06-01 NOTE — Patient Instructions (Signed)
Medication Instructions:  Your physician recommends that you continue on your current medications as directed. Please refer to the Current Medication list given to you today.   Labwork: Your physician recommends that you return for lab work 06/16/16   Testing/Procedures: Your physician has requested that you have a TEE/Cardioversion. During a TEE, sound waves are used to create images of your heart. It provides your doctor with information about the size and shape of your heart and how well your heart's chambers and valves are working. In this test, a transducer is attached to the end of a flexible tube that is guided down you throat and into your esophagus (the tube leading from your mouth to your stomach) to get a more detailed image of your heart. Once the TEE has determined that a blood clot is not present, the cardioversion begins. Electrical Cardioversion uses a jolt of electricity to your heart either through paddles or wired patches attached to your chest. This is a controlled, usually prescheduled, procedure. This procedure is done at the hospital and you are not awake during the procedure. You usually go home the day of the procedure. Please see the instruction sheet given to you today for more information.--06/23/16.  Then admit for Sotalol load for 3 days    Follow-Up:  Your physician recommends that you schedule a follow-up appointment after discharge    Any Other Special Instructions Will Be Listed Below (If Applicable).     If you need a refill on your cardiac medications before your next appointment, please call your pharmacy.

## 2016-06-16 ENCOUNTER — Other Ambulatory Visit: Payer: Medicare Other | Admitting: *Deleted

## 2016-06-16 DIAGNOSIS — I481 Persistent atrial fibrillation: Secondary | ICD-10-CM | POA: Diagnosis not present

## 2016-06-16 DIAGNOSIS — I4819 Other persistent atrial fibrillation: Secondary | ICD-10-CM

## 2016-06-16 NOTE — Addendum Note (Signed)
Addended by: Janan Halter F on: 06/16/2016 11:41 AM   Modules accepted: Orders

## 2016-06-17 LAB — CBC WITH DIFFERENTIAL/PLATELET
BASOS ABS: 0 10*3/uL (ref 0.0–0.2)
Basos: 1 %
EOS (ABSOLUTE): 0.2 10*3/uL (ref 0.0–0.4)
Eos: 2 %
HEMOGLOBIN: 12 g/dL (ref 11.1–15.9)
Hematocrit: 36.1 % (ref 34.0–46.6)
Immature Grans (Abs): 0 10*3/uL (ref 0.0–0.1)
Immature Granulocytes: 0 %
LYMPHS ABS: 2.1 10*3/uL (ref 0.7–3.1)
Lymphs: 24 %
MCH: 28.9 pg (ref 26.6–33.0)
MCHC: 33.2 g/dL (ref 31.5–35.7)
MCV: 87 fL (ref 79–97)
Monocytes Absolute: 0.8 10*3/uL (ref 0.1–0.9)
Monocytes: 9 %
NEUTROS ABS: 5.6 10*3/uL (ref 1.4–7.0)
Neutrophils: 64 %
Platelets: 283 10*3/uL (ref 150–379)
RBC: 4.15 x10E6/uL (ref 3.77–5.28)
RDW: 16.1 % — ABNORMAL HIGH (ref 12.3–15.4)
WBC: 8.8 10*3/uL (ref 3.4–10.8)

## 2016-06-17 LAB — BASIC METABOLIC PANEL
BUN / CREAT RATIO: 13 (ref 9–23)
BUN: 14 mg/dL (ref 6–24)
CHLORIDE: 106 mmol/L (ref 96–106)
CO2: 22 mmol/L (ref 18–29)
Calcium: 8.7 mg/dL (ref 8.7–10.2)
Creatinine, Ser: 1.04 mg/dL — ABNORMAL HIGH (ref 0.57–1.00)
GFR calc Af Amer: 72 mL/min/{1.73_m2} (ref >59)
GFR calc non Af Amer: 62 mL/min/{1.73_m2} (ref >59)
Glucose: 98 mg/dL (ref 65–99)
POTASSIUM: 4.5 mmol/L (ref 3.5–5.2)
SODIUM: 143 mmol/L (ref 134–144)

## 2016-06-17 LAB — MAGNESIUM: MAGNESIUM: 1.9 mg/dL (ref 1.6–2.3)

## 2016-06-20 NOTE — Telephone Encounter (Signed)
Called patient and left her that her a message that her TEE/DCCV is scheduled for 06/23/16 at 10am.  She is to be at the [facility] at 8:30am. NPO after midnight and okay to take her morning medications with a small sip of water.  If all goes well will be admitted for a 3 day Sotalol load after

## 2016-06-22 ENCOUNTER — Other Ambulatory Visit: Payer: Self-pay | Admitting: Nurse Practitioner

## 2016-06-23 ENCOUNTER — Ambulatory Visit (HOSPITAL_BASED_OUTPATIENT_CLINIC_OR_DEPARTMENT_OTHER): Payer: Medicare Other

## 2016-06-23 ENCOUNTER — Ambulatory Visit (HOSPITAL_COMMUNITY)
Admission: RE | Admit: 2016-06-23 | Discharge: 2016-06-23 | Disposition: A | Discharge status: Home / Self Care | Payer: Medicare Other | Source: Ambulatory Visit | Attending: Cardiovascular Disease | Admitting: Cardiovascular Disease

## 2016-06-23 ENCOUNTER — Encounter (HOSPITAL_COMMUNITY): Payer: Self-pay

## 2016-06-23 ENCOUNTER — Ambulatory Visit (HOSPITAL_COMMUNITY): Payer: Medicare Other | Admitting: Anesthesiology

## 2016-06-23 ENCOUNTER — Encounter (HOSPITAL_COMMUNITY)
Admission: RE | Disposition: A | Discharge status: Home / Self Care | Payer: Self-pay | Source: Ambulatory Visit | Attending: Cardiovascular Disease

## 2016-06-23 DIAGNOSIS — Z7982 Long term (current) use of aspirin: Secondary | ICD-10-CM | POA: Diagnosis not present

## 2016-06-23 DIAGNOSIS — I4892 Unspecified atrial flutter: Secondary | ICD-10-CM | POA: Diagnosis not present

## 2016-06-23 DIAGNOSIS — Z8249 Family history of ischemic heart disease and other diseases of the circulatory system: Secondary | ICD-10-CM | POA: Diagnosis not present

## 2016-06-23 DIAGNOSIS — I081 Rheumatic disorders of both mitral and tricuspid valves: Secondary | ICD-10-CM | POA: Insufficient documentation

## 2016-06-23 DIAGNOSIS — Z888 Allergy status to other drugs, medicaments and biological substances status: Secondary | ICD-10-CM | POA: Diagnosis not present

## 2016-06-23 DIAGNOSIS — I484 Atypical atrial flutter: Secondary | ICD-10-CM | POA: Diagnosis not present

## 2016-06-23 DIAGNOSIS — Z8541 Personal history of malignant neoplasm of cervix uteri: Secondary | ICD-10-CM | POA: Diagnosis not present

## 2016-06-23 DIAGNOSIS — Z7901 Long term (current) use of anticoagulants: Secondary | ICD-10-CM | POA: Diagnosis not present

## 2016-06-23 DIAGNOSIS — Z87891 Personal history of nicotine dependence: Secondary | ICD-10-CM | POA: Diagnosis not present

## 2016-06-23 DIAGNOSIS — I4891 Unspecified atrial fibrillation: Secondary | ICD-10-CM | POA: Insufficient documentation

## 2016-06-23 DIAGNOSIS — J449 Chronic obstructive pulmonary disease, unspecified: Secondary | ICD-10-CM | POA: Insufficient documentation

## 2016-06-23 DIAGNOSIS — I10 Essential (primary) hypertension: Secondary | ICD-10-CM | POA: Insufficient documentation

## 2016-06-23 DIAGNOSIS — I340 Nonrheumatic mitral (valve) insufficiency: Secondary | ICD-10-CM | POA: Diagnosis not present

## 2016-06-23 DIAGNOSIS — M1990 Unspecified osteoarthritis, unspecified site: Secondary | ICD-10-CM | POA: Insufficient documentation

## 2016-06-23 DIAGNOSIS — I209 Angina pectoris, unspecified: Secondary | ICD-10-CM | POA: Diagnosis not present

## 2016-06-23 DIAGNOSIS — I422 Other hypertrophic cardiomyopathy: Secondary | ICD-10-CM | POA: Insufficient documentation

## 2016-06-23 DIAGNOSIS — J45909 Unspecified asthma, uncomplicated: Secondary | ICD-10-CM | POA: Diagnosis not present

## 2016-06-23 HISTORY — PX: TEE WITHOUT CARDIOVERSION: SHX5443

## 2016-06-23 SURGERY — ECHOCARDIOGRAM, TRANSESOPHAGEAL
Anesthesia: Monitor Anesthesia Care

## 2016-06-23 MED ORDER — SODIUM CHLORIDE 0.9 % IV SOLN: INTRAVENOUS | Status: DC

## 2016-06-23 MED ORDER — LACTATED RINGERS IV SOLN
INTRAVENOUS | Status: DC
  Administered 2016-06-23: 1000 mL via INTRAVENOUS

## 2016-06-23 MED ORDER — BUTAMBEN-TETRACAINE-BENZOCAINE 2-2-14 % EX AERO
INHALATION_SPRAY | CUTANEOUS | Status: DC | PRN
  Administered 2016-06-23: 2 via TOPICAL

## 2016-06-23 MED ORDER — PROPOFOL 500 MG/50ML IV EMUL
INTRAVENOUS | Status: DC | PRN
  Administered 2016-06-23: 75 ug/kg/min via INTRAVENOUS

## 2016-06-23 MED ORDER — PROPOFOL 10 MG/ML IV BOLUS
INTRAVENOUS | Status: DC | PRN
  Administered 2016-06-23 (×2): 20 mg via INTRAVENOUS
  Administered 2016-06-23: 30 mg via INTRAVENOUS
  Administered 2016-06-23: 20 mg via INTRAVENOUS

## 2016-06-23 NOTE — H&P (View-Only) (Signed)
Electrophysiology Office Note   Date:  06/01/2016   ID:  Kathleen Wright, DOB Mar 04, 1966, MRN [id number]  PCP:  [name]., MD  Cardiologist:  Dr [name] Primary Electrophysiologist: [name], MD    Chief Complaint  Patient presents with  . Atrial Fibrillation     History of Present Illness: Kathleen Wright is a 51 y.o. female who presents today for electrophysiology evaluation.   The patient has atrial fibrillation and atypical atrial flutter.  She has a h/o HCM and is s/p septal myomectomy at [facility] in 2006.  She had post operative AF and was placed on amiodarone.  This was discontinued due to elevated LFTs.  She did well but has recently developed recurrent atrial arrhythmias.  She has had multiple episodes of atypical atrial flutter since 12/16 for which she has had several hospitalizations and has required cardioversions.    She reports symptoms of tachypalpitations and fatigue with her atrial arrhythmias.  She presented for EPS and RFA by me but had LAA thrombus.  Ablation was therefore not performed.  She was sent to [facility] for evaluation in the Hypertrophic CM clinic.  She was recently seen by Dr Mina Marble.  He has recommended addition of ASA to her pradaxa.  He recommended follow-up TEE in 2 months followed by initiation of sotalol.  She prefers to have this done in [place] rather than [place].  Today, she denies symptoms of  chest pain, shortness of breath, orthopnea, PND, lower extremity edema, claudication, dizziness, presyncope, syncope, bleeding, or neurologic sequela. The patient is tolerating medications without difficulties and is otherwise without complaint today.    Past Medical History:  Diagnosis Date  . Asthma   . Cervical cancer ([place])    a. treated with partial removal of cervix  . COPD (chronic obstructive pulmonary disease) ([place])   . Hypertrophic cardiomyopathy ([place])    a. s/p myomectomy at [facility] 08/2004  . Paroxysmal atrial flutter ([facility])    a. post-op  after myomectomy in 2006. s/p TEE/DCCV.  . Transient atrial fibrillation ([place]) 2006   a. after myomectomy at [facility] in 08/2004 - was on amiodarone but developed elevated LFTs felt possibly secondary to amiodarone    Past Surgical History:  Procedure Laterality Date  . CARDIOVERSION N/A 08/12/2015   Procedure: CARDIOVERSION;  Surgeon: Satira Sark, MD;  [facility];  Service: Cardiovascular;  Laterality: N/A;  . Septal myomectomy  2006   [facility]  . TEE WITHOUT CARDIOVERSION N/A 03/29/2016   Procedure: TRANSESOPHAGEAL ECHOCARDIOGRAM (TEE);  Surgeon: Lelon Perla, MD;  Location: [facility] ENDOSCOPY;  Service: Cardiovascular;  Laterality: N/A;  . TUBAL LIGATION       Current Outpatient Prescriptions  Medication Sig Dispense Refill  . ALPRAZolam (XANAX) 1 MG tablet Take 1 mg by mouth 3 (three) times daily as needed for anxiety.     Marland Kitchen aspirin EC 81 MG tablet Take 81 mg by mouth daily.    . dabigatran (PRADAXA) 150 MG CAPS capsule Take 1 capsule (150 mg total) by mouth 2 (two) times daily. 180 capsule 3  . diltiazem (CARDIZEM CD) 300 MG 24 hr capsule Take 1 capsule (300 mg total) by mouth daily. 30 capsule 0  . furosemide (LASIX) 20 MG tablet Take 20-60 mg by mouth daily as needed for fluid or edema (depends on how much swelling if patient takes 20-60 mg). Takes potassium with furosemide.    Marland Kitchen HYDROcodone-acetaminophen (NORCO) 10-325 MG tablet Take 1 tablet by mouth every 6 (six)  hours as needed for moderate pain or severe pain.     Marland Kitchen ibuprofen (ADVIL,MOTRIN) 800 MG tablet Take 800-1,600 mg by mouth 3 (three) times daily as needed for moderate pain.     Marland Kitchen levalbuterol (XOPENEX) 0.63 MG/3ML nebulizer solution Take 0.63 mg by nebulization every 6 (six) hours as needed for wheezing or shortness of breath.     . metoprolol tartrate (LOPRESSOR) 25 MG tablet TAKE 2 TABLETS BY MOUTH TWICE DAILY. 120 tablet 10  . mupirocin cream (BACTROBAN) 2 % Apply 1 application topically daily as needed (wound  care).    . potassium chloride SA (K-DUR,KLOR-CON) 20 MEQ tablet Take 1 tablet by mouth daily as needed. When you take Lasix    . PROAIR HFA 108 (90 Base) MCG/ACT inhaler Inhale 1-2 puffs into the lungs every 6 (six) hours as needed for wheezing or shortness of breath.      No current facility-administered medications for this visit.     Allergies:   Amiodarone   Social History:  The patient  reports that she quit smoking about 2 months ago. Her smoking use included Cigarettes. She smoked 0.50 packs per day. She has never used smokeless tobacco. She reports that she does not drink alcohol or use drugs.   Family History:  The patient's  family history includes Heart failure in her father.    ROS:  Please see the history of present illness.   All other systems are reviewed and negative.    PHYSICAL EXAM: VS:  BP 110/80   Pulse 67   Ht 5\' 1"  (1.549 m)   Wt 211 lb (95.7 kg)   SpO2 90%   BMI 39.87 kg/m  , BMI Body mass index is 39.87 kg/m. GEN: Well nourished, well developed, in no acute distress  HEENT: normal  Neck: no JVD, carotid bruits, or masses Cardiac: iRRR; no murmurs, rubs, or gallops,no edema  Respiratory:  clear to auscultation bilaterally, normal work of breathing GI: soft, nontender, nondistended, + BS MS: no deformity or atrophy  Skin: warm and dry  Neuro:  Strength and sensation are intact Psych: euthymic mood, full affect  EKG:  EKG is ordered today. The ekg ordered today shows afib with V rates 67 bpm, nonspecific ST/T changes   Recent Labs: 08/10/2015: TSH 1.570 08/12/2015: ALT 24 03/22/2016: BUN 23; Creat 1.17; Hemoglobin 13.4; Platelets 351; Potassium 4.7; Sodium 137    Lipid Panel  No results found for: CHOL, TRIG, HDL, CHOLHDL, VLDL, LDLCALC, LDLDIRECT   Wt Readings from Last 3 Encounters:  06/01/16 211 lb (95.7 kg)  03/29/16 210 lb (95.3 kg)  03/02/16 210 lb (95.3 kg)      Other studies Reviewed: Additional studies/ records that were reviewed  today include: [facility] records   ASSESSMENT AND PLAN:  1.  Atypical atrial flutter/ atrial fibrillation The patient has symptomatic atrial arrhythmias.  Ablation has been limited by LAA thrombus.  She has seen Dr Mina Marble who has added ASA to pradaxa.  He has advised repeat TEE in January followed by Sotalol initiation. Risks, benefits, and alternatives to TEE, cardioversion and admission for sotalol were discussed at length.  She wishes to proceed. We will therefore schedule for TEE in January.  If no LAA, would advise cardioversion followed by admission for initiation of sotalol.  I am happy to coordinate this admission after her TEE/ cardioversion.  Today, I have spent 40 minutes with the patient discussing the above .  More than 50% of the visit time today  was spent on this issue.  Signed, [name], MD   [place] [address] [place] [address] [phone] (office) [phone] (fax)

## 2016-06-23 NOTE — OR Nursing (Signed)
Cardioversion aborted due to smoky flow and possible LLA thrombus,

## 2016-06-23 NOTE — Anesthesia Preprocedure Evaluation (Addendum)
Anesthesia Evaluation  Patient identified by MRN, date of birth, ID band Patient awake    Reviewed: Allergy & Precautions, NPO status , Patient's Chart, lab work & pertinent test results  History of Anesthesia Complications Negative for: history of anesthetic complications  Airway Mallampati: II  TM Distance: >3 FB Neck ROM: Full    Dental no notable dental hx. (+) Edentulous Upper, Dental Advisory Given,    Pulmonary asthma , COPD,  COPD inhaler, Current Smoker, former smoker,    Pulmonary exam normal  + decreased breath sounds      Cardiovascular Exercise Tolerance: Poor hypertension, Pt. on medications + dysrhythmias Atrial Fibrillation  Rhythm:Irregular     Neuro/Psych negative neurological ROS  negative psych ROS   GI/Hepatic negative GI ROS, Neg liver ROS,   Endo/Other    Renal/GU      Musculoskeletal  (+) Arthritis , Osteoarthritis,    Abdominal Normal abdominal exam  (+)   Peds  Hematology negative hematology ROS (+)   Anesthesia Other Findings   Reproductive/Obstetrics                            Anesthesia Physical  Anesthesia Plan  ASA: III  Anesthesia Plan: MAC   Post-op Pain Management:    Induction: Intravenous  Airway Management Planned: Natural Airway and Nasal Cannula  Additional Equipment:   Intra-op Plan:   Post-operative Plan:   Informed Consent: I have reviewed the patients History and Physical, chart, labs and discussed the procedure including the risks, benefits and alternatives for the proposed anesthesia with the patient or authorized representative who has indicated his/her understanding and acceptance.   Dental advisory given  Plan Discussed with:   Anesthesia Plan Comments:       Anesthesia Quick Evaluation

## 2016-06-23 NOTE — Progress Notes (Signed)
  Echocardiogram Echocardiogram Transesophageal has been performed.  Kathleen Wright 06/23/2016, 12:00 PM

## 2016-06-23 NOTE — Discharge Instructions (Signed)
Transesophageal Echocardiogram  Transesophageal echocardiography (TEE) is a picture test of your heart using sound waves. The pictures taken can give very detailed pictures of your heart. This can help your doctor see if there are problems with your heart. TEE can check:  · If your heart has blood clots in it.  · How well your heart valves are working.  · If you have an infection on the inside of your heart.  · Some of the major arteries of your heart.  · If your heart valve is working after a repair.  · Your heart before a procedure that uses a shock to your heart to get the rhythm back to normal.    What happens before the procedure?  · Do not eat or drink for 6 hours before the procedure or as told by your doctor.  · Make plans to have someone drive you home after the procedure. Do not drive yourself home.  · An IV tube will be put in your arm.  What happens during the procedure?  · You will be given a medicine to help you relax (sedative). It will be given through the IV tube.  · A numbing medicine will be sprayed or gargled in the back of your throat to help numb it.  · The tip of the probe is placed into the back of your mouth. You will be asked to swallow. This helps to pass the probe into your esophagus.  · Once the tip of the probe is in the right place, your doctor can take pictures of your heart.  · You may feel pressure at the back of your throat.  What happens after the procedure?  · You will be taken to a recovery area so the sedative can wear off.  · Your throat may be sore and scratchy. This will go away slowly over time.  · You will go home when you are fully awake and able to swallow liquids.  · You should have someone stay with you for the next 24 hours.  · Do not drive or operate machinery for the next 24 hours.  This information is not intended to replace advice given to you by your health care provider. Make sure you discuss any questions you have with your health care provider.  Document  Released: 03/27/2009 Document Revised: 11/05/2015 Document Reviewed: 11/29/2012  Elsevier Interactive Patient Education © 2017 [employer]

## 2016-06-23 NOTE — H&P (Signed)
H&P    Patient ID: GEETHA HORNICK MRN: [id number], DOB/AGE: 15-Aug-1965 51 y.o.  Admit date: 06/23/2016 Date of Visit: 06/23/2016   Primary Physician: [name]., MD Primary Cardiologist: Dr. [name] Electrophysiologist: Dr. [name]  Reason for visit: TEE, possible DCCV and admit for sotalol initiation  HPI: NYLEE HILLARD is a 51 y.o. female who saw Dr. [name] 06/01/16 for electrophysiology evaluation.   The patient has atrial fibrillation and atypical atrial flutter.  She has a h/o HCM and is s/p septal myomectomy at [facility] in 2006.  She had post operative AF and was placed on amiodarone.  This was discontinued due to elevated LFTs.  She did well but has recently developed recurrent atrial arrhythmias.  She has had multiple episodes of atypical atrial flutter since 12/16 for which she has had several hospitalizations and has required cardioversions.     She reported symptoms of tachypalpitations and fatigue with her atrial arrhythmias. She presented in October for EPS and RFA by Dr. [name] but had LAA thrombus and her ablation was therefore not performed.  She was sent to [facility] for evaluation in the Hypertrophic CM clinic.  She was recently seen by Dr Mina Marble.  He has recommended addition of ASA to her pradaxa.  He recommended follow-up TEE in 2 months followed by initiation of sotalol. This is why she comes in today.  She is feeling well, denies any recent illness, no N/V/D or fever, no CP or SOB.  She quit smoking in November!  Past Medical History:  Diagnosis Date  . Asthma   . Cervical cancer ([place])    a. treated with partial removal of cervix  . COPD (chronic obstructive pulmonary disease) ([place])   . Hypertrophic cardiomyopathy ([place])    a. s/p myomectomy at [facility] 08/2004  . Paroxysmal atrial flutter ([facility])    a. post-op after myomectomy in 2006. s/p TEE/DCCV.  . Transient atrial fibrillation ([place]) 2006   a. after myomectomy at [facility] in 08/2004 - was on amiodarone but developed  elevated LFTs felt possibly secondary to amiodarone      Surgical History:  Past Surgical History:  Procedure Laterality Date  . CARDIOVERSION N/A 08/12/2015   Procedure: CARDIOVERSION;  Surgeon: Satira Sark, MD;  [facility];  Service: Cardiovascular;  Laterality: N/A;  . Septal myomectomy  2006   [facility]  . TEE WITHOUT CARDIOVERSION N/A 03/29/2016   Procedure: TRANSESOPHAGEAL ECHOCARDIOGRAM (TEE);  Surgeon: Lelon Perla, MD;  Location: [facility] ENDOSCOPY;  Service: Cardiovascular;  Laterality: N/A;  . TUBAL LIGATION       Prescriptions Prior to Admission  Medication Sig Dispense Refill Last Dose  . ALPRAZolam (XANAX) 1 MG tablet Take 1 mg by mouth 3 (three) times daily as needed for anxiety.    Past Month at Unknown time  . aspirin EC 81 MG tablet Take 81 mg by mouth daily.   06/23/2016 at Unknown time  . dabigatran (PRADAXA) 150 MG CAPS capsule Take 1 capsule (150 mg total) by mouth 2 (two) times daily. 180 capsule 3 06/23/2016 at Unknown time  . diltiazem (CARDIZEM CD) 300 MG 24 hr capsule Take 1 capsule (300 mg total) by mouth daily. 30 capsule 0 06/23/2016 at Unknown time  . furosemide (LASIX) 20 MG tablet Take 20-60 mg by mouth daily as needed for fluid or edema (depends on how much swelling if patient takes 20-60 mg). Takes potassium with furosemide.   Past Week at Unknown time  . HYDROcodone-acetaminophen (NORCO) 10-325 MG  tablet Take 1 tablet by mouth every 6 (six) hours as needed for moderate pain or severe pain.    Past Week at Unknown time  . metoprolol tartrate (LOPRESSOR) 25 MG tablet TAKE 2 TABLETS BY MOUTH TWICE DAILY. 120 tablet 10 06/23/2016 at Unknown time  . potassium chloride SA (K-DUR,KLOR-CON) 20 MEQ tablet Take 1 tablet by mouth daily as needed. When you take Lasix   Past Week at Unknown time  . PROAIR HFA 108 (90 Base) MCG/ACT inhaler Inhale 1-2 puffs into the lungs every 6 (six) hours as needed for wheezing or shortness of breath.    06/22/2016 at Unknown  time  . ibuprofen (ADVIL,MOTRIN) 800 MG tablet Take 800-1,600 mg by mouth 3 (three) times daily as needed for moderate pain.    Taking  . levalbuterol (XOPENEX) 0.63 MG/3ML nebulizer solution Take 0.63 mg by nebulization every 6 (six) hours as needed for wheezing or shortness of breath.    Taking  . mupirocin cream (BACTROBAN) 2 % Apply 1 application topically daily as needed (wound care).   Taking    Inpatient Medications:   Allergies:  Allergies  Allergen Reactions  . Amiodarone Other (See Comments)    Liver function    Social History   Social History  . Marital status: Married    Spouse name: N/A  . Number of children: N/A  . Years of education: N/A   Occupational History  . Housekeeper    Social History Main Topics  . Smoking status: Former Smoker    Packs/day: 0.50    Types: Cigarettes    Quit date: 03/28/2016  . Smokeless tobacco: Never Used     Comment: since age 19  . Alcohol use No  . Drug use: No  . Sexual activity: Not on file   Other Topics Concern  . Not on file   Social History Narrative  . No narrative on file     Family History  Problem Relation Age of Onset  . Heart failure Father   . Diabetes    . Lung disease       Review of Systems: All other systems reviewed and are otherwise negative except as noted above.  Physical Exam: Vitals:   06/23/16 0944  BP: 124/76  Pulse: 68  Resp: 15  Temp: 97.8 F (36.6 C)  TempSrc: Oral  SpO2: 97%  Weight: 211 lb (95.7 kg)  Height: 5\' 1"  (1.549 m)    GEN- The patient is well appearing, alert and oriented x 3 today.   HEENT: normocephalic, atraumatic; sclera clear, conjunctiva pink; hearing intact; oropharynx clear; neck supple, no JVP Lymph- no cervical lymphadenopathy Lungs- Clear to ausculation bilaterally, normal work of breathing.  No wheezes, rales, rhonchi Heart- IRRR, no murmurs, rubs or gallops, PMI not laterally displaced GI- soft, non-tender, non-distended Extremities- no clubbing,  cyanosis, or edema MS- no significant deformity or atrophy Skin- warm and dry, no rash or lesion Psych- euthymic mood, full affect Neuro- no gross deficits observed  Labs:   Lab Results  Component Value Date   WBC 8.8 06/16/2016   HGB 13.4 03/22/2016   HCT 36.1 06/16/2016   MCV 87 06/16/2016   PLT 283 06/16/2016    Recent Labs Lab 06/16/16 1153  NA 143  K 4.5  CL 106  CO2 22  BUN 14  CREATININE 1.04*  CALCIUM 8.7  GLUCOSE 98     Assessment and Plan:   1. Atypical atrial flutter/ atrial fibrillation The patient has symptomatic atrial  arrhythmias.   Ablation cancelled secondary to LAA thrombus.   She has seen Dr Mina Marble who has added ASA to pradaxa.  He has advised repeat TEE in January followed by Sotalol initiation.  The patient assures she has not missed any doses of her Pradaxa or ASA in the last 4 weeks (longer)  2. COPD     Quit smoking in November!  3. HCM     s/p septal myomectomy at [facility] in 2006   Signed, Renee Ursuy, PA-C 06/23/2016 10:06 AM  I have seen, examined the patient, and reviewed the above assessment and plan.  Changes to above are made where necessary.  On exam, iRRR.    Pt with substantial LA enlargement on TEE and apparent thrombus still present.  The thrombus does appear to be slightly improved.  Will continue current medical therapy and discharge without cardioversion.  Drs Acie Fredrickson and I have discussed at length and agree that risks of cardioversion are currently too high. I will follow-up with her in 1 week.  Co Sign: [name], MD 06/23/2016 11:44 AM

## 2016-06-23 NOTE — CV Procedure (Signed)
    Transesophageal Echocardiogram Note  NICKELLE BARMAN [id number] 10-24-1965  Procedure: Transesophageal Echocardiogram Indications: atrial fib,  eval for LAA thrombus  Procedure Details Consent: Obtained Time Out: Verified patient identification, verified procedure, site/side was marked, verified correct patient position, special equipment/implants available, Radiology Safety Procedures followed,  medications/allergies/relevent history reviewed, required imaging and test results available.  Performed  Medications:  Anesthesia admisistered Propofol drip throught out the case.   She received 183 mg total.  Left Ventrical:  Moderately reduced LV function   Mitral Valve: moderate MR   Aortic Valve: normal   Tricuspid Valve: mild TR   Pulmonic Valve: normal   Left Atrium/ Left atrial appendage: there is extensive spontansous contrast ( "smoke") with some organized density that  Is concerning for  thrombus.   Atrial septum: no ASD or PFO by color flow  Aorta: normal .    Complications: No apparent complications Patient did tolerate procedure well.  We did not perform a cardioversion because of the extensive spontaneous contrast and concern for thrombus in the LAA  Thayer Headings, Brooke Bonito., MD, [facility] 06/23/2016, 11:26 AM

## 2016-06-23 NOTE — Transfer of Care (Signed)
Immediate Anesthesia Transfer of Care Note  Patient: Kathleen Wright  Procedure(s) Performed: Procedure(s): TRANSESOPHAGEAL ECHOCARDIOGRAM (TEE) (N/A)  Patient Location: Endoscopy Unit  Anesthesia Type:MAC  Level of Consciousness: awake, oriented and patient cooperative  Airway & Oxygen Therapy: Patient Spontanous Breathing and Patient connected to nasal cannula oxygen  Post-op Assessment: Report given to RN, Post -op Vital signs reviewed and stable and Patient moving all extremities  Post vital signs: Reviewed and stable  Last Vitals:  Vitals:   06/23/16 0944  BP: 124/76  Pulse: 68  Resp: 15  Temp: 36.6 C    Last Pain:  Vitals:   06/23/16 0944  TempSrc: Oral         Complications: No apparent anesthesia complications

## 2016-06-23 NOTE — Interval H&P Note (Signed)
History and Physical Interval Note:  06/23/2016 10:17 AM  [name]  has presented today for surgery, with the diagnosis of AFIB  The various methods of treatment have been discussed with the patient and family. After consideration of risks, benefits and other options for treatment, the patient has consented to  Procedure(s): CARDIOVERSION (N/A) TRANSESOPHAGEAL ECHOCARDIOGRAM (TEE) (N/A) as a surgical intervention .  The patient's history has been reviewed, patient examined, no change in status, stable for surgery.  I have reviewed the patient's chart and labs.  Questions were answered to the patient's satisfaction.     [name]

## 2016-06-23 NOTE — Anesthesia Postprocedure Evaluation (Signed)
Anesthesia Post Note  Patient: Kathleen Wright  Procedure(s) Performed: Procedure(s) (LRB): TRANSESOPHAGEAL ECHOCARDIOGRAM (TEE) (N/A)  Patient location during evaluation: PACU Anesthesia Type: MAC Level of consciousness: awake and alert Pain management: pain level controlled Vital Signs Assessment: post-procedure vital signs reviewed and stable Respiratory status: spontaneous breathing, nonlabored ventilation, respiratory function stable and patient connected to nasal cannula oxygen Cardiovascular status: stable and blood pressure returned to baseline Anesthetic complications: no       Last Vitals:  Vitals:   06/23/16 1150 06/23/16 1156  BP: 107/76 117/64  Pulse: 76 63  Resp: 15 13  Temp:      Last Pain:  Vitals:   06/23/16 1136  TempSrc: Oral                 Tiajuana Amass

## 2016-06-24 ENCOUNTER — Encounter (HOSPITAL_COMMUNITY): Payer: Self-pay | Admitting: Cardiovascular Disease

## 2016-06-30 ENCOUNTER — Ambulatory Visit: Payer: Medicare Other | Admitting: Internal Medicine

## 2016-07-06 ENCOUNTER — Ambulatory Visit (INDEPENDENT_AMBULATORY_CARE_PROVIDER_SITE_OTHER): Payer: Medicare Other | Admitting: Internal Medicine

## 2016-07-06 VITALS — BP 112/84 | HR 90 | Ht 61.0 in | Wt 215.6 lb

## 2016-07-06 DIAGNOSIS — I4891 Unspecified atrial fibrillation: Secondary | ICD-10-CM | POA: Diagnosis not present

## 2016-07-06 DIAGNOSIS — I421 Obstructive hypertrophic cardiomyopathy: Secondary | ICD-10-CM

## 2016-07-06 DIAGNOSIS — I4819 Other persistent atrial fibrillation: Secondary | ICD-10-CM

## 2016-07-06 DIAGNOSIS — Z9889 Other specified postprocedural states: Secondary | ICD-10-CM

## 2016-07-06 DIAGNOSIS — I481 Persistent atrial fibrillation: Secondary | ICD-10-CM

## 2016-07-06 DIAGNOSIS — I484 Atypical atrial flutter: Secondary | ICD-10-CM | POA: Diagnosis not present

## 2016-07-06 DIAGNOSIS — Z720 Tobacco use: Secondary | ICD-10-CM

## 2016-07-06 NOTE — Patient Instructions (Signed)
Medication Instructions: Your physician recommends that you continue on your current medications as directed. Please refer to the Current Medication list given to you today.   Labwork: None Ordered  Procedures/Testing: None Ordered  Follow-Up: Your physician recommends that you schedule a follow-up appointment in: 3 MONTHS with Dr. [name].  Any Additional Special Instructions Will Be Listed Below (If Applicable).     If you need a refill on your cardiac medications before your next appointment, please call your pharmacy.

## 2016-07-07 NOTE — Progress Notes (Signed)
Electrophysiology Office Note   Date:  07/07/2016   ID:  Kathleen Wright, DOB [birth year], MRN [id number]  PCP:  [name]., MD  Cardiologist:  Dr [name] Primary Electrophysiologist: [name], MD    CC: afib   History of Present Illness: Kathleen Wright is a 51 y.o. female who presents today for electrophysiology follow-up.   The patient has atrial fibrillation and atypical atrial flutter.  She has a h/o HCM and is s/p septal myomectomy at [facility] in 2006.  She had post operative AF and was placed on amiodarone.  This was discontinued due to elevated LFTs.  She did well but has recently developed recurrent atrial arrhythmias.  She has had multiple episodes of atypical atrial flutter since 12/16 for which she has had several hospitalizations and has required cardioversions.    She reports symptoms of tachypalpitations and fatigue with her atrial arrhythmias.  She presented for EPS and RFA by me but had LAA thrombus.  Ablation was therefore not performed.  She was sent to [facility] for evaluation in the Hypertrophic CM clinic.  She was recently seen by Dr Mina Marble.  He has recommended addition of ASA to her pradaxa.  He recommended follow-up TEE followed by initiation of sotalol.   Unfortunately, recent repeat TEE revealed persistence of LAA thrombus.  She has a markedly enlarged LA with smoke and resolving but persistent thrombus.  She therefore was not cardioverted and did not start sotalol. Today, she denies symptoms of  chest pain, shortness of breath, orthopnea, PND, lower extremity edema, claudication, dizziness, presyncope, syncope, bleeding, or neurologic sequela. The patient is tolerating medications without difficulties and is otherwise without complaint today.    Past Medical History:  Diagnosis Date  . Asthma   . Cervical cancer ([place])    a. treated with partial removal of cervix  . COPD (chronic obstructive pulmonary disease) ([place])   . Hypertrophic cardiomyopathy ([place])    a.  s/p myomectomy at [facility] 08/2004  . Paroxysmal atrial flutter ([facility])    a. post-op after myomectomy in 2006. s/p TEE/DCCV.  . Transient atrial fibrillation ([place]) 2006   a. after myomectomy at [facility] in 08/2004 - was on amiodarone but developed elevated LFTs felt possibly secondary to amiodarone    Past Surgical History:  Procedure Laterality Date  . CARDIOVERSION N/A 08/12/2015   Procedure: CARDIOVERSION;  Surgeon: Satira Sark, MD;  Location[facility]O [facility];  Service: Cardiovascular;  Laterality: N/A;  . Septal myomectomy  2006   [facility]  . TEE WITHOUT CARDIOVERSION N/A 03/29/2016   Procedure: TRANSESOPHAGEAL ECHOCARDIOGRAM (TEE);  Surgeon: Lelon Perla, MD;  Location: [facility] ENDOSCOPY;  Service: Cardiovascular;  Laterality: N/A;  . TEE WITHOUT CARDIOVERSION N/A 06/23/2016   Procedure: TRANSESOPHAGEAL ECHOCARDIOGRAM (TEE);  Surgeon: Thayer Headings, MD;  Location: [place];  Service: Cardiovascular;  Laterality: N/A;  . TUBAL LIGATION       Current Outpatient Prescriptions  Medication Sig Dispense Refill  . ALPRAZolam (XANAX) 1 MG tablet Take 1 mg by mouth 3 (three) times daily as needed for anxiety.     Marland Kitchen aspirin EC 81 MG tablet Take 81 mg by mouth daily.    . dabigatran (PRADAXA) 150 MG CAPS capsule Take 1 capsule (150 mg total) by mouth 2 (two) times daily. 180 capsule 3  . diltiazem (CARDIZEM CD) 300 MG 24 hr capsule Take 1 capsule (300 mg total) by mouth daily. 30 capsule 0  . furosemide (LASIX) 20 MG tablet Take 20-60 mg by  mouth daily as needed for fluid or edema (depends on how much swelling if patient takes 20-60 mg). Takes potassium with furosemide.    Marland Kitchen HYDROcodone-acetaminophen (NORCO) 10-325 MG tablet Take 1 tablet by mouth every 6 (six) hours as needed for moderate pain or severe pain.     Marland Kitchen ibuprofen (ADVIL,MOTRIN) 800 MG tablet Take 800-1,600 mg by mouth 3 (three) times daily as needed for moderate pain.     Marland Kitchen levalbuterol (XOPENEX) 0.63 MG/3ML nebulizer solution Take 0.63 mg  by nebulization every 6 (six) hours as needed for wheezing or shortness of breath.     . metoprolol tartrate (LOPRESSOR) 25 MG tablet TAKE 2 TABLETS BY MOUTH TWICE DAILY. 120 tablet 10  . mupirocin cream (BACTROBAN) 2 % Apply 1 application topically daily as needed (wound care).    . potassium chloride SA (K-DUR,KLOR-CON) 20 MEQ tablet Take 1 tablet by mouth daily as needed. When you take Lasix    . PROAIR HFA 108 (90 Base) MCG/ACT inhaler Inhale 1-2 puffs into the lungs every 6 (six) hours as needed for wheezing or shortness of breath.      No current facility-administered medications for this visit.     Allergies:   Amiodarone   Social History:  The patient  reports that she quit smoking about 3 months ago. Her smoking use included Cigarettes. She smoked 0.50 packs per day. She has never used smokeless tobacco. She reports that she does not drink alcohol or use drugs.   Family History:  The patient's  family history includes Heart failure in her father.    ROS:  Please see the history of present illness.   All other systems are reviewed and negative.    PHYSICAL EXAM: VS:  BP 112/84   Pulse 90   Ht 5\' 1"  (1.549 m)   Wt 215 lb 9.6 oz (97.8 kg)   SpO2 98%   BMI 40.74 kg/m  , BMI Body mass index is 40.74 kg/m. GEN: Well nourished, well developed, in no acute distress  HEENT: normal  Neck: no JVD, carotid bruits, or masses Cardiac: iRRR; no murmurs, rubs, or gallops,no edema  Respiratory:  clear to auscultation bilaterally, normal work of breathing GI: soft, nontender, nondistended, + BS MS: no deformity or atrophy  Skin: warm and dry  Neuro:  Strength and sensation are intact Psych: euthymic mood, full affect  EKG:  EKG is ordered today. The ekg ordered today shows afib with V rates82 bpm, nonspecific ST/T changes   Recent Labs: 08/10/2015: TSH 1.570 08/12/2015: ALT 24 03/22/2016: Hemoglobin 13.4 06/16/2016: BUN 14; Creatinine, Ser 1.04; Magnesium 1.9; Platelets 283;  Potassium 4.5; Sodium 143    Lipid Panel  No results found for: CHOL, TRIG, HDL, CHOLHDL, VLDL, LDLCALC, LDLDIRECT   Wt Readings from Last 3 Encounters:  07/06/16 215 lb 9.6 oz (97.8 kg)  06/23/16 211 lb (95.7 kg)  06/01/16 211 lb (95.7 kg)      Other studies Reviewed: Additional studies/ records that were reviewed today include: [facility] records   ASSESSMENT AND PLAN:  1.  Atypical atrial flutter/ atrial fibrillation The patient has symptomatic atrial arrhythmias.  Ablation has been limited by LAA thrombus.  She has seen Dr Mina Marble who has added ASA to pradaxa.  Her thrombus persists.  I have spoken with Dr Dwana Curd at [facility].  We discussed hematology consultation and possible [facility] EP referral. I discussed these options with the patient.  At this time, she is not interested in any further procedures or  consultations.  She would prefer to continue her current medicines and rate control.  She will return in 3 months.  She may be more willing to consider referral at that time.  Cardiac CT to reassess thrombus would also be an option at that time. I worry that long term our ability to achieve/ maintain sinus rhythm is low.  2. Tobacco She remains quit!   Today, I have spent 25 minutes with the patient discussing the above .  More than 50% of the visit time today was spent on this issue.  Signed, [name], MD   [place] [address] [phone] (office) [phone] (fax)

## 2016-08-02 IMAGING — DX DG CHEST 2V
2 series · 2 of 2 positions shown · non-contrast
Comparison: Prior radiograph from 08/10/2015.

CLINICAL DATA: Initial evaluation for acute palpitations.

EXAM:
CHEST  2 VIEW

[chest pa]
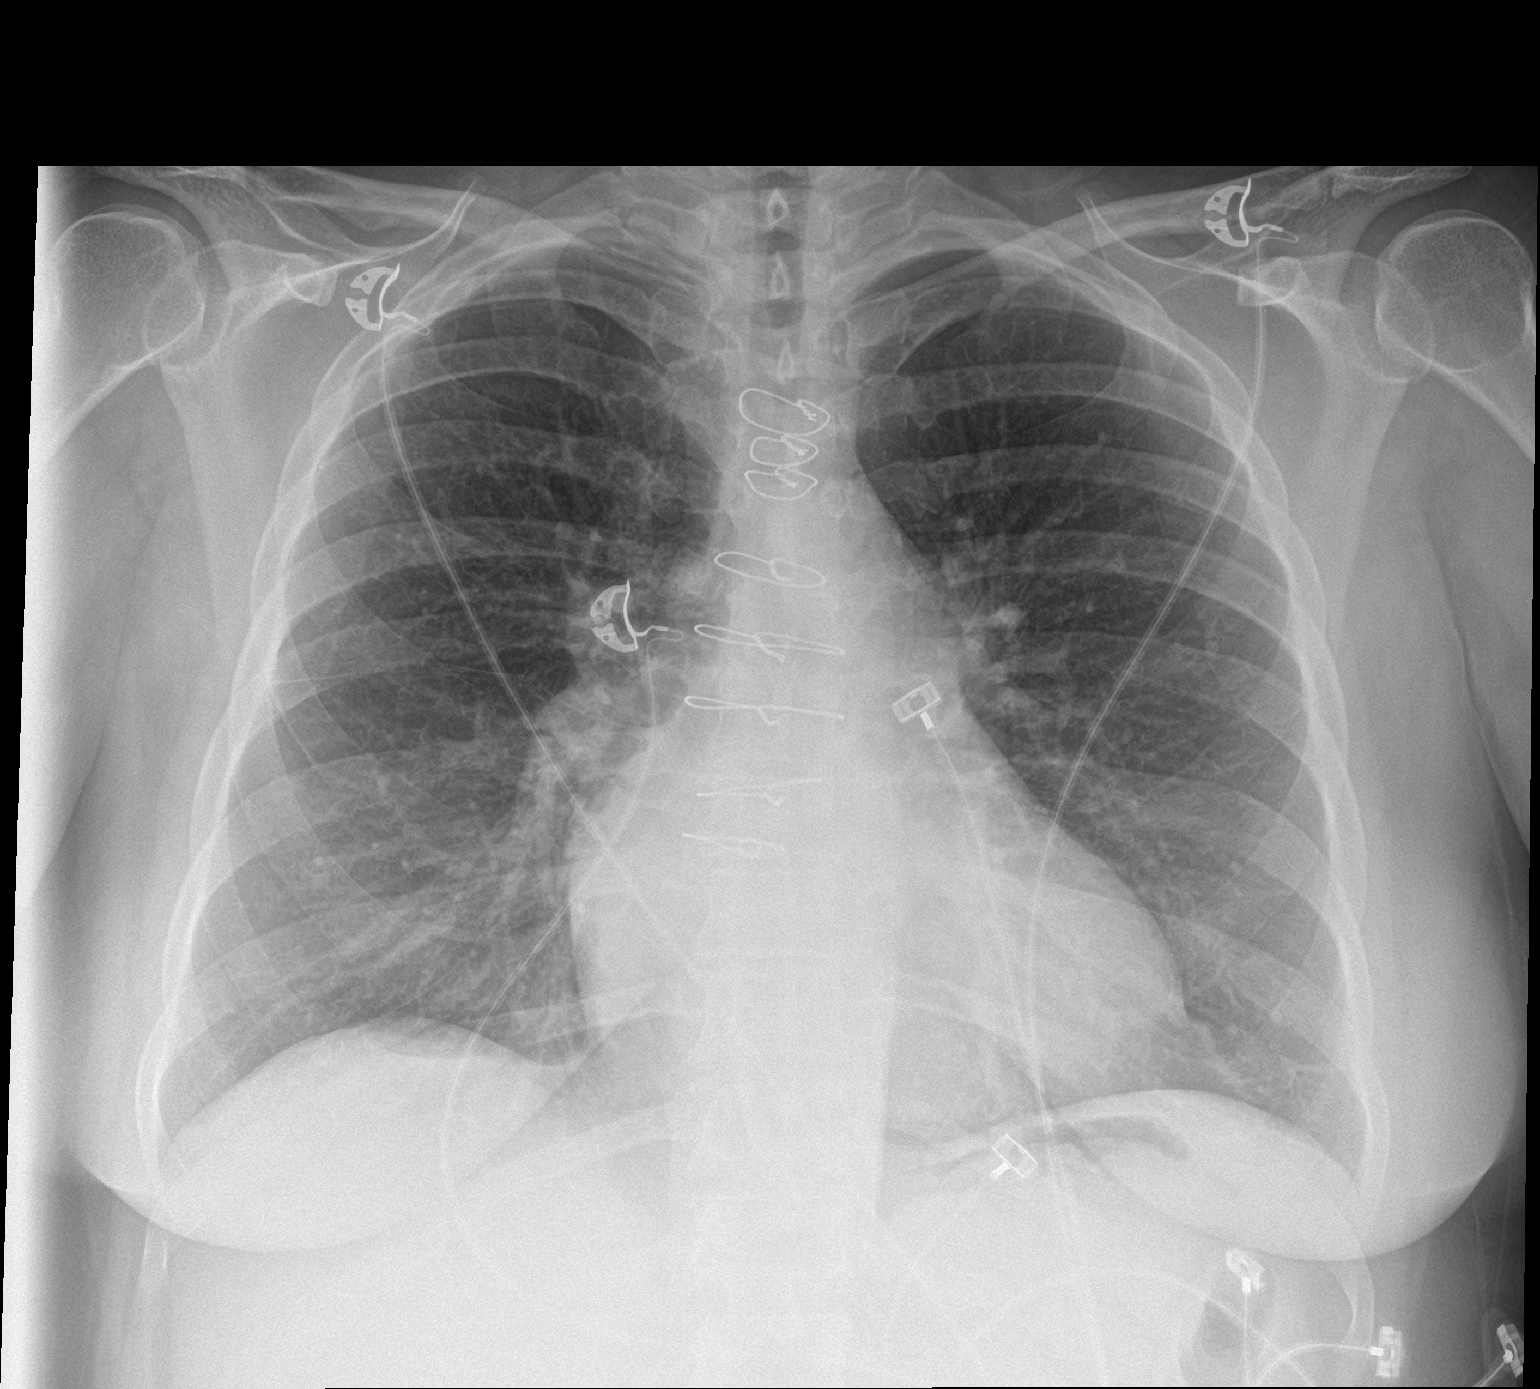

[chest lat]
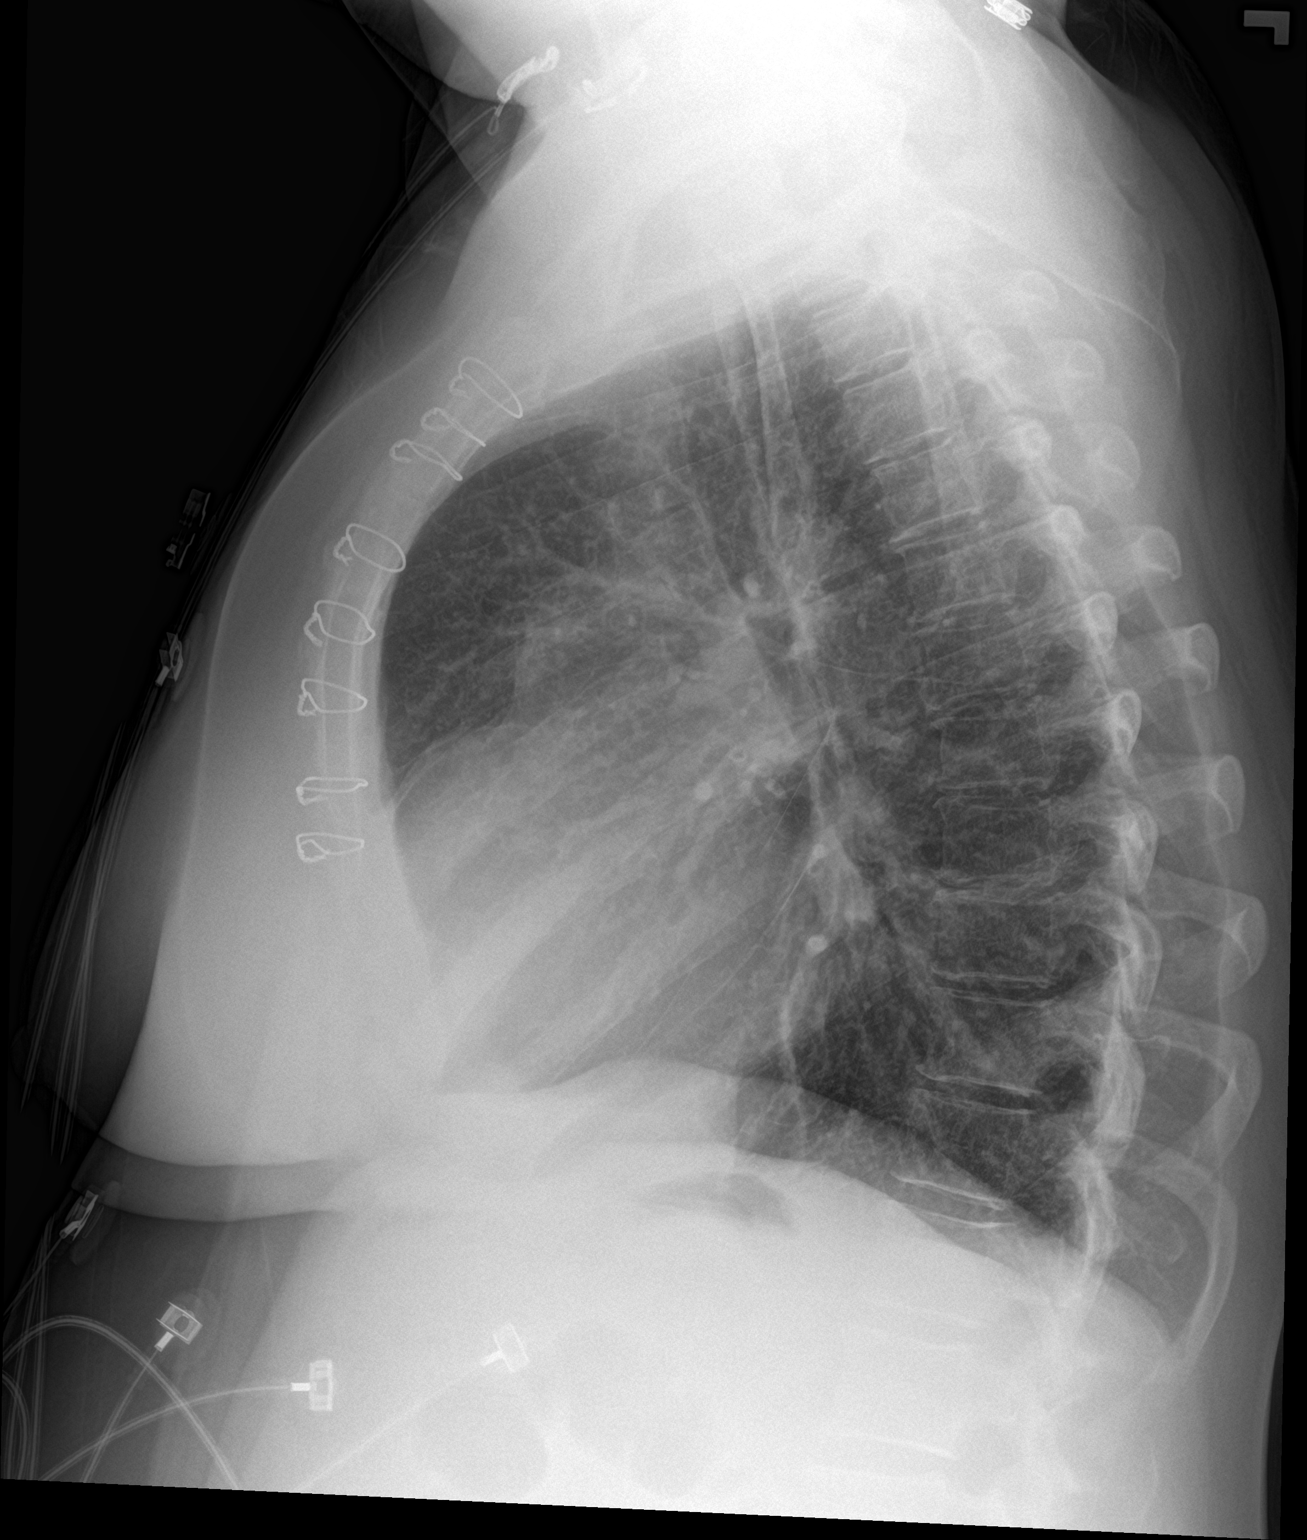

[2 of 2 positions shown; findings below may reference images not displayed]

FINDINGS: Median sternotomy wires noted. Cardiomegaly is stable. Mediastinal
silhouette within normal limits.

Lungs normally inflated. No focal infiltrate, pulmonary edema, or
pleural effusion. No pneumothorax.

No acute osseus abnormality.
IMPRESSION: Stable appearance of the chest. No active cardiopulmonary disease
identified.

## 2016-08-04 DIAGNOSIS — J449 Chronic obstructive pulmonary disease, unspecified: Secondary | ICD-10-CM | POA: Diagnosis not present

## 2016-08-04 DIAGNOSIS — Z1389 Encounter for screening for other disorder: Secondary | ICD-10-CM | POA: Diagnosis not present

## 2016-08-04 DIAGNOSIS — R21 Rash and other nonspecific skin eruption: Secondary | ICD-10-CM | POA: Diagnosis not present

## 2016-08-04 DIAGNOSIS — I519 Heart disease, unspecified: Secondary | ICD-10-CM | POA: Diagnosis not present

## 2016-09-19 ENCOUNTER — Encounter: Payer: Self-pay | Admitting: Internal Medicine

## 2016-10-05 ENCOUNTER — Encounter: Payer: Self-pay | Admitting: Internal Medicine

## 2016-10-05 ENCOUNTER — Ambulatory Visit (INDEPENDENT_AMBULATORY_CARE_PROVIDER_SITE_OTHER): Payer: Medicare Other | Admitting: Internal Medicine

## 2016-10-05 VITALS — BP 124/72 | HR 72 | Ht 61.0 in | Wt 232.4 lb

## 2016-10-05 DIAGNOSIS — I484 Atypical atrial flutter: Secondary | ICD-10-CM | POA: Diagnosis not present

## 2016-10-05 DIAGNOSIS — I5033 Acute on chronic diastolic (congestive) heart failure: Secondary | ICD-10-CM

## 2016-10-05 DIAGNOSIS — Z720 Tobacco use: Secondary | ICD-10-CM | POA: Diagnosis not present

## 2016-10-05 DIAGNOSIS — I4819 Other persistent atrial fibrillation: Secondary | ICD-10-CM

## 2016-10-05 DIAGNOSIS — I481 Persistent atrial fibrillation: Secondary | ICD-10-CM | POA: Diagnosis not present

## 2016-10-05 DIAGNOSIS — I421 Obstructive hypertrophic cardiomyopathy: Secondary | ICD-10-CM | POA: Diagnosis not present

## 2016-10-05 MED ORDER — FUROSEMIDE 80 MG PO TABS: 80.0000 mg | ORAL_TABLET | Freq: Every day | ORAL | 3 refills | Status: DC

## 2016-10-05 NOTE — Progress Notes (Signed)
Electrophysiology Office Note   Date:  10/05/2016   ID:  Kathleen Wright, Kathleen Wright June 01, 1966, MRN [id number]  PCP:  [name], MD  Cardiologist:  Dr [name] Primary Electrophysiologist: [name], MD    CC: afib   History of Present Illness: Kathleen Wright is a 51 y.o. female who presents today for electrophysiology follow-up.   The patient has atrial fibrillation and atypical atrial flutter.  She has a h/o HCM and is s/p septal myomectomy at [facility] in 2006.  She has had multiple episodes of atypical atrial flutter since 12/16 for which she has had several hospitalizations and has required cardioversions.   She presented for EPS and RFA by me but had LAA thrombus.  Ablation was therefore not performed.  She was sent to [facility] for evaluation in the Hypertrophic CM clinic and seen by Dr Mina Marble.  He has recommended addition of ASA to her pradaxa.  Unfortunately, recent repeat TEE revealed persistence of LAA thrombus.  She has a markedly enlarged LA with smoke and resolving but persistent thrombus.   She continues be in an atypical atrial flutter.  Though rates are controlled, she is quite symptomatic with SOB and worsening edema.  Today, she denies symptoms of  chest pain,  orthopnea, PND,  claudication, dizziness, presyncope, syncope, bleeding, or neurologic sequela. The patient is tolerating medications without difficulties and is otherwise without complaint today.    Past Medical History:  Diagnosis Date  . Asthma   . Cervical cancer ([place])    a. treated with partial removal of cervix  . COPD (chronic obstructive pulmonary disease) ([place])   . Hypertrophic cardiomyopathy ([place])    a. s/p myomectomy at [facility] 08/2004  . Paroxysmal atrial flutter (H[facility])    a. post-op after myomectomy in 2006. s/p TEE/DCCV.  . Transient atrial fibrillation ([place]) 2006   a. after myomectomy at [facility] in 08/2004 - was on amiodarone but developed elevated LFTs felt possibly secondary to amiodarone    Past Surgical  History:  Procedure Laterality Date  . CARDIOVERSION N/A 08/12/2015   Procedure: CARDIOVERSION;  Surgeon: Satira Sark, MD;  [facility];  Service: Cardiovascular;  Laterality: N/A;  . Septal myomectomy  2006   [facility]  . TEE WITHOUT CARDIOVERSION N/A 03/29/2016   Procedure: TRANSESOPHAGEAL ECHOCARDIOGRAM (TEE);  Surgeon: Lelon Perla, MD;  Location: [facility] ENDOSCOPY;  Service: Cardiovascular;  Laterality: N/A;  . TEE WITHOUT CARDIOVERSION N/A 06/23/2016   Procedure: TRANSESOPHAGEAL ECHOCARDIOGRAM (TEE);  Surgeon: Thayer Headings, MD;  Location: [facility];  Service: Cardiovascular;  Laterality: N/A;  . TUBAL LIGATION       Current Outpatient Prescriptions  Medication Sig Dispense Refill  . ALPRAZolam (XANAX) 1 MG tablet Take 1 mg by mouth 3 (three) times daily as needed for anxiety.     Marland Kitchen aspirin EC 81 MG tablet Take 81 mg by mouth daily.    . dabigatran (PRADAXA) 150 MG CAPS capsule Take 1 capsule (150 mg total) by mouth 2 (two) times daily. 180 capsule 3  . diltiazem (CARDIZEM CD) 300 MG 24 hr capsule Take 1 capsule (300 mg total) by mouth daily. 30 capsule 0  . furosemide (LASIX) 20 MG tablet Take 20-60 mg by mouth daily as needed for fluid or edema (depends on how much swelling if patient takes 20-60 mg). Takes potassium with furosemide.    Marland Kitchen HYDROcodone-acetaminophen (NORCO) 10-325 MG tablet Take 1 tablet by mouth every 6 (six) hours as needed for moderate pain or  severe pain.     Marland Kitchen ibuprofen (ADVIL,MOTRIN) 800 MG tablet Take 800-1,600 mg by mouth 3 (three) times daily as needed for moderate pain.     Marland Kitchen levalbuterol (XOPENEX) 0.63 MG/3ML nebulizer solution Take 0.63 mg by nebulization every 6 (six) hours as needed for wheezing or shortness of breath.     . metoprolol tartrate (LOPRESSOR) 25 MG tablet TAKE 2 TABLETS BY MOUTH TWICE DAILY. 120 tablet 10  . mupirocin cream (BACTROBAN) 2 % Apply 1 application topically daily as needed (wound care).    . potassium chloride SA  (K-DUR,KLOR-CON) 20 MEQ tablet Take 1 tablet by mouth daily as needed. When you take Lasix    . PROAIR HFA 108 (90 Base) MCG/ACT inhaler Inhale 1-2 puffs into the lungs every 6 (six) hours as needed for wheezing or shortness of breath.      No current facility-administered medications for this visit.     Allergies:   Amiodarone   Social History:  The patient  reports that she quit smoking about 6 months ago. Her smoking use included Cigarettes. She smoked 0.50 packs per day. She has never used smokeless tobacco. She reports that she does not drink alcohol or use drugs.   Family History:  The patient's  family history includes Heart failure in her father.    ROS:  Please see the history of present illness.   All other systems are reviewed and negative.    PHYSICAL EXAM: VS:  BP 124/72   Pulse 72   Ht 5\' 1"  (1.549 m)   Wt 232 lb 6.4 oz (105.4 kg)   SpO2 94%   BMI 43.91 kg/m  , BMI Body mass index is 43.91 kg/m. GEN: Well nourished, well developed, in no acute distress  HEENT: normal  Neck: no JVD, carotid bruits, or masses Cardiac: iRRR; no murmurs, rubs, or gallops,+1 edema  Respiratory:  Diffuse expiratory wheezes, normal work of breathing GI: soft, nontender, nondistended, + BS MS: no deformity or atrophy  Skin: warm and dry  Neuro:  Strength and sensation are intact Psych: euthymic mood, full affect  EKG:  EKG is ordered today. The ekg ordered today shows atypcial atrial flutter, IVCD   Recent Labs: 03/22/2016: Hemoglobin 13.4 06/16/2016: BUN 14; Creatinine, Ser 1.04; Magnesium 1.9; Platelets 283; Potassium 4.5; Sodium 143    Lipid Panel  No results found for: CHOL, TRIG, HDL, CHOLHDL, VLDL, LDLCALC, LDLDIRECT   Wt Readings from Last 3 Encounters:  10/05/16 232 lb 6.4 oz (105.4 kg)  07/06/16 215 lb 9.6 oz (97.8 kg)  06/23/16 211 lb (95.7 kg)      ASSESSMENT AND PLAN:  1.  Atypical atrial flutter/ atrial fibrillation The patient has symptomatic atrial  arrhythmias.  Ablation has been limited by LAA thrombus.  She has seen Dr Mina Marble who has added ASA to pradaxa.  Her thrombus persists.  I have spoken with Dr Dwana Curd at [facility] previously and we have discussed [facility] EP referral. The patient is now willing to go to [facility] for further evaluation and management of her atypical atrial flutter and atrial fibrillation.    2. Tobacco She remains quit!  3. SOB Likely multifactorial I have advised that she follow-up with Dr Luan Pulling (Pulmonary) for management Increase lasix (below)  4. Acute on chronic diastolic dysfunction Volume overload is likely due to atrial arrhythmias See #1 above 2 gram sodium restriction Increase lasix to 80mg  daily bmet today  5. Hypertrophic CM Previously followed by Dr Jacelyn Grip at [facility] No changes  today  Signed, [name], MD   [place] [address] [phone] (office) [phone] (fax)

## 2016-10-05 NOTE — Patient Instructions (Addendum)
Medication Instructions:  Your physician has recommended you make the following change in your medication:  1) Increase Furosemide to 80 mg daily   Labwork: Your physician recommends that you have lab work today :BMP   Testing/Procedures: None ordered   Follow-Up: Your physician recommends that you schedule a follow-up appointment in: 3 months with Dr [name]  You have been referred to Dr. Dwana Curd at [facility] afib and atypical atrial flutter   Call Dr Luan Pulling and get a follow up    Any Other Special Instructions Will Be Listed Below (If Applicable).   Low-Sodium Eating Plan Sodium, which is an element that makes up salt, helps you maintain a healthy balance of fluids in your body. Too much sodium can increase your blood pressure and cause fluid and waste to be held in your body. Your health care provider or dietitian may recommend following this plan if you have high blood pressure (hypertension), kidney disease, liver disease, or heart failure. Eating less sodium can help lower your blood pressure, reduce swelling, and protect your heart, liver, and kidneys. What are tips for following this plan? General guidelines   Most people on this plan should limit their sodium intake to 2,000 mg (milligrams) of sodium each day. Reading food labels   The Nutrition Facts label lists the amount of sodium in one serving of the food. If you eat more than one serving, you must multiply the listed amount of sodium by the number of servings.  Choose foods with less than 140 mg of sodium per serving.  Avoid foods with 300 mg of sodium or more per serving. Shopping   Look for lower-sodium products, often labeled as "low-sodium" or "no salt added."  Always check the sodium content even if foods are labeled as "unsalted" or "no salt added".  Buy fresh foods.  Avoid canned foods and premade or frozen meals.  Avoid canned, cured, or processed meats  Buy breads that have less than 80 mg of  sodium per slice. Cooking   Eat more home-cooked food and less restaurant, buffet, and fast food.  Avoid adding salt when cooking. Use salt-free seasonings or herbs instead of table salt or sea salt. Check with your health care provider or pharmacist before using salt substitutes.  Cook with plant-based oils, such as canola, sunflower, or olive oil. Meal planning   When eating at a restaurant, ask that your food be prepared with less salt or no salt, if possible.  Avoid foods that contain MSG (monosodium glutamate). MSG is sometimes added to [place] food, bouillon, and some canned foods. What foods are recommended? The items listed may not be a complete list. Talk with your dietitian about what dietary choices are best for you. Grains  Low-sodium cereals, including oats, puffed wheat and rice, and shredded wheat. Low-sodium crackers. Unsalted rice. Unsalted pasta. Low-sodium bread. Whole-grain breads and whole-grain pasta. Vegetables  Fresh or frozen vegetables. "No salt added" canned vegetables. "No salt added" tomato sauce and paste. Low-sodium or reduced-sodium tomato and vegetable juice. Fruits  Fresh, frozen, or canned fruit. Fruit juice. Meats and other protein foods  Fresh or frozen (no salt added) meat, poultry, seafood, and fish. Low-sodium canned tuna and salmon. Unsalted nuts. Dried peas, beans, and lentils without added salt. Unsalted canned beans. Eggs. Unsalted nut butters. Dairy  Milk. Soy milk. Cheese that is naturally low in sodium, such as ricotta cheese, fresh mozzarella, or Swiss cheese Low-sodium or reduced-sodium cheese. Cream cheese. Yogurt. Fats and oils  Unsalted butter.  Unsalted margarine with no trans fat. Vegetable oils such as canola or olive oils. Seasonings and other foods  Fresh and dried herbs and spices. Salt-free seasonings. Low-sodium mustard and ketchup. Sodium-free salad dressing. Sodium-free light mayonnaise. Fresh or refrigerated horseradish. Lemon  juice. Vinegar. Homemade, reduced-sodium, or low-sodium soups. Unsalted popcorn and pretzels. Low-salt or salt-free chips. What foods are not recommended? The items listed may not be a complete list. Talk with your dietitian about what dietary choices are best for you. Grains  Instant hot cereals. Bread stuffing, pancake, and biscuit mixes. Croutons. Seasoned rice or pasta mixes. Noodle soup cups. Boxed or frozen macaroni and cheese. Regular salted crackers. Self-rising flour. Vegetables  Sauerkraut, pickled vegetables, and relishes. Olives. [place] fries. Onion rings. Regular canned vegetables (not low-sodium or reduced-sodium). Regular canned tomato sauce and paste (not low-sodium or reduced-sodium). Regular tomato and vegetable juice (not low-sodium or reduced-sodium). Frozen vegetables in sauces. Meats and other protein foods  Meat or fish that is salted, canned, smoked, spiced, or pickled. Bacon, ham, sausage, hotdogs, corned beef, chipped beef, packaged lunch meats, salt pork, jerky, pickled herring, anchovies, regular canned tuna, sardines, salted nuts. Dairy  Processed cheese and cheese spreads. Cheese curds. Blue cheese. Feta cheese. String cheese. Regular cottage cheese. Buttermilk. Canned milk. Fats and oils  Salted butter. Regular margarine. Ghee. Bacon fat. Seasonings and other foods  Onion salt, garlic salt, seasoned salt, table salt, and sea salt. Canned and packaged gravies. Worcestershire sauce. Tartar sauce. Barbecue sauce. Teriyaki sauce. Soy sauce, including reduced-sodium. Steak sauce. Fish sauce. Oyster sauce. Cocktail sauce. Horseradish that you find on the shelf. Regular ketchup and mustard. Meat flavorings and tenderizers. Bouillon cubes. Hot sauce and Tabasco sauce. Premade or packaged marinades. Premade or packaged taco seasonings. Relishes. Regular salad dressings. Salsa. Potato and tortilla chips. Corn chips and puffs. Salted popcorn and pretzels. Canned or dried soups.  Pizza. Frozen entrees and pot pies. Summary  Eating less sodium can help lower your blood pressure, reduce swelling, and protect your heart, liver, and kidneys.  Most people on this plan should limit their sodium intake to 1,500-2,000 mg (milligrams) of sodium each day.  Canned, boxed, and frozen foods are high in sodium. Restaurant foods, fast foods, and pizza are also very high in sodium. You also get sodium by adding salt to food.  Try to cook at home, eat more fresh fruits and vegetables, and eat less fast food, canned, processed, or prepared foods. This information is not intended to replace advice given to you by your health care provider. Make sure you discuss any questions you have with your health care provider. Document Released: 11/19/2001 Document Revised: 05/23/2016 Document Reviewed: 05/23/2016 Elsevier Interactive Patient Education  2017 [employer].      If you need a refill on your cardiac medications before your next appointment, please call your pharmacy.

## 2016-10-06 LAB — BASIC METABOLIC PANEL
BUN/Creatinine Ratio: 25 — ABNORMAL HIGH (ref 9–23)
BUN: 22 mg/dL (ref 6–24)
CO2: 23 mmol/L (ref 18–29)
CREATININE: 0.88 mg/dL (ref 0.57–1.00)
Calcium: 9.1 mg/dL (ref 8.7–10.2)
Chloride: 101 mmol/L (ref 96–106)
GFR calc Af Amer: 87 mL/min/{1.73_m2} (ref >59)
GFR, EST NON AFRICAN AMERICAN: 76 mL/min/{1.73_m2} (ref >59)
Glucose: 79 mg/dL (ref 65–99)
Potassium: 4.7 mmol/L (ref 3.5–5.2)
Sodium: 141 mmol/L (ref 134–144)

## 2016-10-07 NOTE — Addendum Note (Signed)
Addended by: Janan Halter F on: 10/07/2016 12:15 PM   Modules accepted: Orders

## 2016-11-01 DIAGNOSIS — I481 Persistent atrial fibrillation: Secondary | ICD-10-CM | POA: Diagnosis not present

## 2016-11-01 DIAGNOSIS — I480 Paroxysmal atrial fibrillation: Secondary | ICD-10-CM | POA: Diagnosis not present

## 2016-11-01 DIAGNOSIS — I422 Other hypertrophic cardiomyopathy: Secondary | ICD-10-CM | POA: Diagnosis not present

## 2016-11-04 DIAGNOSIS — G894 Chronic pain syndrome: Secondary | ICD-10-CM | POA: Diagnosis not present

## 2016-11-04 DIAGNOSIS — Z1389 Encounter for screening for other disorder: Secondary | ICD-10-CM | POA: Diagnosis not present

## 2016-11-28 DIAGNOSIS — I480 Paroxysmal atrial fibrillation: Secondary | ICD-10-CM | POA: Diagnosis not present

## 2016-11-28 DIAGNOSIS — J449 Chronic obstructive pulmonary disease, unspecified: Secondary | ICD-10-CM | POA: Diagnosis not present

## 2016-11-28 DIAGNOSIS — R918 Other nonspecific abnormal finding of lung field: Secondary | ICD-10-CM | POA: Diagnosis not present

## 2016-11-28 DIAGNOSIS — I481 Persistent atrial fibrillation: Secondary | ICD-10-CM | POA: Diagnosis not present

## 2016-11-28 DIAGNOSIS — Z7901 Long term (current) use of anticoagulants: Secondary | ICD-10-CM | POA: Diagnosis not present

## 2016-11-28 DIAGNOSIS — I517 Cardiomegaly: Secondary | ICD-10-CM | POA: Diagnosis not present

## 2016-11-28 DIAGNOSIS — Z9889 Other specified postprocedural states: Secondary | ICD-10-CM | POA: Diagnosis not present

## 2016-12-08 DIAGNOSIS — I10 Essential (primary) hypertension: Secondary | ICD-10-CM | POA: Diagnosis not present

## 2016-12-08 DIAGNOSIS — I4891 Unspecified atrial fibrillation: Secondary | ICD-10-CM | POA: Diagnosis not present

## 2016-12-08 DIAGNOSIS — G894 Chronic pain syndrome: Secondary | ICD-10-CM | POA: Diagnosis not present

## 2016-12-20 DIAGNOSIS — Z8679 Personal history of other diseases of the circulatory system: Secondary | ICD-10-CM | POA: Diagnosis not present

## 2016-12-20 DIAGNOSIS — I071 Rheumatic tricuspid insufficiency: Secondary | ICD-10-CM | POA: Diagnosis not present

## 2016-12-20 DIAGNOSIS — J449 Chronic obstructive pulmonary disease, unspecified: Secondary | ICD-10-CM | POA: Diagnosis not present

## 2016-12-20 DIAGNOSIS — Z7901 Long term (current) use of anticoagulants: Secondary | ICD-10-CM | POA: Diagnosis not present

## 2016-12-20 DIAGNOSIS — I481 Persistent atrial fibrillation: Secondary | ICD-10-CM | POA: Diagnosis not present

## 2016-12-20 DIAGNOSIS — I340 Nonrheumatic mitral (valve) insufficiency: Secondary | ICD-10-CM | POA: Diagnosis not present

## 2016-12-20 DIAGNOSIS — I480 Paroxysmal atrial fibrillation: Secondary | ICD-10-CM | POA: Diagnosis not present

## 2016-12-20 DIAGNOSIS — R9431 Abnormal electrocardiogram [ECG] [EKG]: Secondary | ICD-10-CM | POA: Diagnosis not present

## 2016-12-21 DIAGNOSIS — I2510 Atherosclerotic heart disease of native coronary artery without angina pectoris: Secondary | ICD-10-CM | POA: Diagnosis not present

## 2016-12-21 DIAGNOSIS — D72829 Elevated white blood cell count, unspecified: Secondary | ICD-10-CM | POA: Diagnosis not present

## 2016-12-21 DIAGNOSIS — I513 Intracardiac thrombosis, not elsewhere classified: Secondary | ICD-10-CM | POA: Diagnosis not present

## 2016-12-21 DIAGNOSIS — M5136 Other intervertebral disc degeneration, lumbar region: Secondary | ICD-10-CM | POA: Diagnosis not present

## 2016-12-21 DIAGNOSIS — I2584 Coronary atherosclerosis due to calcified coronary lesion: Secondary | ICD-10-CM | POA: Diagnosis not present

## 2016-12-21 DIAGNOSIS — I481 Persistent atrial fibrillation: Secondary | ICD-10-CM | POA: Diagnosis not present

## 2016-12-21 DIAGNOSIS — I422 Other hypertrophic cardiomyopathy: Secondary | ICD-10-CM | POA: Diagnosis not present

## 2016-12-21 DIAGNOSIS — J449 Chronic obstructive pulmonary disease, unspecified: Secondary | ICD-10-CM | POA: Diagnosis not present

## 2016-12-22 DIAGNOSIS — M5136 Other intervertebral disc degeneration, lumbar region: Secondary | ICD-10-CM | POA: Diagnosis not present

## 2016-12-22 DIAGNOSIS — D72829 Elevated white blood cell count, unspecified: Secondary | ICD-10-CM | POA: Diagnosis not present

## 2016-12-22 DIAGNOSIS — I2510 Atherosclerotic heart disease of native coronary artery without angina pectoris: Secondary | ICD-10-CM | POA: Diagnosis not present

## 2016-12-22 DIAGNOSIS — I513 Intracardiac thrombosis, not elsewhere classified: Secondary | ICD-10-CM | POA: Diagnosis not present

## 2016-12-22 DIAGNOSIS — I2584 Coronary atherosclerosis due to calcified coronary lesion: Secondary | ICD-10-CM | POA: Diagnosis not present

## 2016-12-22 DIAGNOSIS — J449 Chronic obstructive pulmonary disease, unspecified: Secondary | ICD-10-CM | POA: Diagnosis not present

## 2016-12-22 DIAGNOSIS — I481 Persistent atrial fibrillation: Secondary | ICD-10-CM | POA: Diagnosis not present

## 2016-12-22 DIAGNOSIS — I422 Other hypertrophic cardiomyopathy: Secondary | ICD-10-CM | POA: Diagnosis not present

## 2017-01-11 ENCOUNTER — Ambulatory Visit: Payer: Medicare Other | Admitting: Internal Medicine

## 2017-01-12 ENCOUNTER — Encounter: Payer: Self-pay | Admitting: Internal Medicine

## 2017-02-27 DIAGNOSIS — G894 Chronic pain syndrome: Secondary | ICD-10-CM | POA: Diagnosis not present

## 2017-02-27 DIAGNOSIS — Z1389 Encounter for screening for other disorder: Secondary | ICD-10-CM | POA: Diagnosis not present

## 2017-02-27 DIAGNOSIS — I10 Essential (primary) hypertension: Secondary | ICD-10-CM | POA: Diagnosis not present

## 2017-02-27 DIAGNOSIS — Z0001 Encounter for general adult medical examination with abnormal findings: Secondary | ICD-10-CM | POA: Diagnosis not present

## 2017-03-07 DIAGNOSIS — I481 Persistent atrial fibrillation: Secondary | ICD-10-CM | POA: Diagnosis not present

## 2017-03-08 ENCOUNTER — Telehealth (HOSPITAL_COMMUNITY): Payer: Self-pay | Admitting: *Deleted

## 2017-03-08 NOTE — Telephone Encounter (Signed)
Per Dr. [name] -- received request from Dr. Dwana Curd for outpatient cardioversion to be performed at [facility]. Called and left message for patient to return my call to help with this.

## 2017-03-10 NOTE — Telephone Encounter (Signed)
Patient will need to be seen prior to setting up cardioversion due to not seeing provider in system since April. Pt was upset by this but once protocol for cardioversion was explained patient was understanding of sequence of events as patient would like to avoid having it done at [place]. Pt is scheduled to be seen 10/1 and set up cardioversion from there.

## 2017-03-10 NOTE — Telephone Encounter (Signed)
Follow up     Patient calling back to inquire about  cardioversion scheduling. Please call

## 2017-03-13 ENCOUNTER — Encounter (HOSPITAL_COMMUNITY): Payer: Self-pay | Admitting: Nurse Practitioner

## 2017-03-13 ENCOUNTER — Ambulatory Visit (HOSPITAL_COMMUNITY)
Admission: RE | Admit: 2017-03-13 | Discharge: 2017-03-13 | Disposition: A | Discharge status: Home / Self Care | Payer: Medicare Other | Source: Ambulatory Visit | Attending: Nurse Practitioner | Admitting: Nurse Practitioner

## 2017-03-13 VITALS — BP 112/62 | HR 47 | Ht 61.0 in | Wt 214.0 lb

## 2017-03-13 DIAGNOSIS — I4892 Unspecified atrial flutter: Secondary | ICD-10-CM | POA: Diagnosis not present

## 2017-03-13 DIAGNOSIS — J449 Chronic obstructive pulmonary disease, unspecified: Secondary | ICD-10-CM | POA: Diagnosis not present

## 2017-03-13 DIAGNOSIS — I480 Paroxysmal atrial fibrillation: Secondary | ICD-10-CM

## 2017-03-13 DIAGNOSIS — Z7982 Long term (current) use of aspirin: Secondary | ICD-10-CM | POA: Insufficient documentation

## 2017-03-13 DIAGNOSIS — Z7901 Long term (current) use of anticoagulants: Secondary | ICD-10-CM | POA: Diagnosis not present

## 2017-03-13 DIAGNOSIS — I422 Other hypertrophic cardiomyopathy: Secondary | ICD-10-CM | POA: Diagnosis not present

## 2017-03-13 DIAGNOSIS — Z9889 Other specified postprocedural states: Secondary | ICD-10-CM | POA: Diagnosis not present

## 2017-03-13 DIAGNOSIS — Z79899 Other long term (current) drug therapy: Secondary | ICD-10-CM | POA: Diagnosis not present

## 2017-03-13 DIAGNOSIS — Z8541 Personal history of malignant neoplasm of cervix uteri: Secondary | ICD-10-CM | POA: Insufficient documentation

## 2017-03-13 DIAGNOSIS — I4891 Unspecified atrial fibrillation: Secondary | ICD-10-CM | POA: Diagnosis present

## 2017-03-13 DIAGNOSIS — F17210 Nicotine dependence, cigarettes, uncomplicated: Secondary | ICD-10-CM | POA: Diagnosis not present

## 2017-03-13 MED ORDER — DILTIAZEM HCL 30 MG PO TABS: ORAL_TABLET | ORAL | 1 refills | Status: DC

## 2017-03-13 NOTE — Progress Notes (Signed)
Primary Care Physician: Redmond School, MD Referring Physician: Dr. Dwana Curd, EP [facility] EP [facility]: Dr. Shelda Pal is a 51 y.o. female with a h/o persistent afib, HOCM, that recently had ablation at [facility], in July of  this year. At f/u visit, 9/25, she was found to be back in afib. I was notified by Dr. [name] to help assist getting pt set up for cardioversion locally. However, in the clinic today, she is in [place]. She states that she had missed her BB 2 days right before the [facility] visit and thinks that might have been the trigger to go into afib. She has had several smaller episodes of afib, states two yesterday. Per the last EP [facility] note, if she continued with increased afib burden, repeat ablation was suggested. She is being compliant with pradaxa and continues to smoke.  Today, she denies symptoms of palpitations, chest pain, shortness of breath, orthopnea, PND, lower extremity edema, dizziness, presyncope, syncope, or neurologic sequela. The patient is tolerating medications without difficulties and is otherwise without complaint today.   Past Medical History:  Diagnosis Date  . Asthma   . Cervical cancer ([place])    a. treated with partial removal of cervix  . COPD (chronic obstructive pulmonary disease) ([place])   . Hypertrophic cardiomyopathy ([place])    a. s/p myomectomy at [facility] 08/2004  . Paroxysmal atrial flutter ([facility])    a. post-op after myomectomy in 2006. s/p TEE/DCCV.  . Transient atrial fibrillation ([place]) 2006   a. after myomectomy at [facility] in 08/2004 - was on amiodarone but developed elevated LFTs felt possibly secondary to amiodarone    Past Surgical History:  Procedure Laterality Date  . CARDIOVERSION N/A 08/12/2015   Procedure: CARDIOVERSION;  Surgeon: Satira Sark, MD;  [facility];  Service: Cardiovascular;  Laterality: N/A;  . Septal myomectomy  2006   [facility]  . TEE WITHOUT CARDIOVERSION N/A 03/29/2016   Procedure:  TRANSESOPHAGEAL ECHOCARDIOGRAM (TEE);  Surgeon: Lelon Perla, MD;  Location: [facility] ENDOSCOPY;  Service: Cardiovascular;  Laterality: N/A;  . TEE WITHOUT CARDIOVERSION N/A 06/23/2016   Procedure: TRANSESOPHAGEAL ECHOCARDIOGRAM (TEE);  Surgeon: Thayer Headings, MD;  Location: [place];  Service: Cardiovascular;  Laterality: N/A;  . TUBAL LIGATION      Current Outpatient Prescriptions  Medication Sig Dispense Refill  . ALPRAZolam (XANAX) 1 MG tablet Take 1 mg by mouth 3 (three) times daily as needed for anxiety.     Marland Kitchen aspirin EC 81 MG tablet Take 81 mg by mouth daily.    . dabigatran (PRADAXA) 150 MG CAPS capsule Take 1 capsule (150 mg total) by mouth 2 (two) times daily. 180 capsule 3  . diltiazem (CARDIZEM CD) 300 MG 24 hr capsule Take 1 capsule (300 mg total) by mouth daily. 30 capsule 0  . furosemide (LASIX) 80 MG tablet Take 1 tablet (80 mg total) by mouth daily. Takes potassium with furosemide. 90 tablet 3  . HYDROcodone-acetaminophen (NORCO) 10-325 MG tablet Take 1 tablet by mouth every 6 (six) hours as needed for moderate pain or severe pain.     Marland Kitchen ibuprofen (ADVIL,MOTRIN) 800 MG tablet Take 800-1,600 mg by mouth 3 (three) times daily as needed for moderate pain.     Marland Kitchen levalbuterol (XOPENEX) 0.63 MG/3ML nebulizer solution Take 0.63 mg by nebulization every 6 (six) hours as needed for wheezing or shortness of breath.     . metoprolol tartrate (LOPRESSOR) 25 MG tablet TAKE 2 TABLETS  BY MOUTH TWICE DAILY. 120 tablet 10  . mupirocin cream (BACTROBAN) 2 % Apply 1 application topically daily as needed (wound care).    . potassium chloride SA (K-DUR,KLOR-CON) 20 MEQ tablet Take 1 tablet by mouth daily as needed. When you take Lasix    . PROAIR HFA 108 (90 Base) MCG/ACT inhaler Inhale 1-2 puffs into the lungs every 6 (six) hours as needed for wheezing or shortness of breath.     . diltiazem (CARDIZEM) 30 MG tablet Take 1 tablet every 4 hours AS NEEDED for afib heart rate over 100 45 tablet 1    No current facility-administered medications for this encounter.     Allergies  Allergen Reactions  . Amiodarone Other (See Comments)    Liver function    Social History   Social History  . Marital status: Married    Spouse name: N/A  . Number of children: N/A  . Years of education: N/A   Occupational History  . Housekeeper    Social History Main Topics  . Smoking status: Current Every Day Smoker    Packs/day: 0.50    Types: Cigarettes    Last attempt to quit: 03/28/2016  . Smokeless tobacco: Never Used     Comment: since age 58  . Alcohol use No  . Drug use: No  . Sexual activity: Not on file   Other Topics Concern  . Not on file   Social History Narrative  . No narrative on file    Family History  Problem Relation Age of Onset  . Heart failure Father   . Diabetes Unknown   . Lung disease Unknown     ROS- All systems are reviewed and negative except as per the HPI above  Physical Exam: Vitals:   03/13/17 0955  BP: 112/62  Pulse: (!) 47  Weight: 214 lb (97.1 kg)  Height: 5\' 1"  (1.549 m)   Wt Readings from Last 3 Encounters:  03/13/17 214 lb (97.1 kg)  10/05/16 232 lb 6.4 oz (105.4 kg)  07/06/16 215 lb 9.6 oz (97.8 kg)    Labs: Lab Results  Component Value Date   NA 141 10/05/2016   K 4.7 10/05/2016   CL 101 10/05/2016   CO2 23 10/05/2016   GLUCOSE 79 10/05/2016   BUN 22 10/05/2016   CREATININE 0.88 10/05/2016   CALCIUM 9.1 10/05/2016   MG 1.9 06/16/2016   No results found for: INR No results found for: CHOL, HDL, LDLCALC, TRIG   GEN- The patient is well appearing, alert and oriented x 3 today.   Head- normocephalic, atraumatic Eyes-  Sclera clear, conjunctiva pink Ears- hearing intact Oropharynx- clear Neck- supple, no JVP Lymph- no cervical lymphadenopathy Lungs- Clear to ausculation bilaterally, normal work of breathing Heart- Slow, regular rate and rhythm, no murmurs, rubs or gallops, PMI not laterally displaced GI- soft,  NT, ND, + BS Extremities- no clubbing, cyanosis, or edema MS- no significant deformity or atrophy Skin- no rash or lesion Psych- euthymic mood, full affect Neuro- strength and sensation are intact  EKG- Sinus brady at 47 bpm, pr int 188 ms, qrs int 114 ms, qtc 430 ms Epic records reviewed    Assessment and Plan: 1. Paroxysmal afib  In Sinus brady today, not symptomatic Continues to have paroxysmal afib RX 30 mg Cardizem to use as pill in pocket if needed for episodes of afib Continue on Pradaxa for chadsvasc score of at least 2 Chart afib episodes and currently has f/u with Dr.  Dwana Curd 08/2017 but encouraged to see sooner if afib burden increases for consideration of  repeat ablation  Afib clinic as needed  Butch Penny C. Keinan Brouillet, [facility] [address] [place], [place] [address] [phone]

## 2017-03-13 NOTE — Patient Instructions (Signed)
Your physician has recommended you make the following change in your medication:  1) Cardizem 30mg  -- take 1 tablet every 4 hours AS NEEDED for afib heart rate over 100 as long as top number of blood pressure >100.

## 2017-04-10 ENCOUNTER — Telehealth (HOSPITAL_COMMUNITY): Payer: Self-pay | Admitting: *Deleted

## 2017-04-10 MED ORDER — METOPROLOL TARTRATE 25 MG PO TABS: 25.0000 mg | ORAL_TABLET | Freq: Two times a day (BID) | ORAL | 10 refills | Status: DC

## 2017-04-10 NOTE — Telephone Encounter (Signed)
Patient called in stating she has had 2 episodes in the last week where she gets extremely lightheaded and feels like she passes out. HR still running in the 40-50s in NSR. Discussed with Roderic Palau NP will decrease metoprolol to 25mg  BID and reck on 11/1. Pt in agreement.

## 2017-04-13 ENCOUNTER — Ambulatory Visit (HOSPITAL_COMMUNITY)
Admission: RE | Admit: 2017-04-13 | Discharge: 2017-04-13 | Disposition: A | Discharge status: Home / Self Care | Payer: Medicare Other | Source: Ambulatory Visit | Attending: Nurse Practitioner | Admitting: Nurse Practitioner

## 2017-04-13 VITALS — BP 108/70 | HR 53 | Ht 61.0 in | Wt 215.0 lb

## 2017-04-13 DIAGNOSIS — J449 Chronic obstructive pulmonary disease, unspecified: Secondary | ICD-10-CM | POA: Insufficient documentation

## 2017-04-13 DIAGNOSIS — Z7901 Long term (current) use of anticoagulants: Secondary | ICD-10-CM | POA: Diagnosis not present

## 2017-04-13 DIAGNOSIS — I481 Persistent atrial fibrillation: Secondary | ICD-10-CM | POA: Diagnosis present

## 2017-04-13 DIAGNOSIS — I4892 Unspecified atrial flutter: Secondary | ICD-10-CM

## 2017-04-13 DIAGNOSIS — F17210 Nicotine dependence, cigarettes, uncomplicated: Secondary | ICD-10-CM | POA: Diagnosis not present

## 2017-04-13 DIAGNOSIS — Z7982 Long term (current) use of aspirin: Secondary | ICD-10-CM | POA: Insufficient documentation

## 2017-04-13 DIAGNOSIS — R55 Syncope and collapse: Secondary | ICD-10-CM | POA: Diagnosis not present

## 2017-04-13 DIAGNOSIS — Z9889 Other specified postprocedural states: Secondary | ICD-10-CM | POA: Insufficient documentation

## 2017-04-13 DIAGNOSIS — Z79899 Other long term (current) drug therapy: Secondary | ICD-10-CM | POA: Diagnosis not present

## 2017-04-13 DIAGNOSIS — I480 Paroxysmal atrial fibrillation: Secondary | ICD-10-CM | POA: Diagnosis not present

## 2017-04-13 DIAGNOSIS — I422 Other hypertrophic cardiomyopathy: Secondary | ICD-10-CM | POA: Diagnosis not present

## 2017-04-13 MED ORDER — DILTIAZEM HCL ER COATED BEADS 240 MG PO CP24
240.0000 mg | ORAL_CAPSULE | Freq: Every day | ORAL | 3 refills | Status: DC
Start: 2017-04-13 — End: 2017-05-08

## 2017-04-13 NOTE — Patient Instructions (Signed)
Your physician has recommended you make the following change in your medication:  1)Decrease Cardizem to 240mg  once a day

## 2017-04-13 NOTE — Progress Notes (Signed)
Primary Care Physician: Redmond School, MD Referring Physician: Dr. Dwana Curd, EP [facility] EP [facility]: Dr. Shelda Pal is a 51 y.o. female with a h/o persistent afib, HOCM, that recently had ablation at [facility], in July of  this year. At f/u visit, at [facility], 9/25, she was found to be back in afib. I was notified by Dr. [name] to help assist getting pt set up for cardioversion locally. In the clinic,10/1, she was in [place]. She states that she had missed her BB 2 days right before the [facility] visit and thinks that might have been the trigger to go into afib. She has had several smaller episodes of afib, states two yesterday. Per the last EP [facility] note, if she continued with increased afib burden, repeat ablation was suggested. She is being compliant with pradaxa and continues to smoke.  F/u in afib clinic, she has not had any further  afib. However, she tells me two episodes of syncope that she has had. The first episode was 2 weeks ago when she was sitting on the couch with her husband in the room with her. She felt lightheaded and husband noted that she went limp. It lasted around 1-2 mins. She was aware of her surroundings when coming around. HR after the episode was 53 bpm, and BP with a systolic around 272-536. She then had another episode last Friday at work. 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 She felt lightheaded and went to get a chair and didn't make it. She woke up on the floor. After a few minutes she was back to her usual self. No further episodes. Did not have afib associated with these episodes. HR today is 53 bpm. On last visit, HR was 47 bpm, and I decreased her BB on that visit.  Today, she denies symptoms of palpitations, chest pain, shortness of breath, orthopnea, PND, lower extremity edema, dizziness, presyncope, syncope, or neurologic sequela. The patient is tolerating medications without difficulties and is otherwise without complaint today.   Past Medical  History:  Diagnosis Date  . Asthma   . Cervical cancer ([place])    a. treated with partial removal of cervix  . COPD (chronic obstructive pulmonary disease) ([place])   . Hypertrophic cardiomyopathy ([place])    a. s/p myomectomy at [facility] 08/2004  . Paroxysmal atrial flutter ([facility])    a. post-op after myomectomy in 2006. s/p TEE/DCCV.  . Transient atrial fibrillation ([place]) 2006   a. after myomectomy at [facility] in 08/2004 - was on amiodarone but developed elevated LFTs felt possibly secondary to amiodarone    Past Surgical History:  Procedure Laterality Date  . CARDIOVERSION N/A 08/12/2015   Procedure: CARDIOVERSION;  Surgeon: Satira Sark, MD;  Location[facility];  Service: Cardiovascular;  Laterality: N/A;  . Septal myomectomy  2006   [facility]  . TEE WITHOUT CARDIOVERSION N/A 03/29/2016   Procedure: TRANSESOPHAGEAL ECHOCARDIOGRAM (TEE);  Surgeon: Lelon Perla, MD;  Location: [facility] ENDOSCOPY;  Service: Cardiovascular;  Laterality: N/A;  . TEE WITHOUT CARDIOVERSION N/A 06/23/2016   Procedure: TRANSESOPHAGEAL ECHOCARDIOGRAM (TEE);  Surgeon: Thayer Headings, MD;  Location: [facility];  Service: Cardiovascular;  Laterality: N/A;  . TUBAL LIGATION      Current Outpatient Prescriptions  Medication Sig Dispense Refill  . dabigatran (PRADAXA) 150 MG CAPS capsule Take 1 capsule (150 mg total) by mouth 2 (two) times daily. 180 capsule 3  . diltiazem (CARDIZEM CD) 240 MG 24 hr capsule  Take 1 capsule (240 mg total) by mouth daily. 30 capsule 3  . diltiazem (CARDIZEM) 30 MG tablet Take 1 tablet every 4 hours AS NEEDED for afib heart rate over 100 45 tablet 1  . furosemide (LASIX) 80 MG tablet Take 1 tablet (80 mg total) by mouth daily. Takes potassium with furosemide. (Patient taking differently: Take 80 mg by mouth as needed. Takes potassium with furosemide.) 90 tablet 3  . HYDROcodone-acetaminophen (NORCO) 10-325 MG tablet Take 1 tablet by mouth every 6 (six) hours as needed for moderate pain or severe  pain.     Marland Kitchen ibuprofen (ADVIL,MOTRIN) 800 MG tablet Take 800-1,600 mg by mouth 3 (three) times daily as needed for moderate pain.     Marland Kitchen levalbuterol (XOPENEX) 0.63 MG/3ML nebulizer solution Take 0.63 mg by nebulization every 6 (six) hours as needed for wheezing or shortness of breath.     . metoprolol tartrate (LOPRESSOR) 25 MG tablet Take 1 tablet (25 mg total) by mouth 2 (two) times daily. 120 tablet 10  . potassium chloride SA (K-DUR,KLOR-CON) 20 MEQ tablet Take 1 tablet by mouth daily as needed. When you take Lasix    . PROAIR HFA 108 (90 Base) MCG/ACT inhaler Inhale 1-2 puffs into the lungs every 6 (six) hours as needed for wheezing or shortness of breath.     . ALPRAZolam (XANAX) 1 MG tablet Take 1 mg by mouth 3 (three) times daily as needed for anxiety.     Marland Kitchen aspirin EC 81 MG tablet Take 81 mg by mouth daily.    . mupirocin cream (BACTROBAN) 2 % Apply 1 application topically daily as needed (wound care).     No current facility-administered medications for this encounter.     Allergies  Allergen Reactions  . Amiodarone Other (See Comments)    Liver function    Social History   Social History  . Marital status: Married    Spouse name: N/A  . Number of children: N/A  . Years of education: N/A   Occupational History  . Housekeeper    Social History Main Topics  . Smoking status: Current Every Day Smoker    Packs/day: 0.50    Types: Cigarettes    Last attempt to quit: 03/28/2016  . Smokeless tobacco: Never Used     Comment: since age 31  . Alcohol use No  . Drug use: No  . Sexual activity: Not on file   Other Topics Concern  . Not on file   Social History Narrative  . No narrative on file    Family History  Problem Relation Age of Onset  . Heart failure Father   . Diabetes Unknown   . Lung disease Unknown     ROS- All systems are reviewed and negative except as per the HPI above  Physical Exam: Vitals:   04/13/17 1507  BP: 108/70  Pulse: (!) 53  Weight:  215 lb (97.5 kg)  Height: 5\' 1"  (1.549 m)   Wt Readings from Last 3 Encounters:  04/13/17 215 lb (97.5 kg)  03/13/17 214 lb (97.1 kg)  10/05/16 232 lb 6.4 oz (105.4 kg)    Labs: Lab Results  Component Value Date   NA 141 10/05/2016   K 4.7 10/05/2016   CL 101 10/05/2016   CO2 23 10/05/2016   GLUCOSE 79 10/05/2016   BUN 22 10/05/2016   CREATININE 0.88 10/05/2016   CALCIUM 9.1 10/05/2016   MG 1.9 06/16/2016   No results found for: INR No  results found for: CHOL, HDL, LDLCALC, TRIG   GEN- The patient is well appearing, alert and oriented x 3 today.   Head- normocephalic, atraumatic Eyes-  Sclera clear, conjunctiva pink Ears- hearing intact Oropharynx- clear Neck- supple, no JVP Lymph- no cervical lymphadenopathy Lungs- Clear to ausculation bilaterally, normal work of breathing Heart- Slow, regular rate and rhythm, no murmurs, rubs or gallops, PMI not laterally displaced GI- soft, NT, ND, + BS Extremities- no clubbing, cyanosis, or edema MS- no significant deformity or atrophy Skin- no rash or lesion Psych- euthymic mood, full affect Neuro- strength and sensation are intact  EKG- Sinus brady at 53 bpm, pr int 192 ms, qrs int 118 bpm, qtc 442 ms Epic records reviewed    Assessment and Plan: 1. Paroxysmal afib  S/p ablation 12/2016  and reports no afib for weeks Continue on Pradaxa for chadsvasc score of at least 2  2. Syncope ? Etiology, Brady/Hypotension,  Will lower cardizem to 240 mg a day  Continue lower dose of BB, 25 mg bid Make sure is staying hydrated Does not sound like termination pauses May have to consider LInq if episodes continue Encouraged not to drive  Afib clinic in one week  Butch Penny C. Adin Lariccia, [facility] [address] [place], [place] [address] [phone]

## 2017-04-20 ENCOUNTER — Ambulatory Visit (HOSPITAL_COMMUNITY)
Admission: RE | Admit: 2017-04-20 | Discharge: 2017-04-20 | Disposition: A | Discharge status: Home / Self Care | Payer: Medicare Other | Source: Ambulatory Visit | Attending: Nurse Practitioner | Admitting: Nurse Practitioner

## 2017-04-20 ENCOUNTER — Encounter (HOSPITAL_COMMUNITY): Payer: Self-pay | Admitting: Nurse Practitioner

## 2017-04-20 VITALS — BP 118/74 | HR 54 | Ht 61.0 in | Wt 212.0 lb

## 2017-04-20 DIAGNOSIS — Z79899 Other long term (current) drug therapy: Secondary | ICD-10-CM | POA: Diagnosis not present

## 2017-04-20 DIAGNOSIS — R001 Bradycardia, unspecified: Secondary | ICD-10-CM | POA: Diagnosis not present

## 2017-04-20 DIAGNOSIS — I959 Hypotension, unspecified: Secondary | ICD-10-CM | POA: Diagnosis not present

## 2017-04-20 DIAGNOSIS — I4892 Unspecified atrial flutter: Secondary | ICD-10-CM

## 2017-04-20 DIAGNOSIS — J449 Chronic obstructive pulmonary disease, unspecified: Secondary | ICD-10-CM | POA: Insufficient documentation

## 2017-04-20 DIAGNOSIS — Z7901 Long term (current) use of anticoagulants: Secondary | ICD-10-CM | POA: Insufficient documentation

## 2017-04-20 DIAGNOSIS — Z9889 Other specified postprocedural states: Secondary | ICD-10-CM | POA: Insufficient documentation

## 2017-04-20 DIAGNOSIS — R55 Syncope and collapse: Secondary | ICD-10-CM | POA: Diagnosis not present

## 2017-04-20 DIAGNOSIS — I491 Atrial premature depolarization: Secondary | ICD-10-CM | POA: Diagnosis not present

## 2017-04-20 DIAGNOSIS — F17210 Nicotine dependence, cigarettes, uncomplicated: Secondary | ICD-10-CM | POA: Diagnosis not present

## 2017-04-20 DIAGNOSIS — I422 Other hypertrophic cardiomyopathy: Secondary | ICD-10-CM | POA: Diagnosis not present

## 2017-04-20 DIAGNOSIS — I480 Paroxysmal atrial fibrillation: Secondary | ICD-10-CM | POA: Diagnosis not present

## 2017-04-20 DIAGNOSIS — Z8541 Personal history of malignant neoplasm of cervix uteri: Secondary | ICD-10-CM | POA: Diagnosis not present

## 2017-04-20 DIAGNOSIS — Z7982 Long term (current) use of aspirin: Secondary | ICD-10-CM | POA: Diagnosis not present

## 2017-04-20 DIAGNOSIS — I4510 Unspecified right bundle-branch block: Secondary | ICD-10-CM | POA: Insufficient documentation

## 2017-04-20 MED ORDER — METOPROLOL TARTRATE 25 MG PO TABS: 12.5000 mg | ORAL_TABLET | Freq: Two times a day (BID) | ORAL | 10 refills | Status: DC

## 2017-04-20 NOTE — Progress Notes (Signed)
Primary Care Physician: Redmond School, MD Referring Physician: Dr. Dwana Curd, EP [facility] EP [facility]: Dr. Shelda Pal is a 51 y.o. female with a h/o persistent afib, HOCM, that recently had ablation at [facility], in July of  this year. At f/u visit, at [facility], 9/25, she was found to be back in afib. I was notified by Dr. [name] to help assist getting pt set up for cardioversion locally. In the clinic,10/1, she was in [place]. She states that she had missed her BB 2 days right before the [facility] visit and thinks that might have been the trigger to go into afib. She has had several smaller episodes of afib, states two yesterday. Per the last EP [facility] note, if she continued with increased afib burden, repeat ablation was suggested. She is being compliant with pradaxa and continues to smoke.  F/u in afib clinic, she has not had any further  afib. However, she tells me two episodes of syncope that she has had. The first episode was 2 weeks ago when she was sitting on the couch with her husband in the room with her. She felt lightheaded and husband noted that she went limp. It lasted around 1-2 mins. She was aware of her surroundings when coming around. HR after the episode was 53 bpm, and BP with a systolic around 409-811. She then had another episode last Friday at work. 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 She felt lightheaded and went to get a chair and didn't make it. She woke up on the floor. After a few minutes she was back to her usual self. No further episodes. Did not have afib associated with these episodes. HR today is 53 bpm. On last visit, HR was 47 bpm, and I decreased her BB on that visit.  F/u in afib clinic 11/8, on last visit, she co/ of two syncopal episodes. I conferred with Dr. [name] and he said to keep reducing BB. She is now on 25 mg bid. Still with SR at 54 bpm, with PAC's. No further syncopal episodes but got lightheaded one day. Has not noted any afib.  Today,  she denies symptoms of palpitations, chest pain, shortness of breath, orthopnea, PND, lower extremity edema, dizziness, presyncope, syncope, or neurologic sequela. The patient is tolerating medications without difficulties and is otherwise without complaint today.   Past Medical History:  Diagnosis Date  . Asthma   . Cervical cancer ([place])    a. treated with partial removal of cervix  . COPD (chronic obstructive pulmonary disease) ([place])   . Hypertrophic cardiomyopathy ([place])    a. s/p myomectomy at [facility] 08/2004  . Paroxysmal atrial flutter ([facility])    a. post-op after myomectomy in 2006. s/p TEE/DCCV.  . Transient atrial fibrillation ([place]) 2006   a. after myomectomy at [facility] in 08/2004 - was on amiodarone but developed elevated LFTs felt possibly secondary to amiodarone    Past Surgical History:  Procedure Laterality Date  . Septal myomectomy  2006   [facility]  . TUBAL LIGATION      Current Outpatient Medications  Medication Sig Dispense Refill  . ALPRAZolam (XANAX) 1 MG tablet Take 1 mg by mouth 3 (three) times daily as needed for anxiety.     Marland Kitchen aspirin EC 81 MG tablet Take 81 mg by mouth daily.    . dabigatran (PRADAXA) 150 MG CAPS capsule Take 1 capsule (150 mg total) by mouth 2 (two) times daily. 180 capsule 3  .  diltiazem (CARDIZEM CD) 240 MG 24 hr capsule Take 1 capsule (240 mg total) by mouth daily. 30 capsule 3  . furosemide (LASIX) 80 MG tablet Take 1 tablet (80 mg total) by mouth daily. Takes potassium with furosemide. (Patient taking differently: Take 80 mg by mouth as needed. Takes potassium with furosemide.) 90 tablet 3  . HYDROcodone-acetaminophen (NORCO) 10-325 MG tablet Take 1 tablet by mouth every 6 (six) hours as needed for moderate pain or severe pain.     Marland Kitchen ibuprofen (ADVIL,MOTRIN) 800 MG tablet Take 800-1,600 mg by mouth 3 (three) times daily as needed for moderate pain.     Marland Kitchen levalbuterol (XOPENEX) 0.63 MG/3ML nebulizer solution Take 0.63 mg by nebulization every 6 (six) hours  as needed for wheezing or shortness of breath.     . metoprolol tartrate (LOPRESSOR) 25 MG tablet Take 0.5 tablets (12.5 mg total) 2 (two) times daily by mouth. 120 tablet 10  . mupirocin cream (BACTROBAN) 2 % Apply 1 application topically daily as needed (wound care).    . potassium chloride SA (K-DUR,KLOR-CON) 20 MEQ tablet Take 1 tablet by mouth daily as needed. When you take Lasix    . PROAIR HFA 108 (90 Base) MCG/ACT inhaler Inhale 1-2 puffs into the lungs every 6 (six) hours as needed for wheezing or shortness of breath.     . diltiazem (CARDIZEM) 30 MG tablet Take 1 tablet every 4 hours AS NEEDED for afib heart rate over 100 (Patient not taking: Reported on 04/20/2017) 45 tablet 1   No current facility-administered medications for this encounter.     Allergies  Allergen Reactions  . Amiodarone Other (See Comments)    Liver function    Social History   Socioeconomic History  . Marital status: Married    Spouse name: Not on file  . Number of children: Not on file  . Years of education: Not on file  . Highest education level: Not on file  Social Needs  . Financial resource strain: Not on file  . Food insecurity - worry: Not on file  . Food insecurity - inability: Not on file  . Transportation needs - medical: Not on file  . Transportation needs - non-medical: Not on file  Occupational History  . Occupation: Housekeeper  Tobacco Use  . Smoking status: Current Every Day Smoker    Packs/day: 0.50    Types: Cigarettes    Last attempt to quit: 03/28/2016    Years since quitting: 1.0  . Smokeless tobacco: Never Used  . Tobacco comment: since age 46  Substance and Sexual Activity  . Alcohol use: No    Alcohol/week: 0.0 oz  . Drug use: No  . Sexual activity: Not on file  Other Topics Concern  . Not on file  Social History Narrative  . Not on file    Family History  Problem Relation Age of Onset  . Heart failure Father   . Diabetes Unknown   . Lung disease Unknown      ROS- All systems are reviewed and negative except as per the HPI above  Physical Exam: Vitals:   04/20/17 1524  BP: 118/74  Pulse: (!) 54  Weight: 212 lb (96.2 kg)  Height: 5\' 1"  (1.549 m)   Wt Readings from Last 3 Encounters:  04/20/17 212 lb (96.2 kg)  04/13/17 215 lb (97.5 kg)  03/13/17 214 lb (97.1 kg)    Labs: Lab Results  Component Value Date   NA 141 10/05/2016   K  4.7 10/05/2016   CL 101 10/05/2016   CO2 23 10/05/2016   GLUCOSE 79 10/05/2016   BUN 22 10/05/2016   CREATININE 0.88 10/05/2016   CALCIUM 9.1 10/05/2016   MG 1.9 06/16/2016   No results found for: INR No results found for: CHOL, HDL, LDLCALC, TRIG   GEN- The patient is well appearing, alert and oriented x 3 today.   Head- normocephalic, atraumatic Eyes-  Sclera clear, conjunctiva pink Ears- hearing intact Oropharynx- clear Neck- supple, no JVP Lymph- no cervical lymphadenopathy Lungs- Clear to ausculation bilaterally, normal work of breathing Heart- Slow, regular rate and rhythm, no murmurs, rubs or gallops, PMI not laterally displaced GI- soft, NT, ND, + BS Extremities- no clubbing, cyanosis, or edema MS- no significant deformity or atrophy Skin- no rash or lesion Psych- euthymic mood, full affect Neuro- strength and sensation are intact  EKG- Sinus brady at 54  bpm, pr int 214 ms, qrs int 116 bpm, qtc 434 ms Epic records reviewed    Assessment and Plan: 1. Paroxysmal afib  S/p ablation 12/2016  and reports no afib for weeks Continue on Pradaxa for chadsvasc score of at least 2  2. Syncope ? Etiology, Brady/Hypotension,  Continue Cardizem at 240 mg a day  Continue to  lower dose of BB, 25 mg bid, 1/2 tab bid Make sure is staying hydrated Does not sound like termination pauses May have to consider LInq if episodes continue Encouraged not to drive  Afib clinic in 2 weeks  Kathleen Wright, [facility] [address] [place], [place]  [address] [phone]

## 2017-04-20 NOTE — Patient Instructions (Signed)
Your physician has recommended you make the following change in your medication:  1)Decrease Metoprolol to 1/2 tab twice a day

## 2017-04-26 DIAGNOSIS — J329 Chronic sinusitis, unspecified: Secondary | ICD-10-CM | POA: Diagnosis not present

## 2017-04-26 DIAGNOSIS — J449 Chronic obstructive pulmonary disease, unspecified: Secondary | ICD-10-CM | POA: Diagnosis not present

## 2017-05-08 ENCOUNTER — Encounter (HOSPITAL_COMMUNITY): Payer: Self-pay | Admitting: Nurse Practitioner

## 2017-05-08 ENCOUNTER — Ambulatory Visit (HOSPITAL_COMMUNITY)
Admission: RE | Admit: 2017-05-08 | Discharge: 2017-05-08 | Disposition: A | Discharge status: Home / Self Care | Payer: Medicare Other | Source: Ambulatory Visit | Attending: Nurse Practitioner | Admitting: Nurse Practitioner

## 2017-05-08 VITALS — BP 98/62 | HR 54 | Ht 61.0 in | Wt 211.0 lb

## 2017-05-08 DIAGNOSIS — Z7901 Long term (current) use of anticoagulants: Secondary | ICD-10-CM | POA: Diagnosis not present

## 2017-05-08 DIAGNOSIS — I480 Paroxysmal atrial fibrillation: Secondary | ICD-10-CM | POA: Diagnosis not present

## 2017-05-08 DIAGNOSIS — I481 Persistent atrial fibrillation: Secondary | ICD-10-CM | POA: Diagnosis not present

## 2017-05-08 DIAGNOSIS — F17210 Nicotine dependence, cigarettes, uncomplicated: Secondary | ICD-10-CM | POA: Diagnosis not present

## 2017-05-08 DIAGNOSIS — Z9889 Other specified postprocedural states: Secondary | ICD-10-CM | POA: Insufficient documentation

## 2017-05-08 DIAGNOSIS — J449 Chronic obstructive pulmonary disease, unspecified: Secondary | ICD-10-CM | POA: Diagnosis not present

## 2017-05-08 DIAGNOSIS — Z8541 Personal history of malignant neoplasm of cervix uteri: Secondary | ICD-10-CM | POA: Insufficient documentation

## 2017-05-08 DIAGNOSIS — I422 Other hypertrophic cardiomyopathy: Secondary | ICD-10-CM | POA: Diagnosis not present

## 2017-05-08 DIAGNOSIS — R55 Syncope and collapse: Secondary | ICD-10-CM | POA: Insufficient documentation

## 2017-05-08 DIAGNOSIS — R42 Dizziness and giddiness: Secondary | ICD-10-CM | POA: Diagnosis not present

## 2017-05-08 DIAGNOSIS — Z79899 Other long term (current) drug therapy: Secondary | ICD-10-CM | POA: Insufficient documentation

## 2017-05-08 DIAGNOSIS — Z7982 Long term (current) use of aspirin: Secondary | ICD-10-CM | POA: Insufficient documentation

## 2017-05-08 MED ORDER — DILTIAZEM HCL ER COATED BEADS 180 MG PO CP24: 180.0000 mg | ORAL_CAPSULE | Freq: Every day | ORAL | 2 refills | Status: DC

## 2017-05-08 NOTE — Patient Instructions (Signed)
Decrease cardizem to 180mg  a day

## 2017-05-08 NOTE — Progress Notes (Signed)
Primary Care Physician: Redmond School, MD Referring Physician: Dr. Dwana Curd, EP [facility] EP [facility]: Dr. Shelda Pal is a 51 y.o. female with a h/o persistent afib, HOCM, that  had ablation at [facility], in July of  this year. At f/u visit, at [facility], 9/25, she was found to be back in afib. I was notified by Dr. [name] to help assist getting pt set up for cardioversion locally. In the clinic,10/1, she was in [place]. She states that she had missed her BB 2 days right before the [facility] visit and thinks that might have been the trigger to go into afib. She has had several smaller episodes of afib, states two yesterday. Per the last EP [facility] note, if she continued with increased afib burden, repeat ablation was suggested. She is being compliant with pradaxa and continues to smoke.  F/u in afib clinic, 11/1, she has not had any further  afib. However, she tells me two episodes of syncope that she has had. The first episode was 2 weeks ago when she was sitting on the couch with her husband in the room with her. She felt lightheaded and husband noted that she went limp. It lasted around 1-2 mins. She was aware of her surroundings when coming around. HR after the episode was 53 bpm, and BP with a systolic around 867-672. She then had another episode last Friday at work. 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 She felt lightheaded and went to get a chair and didn't make it. She woke up on the floor. After a few minutes she was back to her usual self. No further episodes. Did not have afib associated with these episodes. HR  that day was 53 bpm. On last visit, HR was 47 bpm, and I decreased her BB on that visit.  F/u in afib clinic 11/26, I have been decreasing rate control meds since the visit 11/1, for dizzy spells. No more syncope but she tells me a couple of times lightheaded.  Bop only 98/62. Has not used lasix recently, only uses as needed. No significant afib.  Today, she denies  symptoms of palpitations, chest pain, shortness of breath, orthopnea, PND, lower extremity edema, dizziness, presyncope, syncope, or neurologic sequela. The patient is tolerating medications without difficulties and is otherwise without complaint today.   Past Medical History:  Diagnosis Date  . Asthma   . Cervical cancer ([place])    a. treated with partial removal of cervix  . COPD (chronic obstructive pulmonary disease) ([place])   . Hypertrophic cardiomyopathy ([place])    a. s/p myomectomy at [facility] 08/2004  . Paroxysmal atrial flutter ([facility])    a. post-op after myomectomy in 2006. s/p TEE/DCCV.  . Transient atrial fibrillation ([place]) 2006   a. after myomectomy at [facility] in 08/2004 - was on amiodarone but developed elevated LFTs felt possibly secondary to amiodarone    Past Surgical History:  Procedure Laterality Date  . CARDIOVERSION N/A 08/12/2015   Procedure: CARDIOVERSION;  Surgeon: Satira Sark, MD;  [facility];  Service: Cardiovascular;  Laterality: N/A;  . Septal myomectomy  2006   [facility]  . TEE WITHOUT CARDIOVERSION N/A 03/29/2016   Procedure: TRANSESOPHAGEAL ECHOCARDIOGRAM (TEE);  Surgeon: Lelon Perla, MD;  Location: [facility] ENDOSCOPY;  Service: Cardiovascular;  Laterality: N/A;  . TEE WITHOUT CARDIOVERSION N/A 06/23/2016   Procedure: TRANSESOPHAGEAL ECHOCARDIOGRAM (TEE);  Surgeon: Thayer Headings, MD;  Location: [place];  Service: Cardiovascular;  Laterality:  N/A;  . TUBAL LIGATION      Current Outpatient Medications  Medication Sig Dispense Refill  . albuterol (VOSPIRE ER) 4 MG 12 hr tablet 4 mg 3 (three) times daily.    Marland Kitchen ALPRAZolam (XANAX) 1 MG tablet Take 1 mg by mouth 3 (three) times daily as needed for anxiety.     Marland Kitchen aspirin EC 81 MG tablet Take 81 mg by mouth daily.    . dabigatran (PRADAXA) 150 MG CAPS capsule Take 1 capsule (150 mg total) by mouth 2 (two) times daily. 180 capsule 3  . diltiazem (CARDIZEM CD) 180 MG 24 hr capsule Take 1 capsule (180 mg total) by  mouth daily. 30 capsule 2  . diltiazem (CARDIZEM) 30 MG tablet Take 1 tablet every 4 hours AS NEEDED for afib heart rate over 100 45 tablet 1  . furosemide (LASIX) 80 MG tablet Take 1 tablet (80 mg total) by mouth daily. Takes potassium with furosemide. (Patient taking differently: Take 80 mg by mouth as needed. Takes potassium with furosemide.) 90 tablet 3  . HYDROcodone-acetaminophen (NORCO) 10-325 MG tablet Take 1 tablet by mouth every 6 (six) hours as needed for moderate pain or severe pain.     Marland Kitchen ibuprofen (ADVIL,MOTRIN) 800 MG tablet Take 800-1,600 mg by mouth 3 (three) times daily as needed for moderate pain.     Marland Kitchen levalbuterol (XOPENEX) 0.63 MG/3ML nebulizer solution Take 0.63 mg by nebulization every 6 (six) hours as needed for wheezing or shortness of breath.     . metoprolol tartrate (LOPRESSOR) 25 MG tablet Take 0.5 tablets (12.5 mg total) 2 (two) times daily by mouth. 120 tablet 10  . mupirocin cream (BACTROBAN) 2 % Apply 1 application topically daily as needed (wound care).    . potassium chloride SA (K-DUR,KLOR-CON) 20 MEQ tablet Take 1 tablet by mouth daily as needed. When you take Lasix    . PROAIR HFA 108 (90 Base) MCG/ACT inhaler Inhale 1-2 puffs into the lungs every 6 (six) hours as needed for wheezing or shortness of breath.      No current facility-administered medications for this encounter.     Allergies  Allergen Reactions  . Amiodarone Other (See Comments)    Liver function    Social History   Socioeconomic History  . Marital status: Married    Spouse name: Not on file  . Number of children: Not on file  . Years of education: Not on file  . Highest education level: Not on file  Social Needs  . Financial resource strain: Not on file  . Food insecurity - worry: Not on file  . Food insecurity - inability: Not on file  . Transportation needs - medical: Not on file  . Transportation needs - non-medical: Not on file  Occupational History  . Occupation:  Housekeeper  Tobacco Use  . Smoking status: Current Every Day Smoker    Packs/day: 0.50    Types: Cigarettes    Last attempt to quit: 03/28/2016    Years since quitting: 1.1  . Smokeless tobacco: Never Used  . Tobacco comment: since age 70  Substance and Sexual Activity  . Alcohol use: No    Alcohol/week: 0.0 oz  . Drug use: No  . Sexual activity: Not on file  Other Topics Concern  . Not on file  Social History Narrative  . Not on file    Family History  Problem Relation Age of Onset  . Heart failure Father   . Diabetes Unknown   .  Lung disease Unknown     ROS- All systems are reviewed and negative except as per the HPI above  Physical Exam: Vitals:   05/08/17 1120  BP: 98/62  Pulse: (!) 54  Weight: 211 lb (95.7 kg)  Height: 5\' 1"  (1.549 m)   Wt Readings from Last 3 Encounters:  05/08/17 211 lb (95.7 kg)  04/20/17 212 lb (96.2 kg)  04/13/17 215 lb (97.5 kg)    Labs: Lab Results  Component Value Date   NA 141 10/05/2016   K 4.7 10/05/2016   CL 101 10/05/2016   CO2 23 10/05/2016   GLUCOSE 79 10/05/2016   BUN 22 10/05/2016   CREATININE 0.88 10/05/2016   CALCIUM 9.1 10/05/2016   MG 1.9 06/16/2016   No results found for: INR No results found for: CHOL, HDL, LDLCALC, TRIG   GEN- The patient is well appearing, alert and oriented x 3 today.   Head- normocephalic, atraumatic Eyes-  Sclera clear, conjunctiva pink Ears- hearing intact Oropharynx- clear Neck- supple, no JVP Lymph- no cervical lymphadenopathy Lungs- Clear to ausculation bilaterally, normal work of breathing Heart- Slow, regular rate and rhythm, no murmurs, rubs or gallops, PMI not laterally displaced GI- soft, NT, ND, + BS Extremities- no clubbing, cyanosis, or edema MS- no significant deformity or atrophy Skin- no rash or lesion Psych- euthymic mood, full affect Neuro- strength and sensation are intact  EKG- Sinus brady at 54 bpm, pr int 184 ms, qrs int 112 bpm, qtc 417 ms Epic records  reviewed    Assessment and Plan: 1. Paroxysmal afib  S/p ablation 12/2016  and reports no significant afib  Continue on Pradaxa for chadsvasc score of at least 2  2. Syncope ? Etiology, Brady/Hypotension No further syncope but some still lingering lightheaded spells Will lower cardizem to  180 mg a day  Continue lower dose of BB, 12.5 mg bid Make sure is staying hydrated Encouraged not to drive  Afib clinic in 2 weeks  Butch Penny C. Korie Streat, [facility] [address] [place], [place] [address] [phone]

## 2017-05-22 ENCOUNTER — Encounter (HOSPITAL_COMMUNITY): Payer: Medicare Other | Admitting: Nurse Practitioner

## 2017-05-30 ENCOUNTER — Ambulatory Visit (HOSPITAL_COMMUNITY)
Admission: RE | Admit: 2017-05-30 | Discharge: 2017-05-30 | Disposition: A | Discharge status: Home / Self Care | Payer: Medicare Other | Source: Ambulatory Visit | Attending: Nurse Practitioner | Admitting: Nurse Practitioner

## 2017-05-30 DIAGNOSIS — I4891 Unspecified atrial fibrillation: Secondary | ICD-10-CM | POA: Insufficient documentation

## 2017-05-30 DIAGNOSIS — I498 Other specified cardiac arrhythmias: Secondary | ICD-10-CM | POA: Insufficient documentation

## 2017-05-30 NOTE — Progress Notes (Addendum)
Pt in for repeat EKG.  To be reviewed by Roderic Palau, NP  Pt in for repeat EKG for reduction in rate control for syncope x 2 several weeks ago thought to be 2/2 low BP/HR form rate control drugs. She is now on 180 mg cardizem and 12.5 metoprolol and is in SR today. No afib episodes and no further weak spells. F/u with [facility] EP in March as scheduled and in fib clinic in 6 months. BP 118/72

## 2017-07-01 ENCOUNTER — Other Ambulatory Visit (HOSPITAL_COMMUNITY): Payer: Self-pay | Admitting: Nurse Practitioner

## 2017-07-04 ENCOUNTER — Other Ambulatory Visit (HOSPITAL_COMMUNITY): Payer: Self-pay | Admitting: Nurse Practitioner

## 2017-09-05 DIAGNOSIS — I10 Essential (primary) hypertension: Secondary | ICD-10-CM | POA: Diagnosis not present

## 2017-09-05 DIAGNOSIS — G894 Chronic pain syndrome: Secondary | ICD-10-CM | POA: Diagnosis not present

## 2017-09-05 DIAGNOSIS — Z1389 Encounter for screening for other disorder: Secondary | ICD-10-CM | POA: Diagnosis not present

## 2017-09-05 DIAGNOSIS — I4891 Unspecified atrial fibrillation: Secondary | ICD-10-CM | POA: Diagnosis not present

## 2017-09-05 DIAGNOSIS — I421 Obstructive hypertrophic cardiomyopathy: Secondary | ICD-10-CM | POA: Diagnosis not present

## 2017-11-26 ENCOUNTER — Other Ambulatory Visit: Payer: Self-pay

## 2017-11-26 ENCOUNTER — Encounter (HOSPITAL_COMMUNITY): Payer: Self-pay | Admitting: Emergency Medicine

## 2017-11-26 ENCOUNTER — Emergency Department (HOSPITAL_COMMUNITY)
Admission: EM | Admit: 2017-11-26 | Discharge: 2017-11-26 | Disposition: A | Discharge status: Home / Self Care | Payer: Medicare Other | Attending: Emergency Medicine | Admitting: Emergency Medicine

## 2017-11-26 DIAGNOSIS — S61310A Laceration without foreign body of right index finger with damage to nail, initial encounter: Secondary | ICD-10-CM | POA: Diagnosis not present

## 2017-11-26 DIAGNOSIS — S61210A Laceration without foreign body of right index finger without damage to nail, initial encounter: Secondary | ICD-10-CM | POA: Diagnosis not present

## 2017-11-26 DIAGNOSIS — J449 Chronic obstructive pulmonary disease, unspecified: Secondary | ICD-10-CM | POA: Insufficient documentation

## 2017-11-26 DIAGNOSIS — F17210 Nicotine dependence, cigarettes, uncomplicated: Secondary | ICD-10-CM | POA: Insufficient documentation

## 2017-11-26 DIAGNOSIS — Y999 Unspecified external cause status: Secondary | ICD-10-CM | POA: Diagnosis not present

## 2017-11-26 DIAGNOSIS — Z7982 Long term (current) use of aspirin: Secondary | ICD-10-CM | POA: Insufficient documentation

## 2017-11-26 DIAGNOSIS — Y929 Unspecified place or not applicable: Secondary | ICD-10-CM | POA: Insufficient documentation

## 2017-11-26 DIAGNOSIS — Y93G9 Activity, other involving cooking and grilling: Secondary | ICD-10-CM | POA: Insufficient documentation

## 2017-11-26 DIAGNOSIS — Z79899 Other long term (current) drug therapy: Secondary | ICD-10-CM | POA: Diagnosis not present

## 2017-11-26 DIAGNOSIS — W260XXA Contact with knife, initial encounter: Secondary | ICD-10-CM | POA: Diagnosis not present

## 2017-11-26 DIAGNOSIS — S6991XA Unspecified injury of right wrist, hand and finger(s), initial encounter: Secondary | ICD-10-CM | POA: Diagnosis present

## 2017-11-26 MED ORDER — LIDOCAINE HCL (PF) 2 % IJ SOLN: 5.0000 mL | Freq: Once | INTRAMUSCULAR | Status: DC

## 2017-11-26 MED ORDER — LIDOCAINE HCL (PF) 2 % IJ SOLN
INTRAMUSCULAR | Status: AC
  Filled 2017-11-26: qty 20

## 2017-11-26 MED ORDER — HYDROCODONE-ACETAMINOPHEN 5-325 MG PO TABS: 1.0000 | ORAL_TABLET | ORAL | 0 refills | Status: DC | PRN

## 2017-11-26 MED ORDER — HYDROCODONE-ACETAMINOPHEN 5-325 MG PO TABS
1.0000 | ORAL_TABLET | Freq: Once | ORAL | Status: AC
  Administered 2017-11-26: 1 via ORAL
  Filled 2017-11-26: qty 1

## 2017-11-26 MED ORDER — LIDOCAINE HCL (PF) 2 % IJ SOLN
INTRAMUSCULAR | Status: AC
  Filled 2017-11-26: qty 10

## 2017-11-26 NOTE — Discharge Instructions (Signed)
Please keep your wound clean and dry. Change dressing daily. Have sutures removed in 7 or 8 days. See Dr Gerarda Fraction or return to  the ED if any signs of infection.

## 2017-11-26 NOTE — ED Provider Notes (Signed)
[facility] EMERGENCY DEPARTMENT Provider Note   CSN: [id number] Arrival date & time: 11/26/17  1524     History   Chief Complaint Chief Complaint  Patient presents with  . Laceration    HPI ACHSAH MCQUADE is a 52 y.o. female.  Patient is a 52 year old female who presents to the emergency department with a laceration to the right index finger.  The patient states she was slicing squash when she cut her finger.  It is of note that the patient is on Pradaxa as well as aspirin.  She has been able to control the bleeding by applying pressure.  She has not had any recent operations or procedures involving the right index finger.  No other injury reported at this time.  Patient states she thinks her tetanus is up-to-date.  The history is provided by the patient.  Laceration      Past Medical History:  Diagnosis Date  . Asthma   . Cervical cancer ([place])    a. treated with partial removal of cervix  . COPD (chronic obstructive pulmonary disease) ([place])   . Hypertrophic cardiomyopathy ([place])    a. s/p myomectomy at [facility] 08/2004  . Paroxysmal atrial flutter ([facility])    a. post-op after myomectomy in 2006. s/p TEE/DCCV.  . Transient atrial fibrillation ([place]) 2006   a. after myomectomy at [facility] in 08/2004 - was on amiodarone but developed elevated LFTs felt possibly secondary to amiodarone     Patient Active Problem List   Diagnosis Date Noted  . Left carotid bruit 08/27/2015  . COPD exacerbation ([place]) 08/10/2015  . Paroxysmal A-fib ([place]) 08/10/2015  . Hypertrophic obstructive cardiomyopathy ([place]) 06/10/2015  . Atrial flutter with rapid ventricular response ([place]) 06/10/2015  . History of atrial fibrillation 06/10/2015  . Elevated troponin 06/10/2015  . COPD (chronic obstructive pulmonary disease) ([place]) 06/10/2015  . Tobacco abuse 06/10/2015  . Chest pain 06/09/2015  . History of ventricular septal myectomy 06/09/2015  . Palpitations 06/09/2015  . H N P-LUMBAR 09/17/2008  .  DEGENERATIVE DISC DISEASE, LUMBAR SPINE 09/17/2008  . HIGH BLOOD PRESSURE 09/17/2008    Past Surgical History:  Procedure Laterality Date  . CARDIOVERSION N/A 08/12/2015   Procedure: CARDIOVERSION;  Surgeon: Satira Sark, MD;  [facility];  Service: Cardiovascular;  Laterality: N/A;  . Septal myomectomy  2006   [facility]  . TEE WITHOUT CARDIOVERSION N/A 03/29/2016   Procedure: TRANSESOPHAGEAL ECHOCARDIOGRAM (TEE);  Surgeon: Lelon Perla, MD;  Location: [facility] ENDOSCOPY;  Service: Cardiovascular;  Laterality: N/A;  . TEE WITHOUT CARDIOVERSION N/A 06/23/2016   Procedure: TRANSESOPHAGEAL ECHOCARDIOGRAM (TEE);  Surgeon: Thayer Headings, MD;  Location: [place];  Service: Cardiovascular;  Laterality: N/A;  . TUBAL LIGATION       OB History   None      Home Medications    Prior to Admission medications   Medication Sig Start Date End Date Taking? Authorizing Provider  albuterol (VOSPIRE ER) 4 MG 12 hr tablet 4 mg 3 (three) times daily.    [provider]  ALPRAZolam Duanne Moron) 1 MG tablet Take 1 mg by mouth 3 (three) times daily as needed for anxiety.  05/27/15   [provider]  aspirin EC 81 MG tablet Take 81 mg by mouth daily.    [provider]  dabigatran (PRADAXA) 150 MG CAPS capsule Take 1 capsule (150 mg total) by mouth 2 (two) times daily. 03/02/16   Allred, Jeneen Rinks, MD  diltiazem (CARDIZEM CD) 180 MG 24  hr capsule Take 1 capsule (180 mg total) by mouth daily. 05/08/17   [name], NP  diltiazem (CARDIZEM CD) 180 MG 24 hr capsule TAKE (1) CAPSULE BY MOUTH EVERY DAY. 07/03/17   [name], NP  diltiazem (CARDIZEM) 30 MG tablet Take 1 tablet every 4 hours AS NEEDED for afib heart rate over 100 03/13/17   [name], NP  diltiazem (CARDIZEM) 30 MG tablet TAKE (1) TABLET BY MOUTH EVERY FOUR HOURS AS NEEDED FOR AFIB HEART RATE OVER 100. 07/04/17   [name], NP  furosemide (LASIX) 80 MG tablet Take 1 tablet (80 mg total) by  mouth daily. Takes potassium with furosemide. Patient taking differently: Take 80 mg by mouth as needed. Takes potassium with furosemide. 10/05/16   Allred, Jeneen Rinks, MD  HYDROcodone-acetaminophen (NORCO) 10-325 MG tablet Take 1 tablet by mouth every 6 (six) hours as needed for moderate pain or severe pain.  05/27/15   [provider]  ibuprofen (ADVIL,MOTRIN) 800 MG tablet Take 800-1,600 mg by mouth 3 (three) times daily as needed for moderate pain.  11/02/15   [provider]  levalbuterol Penne Lash) 0.63 MG/3ML nebulizer solution Take 0.63 mg by nebulization every 6 (six) hours as needed for wheezing or shortness of breath.  08/25/15   [provider]  metoprolol tartrate (LOPRESSOR) 25 MG tablet Take 0.5 tablets (12.5 mg total) 2 (two) times daily by mouth. 04/20/17   [name], NP  mupirocin cream (BACTROBAN) 2 % Apply 1 application topically daily as needed (wound care).    [provider]  potassium chloride SA (K-DUR,KLOR-CON) 20 MEQ tablet Take 1 tablet by mouth daily as needed. When you take Lasix 11/02/15   [provider]  PROAIR HFA 108 (90 Base) MCG/ACT inhaler Inhale 1-2 puffs into the lungs every 6 (six) hours as needed for wheezing or shortness of breath.  05/27/15   [provider]    Family History Family History  Problem Relation Age of Onset  . Heart failure Father   . Diabetes Unknown   . Lung disease Unknown     Social History Social History   Tobacco Use  . Smoking status: Current Every Day Smoker    Packs/day: 0.50    Types: Cigarettes    Last attempt to quit: 03/28/2016    Years since quitting: 1.6  . Smokeless tobacco: Never Used  . Tobacco comment: since age 18  Substance Use Topics  . Alcohol use: No    Alcohol/week: 0.0 oz  . Drug use: No     Allergies   Amiodarone   Review of Systems Review of Systems  Constitutional: Negative for activity change.       All ROS Neg except as noted in HPI    HENT: Negative for nosebleeds.   Eyes: Negative for photophobia and discharge.  Respiratory: Negative for cough, shortness of breath and wheezing.   Cardiovascular: Positive for palpitations. Negative for chest pain.  Gastrointestinal: Negative for abdominal pain and blood in stool.  Genitourinary: Negative for dysuria, frequency and hematuria.  Musculoskeletal: Negative for arthralgias, back pain and neck pain.  Skin: Negative.   Neurological: Negative for dizziness, seizures and speech difficulty.  Psychiatric/Behavioral: Negative for confusion and hallucinations.     Physical Exam Updated Vital Signs BP 134/80 (BP Location: Left Arm)   Pulse 60   Temp 98 F (36.7 C) (Oral)   Resp 16   Ht 5\' 1"  (1.549 m)   Wt 95.3 kg (  210 lb)   SpO2 96%   BMI 39.68 kg/m   Physical Exam  Constitutional: She is oriented to person, place, and time. She appears well-developed and well-nourished.  Non-toxic appearance.  HENT:  Head: Normocephalic.  Right Ear: Tympanic membrane and external ear normal.  Left Ear: Tympanic membrane and external ear normal.  Eyes: Pupils are equal, round, and reactive to light. EOM and lids are normal.  Neck: Normal range of motion. Neck supple. Carotid bruit is not present.  Cardiovascular: Normal rate, regular rhythm, normal heart sounds, intact distal pulses and normal pulses.  Pulmonary/Chest: Breath sounds normal. No respiratory distress.  Abdominal: Soft. Bowel sounds are normal. There is no tenderness. There is no guarding.  Musculoskeletal: Normal range of motion.  There is a laceration of the tip of the index finger on the right hand  The nail is involved.  No other injury appreciated.  Bleeding controlled at this time.  There is full range of motion of the index finger.  Lymphadenopathy:       Head (right side): No submandibular adenopathy present.       Head (left side): No submandibular adenopathy present.    She has no cervical adenopathy.   Neurological: She is alert and oriented to person, place, and time. She has normal strength. No cranial nerve deficit or sensory deficit.  Skin: Skin is warm and dry.  Psychiatric: She has a normal mood and affect. Her speech is normal.  Nursing note and vitals reviewed.    ED Treatments / Results  Labs (all labs ordered are listed, but only abnormal results are displayed) Labs Reviewed - No data to display  EKG None  Radiology No results found.  Procedures .Marland KitchenLaceration Repair Date/Time: 11/26/2017 4:40 PM Performed by: Lily Kocher, PA-C Authorized by: Lily Kocher, PA-C   Consent:    Consent obtained:  Verbal   Consent given by:  Patient   Risks discussed:  Infection, poor cosmetic result and poor wound healing Anesthesia (see MAR for exact dosages):    Anesthesia method:  Local infiltration   Local anesthetic:  Lidocaine 1% w/o epi Laceration details:    Location:  Finger   Finger location:  R index finger   Length (cm):  1.3 Repair type:    Repair type:  Simple Pre-procedure details:    Preparation:  Patient was prepped and draped in usual sterile fashion Exploration:    Wound exploration: wound explored through full range of motion     Wound extent: no nerve damage noted and no tendon damage noted   Treatment:    Area cleansed with:  Betadine   Amount of cleaning:  Standard   Irrigation solution:  Sterile saline Skin repair:    Repair method:  Sutures   Suture size:  4-0   Suture material:  Nylon   Suture technique:  Simple interrupted   Number of sutures:  4 Approximation:    Approximation:  Close Post-procedure details:    Dressing:  Sterile dressing   (including critical care time)  Medications Ordered in ED Medications  lidocaine (XYLOCAINE) 2 % injection 5 mL (has no administration in time range)     Initial Impression / Assessment and Plan / ED Course  I have reviewed the triage vital signs and the nursing notes.  Pertinent labs &  imaging results that were available during my care of the patient were reviewed by me and considered in my medical decision making (see chart for details).  Final Clinical Impressions(s) / ED Diagnoses MDM  Pt sustained a laceration to the tip of the right index finger. Pt on anticoag meds. Bleeding controlled by applying pressure. Wound repaired with 4 sutures of 4-0 nylon. Pt advised to keep wound clean and dry. Pt to have sutures removed in 7 or 8 days. Pt in agreement with plan.   Final diagnoses:  Laceration of right index finger, foreign body presence unspecified, nail damage status unspecified, initial encounter    ED Discharge Orders        Ordered    HYDROcodone-acetaminophen (NORCO/VICODIN) 5-325 MG tablet  Every 4 hours PRN     11/26/17 1639       Lily Kocher, PA-C 11/26/17 [place], [place], DO 11/30/17 1646

## 2017-11-26 NOTE — ED Triage Notes (Signed)
Patient has laceration to right, index finger-tip. Per patient cut fingers on medal mandelin while slicing squash. Patient unsure of last tetanus vaccination. Patient takes pradaxa and aspirin.

## 2017-11-27 ENCOUNTER — Other Ambulatory Visit: Payer: Self-pay

## 2017-11-30 DIAGNOSIS — I10 Essential (primary) hypertension: Secondary | ICD-10-CM | POA: Diagnosis not present

## 2017-11-30 DIAGNOSIS — J449 Chronic obstructive pulmonary disease, unspecified: Secondary | ICD-10-CM | POA: Diagnosis not present

## 2017-11-30 DIAGNOSIS — Z1389 Encounter for screening for other disorder: Secondary | ICD-10-CM | POA: Diagnosis not present

## 2017-12-05 DIAGNOSIS — Z4802 Encounter for removal of sutures: Secondary | ICD-10-CM | POA: Diagnosis not present

## 2017-12-05 DIAGNOSIS — S61210D Laceration without foreign body of right index finger without damage to nail, subsequent encounter: Secondary | ICD-10-CM | POA: Diagnosis not present

## 2018-02-28 ENCOUNTER — Other Ambulatory Visit (HOSPITAL_COMMUNITY): Payer: Self-pay | Admitting: Nurse Practitioner

## 2018-03-08 DIAGNOSIS — Z1389 Encounter for screening for other disorder: Secondary | ICD-10-CM | POA: Diagnosis not present

## 2018-03-08 DIAGNOSIS — J449 Chronic obstructive pulmonary disease, unspecified: Secondary | ICD-10-CM | POA: Diagnosis not present

## 2018-03-08 DIAGNOSIS — I2510 Atherosclerotic heart disease of native coronary artery without angina pectoris: Secondary | ICD-10-CM | POA: Diagnosis not present

## 2018-03-08 DIAGNOSIS — Z23 Encounter for immunization: Secondary | ICD-10-CM | POA: Diagnosis not present

## 2018-03-08 DIAGNOSIS — I10 Essential (primary) hypertension: Secondary | ICD-10-CM | POA: Diagnosis not present

## 2018-03-08 DIAGNOSIS — Z0001 Encounter for general adult medical examination with abnormal findings: Secondary | ICD-10-CM | POA: Diagnosis not present

## 2018-03-14 ENCOUNTER — Other Ambulatory Visit (HOSPITAL_COMMUNITY): Payer: Self-pay | Admitting: Internal Medicine

## 2018-03-14 DIAGNOSIS — Z1231 Encounter for screening mammogram for malignant neoplasm of breast: Secondary | ICD-10-CM

## 2018-05-31 DIAGNOSIS — J329 Chronic sinusitis, unspecified: Secondary | ICD-10-CM | POA: Diagnosis not present

## 2018-05-31 DIAGNOSIS — G894 Chronic pain syndrome: Secondary | ICD-10-CM | POA: Diagnosis not present

## 2018-05-31 DIAGNOSIS — I10 Essential (primary) hypertension: Secondary | ICD-10-CM | POA: Diagnosis not present

## 2018-05-31 DIAGNOSIS — I4891 Unspecified atrial fibrillation: Secondary | ICD-10-CM | POA: Diagnosis not present

## 2018-06-07 ENCOUNTER — Other Ambulatory Visit (HOSPITAL_COMMUNITY): Payer: Self-pay | Admitting: Internal Medicine

## 2018-06-07 DIAGNOSIS — Z1231 Encounter for screening mammogram for malignant neoplasm of breast: Secondary | ICD-10-CM

## 2018-06-11 ENCOUNTER — Telehealth (HOSPITAL_COMMUNITY): Payer: Self-pay | Admitting: *Deleted

## 2018-06-11 MED ORDER — APIXABAN 5 MG PO TABS: 5.0000 mg | ORAL_TABLET | Freq: Two times a day (BID) | ORAL | 3 refills | Status: DC

## 2018-06-11 NOTE — Telephone Encounter (Signed)
Patient been very stable on pradaxa for over 6 months her insurance notified they will no longer cover in January 2020 - her PCP switched her to xarelto about 3 weeks ago and she is having a lot of afib some rvr. She had this same "reaction" in the past with xarelto. Pt would like to try switching to Eliquis 5mg  twice a day as her insurance will cover this and see if this will settle down her afib. Pt will call back if AF continues despite change.

## 2018-06-21 ENCOUNTER — Ambulatory Visit (HOSPITAL_COMMUNITY)
Admission: RE | Admit: 2018-06-21 | Discharge: 2018-06-21 | Disposition: A | Discharge status: Home / Self Care | Payer: Medicare Other | Source: Ambulatory Visit | Attending: Internal Medicine | Admitting: Internal Medicine

## 2018-06-21 DIAGNOSIS — Z1231 Encounter for screening mammogram for malignant neoplasm of breast: Secondary | ICD-10-CM | POA: Diagnosis not present

## 2018-06-27 ENCOUNTER — Telehealth (HOSPITAL_COMMUNITY): Payer: Self-pay | Admitting: *Deleted

## 2018-06-27 MED ORDER — DABIGATRAN ETEXILATE MESYLATE 150 MG PO CAPS: 150.0000 mg | ORAL_CAPSULE | Freq: Two times a day (BID) | ORAL | 6 refills | Status: AC

## 2018-06-27 NOTE — Telephone Encounter (Signed)
Pt approved for non-formulary exception through 06/13/2019 PA #[id number]  Patient will be switched back to pradaxa from eliquis which was causing the patient headaches.

## 2018-07-05 DIAGNOSIS — Z1389 Encounter for screening for other disorder: Secondary | ICD-10-CM | POA: Diagnosis not present

## 2018-07-05 DIAGNOSIS — G894 Chronic pain syndrome: Secondary | ICD-10-CM | POA: Diagnosis not present

## 2018-08-01 DIAGNOSIS — I421 Obstructive hypertrophic cardiomyopathy: Secondary | ICD-10-CM | POA: Diagnosis not present

## 2018-08-01 DIAGNOSIS — J329 Chronic sinusitis, unspecified: Secondary | ICD-10-CM | POA: Diagnosis not present

## 2018-08-01 DIAGNOSIS — E7849 Other hyperlipidemia: Secondary | ICD-10-CM | POA: Diagnosis not present

## 2018-08-01 DIAGNOSIS — J449 Chronic obstructive pulmonary disease, unspecified: Secondary | ICD-10-CM | POA: Diagnosis not present

## 2018-08-01 DIAGNOSIS — Z79891 Long term (current) use of opiate analgesic: Secondary | ICD-10-CM | POA: Diagnosis not present

## 2018-08-01 DIAGNOSIS — G894 Chronic pain syndrome: Secondary | ICD-10-CM | POA: Diagnosis not present

## 2018-08-01 DIAGNOSIS — I4891 Unspecified atrial fibrillation: Secondary | ICD-10-CM | POA: Diagnosis not present

## 2018-09-03 DIAGNOSIS — G894 Chronic pain syndrome: Secondary | ICD-10-CM | POA: Diagnosis not present

## 2018-09-03 DIAGNOSIS — Z0001 Encounter for general adult medical examination with abnormal findings: Secondary | ICD-10-CM | POA: Diagnosis not present

## 2018-09-03 DIAGNOSIS — Z1389 Encounter for screening for other disorder: Secondary | ICD-10-CM | POA: Diagnosis not present

## 2018-09-25 DIAGNOSIS — I10 Essential (primary) hypertension: Secondary | ICD-10-CM | POA: Diagnosis not present

## 2018-09-25 DIAGNOSIS — G894 Chronic pain syndrome: Secondary | ICD-10-CM | POA: Diagnosis not present

## 2018-10-11 DIAGNOSIS — J441 Chronic obstructive pulmonary disease with (acute) exacerbation: Secondary | ICD-10-CM | POA: Diagnosis not present

## 2018-10-11 DIAGNOSIS — J449 Chronic obstructive pulmonary disease, unspecified: Secondary | ICD-10-CM | POA: Diagnosis not present

## 2018-10-17 ENCOUNTER — Ambulatory Visit (HOSPITAL_COMMUNITY)
Admission: RE | Admit: 2018-10-17 | Discharge: 2018-10-17 | Disposition: A | Discharge status: Home / Self Care | Payer: Medicare Other | Source: Ambulatory Visit | Attending: Nurse Practitioner | Admitting: Nurse Practitioner

## 2018-10-17 ENCOUNTER — Encounter (HOSPITAL_COMMUNITY): Payer: Self-pay | Admitting: Nurse Practitioner

## 2018-10-17 ENCOUNTER — Other Ambulatory Visit: Payer: Self-pay

## 2018-10-17 VITALS — BP 116/79 | HR 87 | Ht 61.0 in | Wt 215.0 lb

## 2018-10-17 DIAGNOSIS — R601 Generalized edema: Secondary | ICD-10-CM

## 2018-10-17 DIAGNOSIS — Z8679 Personal history of other diseases of the circulatory system: Secondary | ICD-10-CM

## 2018-10-17 NOTE — Addendum Note (Signed)
Encounter addended by: [name], NP on: 10/17/2018 4:10 PM  Actions taken: Clinical Note Signed

## 2018-10-17 NOTE — Progress Notes (Signed)
Electrophysiology TeleHealth Note   Due to national recommendations of social distancing due to [place] 19, Audio/video telehealth visit is felt to be most appropriate for this patient at this time.  See MyChart message/consent below  from today for patient consent regarding telehealth for the At[facility] Fibrillation Clinic.    Date:  10/17/2018   ID:  Kathleen Wright, DOB [birth year], MRN [id number]  Location: home  Provider location: [address] [place], [place] [address] Evaluation Performed: Evaluation for increased  PCP:  Redmond School, MD  Primary Cardiologist:  Dr. Jacelyn Grip at [facility] 2016 Primary Electrophysiologist: Dr.  [name] and most recently 2018 Dr. Dwana Curd at [facility]   CC:  Swelling/ increased HR's for several weeks   History of Present Illness: Kathleen Wright is a 53 y.o. female who presents via  video conferencing for a telehealth visit today.     I have not seen the pt since 12/ 2018 for a f/u after ablation at [facility] by Dr. Dwana Curd. She was suppose to see them f/u in March 2019 but failed to go.She has a h/o HCM and is s/p septal myomectomy at [facility] in 2006.  She has had multiple episodes of atypical atrial flutter since 12/16 for which she has had several hospitalizations and has required cardioversions.   Last ablation fall of 2018. She has not seen any cardiologist since that time. Was seen by PCP for a cortisone shot and antibiotic 2 weeks ago at which time she was swelling but did not mention to him. She reports that she has noted increase intermittent HR's for several weeks and states her weight 2 months ago was 190 lbs and today weighed at 215 lbs. She has lasix to take as needed but has just taken a random dose.   Today, she denies symptoms of  chest pain,+ chronic  shortness of breath, orthopnea, PND, + lower extremity edema, claudication, dizziness, presyncope, syncope, bleeding, or neurologic sequela. The patient is tolerating medications without difficulties.   she denies  symptoms of cough, fevers, chills, or new SOB worrisome for COVID 19.    Atrial Fibrillation Risk Factors: she does not have symptoms or diagnosis of sleep apnea. she does not have a history of rheumatic fever. she does not have a history of alcohol use. The patient does not have a history of early familial atrial fibrillation or other arrhythmias.  she has a BMI of Body mass index is 40.62 kg/m.Marland Kitchen Filed Weights   10/17/18 1426  Weight: 97.5 kg    Past Medical History:  Diagnosis Date  . Asthma   . Cervical cancer ([place])    a. treated with partial removal of cervix  . COPD (chronic obstructive pulmonary disease) ([place])   . Hypertrophic cardiomyopathy ([place])    a. s/p myomectomy at [facility] 08/2004  . Paroxysmal atrial flutter ([facility])    a. post-op after myomectomy in 2006. s/p TEE/DCCV.  . Transient atrial fibrillation ([place]) 2006   a. after myomectomy at [facility] in 08/2004 - was on amiodarone but developed elevated LFTs felt possibly secondary to amiodarone    Past Surgical History:  Procedure Laterality Date  . CARDIOVERSION N/A 08/12/2015   Procedure: CARDIOVERSION;  Surgeon: Satira Sark, MD;  [facility];  Service: Cardiovascular;  Laterality: N/A;  . Septal myomectomy  2006   [facility]  . TEE WITHOUT CARDIOVERSION N/A 03/29/2016   Procedure: TRANSESOPHAGEAL ECHOCARDIOGRAM (TEE);  Surgeon: Lelon Perla, MD;  Location: [place];  Service: Cardiovascular;  Laterality: N/A;  . TEE WITHOUT CARDIOVERSION N/A 06/23/2016   Procedure: TRANSESOPHAGEAL ECHOCARDIOGRAM (TEE);  Surgeon: Thayer Headings, MD;  Location: [place];  Service: Cardiovascular;  Laterality: N/A;  . TUBAL LIGATION       Current Outpatient Medications  Medication Sig Dispense Refill  . albuterol (VOSPIRE ER) 4 MG 12 hr tablet 4 mg 3 (three) times daily.    Marland Kitchen ALPRAZolam (XANAX) 1 MG tablet Take 1 mg by mouth 3 (three) times daily as needed for anxiety.     Marland Kitchen aspirin EC 81 MG tablet Take 81 mg by  mouth daily.    . dabigatran (PRADAXA) 150 MG CAPS capsule Take 1 capsule (150 mg total) by mouth 2 (two) times daily. 60 capsule 6  . diltiazem (CARDIZEM CD) 180 MG 24 hr capsule Take 1 capsule (180 mg total) by mouth daily. 30 capsule 2  . diltiazem (CARDIZEM) 30 MG tablet Take 1 tablet every 4 hours AS NEEDED for afib heart rate over 100 45 tablet 1  . furosemide (LASIX) 20 MG tablet Take 20 mg by mouth 2 (two) times daily.    Marland Kitchen HYDROcodone-acetaminophen (NORCO/VICODIN) 5-325 MG tablet Take 1 tablet by mouth every 4 (four) hours as needed. 10 tablet 0  . ibuprofen (ADVIL,MOTRIN) 800 MG tablet Take 800-1,600 mg by mouth 3 (three) times daily as needed for moderate pain.     Marland Kitchen levalbuterol (XOPENEX) 0.63 MG/3ML nebulizer solution Take 0.63 mg by nebulization every 6 (six) hours as needed for wheezing or shortness of breath.     . metoprolol tartrate (LOPRESSOR) 25 MG tablet Take 50 mg by mouth 2 (two) times daily.    . mupirocin cream (BACTROBAN) 2 % Apply 1 application topically daily as needed (wound care).    . potassium chloride SA (K-DUR,KLOR-CON) 20 MEQ tablet Take 1 tablet by mouth daily as needed. When you take Lasix    . PROAIR HFA 108 (90 Base) MCG/ACT inhaler Inhale 1-2 puffs into the lungs every 6 (six) hours as needed for wheezing or shortness of breath.      No current facility-administered medications for this encounter.     Allergies:   Amiodarone   Social History:  The patient  reports that she has been smoking cigarettes. She has been smoking about 0.50 packs per day. She has never used smokeless tobacco. She reports that she does not drink alcohol or use drugs.   Family History:  The patient's  family history includes Diabetes in her unknown relative; Heart failure in her father; Lung disease in her unknown relative.    ROS:  Please see the history of present illness.   All other systems are personally reviewed and negative.   Exam: Well appearing, alert and conversant,  regular work of breathing,  good skin color   Recent Labs: No results found for requested labs within last 8760 hours.  personally reviewed    Other studies personally reviewed: Epic records including care everywhere  The pt has a wearable watch but just shows HR's    ASSESSMENT AND PLAN:  1. H/o afib/flutter with last ablation fall of 2018 No f/u with cardiology  x 18 months Failed to go back for f/u with Dr.  Mina Marble Cotinue cardizem 180 mg daily Continue metoprolol at 50 mg bid Will use 30 mg cardizem as needed zio patch will be sent to her to wear x one week Echo to f/u [place] when covid restrictions relax, hopefully within the month Continue asa and pradaxa (  on both for h/o left atrium thrombus) This patients CHA2DS2-VASc Score and unadjusted Ischemic Stroke Rate (% per year) is equal to 3.2 % stroke rate/year from a score of 3  Above score calculated as 1 point each if present [CHF, HTN, DM, Vascular=MI/PAD/Aortic Plaque, Age if 65-74, or Female] Above score calculated as 2 points each if present [Age > 75, or Stroke/TIA/TE]  2.Edema Take prn lasix 20 mg bid thru next Monday Add K+ 20 meq daily Weigh  Daily Avoid salt  COVID screen The patient does not have any symptoms that suggest any further testing/ screening at this time.  Social distancing reinforced today.     Follow-up: Monday may 11th at 2:30 pm, virtual audio visit She will ultimately need referral back to cardiology /echo/moniotr will determine if at [facility] or locally Current medicines are reviewed at length with the patient today.     Labs/ tests ordered today include: zio patch/echo No orders of the defined types were placed in this encounter.   Patient Risk:  after full review of this patients clinical status, I feel that they are at  Moderate  risk at this time.   Today, I have spent minutes with the patient with telehealth technology discussing 20 mins  Signed, Roderic Palau NP  10/17/2018 3:32 PM   Afib [facility] [address] [place], [place] [address] [phone]   I hereby voluntarily request, consent and authorize the [facility] and its employed or contracted physicians, physician assistants, nurse practitioners or other licensed health care professionals (the Practitioner), to provide me with telemedicine health care services (the "Services") as deemed necessary by the treating Practitioner. I acknowledge and consent to receive the Services by the Practitioner via telemedicine. I understand that the telemedicine visit will involve communicating with the Practitioner through live audiovisual communication technology and the disclosure of certain medical information by electronic transmission. I acknowledge that I have been given the opportunity to request an in-person assessment or other available alternative prior to the telemedicine visit and am voluntarily participating in the telemedicine visit.   I understand that I have the right to withhold or withdraw my consent to the use of telemedicine in the course of my care at any time, without affecting my right to future care or treatment, and that the Practitioner or I may terminate the telemedicine visit at any time. I understand that I have the right to inspect all information obtained and/or recorded in the course of the telemedicine visit and may receive copies of available information for a reasonable fee.  I understand that some of the potential risks of receiving the Services via telemedicine include:   Delay or interruption in medical evaluation due to technological equipment failure or disruption;  Information transmitted may not be sufficient (e.g. poor resolution of images) to allow for appropriate medical decision making by the Practitioner; and/or  In rare instances, security protocols could fail, causing a breach of personal health information.   Furthermore, I acknowledge that it is my  responsibility to provide information about my medical history, conditions and care that is complete and accurate to the best of my ability. I acknowledge that Practitioner's advice, recommendations, and/or decision may be based on factors not within their control, such as incomplete or inaccurate data provided by me or distortions of diagnostic images or specimens that may result from electronic transmissions. I understand that the practice of medicine is not an exact science and that Practitioner makes no warranties or  guarantees regarding treatment outcomes. I acknowledge that I will receive a copy of this consent concurrently upon execution via email to the email address I last provided but may also request a printed copy by calling the office of the [facility].  I understand that my insurance will be billed for this visit.   I have read or had this consent read to me.  I understand the contents of this consent, which adequately explains the benefits and risks of the Services being provided via telemedicine.  I have been provided ample opportunity to ask questions regarding this consent and the Services and have had my questions answered to my satisfaction.  I give my informed consent for the services to be provided through the use of telemedicine in my medical care  By participating in this telemedicine visit I agree to the above.

## 2018-10-17 NOTE — Progress Notes (Signed)
Electrophysiology TeleHealth Note   Due to national recommendations of social distancing due to [place] 19, Audio/video telehealth visit is felt to be most appropriate for this patient at this time.  See MyChart message/consent below  from today for patient consent regarding telehealth for the Atrial Fibrillation Clinic.    Date:  10/17/2018   ID:  Kathleen Wright, DOB 1965/12/19, MRN [id number]  Location: home  Provider location: [address] [place], [place] [address] Evaluation Performed: Evaluation for increased  PCP:  Redmond School, MD  Primary Cardiologist:  Dr. Jacelyn Grip at [facility] 2016 Primary Electrophysiologist: Dr.  [name] and most recently 2018 Dr. Dwana Curd at [facility]   CC:  Swelling/ increased HR's for several weeks   History of Present Illness: Kathleen Wright is a 53 y.o. female who presents via  video conferencing for a telehealth visit today.     I have not seen the pt since 12/ 2018 for a f/u after ablation at [facility] by Dr. Dwana Curd. She was suppose to see them f/u in March 2019 but failed to go.She has a h/o HCM and is s/p septal myomectomy at [facility] in 2006.  She has had multiple episodes of atypical atrial flutter since 12/16 for which she has had several hospitalizations and has required cardioversions.   Last ablation fall of 2018. She has not seen any cardiologist since that time. Was seen by PCP for a cortisone shot and antibiotic 2 weeks ago at which time she was swelling but did not mention to him. She reports that she has noted increase intermittent HR's for several weeks and states her weight 2 months ago was 190 lbs and today weighed at 215 lbs. She has lasix to take as needed but has just taken a random dose.   Today, she denies symptoms of  chest pain,+ chronic  shortness of breath, orthopnea, PND, + lower extremity edema, claudication, dizziness, presyncope, syncope, bleeding, or neurologic sequela. The patient is tolerating medications without difficulties.   she denies  symptoms of cough, fevers, chills, or new SOB worrisome for COVID 19.    Atrial Fibrillation Risk Factors: she does not have symptoms or diagnosis of sleep apnea. she does not have a history of rheumatic fever. she does not have a history of alcohol use. The patient does not have a history of early familial atrial fibrillation or other arrhythmias.  she has a BMI of Body mass index is 40.62 kg/m.Marland Kitchen Filed Weights   10/17/18 1426  Weight: 97.5 kg    Past Medical History:  Diagnosis Date  . Asthma   . Cervical cancer ([place])    a. treated with partial removal of cervix  . COPD (chronic obstructive pulmonary disease) ([place])   . Hypertrophic cardiomyopathy ([place])    a. s/p myomectomy at [facility] 08/2004  . Paroxysmal atrial flutter (H[facility])    a. post-op after myomectomy in 2006. s/p TEE/DCCV.  . Transient atrial fibrillation ([place]) 2006   a. after myomectomy at [facility] in 08/2004 - was on amiodarone but developed elevated LFTs felt possibly secondary to amiodarone    Past Surgical History:  Procedure Laterality Date  . CARDIOVERSION N/A 08/12/2015   Procedure: CARDIOVERSION;  Surgeon: Satira Sark, MD;  [facility];  Service: Cardiovascular;  Laterality: N/A;  . Septal myomectomy  2006   [facility]  . TEE WITHOUT CARDIOVERSION N/A 03/29/2016   Procedure: TRANSESOPHAGEAL ECHOCARDIOGRAM (TEE);  Surgeon: Lelon Perla, MD;  Location: [place];  Service: Cardiovascular;  Laterality: N/A;  . TEE WITHOUT CARDIOVERSION N/A 06/23/2016   Procedure: TRANSESOPHAGEAL ECHOCARDIOGRAM (TEE);  Surgeon: Thayer Headings, MD;  Location: [place];  Service: Cardiovascular;  Laterality: N/A;  . TUBAL LIGATION       Current Outpatient Medications  Medication Sig Dispense Refill  . albuterol (VOSPIRE ER) 4 MG 12 hr tablet 4 mg 3 (three) times daily.    Marland Kitchen ALPRAZolam (XANAX) 1 MG tablet Take 1 mg by mouth 3 (three) times daily as needed for anxiety.     Marland Kitchen aspirin EC 81 MG tablet Take 81 mg by  mouth daily.    . dabigatran (PRADAXA) 150 MG CAPS capsule Take 1 capsule (150 mg total) by mouth 2 (two) times daily. 60 capsule 6  . diltiazem (CARDIZEM CD) 180 MG 24 hr capsule Take 1 capsule (180 mg total) by mouth daily. 30 capsule 2  . diltiazem (CARDIZEM) 30 MG tablet Take 1 tablet every 4 hours AS NEEDED for afib heart rate over 100 45 tablet 1  . furosemide (LASIX) 20 MG tablet Take 20 mg by mouth 2 (two) times daily.    Marland Kitchen HYDROcodone-acetaminophen (NORCO/VICODIN) 5-325 MG tablet Take 1 tablet by mouth every 4 (four) hours as needed. 10 tablet 0  . ibuprofen (ADVIL,MOTRIN) 800 MG tablet Take 800-1,600 mg by mouth 3 (three) times daily as needed for moderate pain.     Marland Kitchen levalbuterol (XOPENEX) 0.63 MG/3ML nebulizer solution Take 0.63 mg by nebulization every 6 (six) hours as needed for wheezing or shortness of breath.     . metoprolol tartrate (LOPRESSOR) 25 MG tablet Take 50 mg by mouth 2 (two) times daily.    . mupirocin cream (BACTROBAN) 2 % Apply 1 application topically daily as needed (wound care).    . potassium chloride SA (K-DUR,KLOR-CON) 20 MEQ tablet Take 1 tablet by mouth daily as needed. When you take Lasix    . PROAIR HFA 108 (90 Base) MCG/ACT inhaler Inhale 1-2 puffs into the lungs every 6 (six) hours as needed for wheezing or shortness of breath.      No current facility-administered medications for this encounter.     Allergies:   Amiodarone   Social History:  The patient  reports that she has been smoking cigarettes. She has been smoking about 0.50 packs per day. She has never used smokeless tobacco. She reports that she does not drink alcohol or use drugs.   Family History:  The patient's  family history includes Diabetes in her unknown relative; Heart failure in her father; Lung disease in her unknown relative.    ROS:  Please see the history of present illness.   All other systems are personally reviewed and negative.   Exam: Well appearing, alert and conversant,  regular work of breathing,  good skin color   Recent Labs: No results found for requested labs within last 8760 hours.  personally reviewed    Other studies personally reviewed: Epic records including care everywhere  The pt has a wearable watch but just shows HR's    ASSESSMENT AND PLAN:  1. H/o afib/flutter with last ablation fall of 2018 No f/u with cardiology  x 18 months Failed to go back for f/u with Dr.  Mina Marble Cotinue cardizem 180 mg daily Continue metoprolol at 50 mg bid Will use 30 mg cardizem as needed zio patch will be sent to her to wear x one week Echo to f/u [place] when covid restrictions relax, hopefully within the month Continue asa and pradaxa (  on both for h/o left atrium thrombus) This patients CHA2DS2-VASc Score and unadjusted Ischemic Stroke Rate (% per year) is equal to 3.2 % stroke rate/year from a score of 3  Above score calculated as 1 point each if present [CHF, HTN, DM, Vascular=MI/PAD/Aortic Plaque, Age if 65-74, or Female] Above score calculated as 2 points each if present [Age > 75, or Stroke/TIA/TE]  2.Edema Take prn lasix 20 mg bid thru next Monday Add K+ 20 meq daily Weigh  Daily Avoid salt  COVID screen The patient does not have any symptoms that suggest any further testing/ screening at this time.  Social distancing reinforced today.     Follow-up: Monday may 11th at 2:30 pm, virtual audio visit She will ultimately need referral back to cardiology /echo/moniotr will determine if at [facility] or locally Current medicines are reviewed at length with the patient today.     Labs/ tests ordered today include: zio patch/echo Orders Placed This Encounter  Procedures  . LONG TERM MONITOR (3-14 DAYS)    Patient Risk:  after full review of this patients clinical status, I feel that they are at  Moderate  risk at this time.   Today, I have spent minutes with the patient with telehealth technology discussing 25 mins  Signed, Roderic Palau NP   10/17/2018 4:09 PM  Afib [facility] [address] [place], [place] [address] [phone]   I hereby voluntarily request, consent and authorize the [facility] and its employed or contracted physicians, physician assistants, nurse practitioners or other licensed health care professionals (the Practitioner), to provide me with telemedicine health care services (the "Services") as deemed necessary by the treating Practitioner. I acknowledge and consent to receive the Services by the Practitioner via telemedicine. I understand that the telemedicine visit will involve communicating with the Practitioner through live audiovisual communication technology and the disclosure of certain medical information by electronic transmission. I acknowledge that I have been given the opportunity to request an in-person assessment or other available alternative prior to the telemedicine visit and am voluntarily participating in the telemedicine visit.   I understand that I have the right to withhold or withdraw my consent to the use of telemedicine in the course of my care at any time, without affecting my right to future care or treatment, and that the Practitioner or I may terminate the telemedicine visit at any time. I understand that I have the right to inspect all information obtained and/or recorded in the course of the telemedicine visit and may receive copies of available information for a reasonable fee.  I understand that some of the potential risks of receiving the Services via telemedicine include:   Delay or interruption in medical evaluation due to technological equipment failure or disruption;  Information transmitted may not be sufficient (e.g. poor resolution of images) to allow for appropriate medical decision making by the Practitioner; and/or  In rare instances, security protocols could fail, causing a breach of personal health information.   Furthermore, I  acknowledge that it is my responsibility to provide information about my medical history, conditions and care that is complete and accurate to the best of my ability. I acknowledge that Practitioner's advice, recommendations, and/or decision may be based on factors not within their control, such as incomplete or inaccurate data provided by me or distortions of diagnostic images or specimens that may result from electronic transmissions. I understand that the practice of medicine is not an exact science and that Practitioner makes  no warranties or guarantees regarding treatment outcomes. I acknowledge that I will receive a copy of this consent concurrently upon execution via email to the email address I last provided but may also request a printed copy by calling the office of the [facility].  I understand that my insurance will be billed for this visit.   I have read or had this consent read to me.  I understand the contents of this consent, which adequately explains the benefits and risks of the Services being provided via telemedicine.  I have been provided ample opportunity to ask questions regarding this consent and the Services and have had my questions answered to my satisfaction.  I give my informed consent for the services to be provided through the use of telemedicine in my medical care  By participating in this telemedicine visit I agree to the above.

## 2018-10-22 ENCOUNTER — Other Ambulatory Visit: Payer: Self-pay

## 2018-10-22 ENCOUNTER — Other Ambulatory Visit (HOSPITAL_COMMUNITY): Payer: Self-pay | Admitting: *Deleted

## 2018-10-22 ENCOUNTER — Ambulatory Visit (HOSPITAL_COMMUNITY)
Admission: RE | Admit: 2018-10-22 | Discharge: 2018-10-22 | Disposition: A | Discharge status: Home / Self Care | Payer: Medicare Other | Source: Ambulatory Visit | Attending: Nurse Practitioner | Admitting: Nurse Practitioner

## 2018-10-22 DIAGNOSIS — I480 Paroxysmal atrial fibrillation: Secondary | ICD-10-CM

## 2018-10-22 NOTE — Progress Notes (Signed)
Electrophysiology TeleHealth Note   Due to national recommendations of social distancing due to [place] 19, Audio/video telehealth visit is felt to be most appropriate for this patient at this time.  See MyChart message/consent below  from today for patient consent regarding telehealth for the Atrial Fibrillation Clinic.    Date:  10/22/2018   ID:  Kathleen Wright, DOB 10-10-1965, MRN [id number]  Location: home  Provider location: [address] [place], [place] [address] Evaluation Performed: Evaluation for increased  PCP:  Redmond School, MD  Primary Cardiologist:  Dr. Jacelyn Grip at [facility] 2016 Primary Electrophysiologist: Dr.  [name] and most recently 2018 Dr. Dwana Curd at [facility]   CC:  Swelling/ increased HR's for several weeks   History of Present Illness: Kathleen Wright is a 53 y.o. female who presents via  video conferencing for a telehealth visit today.     I have not seen the pt since 12/ 2018 for a f/u after ablation at [facility] by Dr. Dwana Curd. She was suppose to see them f/u in March 2019 but failed to go.She has a h/o HCM and is s/p septal myomectomy at [facility] in 2006.  She has had multiple episodes of atypical atrial flutter since 12/16 for which she has had several hospitalizations and has required cardioversions.   Last ablation fall of 2018. She has not seen any cardiologist since that time. Was seen by PCP for a cortisone shot and antibiotic 2 weeks ago at which time she was swelling but did not mention to him. She reports that she has noted increase intermittent HR's for several weeks and states her weight 2 months ago was 190 lbs and today weighed at 215 lbs. She has lasix to take as needed but has just taken a random dose.   Today, she denies symptoms of  chest pain,+ chronic  shortness of breath, orthopnea, PND, + lower extremity edema, claudication, dizziness, presyncope, syncope, bleeding, or neurologic sequela. The patient is tolerating medications without difficulties.   she denies  symptoms of cough, fevers, chills, or new SOB worrisome for COVID 19.  F/u with pt, 5/11. She is feeling better with a loss of 13 lbs with daily lasix.Marland Kitchen Shortness of breath much improved. She still feels that she is going in and out of afib. She is pending a 7 day Zio patch but has not received thru the mail. Video was connected but no voice so had to continue with an audio visit.   Atrial Fibrillation Risk Factors: she does not have symptoms or diagnosis of sleep apnea. she does not have a history of rheumatic fever. she does not have a history of alcohol use. The patient does not have a history of early familial atrial fibrillation or other arrhythmias.  she has a BMI of There is no height or weight on file to calculate BMI.. There were no vitals filed for this visit.  Past Medical History:  Diagnosis Date   Asthma    Cervical cancer ([place])    a. treated with partial removal of cervix   COPD (chronic obstructive pulmonary disease) ([place])    Hypertrophic cardiomyopathy ([place])    a. s/p myomectomy at [facility] 08/2004   Paroxysmal atrial flutter (HCC)    a. post-op after myomectomy in 2006. s/p TEE/DCCV.   Transient atrial fibrillation ([place]) 2006   a. after myomectomy at [facility] in 08/2004 - was on amiodarone but developed elevated LFTs felt possibly secondary to amiodarone    Past Surgical History:  Procedure  Laterality Date   CARDIOVERSION N/A 08/12/2015   Procedure: CARDIOVERSION;  Surgeon: Satira Sark, MD;  [facility];  Service: Cardiovascular;  Laterality: N/A;   Septal myomectomy  2006   [facility]   TEE WITHOUT CARDIOVERSION N/A 03/29/2016   Procedure: TRANSESOPHAGEAL ECHOCARDIOGRAM (TEE);  Surgeon: Lelon Perla, MD;  Location: [facility] ENDOSCOPY;  Service: Cardiovascular;  Laterality: N/A;   TEE WITHOUT CARDIOVERSION N/A 06/23/2016   Procedure: TRANSESOPHAGEAL ECHOCARDIOGRAM (TEE);  Surgeon: Thayer Headings, MD;  Location: [facility] ENDOSCOPY;  Service: Cardiovascular;   Laterality: N/A;   TUBAL LIGATION       Current Outpatient Medications  Medication Sig Dispense Refill   albuterol (VOSPIRE ER) 4 MG 12 hr tablet 4 mg 3 (three) times daily.     ALPRAZolam (XANAX) 1 MG tablet Take 1 mg by mouth 3 (three) times daily as needed for anxiety.      aspirin EC 81 MG tablet Take 81 mg by mouth daily.     dabigatran (PRADAXA) 150 MG CAPS capsule Take 1 capsule (150 mg total) by mouth 2 (two) times daily. 60 capsule 6   diltiazem (CARDIZEM CD) 180 MG 24 hr capsule Take 1 capsule (180 mg total) by mouth daily. 30 capsule 2   diltiazem (CARDIZEM) 30 MG tablet Take 1 tablet every 4 hours AS NEEDED for afib heart rate over 100 45 tablet 1   furosemide (LASIX) 20 MG tablet Take 20 mg by mouth 2 (two) times daily.     HYDROcodone-acetaminophen (NORCO/VICODIN) 5-325 MG tablet Take 1 tablet by mouth every 4 (four) hours as needed. 10 tablet 0   ibuprofen (ADVIL,MOTRIN) 800 MG tablet Take 800-1,600 mg by mouth 3 (three) times daily as needed for moderate pain.      levalbuterol (XOPENEX) 0.63 MG/3ML nebulizer solution Take 0.63 mg by nebulization every 6 (six) hours as needed for wheezing or shortness of breath.      metoprolol tartrate (LOPRESSOR) 25 MG tablet Take 50 mg by mouth 2 (two) times daily.     mupirocin cream (BACTROBAN) 2 % Apply 1 application topically daily as needed (wound care).     potassium chloride SA (K-DUR,KLOR-CON) 20 MEQ tablet Take 1 tablet by mouth daily as needed. When you take Lasix     PROAIR HFA 108 (90 Base) MCG/ACT inhaler Inhale 1-2 puffs into the lungs every 6 (six) hours as needed for wheezing or shortness of breath.      No current facility-administered medications for this encounter.     Allergies:   Amiodarone   Social History:  The patient  reports that she has been smoking cigarettes. She has been smoking about 0.50 packs per day. She has never used smokeless tobacco. She reports that she does not drink alcohol or use  drugs.   Family History:  The patient's  family history includes Diabetes in her unknown relative; Heart failure in her father; Lung disease in her unknown relative.    ROS:  Please see the history of present illness.   All other systems are personally reviewed and negative.   Exam: Well appearing, alert and conversant, regular work of breathing,  good skin color   Recent Labs: No results found for requested labs within last 8760 hours.  personally reviewed    Other studies personally reviewed: Epic records including care everywhere  The pt has a wearable watch but just shows HR's    ASSESSMENT AND PLAN:  1. H/o afib/flutter with last ablation fall of 2018  No f/u with cardiology  x 18 months Failed to go back for f/u with Dr.  Jacelyn Grip for f/u HOCM Cotinue cardizem 180 mg daily Continue metoprolol at 50 mg bid Will use 30 mg cardizem as needed zio patch will be sent to her to wear x one week, still waiting to arrive Echo to f/u [place] when covid restrictions relax, hopefully within the month Continue asa and pradaxa (on both for h/o left atrium thrombus) This patients CHA2DS2-VASc Score and unadjusted Ischemic Stroke Rate (% per year) is equal to 3.2 % stroke rate/year from a score of 3  Above score calculated as 1 point each if present [CHF, HTN, DM, Vascular=MI/PAD/Aortic Plaque, Age if 65-74, or Female] Above score calculated as 2 points each if present [Age > 75, or Stroke/TIA/TE]  2.Edema Has lost 13 lbs and feels improved Will decrease lasix to 20 mg daily and continue Kt Continue daily weights and report any rapid gain in weight Avoid salt  COVID screen The patient does not have any symptoms that suggest any further testing/ screening at this time.  Social distancing reinforced today.     Follow-up: after results of monitor are known She will ultimately need referral back to cardiology echo/monitor results will determine if at [facility] or locally Current medicines are  reviewed at length with the patient today.     Labs/ tests ordered today include: zio patch/echo No orders of the defined types were placed in this encounter.   Patient Risk:  after full review of this patients clinical status, I feel that they are at  Moderate  risk at this time.   Today, I have spent minutes with the patient with telehealth technology discussing 10 mins  Signed, Roderic Palau NP  10/22/2018 2:41 PM  Afib [facility] [address] [place], [place] [address] [phone]   I hereby voluntarily request, consent and authorize the [facility] and its employed or contracted physicians, physician assistants, nurse practitioners or other licensed health care professionals (the Practitioner), to provide me with telemedicine health care services (the "Services") as deemed necessary by the treating Practitioner. I acknowledge and consent to receive the Services by the Practitioner via telemedicine. I understand that the telemedicine visit will involve communicating with the Practitioner through live audiovisual communication technology and the disclosure of certain medical information by electronic transmission. I acknowledge that I have been given the opportunity to request an in-person assessment or other available alternative prior to the telemedicine visit and am voluntarily participating in the telemedicine visit.   I understand that I have the right to withhold or withdraw my consent to the use of telemedicine in the course of my care at any time, without affecting my right to future care or treatment, and that the Practitioner or I may terminate the telemedicine visit at any time. I understand that I have the right to inspect all information obtained and/or recorded in the course of the telemedicine visit and may receive copies of available information for a reasonable fee.  I understand that some of the potential risks of receiving the Services  via telemedicine include:   Delay or interruption in medical evaluation due to technological equipment failure or disruption;  Information transmitted may not be sufficient (e.g. poor resolution of images) to allow for appropriate medical decision making by the Practitioner; and/or  In rare instances, security protocols could fail, causing a breach of personal health information.   Furthermore, I acknowledge that it is my responsibility  to provide information about my medical history, conditions and care that is complete and accurate to the best of my ability. I acknowledge that Practitioner's advice, recommendations, and/or decision may be based on factors not within their control, such as incomplete or inaccurate data provided by me or distortions of diagnostic images or specimens that may result from electronic transmissions. I understand that the practice of medicine is not an exact science and that Practitioner makes no warranties or guarantees regarding treatment outcomes. I acknowledge that I will receive a copy of this consent concurrently upon execution via email to the email address I last provided but may also request a printed copy by calling the office of the [facility].  I understand that my insurance will be billed for this visit.   I have read or had this consent read to me.  I understand the contents of this consent, which adequately explains the benefits and risks of the Services being provided via telemedicine.  I have been provided ample opportunity to ask questions regarding this consent and the Services and have had my questions answered to my satisfaction.  I give my informed consent for the services to be provided through the use of telemedicine in my medical care  By participating in this telemedicine visit I agree to the above.

## 2018-10-26 ENCOUNTER — Other Ambulatory Visit (HOSPITAL_COMMUNITY): Payer: Self-pay | Admitting: Nurse Practitioner

## 2018-10-29 DIAGNOSIS — G894 Chronic pain syndrome: Secondary | ICD-10-CM | POA: Diagnosis not present

## 2018-11-12 ENCOUNTER — Other Ambulatory Visit (HOSPITAL_COMMUNITY): Payer: Self-pay | Admitting: *Deleted

## 2018-11-12 DIAGNOSIS — I4819 Other persistent atrial fibrillation: Secondary | ICD-10-CM | POA: Diagnosis not present

## 2018-11-28 DIAGNOSIS — G894 Chronic pain syndrome: Secondary | ICD-10-CM | POA: Diagnosis not present

## 2018-11-28 DIAGNOSIS — M1991 Primary osteoarthritis, unspecified site: Secondary | ICD-10-CM | POA: Diagnosis not present

## 2018-11-28 DIAGNOSIS — I10 Essential (primary) hypertension: Secondary | ICD-10-CM | POA: Diagnosis not present

## 2018-11-28 DIAGNOSIS — J441 Chronic obstructive pulmonary disease with (acute) exacerbation: Secondary | ICD-10-CM | POA: Diagnosis not present

## 2018-11-30 ENCOUNTER — Other Ambulatory Visit: Payer: Self-pay

## 2018-11-30 ENCOUNTER — Ambulatory Visit (HOSPITAL_COMMUNITY): Payer: Medicare Other

## 2018-11-30 ENCOUNTER — Ambulatory Visit (HOSPITAL_COMMUNITY)
Admission: RE | Admit: 2018-11-30 | Discharge: 2018-11-30 | Disposition: A | Discharge status: Home / Self Care | Payer: Medicare Other | Source: Ambulatory Visit | Attending: Nurse Practitioner | Admitting: Nurse Practitioner

## 2018-11-30 DIAGNOSIS — I4819 Other persistent atrial fibrillation: Secondary | ICD-10-CM | POA: Insufficient documentation

## 2018-11-30 NOTE — Progress Notes (Signed)
*  PRELIMINARY RESULTS* Echocardiogram 2D Echocardiogram has been performed.  Leavy Cella 11/30/2018, 4:06 PM

## 2018-12-05 ENCOUNTER — Encounter (HOSPITAL_COMMUNITY): Payer: Self-pay | Admitting: *Deleted

## 2018-12-11 ENCOUNTER — Encounter (HOSPITAL_COMMUNITY): Payer: Self-pay | Admitting: *Deleted

## 2018-12-26 DIAGNOSIS — G894 Chronic pain syndrome: Secondary | ICD-10-CM | POA: Diagnosis not present

## 2018-12-26 DIAGNOSIS — R609 Edema, unspecified: Secondary | ICD-10-CM | POA: Diagnosis not present

## 2018-12-26 DIAGNOSIS — M1991 Primary osteoarthritis, unspecified site: Secondary | ICD-10-CM | POA: Diagnosis not present

## 2019-01-22 DIAGNOSIS — M1991 Primary osteoarthritis, unspecified site: Secondary | ICD-10-CM | POA: Diagnosis not present

## 2019-01-22 DIAGNOSIS — J449 Chronic obstructive pulmonary disease, unspecified: Secondary | ICD-10-CM | POA: Diagnosis not present

## 2019-01-22 DIAGNOSIS — G894 Chronic pain syndrome: Secondary | ICD-10-CM | POA: Diagnosis not present

## 2019-02-07 ENCOUNTER — Telehealth: Payer: Self-pay

## 2019-02-08 NOTE — Telephone Encounter (Signed)
Spoke with pt regarding appt on 02/11/19. Pt stated she would like to cancel her appt. Pt was advise to keep virtual appt, but declined. Pt also stated she will call the office to reschedule her appt.

## 2019-02-11 ENCOUNTER — Telehealth: Payer: Medicare Other | Admitting: Internal Medicine

## 2019-02-25 DIAGNOSIS — G894 Chronic pain syndrome: Secondary | ICD-10-CM | POA: Diagnosis not present

## 2019-02-25 DIAGNOSIS — J449 Chronic obstructive pulmonary disease, unspecified: Secondary | ICD-10-CM | POA: Diagnosis not present

## 2019-03-13 DIAGNOSIS — I2510 Atherosclerotic heart disease of native coronary artery without angina pectoris: Secondary | ICD-10-CM | POA: Diagnosis not present

## 2019-03-13 DIAGNOSIS — G894 Chronic pain syndrome: Secondary | ICD-10-CM | POA: Diagnosis not present

## 2019-03-13 DIAGNOSIS — E7849 Other hyperlipidemia: Secondary | ICD-10-CM | POA: Diagnosis not present

## 2019-03-13 DIAGNOSIS — I10 Essential (primary) hypertension: Secondary | ICD-10-CM | POA: Diagnosis not present

## 2019-03-27 DIAGNOSIS — G894 Chronic pain syndrome: Secondary | ICD-10-CM | POA: Diagnosis not present

## 2019-04-13 DIAGNOSIS — E7849 Other hyperlipidemia: Secondary | ICD-10-CM | POA: Diagnosis not present

## 2019-04-13 DIAGNOSIS — I10 Essential (primary) hypertension: Secondary | ICD-10-CM | POA: Diagnosis not present

## 2019-04-13 DIAGNOSIS — I4891 Unspecified atrial fibrillation: Secondary | ICD-10-CM | POA: Diagnosis not present

## 2019-04-13 DIAGNOSIS — I2510 Atherosclerotic heart disease of native coronary artery without angina pectoris: Secondary | ICD-10-CM | POA: Diagnosis not present

## 2019-04-25 DIAGNOSIS — G894 Chronic pain syndrome: Secondary | ICD-10-CM | POA: Diagnosis not present

## 2019-05-06 DIAGNOSIS — J441 Chronic obstructive pulmonary disease with (acute) exacerbation: Secondary | ICD-10-CM | POA: Diagnosis not present

## 2019-05-06 DIAGNOSIS — J449 Chronic obstructive pulmonary disease, unspecified: Secondary | ICD-10-CM | POA: Diagnosis not present

## 2019-05-13 DIAGNOSIS — J449 Chronic obstructive pulmonary disease, unspecified: Secondary | ICD-10-CM | POA: Diagnosis not present

## 2019-05-13 DIAGNOSIS — I10 Essential (primary) hypertension: Secondary | ICD-10-CM | POA: Diagnosis not present

## 2019-05-13 DIAGNOSIS — I4891 Unspecified atrial fibrillation: Secondary | ICD-10-CM | POA: Diagnosis not present

## 2019-05-13 DIAGNOSIS — I2510 Atherosclerotic heart disease of native coronary artery without angina pectoris: Secondary | ICD-10-CM | POA: Diagnosis not present

## 2019-05-23 DIAGNOSIS — G894 Chronic pain syndrome: Secondary | ICD-10-CM | POA: Diagnosis not present

## 2019-05-23 DIAGNOSIS — J22 Unspecified acute lower respiratory infection: Secondary | ICD-10-CM | POA: Diagnosis not present

## 2019-05-23 DIAGNOSIS — J449 Chronic obstructive pulmonary disease, unspecified: Secondary | ICD-10-CM | POA: Diagnosis not present

## 2019-05-23 DIAGNOSIS — J439 Emphysema, unspecified: Secondary | ICD-10-CM | POA: Diagnosis not present

## 2019-06-13 DIAGNOSIS — G894 Chronic pain syndrome: Secondary | ICD-10-CM | POA: Diagnosis not present

## 2019-06-13 DIAGNOSIS — J449 Chronic obstructive pulmonary disease, unspecified: Secondary | ICD-10-CM | POA: Diagnosis not present

## 2019-06-13 DIAGNOSIS — M1991 Primary osteoarthritis, unspecified site: Secondary | ICD-10-CM | POA: Diagnosis not present

## 2019-06-25 DIAGNOSIS — G894 Chronic pain syndrome: Secondary | ICD-10-CM | POA: Diagnosis not present

## 2019-07-01 ENCOUNTER — Ambulatory Visit: Payer: Medicare Other | Attending: Internal Medicine

## 2019-07-01 ENCOUNTER — Other Ambulatory Visit: Payer: Self-pay

## 2019-07-01 DIAGNOSIS — Z20822 Contact with and (suspected) exposure to covid-19: Secondary | ICD-10-CM

## 2019-07-02 LAB — NOVEL CORONAVIRUS, NAA: SARS-CoV-2, NAA: NOT DETECTED

## 2019-07-14 DIAGNOSIS — Z87891 Personal history of nicotine dependence: Secondary | ICD-10-CM | POA: Diagnosis not present

## 2019-07-14 DIAGNOSIS — G894 Chronic pain syndrome: Secondary | ICD-10-CM | POA: Diagnosis not present

## 2019-07-14 DIAGNOSIS — M1991 Primary osteoarthritis, unspecified site: Secondary | ICD-10-CM | POA: Diagnosis not present

## 2019-07-14 DIAGNOSIS — J449 Chronic obstructive pulmonary disease, unspecified: Secondary | ICD-10-CM | POA: Diagnosis not present

## 2019-07-25 DIAGNOSIS — M47816 Spondylosis without myelopathy or radiculopathy, lumbar region: Secondary | ICD-10-CM | POA: Diagnosis not present

## 2019-07-25 DIAGNOSIS — M1991 Primary osteoarthritis, unspecified site: Secondary | ICD-10-CM | POA: Diagnosis not present

## 2019-07-25 DIAGNOSIS — G894 Chronic pain syndrome: Secondary | ICD-10-CM | POA: Diagnosis not present

## 2019-08-11 DIAGNOSIS — M1991 Primary osteoarthritis, unspecified site: Secondary | ICD-10-CM | POA: Diagnosis not present

## 2019-08-11 DIAGNOSIS — G894 Chronic pain syndrome: Secondary | ICD-10-CM | POA: Diagnosis not present

## 2019-08-11 DIAGNOSIS — J449 Chronic obstructive pulmonary disease, unspecified: Secondary | ICD-10-CM | POA: Diagnosis not present

## 2019-08-11 DIAGNOSIS — Z87891 Personal history of nicotine dependence: Secondary | ICD-10-CM | POA: Diagnosis not present

## 2019-08-22 DIAGNOSIS — M47816 Spondylosis without myelopathy or radiculopathy, lumbar region: Secondary | ICD-10-CM | POA: Diagnosis not present

## 2019-08-22 DIAGNOSIS — G894 Chronic pain syndrome: Secondary | ICD-10-CM | POA: Diagnosis not present

## 2019-08-22 DIAGNOSIS — I10 Essential (primary) hypertension: Secondary | ICD-10-CM | POA: Diagnosis not present

## 2019-08-22 DIAGNOSIS — M1991 Primary osteoarthritis, unspecified site: Secondary | ICD-10-CM | POA: Diagnosis not present

## 2019-08-23 ENCOUNTER — Encounter (HOSPITAL_COMMUNITY): Payer: Self-pay | Admitting: Nurse Practitioner

## 2019-08-23 ENCOUNTER — Other Ambulatory Visit: Payer: Self-pay

## 2019-08-23 ENCOUNTER — Ambulatory Visit (HOSPITAL_COMMUNITY)
Admission: RE | Admit: 2019-08-23 | Discharge: 2019-08-23 | Disposition: A | Discharge status: Home / Self Care | Payer: Medicare Other | Source: Ambulatory Visit | Attending: Nurse Practitioner | Admitting: Nurse Practitioner

## 2019-08-23 VITALS — BP 110/70 | HR 88 | Ht 61.0 in | Wt 207.2 lb

## 2019-08-23 DIAGNOSIS — I4892 Unspecified atrial flutter: Secondary | ICD-10-CM | POA: Diagnosis not present

## 2019-08-23 DIAGNOSIS — Z8249 Family history of ischemic heart disease and other diseases of the circulatory system: Secondary | ICD-10-CM | POA: Insufficient documentation

## 2019-08-23 DIAGNOSIS — F17210 Nicotine dependence, cigarettes, uncomplicated: Secondary | ICD-10-CM | POA: Insufficient documentation

## 2019-08-23 DIAGNOSIS — Z7901 Long term (current) use of anticoagulants: Secondary | ICD-10-CM | POA: Insufficient documentation

## 2019-08-23 DIAGNOSIS — D6869 Other thrombophilia: Secondary | ICD-10-CM | POA: Diagnosis not present

## 2019-08-23 DIAGNOSIS — Z7982 Long term (current) use of aspirin: Secondary | ICD-10-CM | POA: Diagnosis not present

## 2019-08-23 DIAGNOSIS — I480 Paroxysmal atrial fibrillation: Secondary | ICD-10-CM | POA: Diagnosis not present

## 2019-08-23 DIAGNOSIS — J449 Chronic obstructive pulmonary disease, unspecified: Secondary | ICD-10-CM | POA: Insufficient documentation

## 2019-08-23 LAB — BASIC METABOLIC PANEL
Anion gap: 8 (ref 5–15)
BUN: 10 mg/dL (ref 6–20)
CO2: 27 mmol/L (ref 22–32)
Calcium: 8.7 mg/dL — ABNORMAL LOW (ref 8.9–10.3)
Chloride: 106 mmol/L (ref 98–111)
Creatinine, Ser: 0.79 mg/dL (ref 0.44–1.00)
GFR calc Af Amer: >60 mL/min (ref >60)
GFR calc non Af Amer: >60 mL/min (ref >60)
Glucose, Bld: 101 mg/dL — ABNORMAL HIGH (ref 70–99)
Potassium: 4.2 mmol/L (ref 3.5–5.1)
Sodium: 141 mmol/L (ref 135–145)

## 2019-08-23 LAB — CBC
HCT: 39.2 % (ref 36.0–46.0)
Hemoglobin: 12.1 g/dL (ref 12.0–15.0)
MCH: 31.1 pg (ref 26.0–34.0)
MCHC: 30.9 g/dL (ref 30.0–36.0)
MCV: 100.8 fL — ABNORMAL HIGH (ref 80.0–100.0)
Platelets: 346 10*3/uL (ref 150–400)
RBC: 3.89 MIL/uL (ref 3.87–5.11)
RDW: 16.3 % — ABNORMAL HIGH (ref 11.5–15.5)
WBC: 10.3 10*3/uL (ref 4.0–10.5)
nRBC: 0 % (ref 0.0–0.2)

## 2019-08-23 NOTE — Patient Instructions (Signed)
Cardioversion scheduled for Friday, March 26th  - Arrive at the [facility] and go to admitting at [facility] not eat or drink anything after midnight the night prior to your procedure.  - Take all your morning medication with a sip of water prior to arrival.  - You will not be able to drive home after your procedure.   Alternate lasix 20mg  and 40mg  every other day until cardioversion then return to 20mg  daily

## 2019-08-23 NOTE — Progress Notes (Signed)
Primary Care Physician: Redmond School, MD Referring Physician: Dr. Dwana Curd, EP [facility] EP [facility]: Dr. Shelda Pal is a 54 y.o. female with a h/o persistent afib, HOCM, that recently had ablation at [facility], in July of  2018.. At f/u visit, at [facility], 03/07/17, she was found to be back in afib. I was notified by Dr. [name] to help assist getting pt set up for cardioversion locally. In the clinic,03/13/18, she was in [place]. She states that she had missed her BB 2 days right before the [facility] visit and thinks that might have been the trigger to go into afib. . Per the last EP [facility] note, if she continued with increased afib burden, repeat ablation was suggested. She is being compliant with pradaxa and continues to smoke.  F/u in afib clinic,04/2017, she has not had any further  afib. However, she told me two episodes of syncope that she has had. The first episode was 2 weeks prior when she was sitting on the couch with her husband in the room with her. She felt lightheaded and husband noted that she went limp. It lasted around 1-2 mins. She was aware of her surroundings when coming around. HR after the episode was 53 bpm, and BP with a systolic around [id number]. She then had another episode last Friday at work. 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 She felt lightheaded and went to get a chair and didn't make it. She woke up on the floor. After a few minutes she was back to her usual self. No further episodes. Did not have afib associated with these episodes. HR today is 53 bpm. On last visit, HR was 47 bpm, and I decreased her BB on that visit. After that did not have any more syncope episodes.  F/u in afib clinic, 08/23/19. She reports that she feels she has been out of rhythm x 6-8 weeks. This correlates with her husband finding  out he had renal failure and starting dialysis. She feels the stress of this contributed. She has noted more LLE since being out of rhythm. She takes  lasix 20 mg daily and occasionally will take extra lasix, 40 mg daily.The edema usually will go down at night. I inquired if she would be willing to go back to [facility] and discuss another ablation and this point she is not interested. In fact, she was to f/u with [facility] after ablation and she has not been back. She continues on pradaxa 150 mg bid for a CHA2DS2VASc score of 2.   Today, she denies symptoms of palpitations, chest pain, shortness of breath, orthopnea, PND, lower extremity edema, dizziness, presyncope, syncope, or neurologic sequela. The patient is tolerating medications without difficulties and is otherwise without complaint today.   Past Medical History:  Diagnosis Date  . Asthma   . Cervical cancer ([place])    a. treated with partial removal of cervix  . COPD (chronic obstructive pulmonary disease) ([place])   . Hypertrophic cardiomyopathy ([place])    a. s/p myomectomy at [facility] 08/2004  . Paroxysmal atrial flutter (H[facility])    a. post-op after myomectomy in 2006. s/p TEE/DCCV.  . Transient atrial fibrillation ([place]) 2006   a. after myomectomy at [facility] in 08/2004 - was on amiodarone but developed elevated LFTs felt possibly secondary to amiodarone    Past Surgical History:  Procedure Laterality Date  . CARDIOVERSION N/A 08/12/2015   Procedure: CARDIOVERSION;  Surgeon: Satira Sark, MD;  Location: [facility]  ENDO SUITE;  Service: Cardiovascular;  Laterality: N/A;  . Septal myomectomy  2006   [facility]  . TEE WITHOUT CARDIOVERSION N/A 03/29/2016   Procedure: TRANSESOPHAGEAL ECHOCARDIOGRAM (TEE);  Surgeon: Lelon Perla, MD;  Location: [facility] ENDOSCOPY;  Service: Cardiovascular;  Laterality: N/A;  . TEE WITHOUT CARDIOVERSION N/A 06/23/2016   Procedure: TRANSESOPHAGEAL ECHOCARDIOGRAM (TEE);  Surgeon: Thayer Headings, MD;  Location: [place];  Service: Cardiovascular;  Laterality: N/A;  . TUBAL LIGATION      Current Outpatient Medications  Medication Sig Dispense Refill  . aspirin EC 81 MG tablet Take 81 mg  by mouth daily.    . dabigatran (PRADAXA) 150 MG CAPS capsule Take 1 capsule (150 mg total) by mouth 2 (two) times daily. 60 capsule 6  . diltiazem (CARDIZEM CD) 180 MG 24 hr capsule Take 1 capsule (180 mg total) by mouth daily. 30 capsule 2  . diltiazem (CARDIZEM) 30 MG tablet TAKE (1) TABLET BY MOUTH EVERY FOUR HOURS AS NEEDED FOR AFIB HEART RATE OVER 100. 45 tablet 3  . furosemide (LASIX) 20 MG tablet Take 20 mg by mouth daily.    Marland Kitchen HYDROcodone-acetaminophen (NORCO/VICODIN) 5-325 MG tablet Take 1 tablet by mouth every 4 (four) hours as needed. 10 tablet 0  . ibuprofen (ADVIL,MOTRIN) 800 MG tablet Take 800-1,600 mg by mouth 3 (three) times daily as needed for moderate pain.     Marland Kitchen levalbuterol (XOPENEX) 0.63 MG/3ML nebulizer solution Take 0.63 mg by nebulization every 6 (six) hours as needed for wheezing or shortness of breath.     . metoprolol tartrate (LOPRESSOR) 25 MG tablet Take 50 mg by mouth 2 (two) times daily.    . potassium chloride SA (K-DUR,KLOR-CON) 20 MEQ tablet Take 1 tablet by mouth daily. When you take Lasix    . PROAIR HFA 108 (90 Base) MCG/ACT inhaler Inhale 1-2 puffs into the lungs every 6 (six) hours as needed for wheezing or shortness of breath.      No current facility-administered medications for this encounter.    Allergies  Allergen Reactions  . Amiodarone Other (See Comments)    Liver function    Social History   Socioeconomic History  . Marital status: Married    Spouse name: Not on file  . Number of children: Not on file  . Years of education: Not on file  . Highest education level: Not on file  Occupational History  . Occupation: Housekeeper  Tobacco Use  . Smoking status: Current Every Day Smoker    Packs/day: 0.50    Types: Cigarettes    Last attempt to quit: 03/28/2016    Years since quitting: 3.4  . Smokeless tobacco: Never Used  . Tobacco comment: since age 63  Substance and Sexual Activity  . Alcohol use: No    Alcohol/week: 0.0 standard  drinks  . Drug use: No  . Sexual activity: Not on file  Other Topics Concern  . Not on file  Social History Narrative  . Not on file   Social Determinants of Health   Financial Resource Strain:   . Difficulty of Paying Living Expenses:   Food Insecurity:   . Worried About Charity fundraiser in the Last Year:   . Arboriculturist in the Last Year:   Transportation Needs:   . Film/video editor (Medical):   Marland Kitchen Lack of Transportation (Non-Medical):   Physical Activity:   . Days of Exercise per Week:   . Minutes of Exercise per Session:  Stress:   . Feeling of Stress :   Social Connections:   . Frequency of Communication with Friends and Family:   . Frequency of Social Gatherings with Friends and Family:   . Attends Religious Services:   . Active Member of Clubs or Organizations:   . Attends Archivist Meetings:   Marland Kitchen Marital Status:   Intimate Partner Violence:   . Fear of Current or Ex-Partner:   . Emotionally Abused:   Marland Kitchen Physically Abused:   . Sexually Abused:     Family History  Problem Relation Age of Onset  . Heart failure Father   . Diabetes Unknown   . Lung disease Unknown     ROS- All systems are reviewed and negative except as per the HPI above  Physical Exam: Vitals:   08/23/19 0957  BP: 110/70  Pulse: 88  Weight: 94 kg  Height: 5\' 1"  (1.549 m)   Wt Readings from Last 3 Encounters:  08/23/19 94 kg  10/17/18 97.5 kg  11/26/17 95.3 kg    Labs: Lab Results  Component Value Date   NA 141 10/05/2016   K 4.7 10/05/2016   CL 101 10/05/2016   CO2 23 10/05/2016   GLUCOSE 79 10/05/2016   BUN 22 10/05/2016   CREATININE 0.88 10/05/2016   CALCIUM 9.1 10/05/2016   MG 1.9 06/16/2016   No results found for: INR No results found for: CHOL, HDL, LDLCALC, TRIG   GEN- The patient is well appearing, alert and oriented x 3 today.   Head- normocephalic, atraumatic Eyes-  Sclera clear, conjunctiva pink Ears- hearing intact Oropharynx-  clear Neck- supple, no JVP Lymph- no cervical lymphadenopathy Lungs- Clear to ausculation bilaterally, normal work of breathing Heart- irregular rate and rhythm, no murmurs, rubs or gallops, PMI not laterally displaced GI- soft, NT, ND, + BS Extremities- no clubbing, cyanosis, or edema MS- no significant deformity or atrophy Skin- no rash or lesion Psych- euthymic mood, full affect Neuro- strength and sensation are intact  EKG- atrial flutter with variable block  at 88 bpm Epic records reviewed Echo- 11/2018-1. The left ventricle has normal systolic function with an ejection  fraction of 60-65%. The cavity size was normal. Left ventricular diastolic  parameters were normal.  2. The right ventricle has normal systolic function. The cavity was  normal. There is no increase in right ventricular wall thickness.  3. Left atrial size was moderately dilated.  4. Right atrial size was mildly dilated.  5. The mitral valve is degenerative. Moderate thickening of the mitral  valve leaflet. Moderate calcification of the mitral valve leaflet. There  is moderate mitral annular calcification present. Mitral valve  regurgitation is moderate by color flow  Doppler.  6. MR is poorly characterized but appears moderate in some apical views.  7. The aortic valve is tricuspid. Moderate thickening of the aortic  valve. Sclerosis without any evidence of stenosis of the aortic valve.   FINDINGS  Left Ventricle: The left ventricle has normal systolic function, with an  ejection fraction of 60-65%. The cavity size was normal. There is no  increase in left ventricular wall thickness. Left ventricular diastolic  parameters were normal.   Right Ventricle: The right ventricle has normal systolic function. The  cavity was normal. There is no increase in right ventricular wall  thickness.    Assessment and Plan: 1. Paroxysmal afib/flutter   Out of rhythm x 6-9 weeks  Will plan for cardioversion   Cbc/bmet today   2  CHA2DS2VASc of 2 Continue on Pradaxa 150 mg bid, no missed doses for at least 3 weeks   3. Syncope No reported syncope since 2018 Resolved with reduction of BB  4. LLE Alternate  lasix 20 mg with 40 mg daily until cardioversion K+ daily    Afib clinic in one week  Butch Penny C. Amela Handley, [facility] [address] [place], [place] [address] [phone]

## 2019-09-03 ENCOUNTER — Other Ambulatory Visit (HOSPITAL_COMMUNITY)
Admission: RE | Admit: 2019-09-03 | Discharge: 2019-09-03 | Disposition: A | Discharge status: Home / Self Care | Payer: Medicare Other | Source: Ambulatory Visit | Attending: Cardiology | Admitting: Cardiology

## 2019-09-03 ENCOUNTER — Other Ambulatory Visit: Payer: Self-pay

## 2019-09-03 DIAGNOSIS — I4892 Unspecified atrial flutter: Secondary | ICD-10-CM | POA: Diagnosis not present

## 2019-09-03 DIAGNOSIS — Z8541 Personal history of malignant neoplasm of cervix uteri: Secondary | ICD-10-CM | POA: Diagnosis not present

## 2019-09-03 DIAGNOSIS — Z7901 Long term (current) use of anticoagulants: Secondary | ICD-10-CM | POA: Diagnosis not present

## 2019-09-03 DIAGNOSIS — Z20822 Contact with and (suspected) exposure to covid-19: Secondary | ICD-10-CM | POA: Diagnosis not present

## 2019-09-03 DIAGNOSIS — Z79899 Other long term (current) drug therapy: Secondary | ICD-10-CM | POA: Diagnosis not present

## 2019-09-03 DIAGNOSIS — I480 Paroxysmal atrial fibrillation: Secondary | ICD-10-CM | POA: Diagnosis not present

## 2019-09-03 DIAGNOSIS — F17210 Nicotine dependence, cigarettes, uncomplicated: Secondary | ICD-10-CM | POA: Diagnosis not present

## 2019-09-03 DIAGNOSIS — Z7982 Long term (current) use of aspirin: Secondary | ICD-10-CM | POA: Diagnosis not present

## 2019-09-03 DIAGNOSIS — I422 Other hypertrophic cardiomyopathy: Secondary | ICD-10-CM | POA: Diagnosis not present

## 2019-09-03 DIAGNOSIS — J449 Chronic obstructive pulmonary disease, unspecified: Secondary | ICD-10-CM | POA: Diagnosis not present

## 2019-09-03 LAB — SARS CORONAVIRUS 2 (TAT 6-24 HRS): SARS Coronavirus 2: NEGATIVE

## 2019-09-06 ENCOUNTER — Encounter (HOSPITAL_COMMUNITY): Payer: Self-pay | Admitting: Certified Registered Nurse Anesthetist

## 2019-09-06 ENCOUNTER — Other Ambulatory Visit: Payer: Self-pay

## 2019-09-06 ENCOUNTER — Encounter (HOSPITAL_COMMUNITY): Payer: Self-pay | Admitting: Cardiology

## 2019-09-06 ENCOUNTER — Ambulatory Visit (HOSPITAL_COMMUNITY)
Admission: RE | Admit: 2019-09-06 | Discharge: 2019-09-06 | Disposition: A | Discharge status: Home / Self Care | Payer: Medicare Other | Attending: Cardiology | Admitting: Cardiology

## 2019-09-06 ENCOUNTER — Encounter (HOSPITAL_COMMUNITY)
Admission: RE | Disposition: A | Discharge status: Home / Self Care | Payer: Self-pay | Source: Home / Self Care | Attending: Cardiology

## 2019-09-06 DIAGNOSIS — J449 Chronic obstructive pulmonary disease, unspecified: Secondary | ICD-10-CM | POA: Insufficient documentation

## 2019-09-06 DIAGNOSIS — Z7901 Long term (current) use of anticoagulants: Secondary | ICD-10-CM | POA: Insufficient documentation

## 2019-09-06 DIAGNOSIS — Z79899 Other long term (current) drug therapy: Secondary | ICD-10-CM | POA: Insufficient documentation

## 2019-09-06 DIAGNOSIS — I4892 Unspecified atrial flutter: Secondary | ICD-10-CM | POA: Diagnosis not present

## 2019-09-06 DIAGNOSIS — Z20822 Contact with and (suspected) exposure to covid-19: Secondary | ICD-10-CM | POA: Insufficient documentation

## 2019-09-06 DIAGNOSIS — I422 Other hypertrophic cardiomyopathy: Secondary | ICD-10-CM | POA: Diagnosis not present

## 2019-09-06 DIAGNOSIS — I480 Paroxysmal atrial fibrillation: Secondary | ICD-10-CM | POA: Insufficient documentation

## 2019-09-06 DIAGNOSIS — Z8541 Personal history of malignant neoplasm of cervix uteri: Secondary | ICD-10-CM | POA: Insufficient documentation

## 2019-09-06 DIAGNOSIS — Z7982 Long term (current) use of aspirin: Secondary | ICD-10-CM | POA: Diagnosis not present

## 2019-09-06 DIAGNOSIS — F17210 Nicotine dependence, cigarettes, uncomplicated: Secondary | ICD-10-CM | POA: Insufficient documentation

## 2019-09-06 SURGERY — CANCELLED PROCEDURE

## 2019-09-06 NOTE — Anesthesia Preprocedure Evaluation (Deleted)
Anesthesia Evaluation  Patient identified by MRN, date of birth, ID band Patient awake    Reviewed: Allergy & Precautions, NPO status , Patient's Chart, lab work & pertinent test results  History of Anesthesia Complications Negative for: history of anesthetic complications  Airway Mallampati: II  TM Distance: >3 FB Neck ROM: Full    Dental no notable dental hx. (+) Edentulous Upper, Dental Advisory Given,    Pulmonary asthma , COPD,  COPD inhaler, Current Smoker, former smoker,    Pulmonary exam normal  + decreased breath sounds      Cardiovascular Exercise Tolerance: Poor hypertension, Pt. on medications + dysrhythmias Atrial Fibrillation  Rhythm:Irregular     Neuro/Psych negative neurological ROS  negative psych ROS   GI/Hepatic negative GI ROS, Neg liver ROS,   Endo/Other    Renal/GU      Musculoskeletal  (+) Arthritis , Osteoarthritis,    Abdominal Normal abdominal exam  (+)   Peds  Hematology negative hematology ROS (+)   Anesthesia Other Findings   Reproductive/Obstetrics                             Anesthesia Physical  Anesthesia Plan  ASA: III  Anesthesia Plan: General   Post-op Pain Management:    Induction: Intravenous  PONV Risk Score and Plan:   Airway Management Planned: Mask and Simple Face Mask  Additional Equipment:   Intra-op Plan:   Post-operative Plan:   Informed Consent: I have reviewed the patients History and Physical, chart, labs and discussed the procedure including the risks, benefits and alternatives for the proposed anesthesia with the patient or authorized representative who has indicated his/her understanding and acceptance.     Dental advisory given  Plan Discussed with: Anesthesiologist and CRNA  Anesthesia Plan Comments:         Anesthesia Quick Evaluation

## 2019-09-06 NOTE — Interval H&P Note (Signed)
History and Physical Interval Note:  09/06/2019 9:47 AM  Kathleen Wright  has presented today for surgery, with the diagnosis of AFIB.  The various methods of treatment have been discussed with the patient and family. After consideration of risks, benefits and other options for treatment, the patient has consented to  Procedure(s): CARDIOVERSION (N/A) as a surgical intervention.  The patient's history has been reviewed, patient examined, no change in status, stable for surgery.  I have reviewed the patient's chart and labs.  Questions were answered to the patient's satisfaction.     Kirk Ruths

## 2019-09-06 NOTE — Progress Notes (Addendum)
ATRIAL FIB OFFICE VISIT   Go to Cards  08/23/2019  [place] [facility]     Photo of [name], NP       [name], NP   Cardiology        Atrial flutter, unspecified type [facility]) +1 more   Dx        Referred by Redmond School, MD   Reason for Visit        Additional Documentation   Vitals:       BP 110/70       Pulse 88       Ht 5\' 1"  (1.549 m)       Wt 94 kg       BMI 39.15 kg/m       BSA 2.01 m    Flowsheets:        NEWS,       MEWS Score,       Anthropometrics,       Method of Visit     Encounter Info:        Billing Info,       History,       Allergies,       Detailed Report            All Notes      Progress Notes by [name], NP at 08/23/2019 10:00 AM   Author: [name], NP Author Type: Nurse Practitioner Filed: 08/23/2019 10:45 AM  Note Status: Signed Cosign: Cosign Not Required Date of Service: 08/23/2019 10:00 AM  Editor: [name], NP (Nurse Practitioner)      Expand AllCollapse All           untitled image     Primary Care Physician: Redmond School, MD  Referring Physician: Dr. Dwana Curd, EP [facility]  EP [facility]: Dr. Shelda Pal is a 54 y.o. female with a h/o persistent afib, HOCM, that recently had ablation at [facility], in July of  2018.. At f/u visit, at [facility], 03/07/17, she was found to be back in afib. I was notified by Dr. [name] to help assist getting pt set up for cardioversion locally. In the clinic,03/13/18, she was in [place]. She states that she had missed her BB 2 days right before the [facility] visit and thinks that might have been the trigger to go into afib. . Per the last EP [facility] note, if she continued with increased afib burden, repeat ablation was suggested. She is being compliant with pradaxa and continues to smoke.     F/u  in afib clinic,04/2017, she has not had any further  afib. However, she told me two episodes of syncope that she has had. The first episode was 2 weeks prior when she was sitting on the couch with her husband in the room with her. She felt lightheaded and husband noted that she went limp. It lasted around 1-2 mins. She was aware of her surroundings when coming around. HR after the episode was 53 bpm, and BP with a systolic around [id number]. She then had another episode last Friday at work. 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 She felt lightheaded and went to get a chair and didn't make it. She woke up on the floor. After a few minutes she was back to her usual self. No further episodes. Did not have afib associated with these episodes. HR today  is 53 bpm. On last visit, HR was 47 bpm, and I decreased her BB on that visit. After that did not have any more syncope episodes.     F/u in afib clinic, 08/23/19. She reports that she feels she has been out of rhythm x 6-8 weeks. This correlates with her husband finding  out he had renal failure and starting dialysis. She feels the stress of this contributed. She has noted more LLE since being out of rhythm. She takes lasix 20 mg daily and occasionally will take extra lasix, 40 mg daily.The edema usually will go down at night. I inquired if she would be willing to go back to [facility] and discuss another ablation and this point she is not interested. In fact, she was to f/u with [facility] after ablation and she has not been back. She continues on pradaxa 150 mg bid for a CHA2DS2VASc score of 2.      Today, she denies symptoms of palpitations, chest pain, shortness of breath, orthopnea, PND, lower extremity edema, dizziness, presyncope, syncope, or neurologic sequela. The patient is tolerating medications without difficulties and is otherwise without complaint today.           Past Medical History:    Diagnosis   Date    .   Asthma        .   Cervical  cancer ([place])            a. treated with partial removal of cervix    .   COPD (chronic obstructive pulmonary disease) ([place])        .   Hypertrophic cardiomyopathy ([place])            a. s/p myomectomy at [facility] 08/2004    .   Paroxysmal atrial flutter ([facility])            a. post-op after myomectomy in 2006. s/p TEE/DCCV.    .   Transient atrial fibrillation ([place])   2006        a. after myomectomy at [facility] in 08/2004 - was on amiodarone but developed elevated LFTs felt possibly secondary to amiodarone              Past Surgical History:    Procedure   Laterality   Date    .   CARDIOVERSION   N/A   08/12/2015        Procedure: CARDIOVERSION;  Surgeon: Satira Sark, MD;  Location[facility];  Service: Cardiovascular;  Laterality: N/A;    .   Septal myomectomy       2006        [facility]    .   TEE WITHOUT CARDIOVERSION   N/A   03/29/2016        Procedure: TRANSESOPHAGEAL ECHOCARDIOGRAM (TEE);  Surgeon: Lelon Perla, MD;  Location: [facility] ENDOSCOPY;  Service: Cardiovascular;  Laterality: N/A;    .   TEE WITHOUT CARDIOVERSION   N/A   06/23/2016        Procedure: TRANSESOPHAGEAL ECHOCARDIOGRAM (TEE);  Surgeon: Thayer Headings, MD;  Location: [place];  Service: Cardiovascular;  Laterality: N/A;    .   TUBAL LIGATION                         Current Outpatient Medications    Medication   Sig   Dispense   Refill    .   aspirin EC 81 MG tablet   Take  81 mg by mouth daily.            .   dabigatran (PRADAXA) 150 MG CAPS capsule   Take 1 capsule (150 mg total) by mouth 2 (two) times daily.   60 capsule   6    .   diltiazem (CARDIZEM CD) 180 MG 24 hr capsule   Take 1 capsule (180 mg total) by mouth daily.   30 capsule   2    .   diltiazem (CARDIZEM) 30 MG tablet   TAKE (1) TABLET BY MOUTH EVERY FOUR HOURS AS NEEDED FOR AFIB HEART RATE OVER 100.   45  tablet   3    .   furosemide (LASIX) 20 MG tablet   Take 20 mg by mouth daily.            Marland Kitchen   HYDROcodone-acetaminophen (NORCO/VICODIN) 5-325 MG tablet   Take 1 tablet by mouth every 4 (four) hours as needed.   10 tablet   0    .   ibuprofen (ADVIL,MOTRIN) 800 MG tablet   Take 800-1,600 mg by mouth 3 (three) times daily as needed for moderate pain.             Marland Kitchen   levalbuterol (XOPENEX) 0.63 MG/3ML nebulizer solution   Take 0.63 mg by nebulization every 6 (six) hours as needed for wheezing or shortness of breath.             .   metoprolol tartrate (LOPRESSOR) 25 MG tablet   Take 50 mg by mouth 2 (two) times daily.            .   potassium chloride SA (K-DUR,KLOR-CON) 20 MEQ tablet   Take 1 tablet by mouth daily. When you take Lasix            .   PROAIR HFA 108 (90 Base) MCG/ACT inhaler   Inhale 1-2 puffs into the lungs every 6 (six) hours as needed for wheezing or shortness of breath.                 No current facility-administered medications for this encounter.                Allergies    Allergen   Reactions    .   Amiodarone   Other (See Comments)            Liver function           Social History             Socioeconomic History    .   Marital status:   Married            Spouse name:   Not on file    .   Number of children:   Not on file    .   Years of education:   Not on file    .   Highest education level:   Not on file    Occupational History    .   Occupation:   Housekeeper    Tobacco Use    .   Smoking status:   Current Every Day Smoker            Packs/day:   0.50            Types:   Cigarettes            Last attempt to quit:   03/28/2016  Years since quitting:   3.4    .   Smokeless tobacco:   Never Used    .   Tobacco comment: since age 2    Substance  and Sexual Activity    .   Alcohol use:   No            Alcohol/week:   0.0 standard drinks    .   Drug use:   No    .   Sexual activity:   Not on file    Other Topics   Concern    .   Not on file    Social History Narrative    .   Not on file        Social Determinants of Health           Financial Resource Strain:     .   Difficulty of Paying Living Expenses:     Food Insecurity:     .   Worried About Charity fundraiser in the Last Year:     .   Arboriculturist in the Last Year:     Transportation Needs:     .   Film/video editor (Medical):     Marland Kitchen   Lack of Transportation (Non-Medical):     Physical Activity:     .   Days of Exercise per Week:     .   Minutes of Exercise per Session:     Stress:     .   Feeling of Stress :     Social Connections:     .   Frequency of Communication with Friends and Family:     .   Frequency of Social Gatherings with Friends and Family:     .   Attends Religious Services:     .   Active Member of Clubs or Organizations:     .   Attends Archivist Meetings:     Marland Kitchen   Marital Status:     Intimate Partner Violence:     .   Fear of Current or Ex-Partner:     .   Emotionally Abused:     Marland Kitchen   Physically Abused:     .   Sexually Abused:                 Family History    Problem   Relation   Age of Onset    .   Heart failure   Father        .   Diabetes   Unknown        .   Lung disease   Unknown              ROS- All systems are reviewed and negative except as per the HPI above     Physical Exam:      Vitals:        08/23/19 0957    BP:   110/70    Pulse:   88    Weight:   94 kg    Height:   5\' 1"  (1.549 m)           Wt Readings from Last 3 Encounters:    08/23/19   94 kg    10/17/18   97.5 kg    11/26/17    95.3 kg          Labs:  Recent Labs  Recent Labs        Recent Labs             GEN- The patient is well appearing, alert and oriented x 3 today.    Head- normocephalic, atraumatic  Eyes-  Sclera clear, conjunctiva pink  Ears- hearing intact  Oropharynx- clear  Neck- supple, no JVP  Lymph- no cervical lymphadenopathy  Lungs- Clear to ausculation bilaterally, normal work of breathing  Heart- irregular rate and rhythm, no murmurs, rubs or gallops, PMI not laterally displaced  GI- soft, NT, ND, + BS  Extremities- no clubbing, cyanosis, or edema  MS- no significant deformity or atrophy  Skin- no rash or lesion  Psych- euthymic mood, full affect  Neuro- strength and sensation are intact     EKG- atrial flutter with variable block  at 88 bpm  Epic records reviewed  Echo-  11/2018-1. The left ventricle has normal systolic function with an ejection  fraction of 60-65%. The cavity size was normal. Left ventricular diastolic  parameters were normal.   2. The right ventricle has normal systolic function. The cavity was  normal. There is no increase in right ventricular wall thickness.   3. Left atrial size was moderately dilated.   4. Right atrial size was mildly dilated.   5. The mitral valve is degenerative. Moderate thickening of the mitral  valve leaflet. Moderate calcification of the mitral valve leaflet. There  is moderate mitral annular calcification present. Mitral valve  regurgitation is moderate by color flow  Doppler.   6. MR is poorly characterized but appears moderate in some apical views.   7. The aortic valve is tricuspid. Moderate thickening of the aortic  valve. Sclerosis without any evidence  of stenosis of the aortic valve.   FINDINGS   Left Ventricle: The left ventricle has normal systolic function, with an  ejection fraction of 60-65%. The cavity size was normal. There is no  increase in left ventricular wall thickness. Left ventricular diastolic  parameters were normal.   Right Ventricle: The right ventricle has normal systolic function. The  cavity was normal. There is no increase in right ventricular wall  thickness.         Assessment and Plan:  1. Paroxysmal afib/flutter    Out of rhythm x 6-9 weeks   Will plan for cardioversion   Cbc/bmet today      2 CHA2DS2VASc of 2  Continue on Pradaxa 150 mg bid, no missed doses for at least 3 weeks      3. Syncope  No reported syncope since 2018  Resolved with reduction of BB     4. LLE  Alternate  lasix 20 mg with 40 mg daily until cardioversion  K+ daily         Afib clinic in one week     Kathleen Wright  [facility]  [address]  [place],  [address]  [phone]       For Media; However pt in sinus on arrival; continue present meds  Kathleen Wright

## 2019-09-06 NOTE — Progress Notes (Signed)
Pt present in NSR. EKG confirmed. Dr. Stanford Breed notified. Orders given to send patient home.

## 2019-09-06 NOTE — H&P (View-Only) (Signed)
ATRIAL FIB OFFICE VISIT   Go to Cards  08/23/2019  [place] [facility]     Photo of [name], NP       [name], NP   Cardiology        Atrial flutter, unspecified type [facility]) +1 more   Dx        Referred by Redmond School, MD   Reason for Visit        Additional Documentation   Vitals:       BP 110/70       Pulse 88       Ht 5\' 1"  (1.549 m)       Wt 94 kg       BMI 39.15 kg/m       BSA 2.01 m    Flowsheets:        NEWS,       MEWS Score,       Anthropometrics,       Method of Visit     Encounter Info:        Billing Info,       History,       Allergies,       Detailed Report            All Notes      Progress Notes by [name], NP at 08/23/2019 10:00 AM   Author: [name], NP Author Type: Nurse Practitioner Filed: 08/23/2019 10:45 AM  Note Status: Signed Cosign: Cosign Not Required Date of Service: 08/23/2019 10:00 AM  Editor: [name], NP (Nurse Practitioner)      Expand AllCollapse All           untitled image     Primary Care Physician: Redmond School, MD  Referring Physician: Dr. Dwana Curd, EP [facility]  EP [facility]: Dr. Shelda Pal is a 54 y.o. female with a h/o persistent afib, HOCM, that recently had ablation at [facility], in July of  2018.. At f/u visit, at [facility], 03/07/17, she was found to be back in afib. I was notified by Dr. [name] to help assist getting pt set up for cardioversion locally. In the clinic,03/13/18, she was in [place]. She states that she had missed her BB 2 days right before the [facility] visit and thinks that might have been the trigger to go into afib. . Per the last EP [facility] note, if she continued with increased afib burden, repeat ablation was suggested. She is being compliant with pradaxa and continues to smoke.     F/u  in afib clinic,04/2017, she has not had any further  afib. However, she told me two episodes of syncope that she has had. The first episode was 2 weeks prior when she was sitting on the couch with her husband in the room with her. She felt lightheaded and husband noted that she went limp. It lasted around 1-2 mins. She was aware of her surroundings when coming around. HR after the episode was 53 bpm, and BP with a systolic around [id number]. She then had another episode last Friday at work. 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 She felt lightheaded and went to get a chair and didn't make it. She woke up on the floor. After a few minutes she was back to her usual self. No further episodes. Did not have afib associated with these episodes. HR today  is 53 bpm. On last visit, HR was 47 bpm, and I decreased her BB on that visit. After that did not have any more syncope episodes.     F/u in afib clinic, 08/23/19. She reports that she feels she has been out of rhythm x 6-8 weeks. This correlates with her husband finding  out he had renal failure and starting dialysis. She feels the stress of this contributed. She has noted more LLE since being out of rhythm. She takes lasix 20 mg daily and occasionally will take extra lasix, 40 mg daily.The edema usually will go down at night. I inquired if she would be willing to go back to [facility] and discuss another ablation and this point she is not interested. In fact, she was to f/u with [facility] after ablation and she has not been back. She continues on pradaxa 150 mg bid for a CHA2DS2VASc score of 2.      Today, she denies symptoms of palpitations, chest pain, shortness of breath, orthopnea, PND, lower extremity edema, dizziness, presyncope, syncope, or neurologic sequela. The patient is tolerating medications without difficulties and is otherwise without complaint today.           Past Medical History:    Diagnosis   Date    .   Asthma        .   Cervical  cancer ([place])            a. treated with partial removal of cervix    .   COPD (chronic obstructive pulmonary disease) ([place])        .   Hypertrophic cardiomyopathy ([place])            a. s/p myomectomy at [facility] 08/2004    .   Paroxysmal atrial flutter (H[facility])            a. post-op after myomectomy in 2006. s/p TEE/DCCV.    .   Transient atrial fibrillation ([place])   2006        a. after myomectomy at [facility] in 08/2004 - was on amiodarone but developed elevated LFTs felt possibly secondary to amiodarone              Past Surgical History:    Procedure   Laterality   Date    .   CARDIOVERSION   N/A   08/12/2015        Procedure: CARDIOVERSION;  Surgeon: Satira Sark, MD;  Location[facility]O [facility];  Service: Cardiovascular;  Laterality: N/A;    .   Septal myomectomy       2006        [facility]    .   TEE WITHOUT CARDIOVERSION   N/A   03/29/2016        Procedure: TRANSESOPHAGEAL ECHOCARDIOGRAM (TEE);  Surgeon: Lelon Perla, MD;  Location: [facility] ENDOSCOPY;  Service: Cardiovascular;  Laterality: N/A;    .   TEE WITHOUT CARDIOVERSION   N/A   06/23/2016        Procedure: TRANSESOPHAGEAL ECHOCARDIOGRAM (TEE);  Surgeon: Thayer Headings, MD;  Location: [place];  Service: Cardiovascular;  Laterality: N/A;    .   TUBAL LIGATION                         Current Outpatient Medications    Medication   Sig   Dispense   Refill    .   aspirin EC 81 MG tablet   Take  81 mg by mouth daily.            .   dabigatran (PRADAXA) 150 MG CAPS capsule   Take 1 capsule (150 mg total) by mouth 2 (two) times daily.   60 capsule   6    .   diltiazem (CARDIZEM CD) 180 MG 24 hr capsule   Take 1 capsule (180 mg total) by mouth daily.   30 capsule   2    .   diltiazem (CARDIZEM) 30 MG tablet   TAKE (1) TABLET BY MOUTH EVERY FOUR HOURS AS NEEDED FOR AFIB HEART RATE OVER 100.   45  tablet   3    .   furosemide (LASIX) 20 MG tablet   Take 20 mg by mouth daily.            Marland Kitchen   HYDROcodone-acetaminophen (NORCO/VICODIN) 5-325 MG tablet   Take 1 tablet by mouth every 4 (four) hours as needed.   10 tablet   0    .   ibuprofen (ADVIL,MOTRIN) 800 MG tablet   Take 800-1,600 mg by mouth 3 (three) times daily as needed for moderate pain.             Marland Kitchen   levalbuterol (XOPENEX) 0.63 MG/3ML nebulizer solution   Take 0.63 mg by nebulization every 6 (six) hours as needed for wheezing or shortness of breath.             .   metoprolol tartrate (LOPRESSOR) 25 MG tablet   Take 50 mg by mouth 2 (two) times daily.            .   potassium chloride SA (K-DUR,KLOR-CON) 20 MEQ tablet   Take 1 tablet by mouth daily. When you take Lasix            .   PROAIR HFA 108 (90 Base) MCG/ACT inhaler   Inhale 1-2 puffs into the lungs every 6 (six) hours as needed for wheezing or shortness of breath.                 No current facility-administered medications for this encounter.                Allergies    Allergen   Reactions    .   Amiodarone   Other (See Comments)            Liver function           Social History             Socioeconomic History    .   Marital status:   Married            Spouse name:   Not on file    .   Number of children:   Not on file    .   Years of education:   Not on file    .   Highest education level:   Not on file    Occupational History    .   Occupation:   Housekeeper    Tobacco Use    .   Smoking status:   Current Every Day Smoker            Packs/day:   0.50            Types:   Cigarettes            Last attempt to quit:   03/28/2016  Years since quitting:   3.4    .   Smokeless tobacco:   Never Used    .   Tobacco comment: since age 32    Substance  and Sexual Activity    .   Alcohol use:   No            Alcohol/week:   0.0 standard drinks    .   Drug use:   No    .   Sexual activity:   Not on file    Other Topics   Concern    .   Not on file    Social History Narrative    .   Not on file        Social Determinants of Health           Financial Resource Strain:     .   Difficulty of Paying Living Expenses:     Food Insecurity:     .   Worried About Charity fundraiser in the Last Year:     .   Arboriculturist in the Last Year:     Transportation Needs:     .   Film/video editor (Medical):     Marland Kitchen   Lack of Transportation (Non-Medical):     Physical Activity:     .   Days of Exercise per Week:     .   Minutes of Exercise per Session:     Stress:     .   Feeling of Stress :     Social Connections:     .   Frequency of Communication with Friends and Family:     .   Frequency of Social Gatherings with Friends and Family:     .   Attends Religious Services:     .   Active Member of Clubs or Organizations:     .   Attends Archivist Meetings:     Marland Kitchen   Marital Status:     Intimate Partner Violence:     .   Fear of Current or Ex-Partner:     .   Emotionally Abused:     Marland Kitchen   Physically Abused:     .   Sexually Abused:                 Family History    Problem   Relation   Age of Onset    .   Heart failure   Father        .   Diabetes   Unknown        .   Lung disease   Unknown              ROS- All systems are reviewed and negative except as per the HPI above     Physical Exam:      Vitals:        08/23/19 0957    BP:   110/70    Pulse:   88    Weight:   94 kg    Height:   5\' 1"  (1.549 m)           Wt Readings from Last 3 Encounters:    08/23/19   94 kg    10/17/18   97.5 kg    11/26/17    95.3 kg          Labs:  Recent Labs  Recent Labs        Recent Labs             GEN- The patient is well appearing, alert and oriented x 3 today.    Head- normocephalic, atraumatic  Eyes-  Sclera clear, conjunctiva pink  Ears- hearing intact  Oropharynx- clear  Neck- supple, no JVP  Lymph- no cervical lymphadenopathy  Lungs- Clear to ausculation bilaterally, normal work of breathing  Heart- irregular rate and rhythm, no murmurs, rubs or gallops, PMI not laterally displaced  GI- soft, NT, ND, + BS  Extremities- no clubbing, cyanosis, or edema  MS- no significant deformity or atrophy  Skin- no rash or lesion  Psych- euthymic mood, full affect  Neuro- strength and sensation are intact     EKG- atrial flutter with variable block  at 88 bpm  Epic records reviewed  Echo-  11/2018-1. The left ventricle has normal systolic function with an ejection  fraction of 60-65%. The cavity size was normal. Left ventricular diastolic  parameters were normal.   2. The right ventricle has normal systolic function. The cavity was  normal. There is no increase in right ventricular wall thickness.   3. Left atrial size was moderately dilated.   4. Right atrial size was mildly dilated.   5. The mitral valve is degenerative. Moderate thickening of the mitral  valve leaflet. Moderate calcification of the mitral valve leaflet. There  is moderate mitral annular calcification present. Mitral valve  regurgitation is moderate by color flow  Doppler.   6. MR is poorly characterized but appears moderate in some apical views.   7. The aortic valve is tricuspid. Moderate thickening of the aortic  valve. Sclerosis without any evidence  of stenosis of the aortic valve.   FINDINGS   Left Ventricle: The left ventricle has normal systolic function, with an  ejection fraction of 60-65%. The cavity size was normal. There is no  increase in left ventricular wall thickness. Left ventricular diastolic  parameters were normal.   Right Ventricle: The right ventricle has normal systolic function. The  cavity was normal. There is no increase in right ventricular wall  thickness.         Assessment and Plan:  1. Paroxysmal afib/flutter    Out of rhythm x 6-9 weeks   Will plan for cardioversion   Cbc/bmet today      2 CHA2DS2VASc of 2  Continue on Pradaxa 150 mg bid, no missed doses for at least 3 weeks      3. Syncope  No reported syncope since 2018  Resolved with reduction of BB     4. LLE  Alternate  lasix 20 mg with 40 mg daily until cardioversion  K+ daily         Afib clinic in one week     Butch Penny C. Mila Homer  Afib [facility]  [address]  [place], [place] [address]  [phone]       For [place]; compliant with pradaxa; no changes. Kirk Ruths

## 2019-09-11 DIAGNOSIS — G894 Chronic pain syndrome: Secondary | ICD-10-CM | POA: Diagnosis not present

## 2019-09-11 DIAGNOSIS — Z87891 Personal history of nicotine dependence: Secondary | ICD-10-CM | POA: Diagnosis not present

## 2019-09-11 DIAGNOSIS — J449 Chronic obstructive pulmonary disease, unspecified: Secondary | ICD-10-CM | POA: Diagnosis not present

## 2019-09-11 DIAGNOSIS — M1991 Primary osteoarthritis, unspecified site: Secondary | ICD-10-CM | POA: Diagnosis not present

## 2019-09-13 ENCOUNTER — Ambulatory Visit (HOSPITAL_COMMUNITY): Payer: Medicare Other | Admitting: Nurse Practitioner

## 2019-09-19 DIAGNOSIS — I10 Essential (primary) hypertension: Secondary | ICD-10-CM | POA: Diagnosis not present

## 2019-09-19 DIAGNOSIS — M47816 Spondylosis without myelopathy or radiculopathy, lumbar region: Secondary | ICD-10-CM | POA: Diagnosis not present

## 2019-09-19 DIAGNOSIS — G894 Chronic pain syndrome: Secondary | ICD-10-CM | POA: Diagnosis not present

## 2019-09-19 DIAGNOSIS — I4891 Unspecified atrial fibrillation: Secondary | ICD-10-CM | POA: Diagnosis not present

## 2019-10-08 ENCOUNTER — Other Ambulatory Visit (HOSPITAL_COMMUNITY): Payer: Self-pay | Admitting: Internal Medicine

## 2019-10-08 DIAGNOSIS — Z1231 Encounter for screening mammogram for malignant neoplasm of breast: Secondary | ICD-10-CM

## 2019-10-11 DIAGNOSIS — M1991 Primary osteoarthritis, unspecified site: Secondary | ICD-10-CM | POA: Diagnosis not present

## 2019-10-11 DIAGNOSIS — G894 Chronic pain syndrome: Secondary | ICD-10-CM | POA: Diagnosis not present

## 2019-10-11 DIAGNOSIS — J449 Chronic obstructive pulmonary disease, unspecified: Secondary | ICD-10-CM | POA: Diagnosis not present

## 2019-10-11 DIAGNOSIS — Z87891 Personal history of nicotine dependence: Secondary | ICD-10-CM | POA: Diagnosis not present

## 2019-10-24 ENCOUNTER — Other Ambulatory Visit: Payer: Self-pay

## 2019-10-24 ENCOUNTER — Ambulatory Visit (HOSPITAL_COMMUNITY)
Admission: RE | Admit: 2019-10-24 | Discharge: 2019-10-24 | Disposition: A | Discharge status: Home / Self Care | Payer: Medicare Other | Source: Ambulatory Visit | Attending: Internal Medicine | Admitting: Internal Medicine

## 2019-10-24 DIAGNOSIS — G894 Chronic pain syndrome: Secondary | ICD-10-CM | POA: Diagnosis not present

## 2019-10-24 DIAGNOSIS — J441 Chronic obstructive pulmonary disease with (acute) exacerbation: Secondary | ICD-10-CM | POA: Diagnosis not present

## 2019-10-24 DIAGNOSIS — Z1231 Encounter for screening mammogram for malignant neoplasm of breast: Secondary | ICD-10-CM | POA: Diagnosis not present

## 2019-10-24 DIAGNOSIS — M47816 Spondylosis without myelopathy or radiculopathy, lumbar region: Secondary | ICD-10-CM | POA: Diagnosis not present

## 2019-10-24 DIAGNOSIS — R609 Edema, unspecified: Secondary | ICD-10-CM | POA: Diagnosis not present

## 2019-11-11 DIAGNOSIS — G894 Chronic pain syndrome: Secondary | ICD-10-CM | POA: Diagnosis not present

## 2019-11-11 DIAGNOSIS — M1991 Primary osteoarthritis, unspecified site: Secondary | ICD-10-CM | POA: Diagnosis not present

## 2019-11-11 DIAGNOSIS — J449 Chronic obstructive pulmonary disease, unspecified: Secondary | ICD-10-CM | POA: Diagnosis not present

## 2019-11-11 DIAGNOSIS — Z87891 Personal history of nicotine dependence: Secondary | ICD-10-CM | POA: Diagnosis not present

## 2019-11-25 DIAGNOSIS — G894 Chronic pain syndrome: Secondary | ICD-10-CM | POA: Diagnosis not present

## 2019-12-11 DIAGNOSIS — M1991 Primary osteoarthritis, unspecified site: Secondary | ICD-10-CM | POA: Diagnosis not present

## 2019-12-11 DIAGNOSIS — G894 Chronic pain syndrome: Secondary | ICD-10-CM | POA: Diagnosis not present

## 2019-12-11 DIAGNOSIS — Z87891 Personal history of nicotine dependence: Secondary | ICD-10-CM | POA: Diagnosis not present

## 2019-12-11 DIAGNOSIS — J449 Chronic obstructive pulmonary disease, unspecified: Secondary | ICD-10-CM | POA: Diagnosis not present

## 2019-12-26 DIAGNOSIS — G894 Chronic pain syndrome: Secondary | ICD-10-CM | POA: Diagnosis not present

## 2019-12-26 DIAGNOSIS — I872 Venous insufficiency (chronic) (peripheral): Secondary | ICD-10-CM | POA: Diagnosis not present

## 2020-01-10 DIAGNOSIS — Z87891 Personal history of nicotine dependence: Secondary | ICD-10-CM | POA: Diagnosis not present

## 2020-01-10 DIAGNOSIS — M1991 Primary osteoarthritis, unspecified site: Secondary | ICD-10-CM | POA: Diagnosis not present

## 2020-01-10 DIAGNOSIS — J449 Chronic obstructive pulmonary disease, unspecified: Secondary | ICD-10-CM | POA: Diagnosis not present

## 2020-01-10 DIAGNOSIS — G894 Chronic pain syndrome: Secondary | ICD-10-CM | POA: Diagnosis not present

## 2020-01-23 DIAGNOSIS — I2510 Atherosclerotic heart disease of native coronary artery without angina pectoris: Secondary | ICD-10-CM | POA: Diagnosis not present

## 2020-01-23 DIAGNOSIS — M47816 Spondylosis without myelopathy or radiculopathy, lumbar region: Secondary | ICD-10-CM | POA: Diagnosis not present

## 2020-01-23 DIAGNOSIS — E782 Mixed hyperlipidemia: Secondary | ICD-10-CM | POA: Diagnosis not present

## 2020-01-23 DIAGNOSIS — Z1389 Encounter for screening for other disorder: Secondary | ICD-10-CM | POA: Diagnosis not present

## 2020-01-23 DIAGNOSIS — J449 Chronic obstructive pulmonary disease, unspecified: Secondary | ICD-10-CM | POA: Diagnosis not present

## 2020-01-23 DIAGNOSIS — I10 Essential (primary) hypertension: Secondary | ICD-10-CM | POA: Diagnosis not present

## 2020-01-23 DIAGNOSIS — M1991 Primary osteoarthritis, unspecified site: Secondary | ICD-10-CM | POA: Diagnosis not present

## 2020-01-23 DIAGNOSIS — I872 Venous insufficiency (chronic) (peripheral): Secondary | ICD-10-CM | POA: Diagnosis not present

## 2020-01-23 DIAGNOSIS — Z0001 Encounter for general adult medical examination with abnormal findings: Secondary | ICD-10-CM | POA: Diagnosis not present

## 2020-02-26 DIAGNOSIS — G894 Chronic pain syndrome: Secondary | ICD-10-CM | POA: Diagnosis not present

## 2020-03-12 DIAGNOSIS — G894 Chronic pain syndrome: Secondary | ICD-10-CM | POA: Diagnosis not present

## 2020-03-12 DIAGNOSIS — J449 Chronic obstructive pulmonary disease, unspecified: Secondary | ICD-10-CM | POA: Diagnosis not present

## 2020-03-12 DIAGNOSIS — Z87891 Personal history of nicotine dependence: Secondary | ICD-10-CM | POA: Diagnosis not present

## 2020-03-26 DIAGNOSIS — G894 Chronic pain syndrome: Secondary | ICD-10-CM | POA: Diagnosis not present

## 2020-04-11 DIAGNOSIS — G894 Chronic pain syndrome: Secondary | ICD-10-CM | POA: Diagnosis not present

## 2020-04-11 DIAGNOSIS — M1991 Primary osteoarthritis, unspecified site: Secondary | ICD-10-CM | POA: Diagnosis not present

## 2020-04-11 DIAGNOSIS — Z87891 Personal history of nicotine dependence: Secondary | ICD-10-CM | POA: Diagnosis not present

## 2020-04-11 DIAGNOSIS — J449 Chronic obstructive pulmonary disease, unspecified: Secondary | ICD-10-CM | POA: Diagnosis not present

## 2020-04-30 DIAGNOSIS — I4891 Unspecified atrial fibrillation: Secondary | ICD-10-CM | POA: Diagnosis not present

## 2020-04-30 DIAGNOSIS — J449 Chronic obstructive pulmonary disease, unspecified: Secondary | ICD-10-CM | POA: Diagnosis not present

## 2020-04-30 DIAGNOSIS — G894 Chronic pain syndrome: Secondary | ICD-10-CM | POA: Diagnosis not present

## 2020-04-30 DIAGNOSIS — M1991 Primary osteoarthritis, unspecified site: Secondary | ICD-10-CM | POA: Diagnosis not present

## 2020-04-30 DIAGNOSIS — M47816 Spondylosis without myelopathy or radiculopathy, lumbar region: Secondary | ICD-10-CM | POA: Diagnosis not present

## 2020-05-12 DIAGNOSIS — J449 Chronic obstructive pulmonary disease, unspecified: Secondary | ICD-10-CM | POA: Diagnosis not present

## 2020-05-12 DIAGNOSIS — Z87891 Personal history of nicotine dependence: Secondary | ICD-10-CM | POA: Diagnosis not present

## 2020-05-12 DIAGNOSIS — G894 Chronic pain syndrome: Secondary | ICD-10-CM | POA: Diagnosis not present

## 2020-05-28 DIAGNOSIS — G894 Chronic pain syndrome: Secondary | ICD-10-CM | POA: Diagnosis not present

## 2020-05-30 IMAGING — MG DIGITAL SCREENING BILAT W/ TOMO W/ CAD
6 of 10 series · 6 of 30 positions shown · non-contrast
Comparison: Previous exam(s).

ACR Breast Density Category a: The breast tissue is almost entirely
fatty.

CLINICAL DATA: Screening.

EXAM:
DIGITAL SCREENING BILATERAL MAMMOGRAM WITH TOMO AND CAD

[L CC synth-2D (1 of 2)]
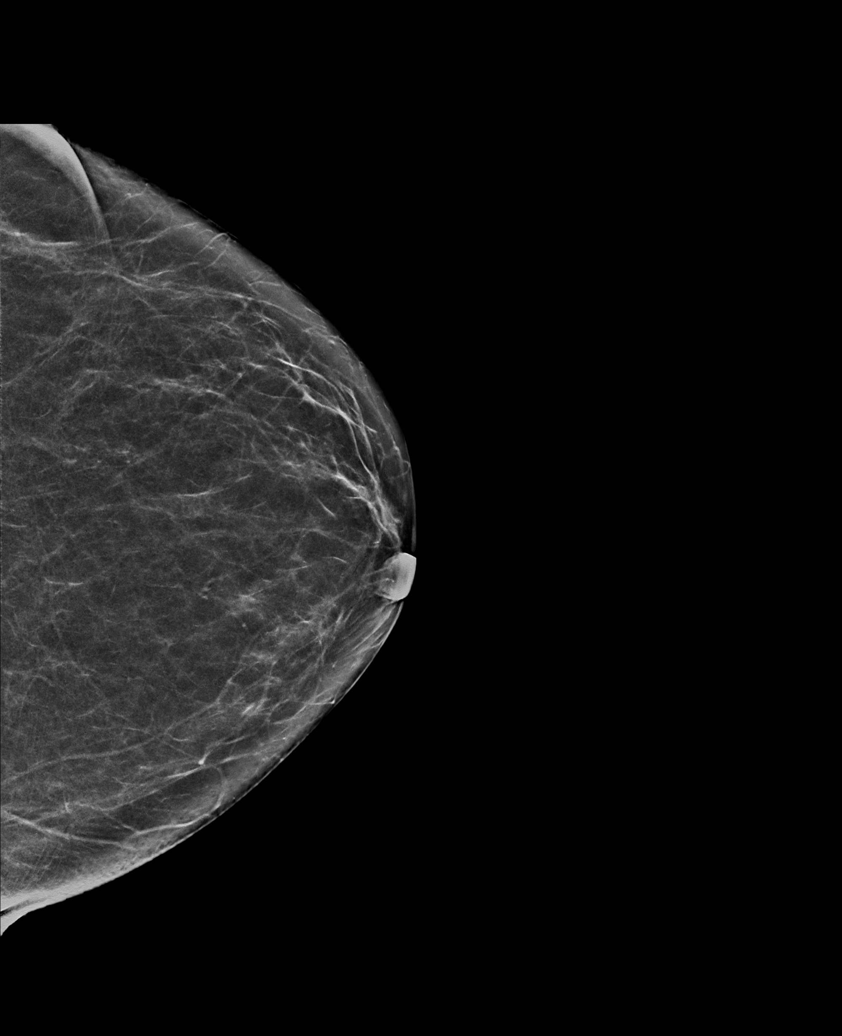

[R MLO synth-2D]
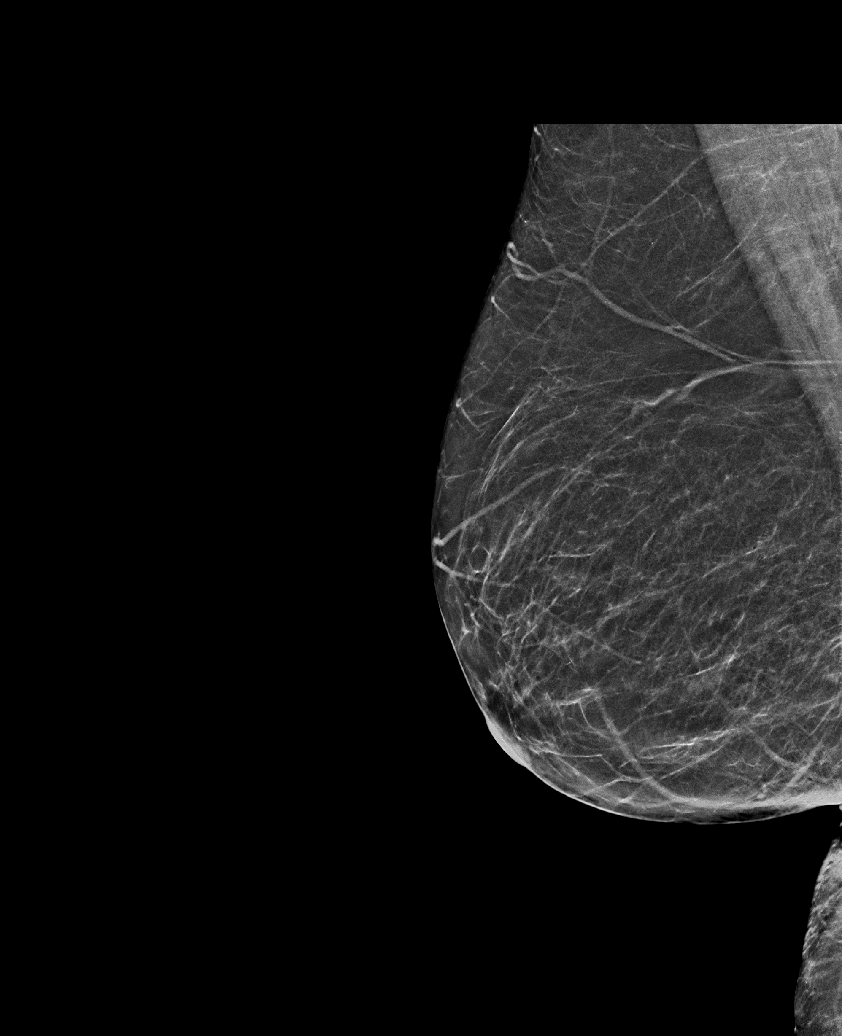

[L MLO synth-2D]
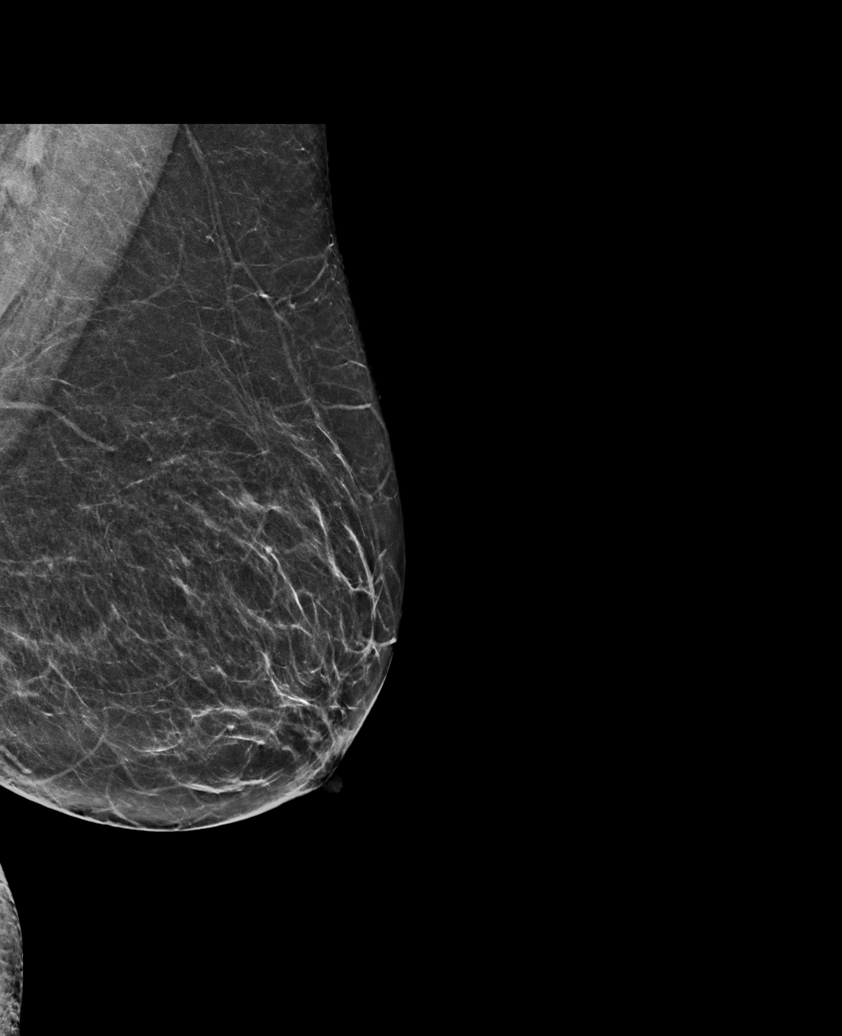

[L CC synth-2D (2 of 2)]
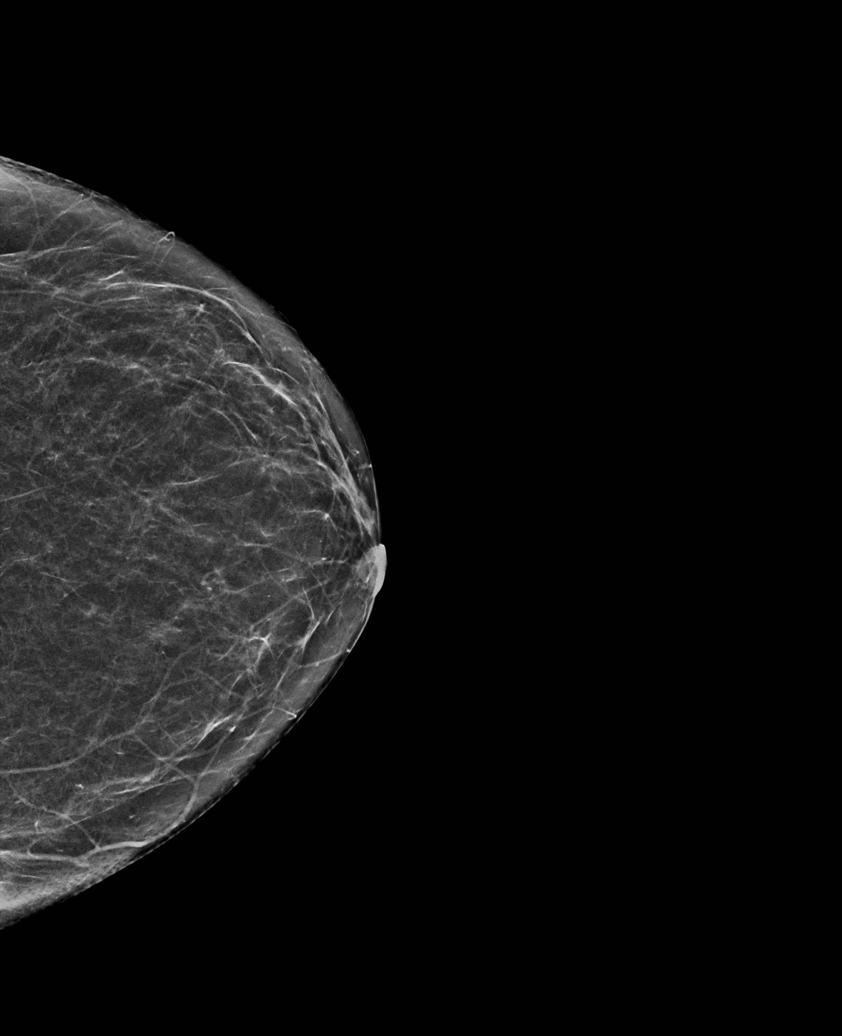

[R CC synth-2D]
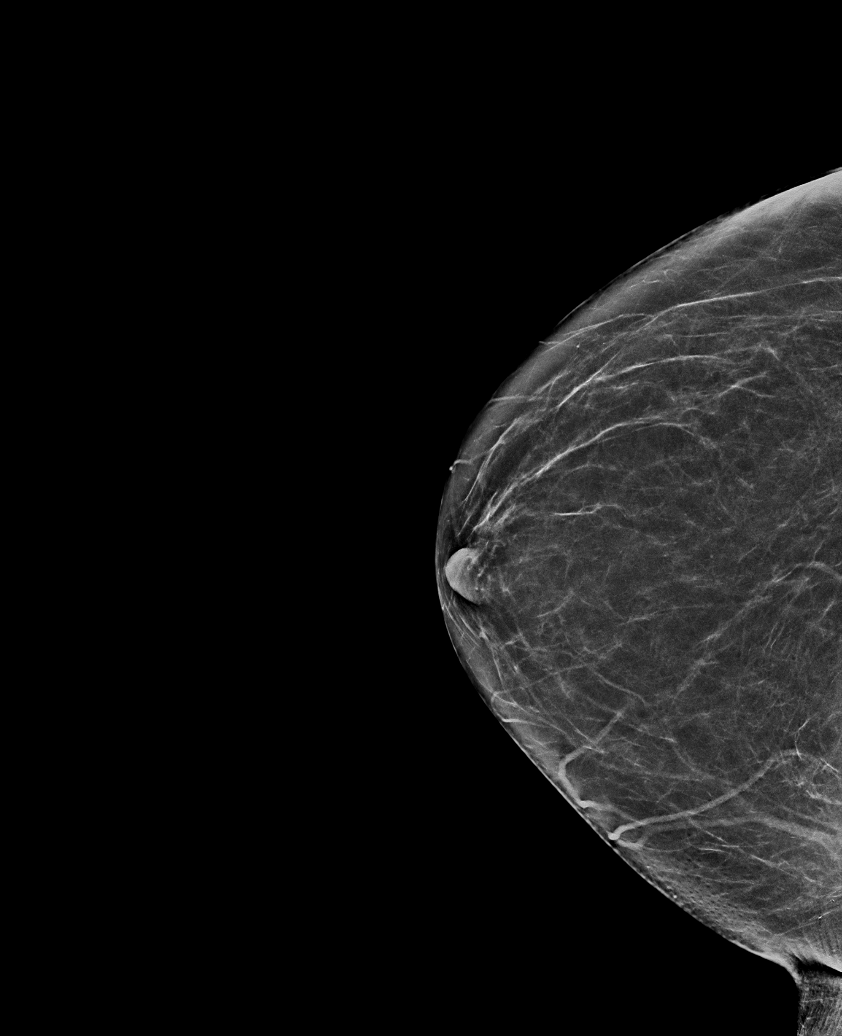

[L CC tomo · tomo slice 31/62.0]
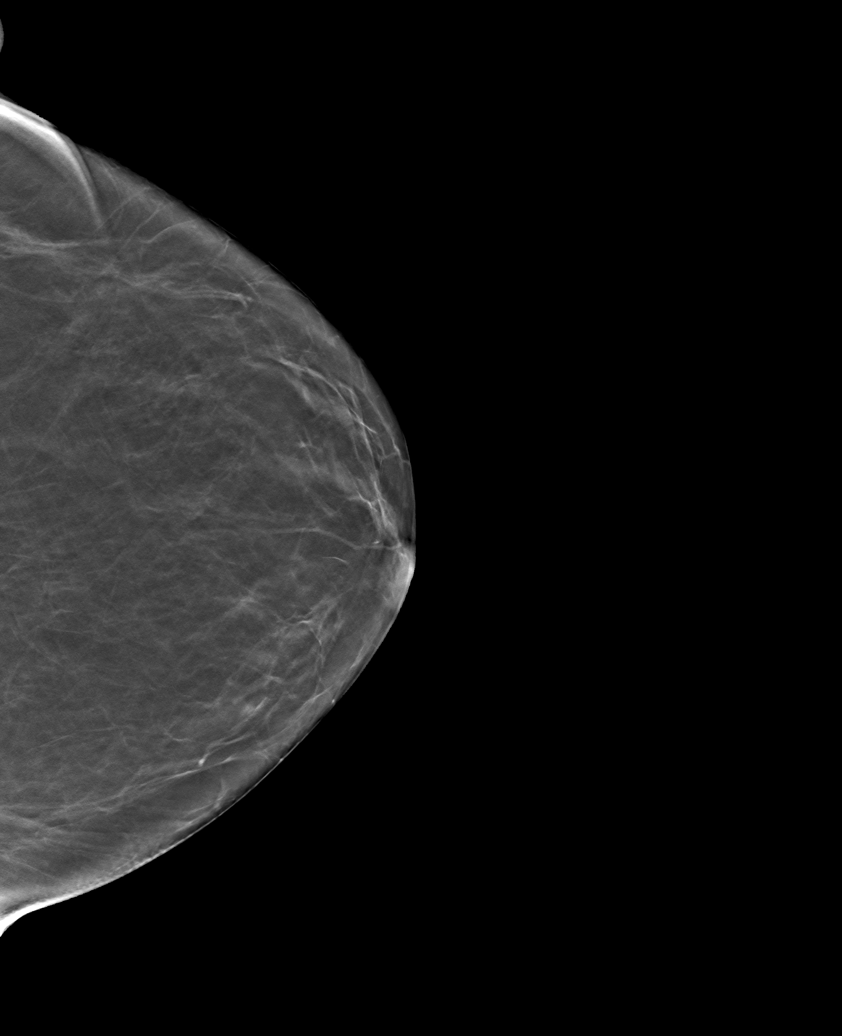

[6 of 30 positions shown; findings below may reference images not displayed]

FINDINGS: There are no findings suspicious for malignancy. Images were
processed with CAD.
IMPRESSION: No mammographic evidence of malignancy. A result letter of this
screening mammogram will be mailed directly to the patient.

RECOMMENDATION:
Screening mammogram in one year. (Code:[id number]-[id number])

BI-RADS CATEGORY  1: Negative.

## 2020-06-12 DIAGNOSIS — J449 Chronic obstructive pulmonary disease, unspecified: Secondary | ICD-10-CM | POA: Diagnosis not present

## 2020-06-12 DIAGNOSIS — Z87891 Personal history of nicotine dependence: Secondary | ICD-10-CM | POA: Diagnosis not present

## 2020-06-12 DIAGNOSIS — G894 Chronic pain syndrome: Secondary | ICD-10-CM | POA: Diagnosis not present

## 2020-06-30 DIAGNOSIS — M1991 Primary osteoarthritis, unspecified site: Secondary | ICD-10-CM | POA: Diagnosis not present

## 2020-06-30 DIAGNOSIS — G894 Chronic pain syndrome: Secondary | ICD-10-CM | POA: Diagnosis not present

## 2020-07-11 DIAGNOSIS — Z87891 Personal history of nicotine dependence: Secondary | ICD-10-CM | POA: Diagnosis not present

## 2020-07-11 DIAGNOSIS — J449 Chronic obstructive pulmonary disease, unspecified: Secondary | ICD-10-CM | POA: Diagnosis not present

## 2020-07-11 DIAGNOSIS — G894 Chronic pain syndrome: Secondary | ICD-10-CM | POA: Diagnosis not present

## 2020-07-11 DIAGNOSIS — M1991 Primary osteoarthritis, unspecified site: Secondary | ICD-10-CM | POA: Diagnosis not present

## 2020-08-04 DIAGNOSIS — M1991 Primary osteoarthritis, unspecified site: Secondary | ICD-10-CM | POA: Diagnosis not present

## 2020-08-04 DIAGNOSIS — M47816 Spondylosis without myelopathy or radiculopathy, lumbar region: Secondary | ICD-10-CM | POA: Diagnosis not present

## 2020-08-17 NOTE — H&P (View-Only) (Signed)
 Primary Care Physician: Fusco, Lawrence, MD Referring Physician: Dr. Koontz, EP [facility] EP [facility]: Dr. Allred   Kathleen Wright is a 54 y.o. female with a h/o persistent afib, HOCM, that recently had ablation at [facility], in July of  2018.. At f/u visit, at [facility], 03/07/17, she was found to be back in afib. I was notified by Dr. Allred to help assist getting pt set up for cardioversion locally. In the clinic,03/13/18, she was in [place]. She states that she had missed her BB 2 days right before the [facility] visit and thinks that might have been the trigger to go into afib. . Per the last EP [facility] note, if she continued with increased afib burden, repeat ablation was suggested. She is being compliant with pradaxa and continues to smoke.  F/u in afib clinic,04/2017, she has not had any further  afib. However, she told me two episodes of syncope that she has had. The first episode was 2 weeks prior when she was sitting on the couch with her husband in the room with her. She felt lightheaded and husband noted that she went limp. It lasted around 1-2 mins. She was aware of her surroundings when coming around. HR after the episode was 53 bpm, and BP with a systolic around 100-110. She then had another episode last Friday at work. 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 She felt lightheaded and went to get a chair and didn't make it. She woke up on the floor. After a few minutes she was back to her usual self. No further episodes. Did not have afib associated with these episodes. HR today is 53 bpm. On last visit, HR was 47 bpm, and I decreased her BB on that visit. After that did not have any more syncope episodes.  F/u in afib clinic, 08/23/19. She reports that she feels she has been out of rhythm x 6-8 weeks. This correlates with her husband finding  out he had renal failure and starting dialysis. She feels the stress of this contributed. She has noted more LLE since being out of rhythm. She takes  lasix 20 mg daily and occasionally will take extra lasix, 40 mg daily.The edema usually will go down at night. I inquired if she would be willing to go back to [facility] and discuss another ablation and this point she is not interested. In fact, she was to f/u with [facility] after ablation and she has not been back. She continues on pradaxa 150 mg bid for a CHA2DS2VASc score of 2.   Follow up in the AF clinic 08/17/20. Patient reports that for the last 2 weeks she has felt she was in afib with increased lower extremity edema and fatigue. Patient reports ~ 10-12 lbs weight gain over that time. She admits she has been under considerable stress with her husband's health (recently had BKA). She denies any missed doses of anticoagulation.   Today, she denies symptoms of palpitations, chest pain, shortness of breath, orthopnea, PND, dizziness, presyncope, syncope, or neurologic sequela. The patient is tolerating medications without difficulties and is otherwise without complaint today.   Past Medical History:  Diagnosis Date  . Asthma   . Cervical cancer (HCC)    a. treated with partial removal of cervix  . COPD (chronic obstructive pulmonary disease) (HCC)   . Hypertrophic cardiomyopathy (H[facility])    a. s/p myomectomy at [facility] 08/2004  . Paroxysmal atrial flutter (HCC)    a. post-op after myomectomy in   2006. s/p TEE/DCCV.  . Transient atrial fibrillation ([place]) 2006   a. after myomectomy at [facility] in 08/2004 - was on amiodarone but developed elevated LFTs felt possibly secondary to amiodarone    Past Surgical History:  Procedure Laterality Date  . CARDIOVERSION N/A 08/12/2015   Procedure: CARDIOVERSION;  Surgeon: Satira Sark, MD;  [facility];  Service: Cardiovascular;  Laterality: N/A;  . Septal myomectomy  2006   [facility]  . TEE WITHOUT CARDIOVERSION N/A 03/29/2016   Procedure: TRANSESOPHAGEAL ECHOCARDIOGRAM (TEE);  Surgeon: Lelon Perla, MD;  Location: [facility] ENDOSCOPY;  Service: Cardiovascular;   Laterality: N/A;  . TEE WITHOUT CARDIOVERSION N/A 06/23/2016   Procedure: TRANSESOPHAGEAL ECHOCARDIOGRAM (TEE);  Surgeon: Thayer Headings, MD;  Location: [place];  Service: Cardiovascular;  Laterality: N/A;  . TUBAL LIGATION      Current Outpatient Medications  Medication Sig Dispense Refill  . albuterol (PROVENTIL) (2.5 MG/3ML) 0.083% nebulizer solution Take 2.5 mg by nebulization every 6 (six) hours as needed for wheezing or shortness of breath.    Marland Kitchen aspirin EC 81 MG tablet Take 81 mg by mouth daily.    . dabigatran (PRADAXA) 150 MG CAPS capsule Take 1 capsule (150 mg total) by mouth 2 (two) times daily. 60 capsule 6  . diltiazem (CARDIZEM CD) 180 MG 24 hr capsule Take 1 capsule (180 mg total) by mouth daily. 30 capsule 2  . diltiazem (CARDIZEM) 30 MG tablet TAKE (1) TABLET BY MOUTH EVERY FOUR HOURS AS NEEDED FOR AFIB HEART RATE OVER 100. 45 tablet 3  . furosemide (LASIX) 20 MG tablet Take 20 mg by mouth daily as needed for edema.     Marland Kitchen HYDROcodone-acetaminophen (NORCO) 10-325 MG tablet Take 1 tablet by mouth every 6 (six) hours as needed for moderate pain.     Marland Kitchen ibuprofen (ADVIL,MOTRIN) 800 MG tablet Take 800-1,600 mg by mouth 3 (three) times daily as needed for moderate pain.     Marland Kitchen ipratropium (ATROVENT) 0.02 % nebulizer solution SMARTSIG:1 Vial(s) Via Nebulizer 1 to 4 Times Daily    . metoprolol tartrate (LOPRESSOR) 25 MG tablet Take 50 mg by mouth 2 (two) times daily.    . potassium chloride (KLOR-CON) 10 MEQ tablet Take 10 mEq by mouth daily as needed (when taking lasix).    Marland Kitchen PROAIR HFA 108 (90 Base) MCG/ACT inhaler Inhale 2 puffs into the lungs every 6 (six) hours as needed for wheezing or shortness of breath.     . triamcinolone (KENALOG) 0.1 % Apply topically.     No current facility-administered medications for this encounter.    Allergies  Allergen Reactions  . Amiodarone Other (See Comments)    Liver function    Social History   Socioeconomic History  . Marital  status: Married    Spouse name: Not on file  . Number of children: Not on file  . Years of education: Not on file  . Highest education level: Not on file  Occupational History  . Occupation: Housekeeper  Tobacco Use  . Smoking status: Current Every Day Smoker    Packs/day: 0.50    Types: Cigarettes    Last attempt to quit: 03/28/2016    Years since quitting: 4.3  . Smokeless tobacco: Never Used  . Tobacco comment: 6-8 cigarettes daily  Vaping Use  . Vaping Use: Never used  Substance and Sexual Activity  . Alcohol use: No    Alcohol/week: 0.0 standard drinks  . Drug use: No  . Sexual activity: Not  on file  Other Topics Concern  . Not on file  Social History Narrative  . Not on file   Social Determinants of Health   Financial Resource Strain: Not on file  Food Insecurity: Not on file  Transportation Needs: Not on file  Physical Activity: Not on file  Stress: Not on file  Social Connections: Not on file  Intimate Partner Violence: Not on file    Family History  Problem Relation Age of Onset  . Heart failure Father   . Diabetes Other   . Lung disease Other     ROS- All systems are reviewed and negative except as per the HPI above  Physical Exam: Vitals:   08/18/20 0838  BP: 124/90  Pulse: 92  Weight: 89.4 kg  Height: 5\' 1"  (1.549 m)   Wt Readings from Last 3 Encounters:  08/18/20 89.4 kg  08/23/19 94 kg  10/17/18 97.5 kg    Labs: Lab Results  Component Value Date   NA 141 08/23/2019   K 4.2 08/23/2019   CL 106 08/23/2019   CO2 27 08/23/2019   GLUCOSE 101 (H) 08/23/2019   BUN 10 08/23/2019   CREATININE 0.79 08/23/2019   CALCIUM 8.7 (L) 08/23/2019   MG 1.9 06/16/2016   No results found for: INR No results found for: CHOL, HDL, LDLCALC, TRIG   GEN- The patient is a well appearing obese female, alert and oriented x 3 today.   HEENT-head normocephalic, atraumatic, sclera clear, conjunctiva pink, hearing intact, trachea midline. Lungs- Clear to  ausculation bilaterally, normal work of breathing Heart- irregular rate and rhythm, no murmurs, rubs or gallops  GI- soft, NT, ND, + BS Extremities- no clubbing, cyanosis. 2+ bilateral lower extremity edema MS- no significant deformity or atrophy Skin- no rash or lesion Psych- euthymic mood, full affect Neuro- strength and sensation are intact   EKG-  Atypical atrial flutter with variable conduction. NO STEMI Vent. rate 92 BPM PR interval * ms QRS duration 106 ms QT/QTc 376/464 ms   Epic records reviewed  Echo- 11/2018 The left ventricle has normal systolic function with an ejection  fraction of 60-65%. The cavity size was normal. Left ventricular diastolic  parameters were normal.  2. The right ventricle has normal systolic function. The cavity was  normal. There is no increase in right ventricular wall thickness.  3. Left atrial size was moderately dilated.  4. Right atrial size was mildly dilated.  5. The mitral valve is degenerative. Moderate thickening of the mitral  valve leaflet. Moderate calcification of the mitral valve leaflet. There  is moderate mitral annular calcification present. Mitral valve  regurgitation is moderate by color flow  Doppler.  6. MR is poorly characterized but appears moderate in some apical views.  7. The aortic valve is tricuspid. Moderate thickening of the aortic  valve. Sclerosis without any evidence of stenosis of the aortic valve.   FINDINGS  Left Ventricle: The left ventricle has normal systolic function, with an  ejection fraction of 60-65%. The cavity size was normal. There is no  increase in left ventricular wall thickness. Left ventricular diastolic  parameters were normal.   Right Ventricle: The right ventricle has normal systolic function. The  cavity was normal. There is no increase in right ventricular wall  thickness.    Assessment and Plan: 1. Persistent afib/flutter   S/p afib ablation at [facility] 2018 Patient in  rate controlled symptomatic atrial flutter. Will plan for DCCV. Check bmet/CBC Continue Lopressor 50 mg BID  Continue diltiazem 180 mg daily Continue Pradaxa 150 mg BID  2 CHA2DS2VASc of 2 Continue on Pradaxa 150 mg BID  3. Syncope No reported syncope since 2018 Resolved with reduction of BB  4. Lower extremity edema Likely related to #1, typical for her in the past when she is out of rhythm. 10-12 lbs weight gain over two weeks. Her lasix 20 mg has been ineffectual.  Will increase to 80 mg x 3 days then decrease back to 20 mg daily. Increase K+ supplement to 20 meq for those 3 days.    Follow up with Roderic Palau post DCCV.    [facility] [address] [place], [place] [address] [phone]

## 2020-08-17 NOTE — Progress Notes (Signed)
Primary Care Physician: Redmond School, MD Referring Physician: Dr. Dwana Curd, EP [facility] EP [facility]: Dr. Shelda Pal is a 55 y.o. female with a h/o persistent afib, HOCM, that recently had ablation at [facility], in July of  2018.. At f/u visit, at [facility], 03/07/17, she was found to be back in afib. I was notified by Dr. [name] to help assist getting pt set up for cardioversion locally. In the clinic,03/13/18, she was in [place]. She states that she had missed her BB 2 days right before the [facility] visit and thinks that might have been the trigger to go into afib. . Per the last EP [facility] note, if she continued with increased afib burden, repeat ablation was suggested. She is being compliant with pradaxa and continues to smoke.  F/u in afib clinic,04/2017, she has not had any further  afib. However, she told me two episodes of syncope that she has had. The first episode was 2 weeks prior when she was sitting on the couch with her husband in the room with her. She felt lightheaded and husband noted that she went limp. It lasted around 1-2 mins. She was aware of her surroundings when coming around. HR after the episode was 53 bpm, and BP with a systolic around 875-643. She then had another episode last Friday at work. 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 She felt lightheaded and went to get a chair and didn't make it. She woke up on the floor. After a few minutes she was back to her usual self. No further episodes. Did not have afib associated with these episodes. HR today is 53 bpm. On last visit, HR was 47 bpm, and I decreased her BB on that visit. After that did not have any more syncope episodes.  F/u in afib clinic, 08/23/19. She reports that she feels she has been out of rhythm x 6-8 weeks. This correlates with her husband finding  out he had renal failure and starting dialysis. She feels the stress of this contributed. She has noted more LLE since being out of rhythm. She takes  lasix 20 mg daily and occasionally will take extra lasix, 40 mg daily.The edema usually will go down at night. I inquired if she would be willing to go back to [facility] and discuss another ablation and this point she is not interested. In fact, she was to f/u with [facility] after ablation and she has not been back. She continues on pradaxa 150 mg bid for a CHA2DS2VASc score of 2.   Follow up in the AF clinic 08/17/20. Patient reports that for the last 2 weeks she has felt she was in afib with increased lower extremity edema and fatigue. Patient reports ~ 10-12 lbs weight gain over that time. She admits she has been under considerable stress with her husband's health (recently had BKA). She denies any missed doses of anticoagulation.   Today, she denies symptoms of palpitations, chest pain, shortness of breath, orthopnea, PND, dizziness, presyncope, syncope, or neurologic sequela. The patient is tolerating medications without difficulties and is otherwise without complaint today.   Past Medical History:  Diagnosis Date  . Asthma   . Cervical cancer ([place])    a. treated with partial removal of cervix  . COPD (chronic obstructive pulmonary disease) ([place])   . Hypertrophic cardiomyopathy ([place])    a. s/p myomectomy at [facility] 08/2004  . Paroxysmal atrial flutter ([facility])    a. post-op after myomectomy in  2006. s/p TEE/DCCV.  . Transient atrial fibrillation ([place]) 2006   a. [facility] - was on amiodarone but developed elevated LFTs felt possibly secondary to amiodarone    Past Surgical History:  Procedure Laterality Date  . CARDIOVERSION N/A 08/12/2015   Procedure: CARDIOVERSION;  Surgeon: Satira Sark, MD;  Location[facility];  Service: Cardiovascular;  Laterality: N/A;  . Septal myomectomy  2006   [facility]  . TEE WITHOUT CARDIOVERSION N/A 03/29/2016   Procedure: TRANSESOPHAGEAL ECHOCARDIOGRAM (TEE);  Surgeon: Lelon Perla, MD;  Location: [facility] ENDOSCOPY;  Service: Cardiovascular;   Laterality: N/A;  . TEE WITHOUT CARDIOVERSION N/A 06/23/2016   Procedure: TRANSESOPHAGEAL ECHOCARDIOGRAM (TEE);  Surgeon: Thayer Headings, MD;  Location: [place];  Service: Cardiovascular;  Laterality: N/A;  . TUBAL LIGATION      Current Outpatient Medications  Medication Sig Dispense Refill  . albuterol (PROVENTIL) (2.5 MG/3ML) 0.083% nebulizer solution Take 2.5 mg by nebulization every 6 (six) hours as needed for wheezing or shortness of breath.    Marland Kitchen aspirin EC 81 MG tablet Take 81 mg by mouth daily.    . dabigatran (PRADAXA) 150 MG CAPS capsule Take 1 capsule (150 mg total) by mouth 2 (two) times daily. 60 capsule 6  . diltiazem (CARDIZEM CD) 180 MG 24 hr capsule Take 1 capsule (180 mg total) by mouth daily. 30 capsule 2  . diltiazem (CARDIZEM) 30 MG tablet TAKE (1) TABLET BY MOUTH EVERY FOUR HOURS AS NEEDED FOR AFIB HEART RATE OVER 100. 45 tablet 3  . furosemide (LASIX) 20 MG tablet Take 20 mg by mouth daily as needed for edema.     Marland Kitchen HYDROcodone-acetaminophen (NORCO) 10-325 MG tablet Take 1 tablet by mouth every 6 (six) hours as needed for moderate pain.     Marland Kitchen ibuprofen (ADVIL,MOTRIN) 800 MG tablet Take 800-1,600 mg by mouth 3 (three) times daily as needed for moderate pain.     Marland Kitchen ipratropium (ATROVENT) 0.02 % nebulizer solution SMARTSIG:1 Vial(s) Via Nebulizer 1 to 4 Times Daily    . metoprolol tartrate (LOPRESSOR) 25 MG tablet Take 50 mg by mouth 2 (two) times daily.    . potassium chloride (KLOR-CON) 10 MEQ tablet Take 10 mEq by mouth daily as needed (when taking lasix).    Marland Kitchen PROAIR HFA 108 (90 Base) MCG/ACT inhaler Inhale 2 puffs into the lungs every 6 (six) hours as needed for wheezing or shortness of breath.     . triamcinolone (KENALOG) 0.1 % Apply topically.     No current facility-administered medications for this encounter.    Allergies  Allergen Reactions  . Amiodarone Other (See Comments)    Liver function    Social History   Socioeconomic History  . Marital  status: Married    Spouse name: Not on file  . Number of children: Not on file  . Years of education: Not on file  . Highest education level: Not on file  Occupational History  . Occupation: Housekeeper  Tobacco Use  . Smoking status: Current Every Day Smoker    Packs/day: 0.50    Types: Cigarettes    Last attempt to quit: 03/28/2016    Years since quitting: 4.3  . Smokeless tobacco: Never Used  . Tobacco comment: 6-8 cigarettes daily  Vaping Use  . Vaping Use: Never used  Substance and Sexual Activity  . Alcohol use: No    Alcohol/week: 0.0 standard drinks  . Drug use: No  . Sexual activity: Not  on file  Other Topics Concern  . Not on file  Social History Narrative  . Not on file   Social Determinants of Health   Financial Resource Strain: Not on file  Food Insecurity: Not on file  Transportation Needs: Not on file  Physical Activity: Not on file  Stress: Not on file  Social Connections: Not on file  Intimate Partner Violence: Not on file    Family History  Problem Relation Age of Onset  . Heart failure Father   . Diabetes Other   . Lung disease Other     ROS- All systems are reviewed and negative except as per the HPI above  Physical Exam: Vitals:   08/18/20 0838  BP: 124/90  Pulse: 92  Weight: 89.4 kg  Height: 5\' 1"  (1.549 m)   Wt Readings from Last 3 Encounters:  08/18/20 89.4 kg  08/23/19 94 kg  10/17/18 97.5 kg    Labs: Lab Results  Component Value Date   NA 141 08/23/2019   K 4.2 08/23/2019   CL 106 08/23/2019   CO2 27 08/23/2019   GLUCOSE 101 (H) 08/23/2019   BUN 10 08/23/2019   CREATININE 0.79 08/23/2019   CALCIUM 8.7 (L) 08/23/2019   MG 1.9 06/16/2016   No results found for: INR No results found for: CHOL, HDL, LDLCALC, TRIG   GEN- The patient is a well appearing obese female, alert and oriented x 3 today.   HEENT-head normocephalic, atraumatic, sclera clear, conjunctiva pink, hearing intact, trachea midline. Lungs- Clear to  ausculation bilaterally, normal work of breathing Heart- irregular rate and rhythm, no murmurs, rubs or gallops  GI- soft, NT, ND, + BS Extremities- no clubbing, cyanosis. 2+ bilateral lower extremity edema MS- no significant deformity or atrophy Skin- no rash or lesion Psych- euthymic mood, full affect Neuro- strength and sensation are intact   EKG-  Atypical atrial flutter with variable conduction. NO STEMI Vent. rate 92 BPM PR interval * ms QRS duration 106 ms QT/QTc 376/464 ms   Epic records reviewed  Echo- 11/2018 The left ventricle has normal systolic function with an ejection  fraction of 60-65%. The cavity size was normal. Left ventricular diastolic  parameters were normal.  2. The right ventricle has normal systolic function. The cavity was  normal. There is no increase in right ventricular wall thickness.  3. Left atrial size was moderately dilated.  4. Right atrial size was mildly dilated.  5. The mitral valve is degenerative. Moderate thickening of the mitral  valve leaflet. Moderate calcification of the mitral valve leaflet. There  is moderate mitral annular calcification present. Mitral valve  regurgitation is moderate by color flow  Doppler.  6. MR is poorly characterized but appears moderate in some apical views.  7. The aortic valve is tricuspid. Moderate thickening of the aortic  valve. Sclerosis without any evidence of stenosis of the aortic valve.   FINDINGS  Left Ventricle: The left ventricle has normal systolic function, with an  ejection fraction of 60-65%. The cavity size was normal. There is no  increase in left ventricular wall thickness. Left ventricular diastolic  parameters were normal.   Right Ventricle: The right ventricle has normal systolic function. The  cavity was normal. There is no increase in right ventricular wall  thickness.    Assessment and Plan: 1. Persistent afib/flutter   S/p afib ablation at [facility] 2018 Patient in  rate controlled symptomatic atrial flutter. Will plan for DCCV. Check bmet/CBC Continue Lopressor 50 mg BID  Continue diltiazem 180 mg daily Continue Pradaxa 150 mg BID  2 CHA2DS2VASc of 2 Continue on Pradaxa 150 mg BID  3. Syncope No reported syncope since 2018 Resolved with reduction of BB  4. Lower extremity edema Likely related to #1, typical for her in the past when she is out of rhythm. 10-12 lbs weight gain over two weeks. Her lasix 20 mg has been ineffectual.  Will increase to 80 mg x 3 days then decrease back to 20 mg daily. Increase K+ supplement to 20 meq for those 3 days.    Follow up with Roderic Palau post DCCV.    [facility] [address] [place], [place] [address] [phone]

## 2020-08-18 ENCOUNTER — Encounter (HOSPITAL_COMMUNITY): Payer: Self-pay | Admitting: Physician Assistant

## 2020-08-18 ENCOUNTER — Other Ambulatory Visit: Payer: Self-pay

## 2020-08-18 ENCOUNTER — Ambulatory Visit (HOSPITAL_COMMUNITY)
Admission: RE | Admit: 2020-08-18 | Discharge: 2020-08-18 | Disposition: A | Discharge status: Home / Self Care | Payer: Medicare Other | Source: Ambulatory Visit | Attending: Physician Assistant | Admitting: Physician Assistant

## 2020-08-18 VITALS — BP 124/90 | HR 92 | Ht 61.0 in | Wt 197.0 lb

## 2020-08-18 DIAGNOSIS — R600 Localized edema: Secondary | ICD-10-CM | POA: Diagnosis not present

## 2020-08-18 DIAGNOSIS — F17210 Nicotine dependence, cigarettes, uncomplicated: Secondary | ICD-10-CM | POA: Insufficient documentation

## 2020-08-18 DIAGNOSIS — I484 Atypical atrial flutter: Secondary | ICD-10-CM | POA: Diagnosis not present

## 2020-08-18 DIAGNOSIS — I4819 Other persistent atrial fibrillation: Secondary | ICD-10-CM | POA: Diagnosis not present

## 2020-08-18 DIAGNOSIS — Z7982 Long term (current) use of aspirin: Secondary | ICD-10-CM | POA: Diagnosis not present

## 2020-08-18 DIAGNOSIS — I422 Other hypertrophic cardiomyopathy: Secondary | ICD-10-CM | POA: Insufficient documentation

## 2020-08-18 DIAGNOSIS — I421 Obstructive hypertrophic cardiomyopathy: Secondary | ICD-10-CM | POA: Diagnosis not present

## 2020-08-18 DIAGNOSIS — Z7901 Long term (current) use of anticoagulants: Secondary | ICD-10-CM | POA: Diagnosis not present

## 2020-08-18 DIAGNOSIS — Z79899 Other long term (current) drug therapy: Secondary | ICD-10-CM | POA: Diagnosis not present

## 2020-08-18 DIAGNOSIS — R55 Syncope and collapse: Secondary | ICD-10-CM | POA: Insufficient documentation

## 2020-08-18 DIAGNOSIS — Z888 Allergy status to other drugs, medicaments and biological substances status: Secondary | ICD-10-CM | POA: Diagnosis not present

## 2020-08-18 LAB — CBC
HCT: 39.4 % (ref 36.0–46.0)
Hemoglobin: 12.2 g/dL (ref 12.0–15.0)
MCH: 31.4 pg (ref 26.0–34.0)
MCHC: 31 g/dL (ref 30.0–36.0)
MCV: 101.3 fL — ABNORMAL HIGH (ref 80.0–100.0)
Platelets: 343 10*3/uL (ref 150–400)
RBC: 3.89 MIL/uL (ref 3.87–5.11)
RDW: 16.4 % — ABNORMAL HIGH (ref 11.5–15.5)
WBC: 9.8 10*3/uL (ref 4.0–10.5)
nRBC: 0 % (ref 0.0–0.2)

## 2020-08-18 LAB — BASIC METABOLIC PANEL
Anion gap: 8 (ref 5–15)
BUN: 9 mg/dL (ref 6–20)
CO2: 30 mmol/L (ref 22–32)
Calcium: 9 mg/dL (ref 8.9–10.3)
Chloride: 100 mmol/L (ref 98–111)
Creatinine, Ser: 0.74 mg/dL (ref 0.44–1.00)
GFR, Estimated: >60 mL/min (ref >60)
Glucose, Bld: 109 mg/dL — ABNORMAL HIGH (ref 70–99)
Potassium: 4.8 mmol/L (ref 3.5–5.1)
Sodium: 138 mmol/L (ref 135–145)

## 2020-08-18 NOTE — Patient Instructions (Signed)
Cardioversion scheduled for Monday, March 14th  - Arrive at the [facility] and go to admitting at 830AM  - Do not eat or drink anything after midnight the night prior to your procedure.  - Take all your morning medication (except diabetic medications) with a sip of water prior to arrival.  - You will not be able to drive home after your procedure.  - Do NOT miss any doses of your blood thinner - if you should miss a dose please notify our office immediately.  - If you feel as if you go back into normal rhythm prior to scheduled cardioversion, please notify our office immediately. If your procedure is canceled in the cardioversion suite you will be charged a cancellation fee.   Increase lasix to 80mg  a day for the next 3 days then reduce back to 20mg  once a day  Increase potassium to twice a day for the next 3 days then back to normal dosing

## 2020-08-20 DIAGNOSIS — J449 Chronic obstructive pulmonary disease, unspecified: Secondary | ICD-10-CM | POA: Diagnosis not present

## 2020-08-22 ENCOUNTER — Other Ambulatory Visit (HOSPITAL_COMMUNITY)
Admission: RE | Admit: 2020-08-22 | Discharge: 2020-08-22 | Disposition: A | Discharge status: Home / Self Care | Payer: Medicare Other | Source: Ambulatory Visit | Attending: Cardiology | Admitting: Cardiology

## 2020-08-22 DIAGNOSIS — Z20822 Contact with and (suspected) exposure to covid-19: Secondary | ICD-10-CM | POA: Insufficient documentation

## 2020-08-22 DIAGNOSIS — Z01812 Encounter for preprocedural laboratory examination: Secondary | ICD-10-CM | POA: Diagnosis not present

## 2020-08-22 LAB — SARS CORONAVIRUS 2 (TAT 6-24 HRS): SARS Coronavirus 2: NEGATIVE

## 2020-08-24 ENCOUNTER — Encounter (HOSPITAL_COMMUNITY)
Admission: RE | Disposition: A | Discharge status: Home / Self Care | Payer: Self-pay | Source: Ambulatory Visit | Attending: Cardiology

## 2020-08-24 ENCOUNTER — Ambulatory Visit (HOSPITAL_COMMUNITY): Payer: Medicare Other | Admitting: Anesthesiology

## 2020-08-24 ENCOUNTER — Ambulatory Visit (HOSPITAL_COMMUNITY)
Admission: RE | Admit: 2020-08-24 | Discharge: 2020-08-24 | Disposition: A | Discharge status: Home / Self Care | Payer: Medicare Other | Source: Ambulatory Visit | Attending: Cardiology | Admitting: Cardiology

## 2020-08-24 ENCOUNTER — Other Ambulatory Visit: Payer: Self-pay

## 2020-08-24 ENCOUNTER — Encounter (HOSPITAL_COMMUNITY): Payer: Self-pay | Admitting: Cardiology

## 2020-08-24 DIAGNOSIS — I421 Obstructive hypertrophic cardiomyopathy: Secondary | ICD-10-CM | POA: Diagnosis not present

## 2020-08-24 DIAGNOSIS — I4819 Other persistent atrial fibrillation: Secondary | ICD-10-CM | POA: Diagnosis not present

## 2020-08-24 DIAGNOSIS — Z79899 Other long term (current) drug therapy: Secondary | ICD-10-CM | POA: Diagnosis not present

## 2020-08-24 DIAGNOSIS — Z7982 Long term (current) use of aspirin: Secondary | ICD-10-CM | POA: Diagnosis not present

## 2020-08-24 DIAGNOSIS — F17210 Nicotine dependence, cigarettes, uncomplicated: Secondary | ICD-10-CM | POA: Diagnosis not present

## 2020-08-24 DIAGNOSIS — Z888 Allergy status to other drugs, medicaments and biological substances status: Secondary | ICD-10-CM | POA: Diagnosis not present

## 2020-08-24 DIAGNOSIS — I484 Atypical atrial flutter: Secondary | ICD-10-CM | POA: Insufficient documentation

## 2020-08-24 DIAGNOSIS — J441 Chronic obstructive pulmonary disease with (acute) exacerbation: Secondary | ICD-10-CM | POA: Diagnosis not present

## 2020-08-24 DIAGNOSIS — I4892 Unspecified atrial flutter: Secondary | ICD-10-CM | POA: Diagnosis present

## 2020-08-24 DIAGNOSIS — I480 Paroxysmal atrial fibrillation: Secondary | ICD-10-CM | POA: Diagnosis not present

## 2020-08-24 HISTORY — PX: CARDIOVERSION: SHX1299

## 2020-08-24 SURGERY — CARDIOVERSION
Anesthesia: General

## 2020-08-24 MED ORDER — ALBUTEROL SULFATE (2.5 MG/3ML) 0.083% IN NEBU
2.5000 mg | INHALATION_SOLUTION | RESPIRATORY_TRACT | Status: DC | PRN
  Administered 2020-08-24: 2.5 mg via RESPIRATORY_TRACT

## 2020-08-24 MED ORDER — ALBUTEROL SULFATE (2.5 MG/3ML) 0.083% IN NEBU
INHALATION_SOLUTION | RESPIRATORY_TRACT | Status: AC
  Filled 2020-08-24: qty 3

## 2020-08-24 MED ORDER — EPHEDRINE SULFATE-NACL 50-0.9 MG/10ML-% IV SOSY
PREFILLED_SYRINGE | INTRAVENOUS | Status: DC | PRN
  Administered 2020-08-24: 15 mg via INTRAVENOUS

## 2020-08-24 MED ORDER — SODIUM CHLORIDE 0.9 % IV SOLN: INTRAVENOUS | Status: DC

## 2020-08-24 MED ORDER — LIDOCAINE HCL (CARDIAC) PF 100 MG/5ML IV SOSY
PREFILLED_SYRINGE | INTRAVENOUS | Status: DC | PRN
  Administered 2020-08-24: 100 mg via INTRATRACHEAL

## 2020-08-24 MED ORDER — PROPOFOL 10 MG/ML IV BOLUS
INTRAVENOUS | Status: DC | PRN
  Administered 2020-08-24: 70 mg via INTRAVENOUS

## 2020-08-24 MED ORDER — ATROPINE SULFATE 0.4 MG/ML IJ SOLN
INTRAMUSCULAR | Status: DC | PRN
  Administered 2020-08-24: .4 mg via INTRAVENOUS

## 2020-08-24 NOTE — Interval H&P Note (Signed)
History and Physical Interval Note:  08/24/2020 2:39 PM  Kathleen Wright  has presented today for surgery, with the diagnosis of A-FIB.  The various methods of treatment have been discussed with the patient and family. After consideration of risks, benefits and other options for treatment, the patient has consented to  Procedure(s): CARDIOVERSION (N/A) as a surgical intervention.  The patient's history has been reviewed, patient examined, no change in status, stable for surgery.  I have reviewed the patient's chart and labs.  Questions were answered to the patient's satisfaction.     [employer]

## 2020-08-24 NOTE — Progress Notes (Signed)
1000cc nss given per anesthesia. Ok to d/c home

## 2020-08-24 NOTE — CV Procedure (Signed)
    Electrical Cardioversion Procedure Note CATHRYN GALLERY [id number] [birth year]  Procedure: Electrical Cardioversion Indications:  Atrial Flutter  Time Out: Verified patient identification, verified procedure,medications/allergies/relevent history reviewed, required imaging and test results available.  Performed  Procedure Details  The patient was NPO after midnight. Anesthesia was administered at the beside - propofol.  Cardioversion was performed with synchronized biphasic defibrillation via AP pads with 200 joules.  1 attempt(s) were performed.  The patient converted to normal sinus rhythm. The patient tolerated the procedure well   IMPRESSION:  Successful cardioversion of atrial fibrillation. Post conversion pause, bradycardia. Atropine and ephedrine administered. Duoneb as well, wheeze.   -Stopped Diltiazem cd 180. Continue metoprolol 50 BID.   Candee Furbish 08/24/2020, 9:45 AM

## 2020-08-24 NOTE — Transfer of Care (Signed)
Immediate Anesthesia Transfer of Care Note  Patient: Kathleen Wright  Procedure(s) Performed: CARDIOVERSION (N/A )  Patient Location: Endoscopy Unit  Anesthesia Type:General  Level of Consciousness: sedated  Airway & Oxygen Therapy: Patient connected to nasal cannula oxygen  Post-op Assessment: Report given to RN  Post vital signs: stable  Last Vitals:  Vitals Value Taken Time  BP    Temp    Pulse    Resp    SpO2      Last Pain:  Vitals:   08/24/20 0851  TempSrc: Temporal  PainSc: 5          Complications: No complications documented.

## 2020-08-24 NOTE — Anesthesia Preprocedure Evaluation (Addendum)
Anesthesia Evaluation  Patient identified by MRN, date of birth, ID band Patient awake    Reviewed: Allergy & Precautions, NPO status , Patient's Chart, lab work & pertinent test results  History of Anesthesia Complications Negative for: history of anesthetic complications  Airway Mallampati: II  TM Distance: >3 FB Neck ROM: Full    Dental  (+) Edentulous Upper, Missing,    Pulmonary asthma , COPD, Current Smoker,    Pulmonary exam normal        Cardiovascular Normal cardiovascular exam+ dysrhythmias (on Pradaxa) Atrial Fibrillation   HOCM s/p myomectomy 2006  TTE 2020: EF 60-65%, moderate LAE, mild RAE, moderate MR   Neuro/Psych negative neurological ROS  negative psych ROS   GI/Hepatic negative GI ROS, Neg liver ROS,   Endo/Other  negative endocrine ROS  Renal/GU negative Renal ROS  negative genitourinary   Musculoskeletal  (+) Arthritis ,   Abdominal   Peds  Hematology negative hematology ROS (+)   Anesthesia Other Findings Day of surgery medications reviewed with patient.  Reproductive/Obstetrics negative OB ROS                            Anesthesia Physical Anesthesia Plan  ASA: III  Anesthesia Plan: General   Post-op Pain Management:    Induction: Intravenous  PONV Risk Score and Plan: Treatment may vary due to age or medical condition and Propofol infusion  Airway Management Planned: Mask  Additional Equipment: None  Intra-op Plan:   Post-operative Plan:   Informed Consent: I have reviewed the patients History and Physical, chart, labs and discussed the procedure including the risks, benefits and alternatives for the proposed anesthesia with the patient or authorized representative who has indicated his/her understanding and acceptance.       Plan Discussed with: CRNA  Anesthesia Plan Comments:        Anesthesia Quick Evaluation

## 2020-08-24 NOTE — Anesthesia Postprocedure Evaluation (Signed)
Anesthesia Post Note  Patient: Kathleen Wright  Procedure(s) Performed: CARDIOVERSION (N/A )     Patient location during evaluation: PACU Anesthesia Type: General Level of consciousness: awake and alert and oriented Pain management: pain level controlled Vital Signs Assessment: post-procedure vital signs reviewed and stable Respiratory status: spontaneous breathing, nonlabored ventilation and respiratory function stable Cardiovascular status: blood pressure returned to baseline Postop Assessment: no apparent nausea or vomiting Anesthetic complications: no   No complications documented.  Last Vitals:  Vitals:   08/24/20 0955 08/24/20 1005  BP: (!) 79/49 90/62  Pulse: (!) 44 (!) 44  Resp: 18 17  Temp:    SpO2: 100% 100%    Last Pain:  Vitals:   08/24/20 1005  TempSrc:   PainSc: 0-No pain                 Brennan Bailey

## 2020-08-24 NOTE — Discharge Instructions (Signed)
Electrical Cardioversion Electrical cardioversion is the delivery of a jolt of electricity to restore a normal rhythm to the heart. A rhythm that is too fast or is not regular keeps the heart from pumping well. In this procedure, sticky patches or metal paddles are placed on the chest to deliver electricity to the heart from a device. This procedure may be done in an emergency if:  There is low or no blood pressure as a result of the heart rhythm.  Normal rhythm must be restored as fast as possible to protect the brain and heart from further damage.  It may save a life. This may also be a scheduled procedure for irregular or fast heart rhythms that are not immediately life-threatening. Tell a health care provider about:  Any allergies you have.  All medicines you are taking, including vitamins, herbs, eye drops, creams, and over-the-counter medicines.  Any problems you or family members have had with anesthetic medicines.  Any blood disorders you have.  Any surgeries you have had.  Any medical conditions you have.  Whether you are pregnant or may be pregnant. What are the risks? Generally, this is a safe procedure. However, problems may occur, including:  Allergic reactions to medicines.  A blood clot that breaks free and travels to other parts of your body.  The possible return of an abnormal heart rhythm within hours or days after the procedure.  Your heart stopping (cardiac arrest). This is rare. What happens before the procedure? Medicines  Your health care provider may have you start taking: ? Blood-thinning medicines (anticoagulants) so your blood does not clot as easily. ? Medicines to help stabilize your heart rate and rhythm.  Ask your health care provider about: ? Changing or stopping your regular medicines. This is especially important if you are taking diabetes medicines or blood thinners. ? Taking medicines such as aspirin and ibuprofen. These medicines can  thin your blood. Do not take these medicines unless your health care provider tells you to take them. ? Taking over-the-counter medicines, vitamins, herbs, and supplements. General instructions  Follow instructions from your health care provider about eating or drinking restrictions.  Plan to have someone take you home from the hospital or clinic.  If you will be going home right after the procedure, plan to have someone with you for 24 hours.  Ask your health care provider what steps will be taken to help prevent infection. These may include washing your skin with a germ-killing soap. What happens during the procedure?  An IV will be inserted into one of your veins.  Sticky patches (electrodes) or metal paddles may be placed on your chest.  You will be given a medicine to help you relax (sedative).  An electrical shock will be delivered. The procedure may vary among health care providers and hospitals.   What can I expect after the procedure?  Your blood pressure, heart rate, breathing rate, and blood oxygen level will be monitored until you leave the hospital or clinic.  Your heart rhythm will be watched to make sure it does not change.  You may have some redness on the skin where the shocks were given. Follow these instructions at home:  Do not drive for 24 hours if you were given a sedative during your procedure.  Take over-the-counter and prescription medicines only as told by your health care provider.  Ask your health care provider how to check your pulse. Check it often.  Rest for 48 hours after the procedure   or as told by your health care provider.  Avoid or limit your caffeine use as told by your health care provider.  Keep all follow-up visits as told by your health care provider. This is important. Contact a health care provider if:  You feel like your heart is beating too quickly or your pulse is not regular.  You have a serious muscle cramp that does not go  away. Get help right away if:  You have discomfort in your chest.  You are dizzy or you feel faint.  You have trouble breathing or you are short of breath.  Your speech is slurred.  You have trouble moving an arm or leg on one side of your body.  Your fingers or toes turn cold or blue. Summary  Electrical cardioversion is the delivery of a jolt of electricity to restore a normal rhythm to the heart.  This procedure may be done right away in an emergency or may be a scheduled procedure if the condition is not an emergency.  Generally, this is a safe procedure.  After the procedure, check your pulse often as told by your health care provider. This information is not intended to replace advice given to you by your health care provider. Make sure you discuss any questions you have with your health care provider. Document Revised: 12/31/2018 Document Reviewed: 12/31/2018 Elsevier Patient Education  2021 [employer]  

## 2020-08-25 ENCOUNTER — Encounter (HOSPITAL_COMMUNITY): Payer: Self-pay | Admitting: Cardiology

## 2020-08-31 DIAGNOSIS — M47816 Spondylosis without myelopathy or radiculopathy, lumbar region: Secondary | ICD-10-CM | POA: Diagnosis not present

## 2020-08-31 DIAGNOSIS — G894 Chronic pain syndrome: Secondary | ICD-10-CM | POA: Diagnosis not present

## 2020-08-31 DIAGNOSIS — M1991 Primary osteoarthritis, unspecified site: Secondary | ICD-10-CM | POA: Diagnosis not present

## 2020-09-02 ENCOUNTER — Ambulatory Visit (HOSPITAL_COMMUNITY): Payer: Medicare Other | Admitting: Nurse Practitioner

## 2020-09-04 ENCOUNTER — Encounter (HOSPITAL_COMMUNITY): Payer: Self-pay | Admitting: Nurse Practitioner

## 2020-09-04 ENCOUNTER — Ambulatory Visit (HOSPITAL_COMMUNITY)
Admission: RE | Admit: 2020-09-04 | Discharge: 2020-09-04 | Disposition: A | Discharge status: Home / Self Care | Payer: Medicare Other | Source: Ambulatory Visit | Attending: Nurse Practitioner | Admitting: Nurse Practitioner

## 2020-09-04 ENCOUNTER — Other Ambulatory Visit: Payer: Self-pay

## 2020-09-04 VITALS — BP 118/82 | HR 61 | Ht 61.0 in | Wt 194.8 lb

## 2020-09-04 DIAGNOSIS — R55 Syncope and collapse: Secondary | ICD-10-CM | POA: Diagnosis not present

## 2020-09-04 DIAGNOSIS — D6869 Other thrombophilia: Secondary | ICD-10-CM

## 2020-09-04 DIAGNOSIS — J449 Chronic obstructive pulmonary disease, unspecified: Secondary | ICD-10-CM | POA: Insufficient documentation

## 2020-09-04 DIAGNOSIS — F17210 Nicotine dependence, cigarettes, uncomplicated: Secondary | ICD-10-CM | POA: Insufficient documentation

## 2020-09-04 DIAGNOSIS — Z79899 Other long term (current) drug therapy: Secondary | ICD-10-CM | POA: Diagnosis not present

## 2020-09-04 DIAGNOSIS — Z7982 Long term (current) use of aspirin: Secondary | ICD-10-CM | POA: Insufficient documentation

## 2020-09-04 DIAGNOSIS — I4819 Other persistent atrial fibrillation: Secondary | ICD-10-CM | POA: Diagnosis not present

## 2020-09-04 DIAGNOSIS — I517 Cardiomegaly: Secondary | ICD-10-CM | POA: Insufficient documentation

## 2020-09-04 DIAGNOSIS — R600 Localized edema: Secondary | ICD-10-CM | POA: Diagnosis not present

## 2020-09-04 DIAGNOSIS — I4892 Unspecified atrial flutter: Secondary | ICD-10-CM | POA: Diagnosis not present

## 2020-09-04 NOTE — Progress Notes (Signed)
Primary Care Physician: Redmond School, MD Referring Physician: Dr. Dwana Curd, EP [facility] EP [facility]: Dr. Shelda Pal is a 55 y.o. female with a h/o persistent afib, HOCM, that recently had ablation at [facility], in July of  2018.. At f/u visit, at [facility], 03/07/17, she was found to be back in afib. I was notified by Dr. [name] to help assist getting pt set up for cardioversion locally. In the clinic,03/13/18, she was in [place]. She states that she had missed her BB 2 days right before the [facility] visit and thinks that might have been the trigger to go into afib. . Per the last EP [facility] note, if she continued with increased afib burden, repeat ablation was suggested. She is being compliant with pradaxa and continues to smoke.  F/u in afib clinic,04/2017, she has not had any further  afib. However, she told me two episodes of syncope that she has had. The first episode was 2 weeks prior when she was sitting on the couch with her husband in the room with her. She felt lightheaded and husband noted that she went limp. It lasted around 1-2 mins. She was aware of her surroundings when coming around. HR after the episode was 53 bpm, and BP with a systolic around 151-761. She then had another episode last Friday at work. 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 She felt lightheaded and went to get a chair and didn't make it. She woke up on the floor. After a few minutes she was back to her usual self. No further episodes. Did not have afib associated with these episodes. HR today is 53 bpm. On last visit, HR was 47 bpm, and I decreased her BB on that visit. After that did not have any more syncope episodes.  F/u in afib clinic, 08/23/19. She reports that she feels she has been out of rhythm x 6-8 weeks. This correlates with her husband finding  out he had renal failure and starting dialysis. She feels the stress of this contributed. She has noted more LLE since being out of rhythm. She takes  lasix 20 mg daily and occasionally will take extra lasix, 40 mg daily.The edema usually will go down at night. I inquired if she would be willing to go back to [facility] and discuss another ablation and this point she is not interested. In fact, she was to f/u with [facility] after ablation and she has not been back. She continues on pradaxa 150 mg bid for a CHA2DS2VASc score of 2.   Follow up in the AF clinic 08/17/20. Patient reports that for the last 2 weeks she has felt she was in afib with increased lower extremity edema and fatigue. Patient reports ~ 10-12 lbs weight gain over that time. She admits she has been under considerable stress with her husband's health (recently had BKA). She denies any missed doses of anticoagulation.   F/u in afib clinic, 09/04/20. She had a successful cardioversion 3/26 and remains in SR. She has chronic swelling of her LE's. Even with the higher lasix prior to CV, her LE edema  did not change that much. She is taking 20 mg daily.We did discuss that she may to see a Vein/vascular MD to make sure she does not have varicosities that is contributing.  Today, she denies symptoms of palpitations, chest pain, shortness of breath, orthopnea, PND, dizziness, presyncope, syncope, or neurologic sequela. The patient is tolerating medications without difficulties and is otherwise  without complaint today.   Past Medical History:  Diagnosis Date  . Asthma   . Cervical cancer ([place])    a. treated with partial removal of cervix  . COPD (chronic obstructive pulmonary disease) ([place])   . Hypertrophic cardiomyopathy ([place])    a. s/p myomectomy at [facility] 08/2004  . Paroxysmal atrial flutter ([facility])    a. post-op after myomectomy in 2006. s/p TEE/DCCV.  . Transient atrial fibrillation ([place]) 2006   a. after myomectomy at [facility] in 08/2004 - was on amiodarone but developed elevated LFTs felt possibly secondary to amiodarone    Past Surgical History:  Procedure Laterality Date  . CARDIOVERSION N/A 08/12/2015    Procedure: CARDIOVERSION;  Surgeon: Satira Sark, MD;  Location[facility] ENDO [facility];  Service: Cardiovascular;  Laterality: N/A;  . CARDIOVERSION N/A 08/24/2020   Procedure: CARDIOVERSION;  Surgeon: Jerline Pain, MD;  Location: [facility] ENDOSCOPY;  Service: Cardiovascular;  Laterality: N/A;  . Septal myomectomy  2006   [facility]  . TEE WITHOUT CARDIOVERSION N/A 03/29/2016   Procedure: TRANSESOPHAGEAL ECHOCARDIOGRAM (TEE);  Surgeon: Lelon Perla, MD;  Location: [facility] ENDOSCOPY;  Service: Cardiovascular;  Laterality: N/A;  . TEE WITHOUT CARDIOVERSION N/A 06/23/2016   Procedure: TRANSESOPHAGEAL ECHOCARDIOGRAM (TEE);  Surgeon: Thayer Headings, MD;  Location: [place];  Service: Cardiovascular;  Laterality: N/A;  . TUBAL LIGATION      Current Outpatient Medications  Medication Sig Dispense Refill  . albuterol (PROVENTIL) (2.5 MG/3ML) 0.083% nebulizer solution Take 2.5 mg by nebulization every 6 (six) hours as needed for wheezing or shortness of breath.    Marland Kitchen aspirin EC 81 MG tablet Take 81 mg by mouth daily.    . dabigatran (PRADAXA) 150 MG CAPS capsule Take 1 capsule (150 mg total) by mouth 2 (two) times daily. 60 capsule 6  . diltiazem (CARDIZEM) 30 MG tablet TAKE (1) TABLET BY MOUTH EVERY FOUR HOURS AS NEEDED FOR AFIB HEART RATE OVER 100. 45 tablet 3  . furosemide (LASIX) 20 MG tablet Take 20 mg by mouth daily as needed for edema.     Marland Kitchen HYDROcodone-acetaminophen (NORCO) 10-325 MG tablet Take 1 tablet by mouth every 6 (six) hours as needed for moderate pain.     Marland Kitchen ibuprofen (ADVIL,MOTRIN) 800 MG tablet Take 800-1,600 mg by mouth 3 (three) times daily as needed for moderate pain.     Marland Kitchen ipratropium (ATROVENT) 0.02 % nebulizer solution Take 0.25 mg by nebulization every 4 (four) hours as needed for wheezing or shortness of breath.    . metoprolol tartrate (LOPRESSOR) 50 MG tablet Take 50 mg by mouth 2 (two) times daily.    . potassium chloride (KLOR-CON) 10 MEQ tablet Take 10 mEq by mouth daily as  needed (when taking lasix).    Marland Kitchen PROAIR HFA 108 (90 Base) MCG/ACT inhaler Inhale 2 puffs into the lungs every 6 (six) hours as needed for wheezing or shortness of breath.     . triamcinolone (KENALOG) 0.1 % Apply 1 application topically daily as needed (Blister).     No current facility-administered medications for this encounter.    Allergies  Allergen Reactions  . Amiodarone Other (See Comments)    Liver function    Social History   Socioeconomic History  . Marital status: Married    Spouse name: Not on file  . Number of children: Not on file  . Years of education: Not on file  . Highest education level: Not on file  Occupational History  . Occupation: Secretary/administrator  Tobacco Use  . Smoking status: Current Every Day Smoker    Packs/day: 0.50    Types: Cigarettes  . Smokeless tobacco: Never Used  . Tobacco comment: 6-8 cigarettes daily  Vaping Use  . Vaping Use: Never used  Substance and Sexual Activity  . Alcohol use: No    Alcohol/week: 0.0 standard drinks  . Drug use: No  . Sexual activity: Not on file  Other Topics Concern  . Not on file  Social History Narrative  . Not on file   Social Determinants of Health   Financial Resource Strain: Not on file  Food Insecurity: Not on file  Transportation Needs: Not on file  Physical Activity: Not on file  Stress: Not on file  Social Connections: Not on file  Intimate Partner Violence: Not on file    Family History  Problem Relation Age of Onset  . Heart failure Father   . Diabetes Other   . Lung disease Other     ROS- All systems are reviewed and negative except as per the HPI above  Physical Exam: Vitals:   09/04/20 1337  BP: 118/82  Pulse: 61  Weight: 88.4 kg  Height: 5\' 1"  (1.549 m)   Wt Readings from Last 3 Encounters:  09/04/20 88.4 kg  08/24/20 88.5 kg  08/18/20 89.4 kg    Labs: Lab Results  Component Value Date   NA 138 08/18/2020   K 4.8 08/18/2020   CL 100 08/18/2020   CO2 30  08/18/2020   GLUCOSE 109 (H) 08/18/2020   BUN 9 08/18/2020   CREATININE 0.74 08/18/2020   CALCIUM 9.0 08/18/2020   MG 1.9 06/16/2016   No results found for: INR No results found for: CHOL, HDL, LDLCALC, TRIG   GEN- The patient is a well appearing obese female, alert and oriented x 3 today.   HEENT-head normocephalic, atraumatic, sclera clear, conjunctiva pink, hearing intact, trachea midline. Lungs- Clear to ausculation bilaterally, normal work of breathing Heart- irregular rate and rhythm, no murmurs, rubs or gallops  GI- soft, NT, ND, + BS Extremities- no clubbing, cyanosis. 2+ bilateral lower extremity edema MS- no significant deformity or atrophy Skin- no rash or lesion Psych- euthymic mood, full affect Neuro- strength and sensation are intact   EKG-   NSR at 71 bpm, pr int 156 ms, qrs int 74 ms, qtc 417 m2   Epic records reviewed  Echo- 11/2018 The left ventricle has normal systolic function with an ejection  fraction of 60-65%. The cavity size was normal. Left ventricular diastolic  parameters were normal.  2. The right ventricle has normal systolic function. The cavity was  normal. There is no increase in right ventricular wall thickness.  3. Left atrial size was moderately dilated.  4. Right atrial size was mildly dilated.  5. The mitral valve is degenerative. Moderate thickening of the mitral  valve leaflet. Moderate calcification of the mitral valve leaflet. There  is moderate mitral annular calcification present. Mitral valve  regurgitation is moderate by color flow  Doppler.  6. MR is poorly characterized but appears moderate in some apical views.  7. The aortic valve is tricuspid. Moderate thickening of the aortic  valve. Sclerosis without any evidence of stenosis of the aortic valve.   FINDINGS  Left Ventricle: The left ventricle has normal systolic function, with an  ejection fraction of 60-65%. The cavity size was normal. There is no  increase in  left ventricular wall thickness. Left ventricular diastolic  parameters were normal.  Right Ventricle: The right ventricle has normal systolic function. The  cavity was normal. There is no increase in right ventricular wall  thickness.    Assessment and Plan: 1. Persistent afib/flutter   S/p afib ablation at [facility] 2018 Patient was in  rate controlled symptomatic atrial flutter, successful CV,  3/14, and remains in SR Continue Lopressor 50 mg BID  2 CHA2DS2VASc of 2 Continue on Pradaxa 150 mg BID  3. Syncope No reported syncope since 2018 Resolved with reduction of BB  4. Lower extremity edema Chronic, recent increase in diuretic really did not reduce size of LE's  Suggested seeing a vein specialist to see if Varicosities may be contributing Elevate legs, avoid salt, knee high compression stockings  Continue 20 mg lasix daily May increase to 40 mg daily if significant swelling over baseline, but then lower back to 20 mg daily  Will see as needed going forward    Butch Penny C. Carroll, [facility] [address] [place], [place] [address] [phone]

## 2020-09-09 DIAGNOSIS — Z87891 Personal history of nicotine dependence: Secondary | ICD-10-CM | POA: Diagnosis not present

## 2020-09-09 DIAGNOSIS — J449 Chronic obstructive pulmonary disease, unspecified: Secondary | ICD-10-CM | POA: Diagnosis not present

## 2020-09-20 DIAGNOSIS — J449 Chronic obstructive pulmonary disease, unspecified: Secondary | ICD-10-CM | POA: Diagnosis not present

## 2020-09-30 DIAGNOSIS — J449 Chronic obstructive pulmonary disease, unspecified: Secondary | ICD-10-CM | POA: Diagnosis not present

## 2020-10-01 DIAGNOSIS — J301 Allergic rhinitis due to pollen: Secondary | ICD-10-CM | POA: Diagnosis not present

## 2020-10-01 DIAGNOSIS — M1991 Primary osteoarthritis, unspecified site: Secondary | ICD-10-CM | POA: Diagnosis not present

## 2020-10-01 DIAGNOSIS — G894 Chronic pain syndrome: Secondary | ICD-10-CM | POA: Diagnosis not present

## 2020-10-01 DIAGNOSIS — M47816 Spondylosis without myelopathy or radiculopathy, lumbar region: Secondary | ICD-10-CM | POA: Diagnosis not present

## 2020-10-01 DIAGNOSIS — M71822 Other specified bursopathies, left elbow: Secondary | ICD-10-CM | POA: Diagnosis not present

## 2020-10-10 DIAGNOSIS — J449 Chronic obstructive pulmonary disease, unspecified: Secondary | ICD-10-CM | POA: Diagnosis not present

## 2020-10-10 DIAGNOSIS — Z87891 Personal history of nicotine dependence: Secondary | ICD-10-CM | POA: Diagnosis not present

## 2020-10-20 DIAGNOSIS — J449 Chronic obstructive pulmonary disease, unspecified: Secondary | ICD-10-CM | POA: Diagnosis not present

## 2020-11-02 DIAGNOSIS — M47816 Spondylosis without myelopathy or radiculopathy, lumbar region: Secondary | ICD-10-CM | POA: Diagnosis not present

## 2020-11-02 DIAGNOSIS — G894 Chronic pain syndrome: Secondary | ICD-10-CM | POA: Diagnosis not present

## 2020-11-02 DIAGNOSIS — I872 Venous insufficiency (chronic) (peripheral): Secondary | ICD-10-CM | POA: Diagnosis not present

## 2020-11-02 DIAGNOSIS — M1991 Primary osteoarthritis, unspecified site: Secondary | ICD-10-CM | POA: Diagnosis not present

## 2020-11-10 DIAGNOSIS — K219 Gastro-esophageal reflux disease without esophagitis: Secondary | ICD-10-CM | POA: Diagnosis not present

## 2020-11-10 DIAGNOSIS — E782 Mixed hyperlipidemia: Secondary | ICD-10-CM | POA: Diagnosis not present

## 2020-11-10 DIAGNOSIS — I10 Essential (primary) hypertension: Secondary | ICD-10-CM | POA: Diagnosis not present

## 2020-11-20 DIAGNOSIS — J449 Chronic obstructive pulmonary disease, unspecified: Secondary | ICD-10-CM | POA: Diagnosis not present

## 2020-11-30 DIAGNOSIS — J449 Chronic obstructive pulmonary disease, unspecified: Secondary | ICD-10-CM | POA: Diagnosis not present

## 2020-11-30 DIAGNOSIS — R600 Localized edema: Secondary | ICD-10-CM | POA: Diagnosis not present

## 2020-11-30 DIAGNOSIS — G894 Chronic pain syndrome: Secondary | ICD-10-CM | POA: Diagnosis not present

## 2020-11-30 DIAGNOSIS — J439 Emphysema, unspecified: Secondary | ICD-10-CM | POA: Diagnosis not present

## 2020-12-10 DIAGNOSIS — Z87891 Personal history of nicotine dependence: Secondary | ICD-10-CM | POA: Diagnosis not present

## 2020-12-10 DIAGNOSIS — J449 Chronic obstructive pulmonary disease, unspecified: Secondary | ICD-10-CM | POA: Diagnosis not present

## 2020-12-20 DIAGNOSIS — J449 Chronic obstructive pulmonary disease, unspecified: Secondary | ICD-10-CM | POA: Diagnosis not present

## 2021-01-05 DIAGNOSIS — I872 Venous insufficiency (chronic) (peripheral): Secondary | ICD-10-CM | POA: Diagnosis not present

## 2021-01-05 DIAGNOSIS — M47816 Spondylosis without myelopathy or radiculopathy, lumbar region: Secondary | ICD-10-CM | POA: Diagnosis not present

## 2021-01-05 DIAGNOSIS — G894 Chronic pain syndrome: Secondary | ICD-10-CM | POA: Diagnosis not present

## 2021-01-20 DIAGNOSIS — J449 Chronic obstructive pulmonary disease, unspecified: Secondary | ICD-10-CM | POA: Diagnosis not present

## 2021-02-02 DIAGNOSIS — Z0000 Encounter for general adult medical examination without abnormal findings: Secondary | ICD-10-CM | POA: Diagnosis not present

## 2021-02-02 DIAGNOSIS — D649 Anemia, unspecified: Secondary | ICD-10-CM | POA: Diagnosis not present

## 2021-02-02 DIAGNOSIS — Z1389 Encounter for screening for other disorder: Secondary | ICD-10-CM | POA: Diagnosis not present

## 2021-02-02 DIAGNOSIS — E538 Deficiency of other specified B group vitamins: Secondary | ICD-10-CM | POA: Diagnosis not present

## 2021-02-02 DIAGNOSIS — R7309 Other abnormal glucose: Secondary | ICD-10-CM | POA: Diagnosis not present

## 2021-02-02 DIAGNOSIS — I4891 Unspecified atrial fibrillation: Secondary | ICD-10-CM | POA: Diagnosis not present

## 2021-02-02 DIAGNOSIS — J449 Chronic obstructive pulmonary disease, unspecified: Secondary | ICD-10-CM | POA: Diagnosis not present

## 2021-02-02 DIAGNOSIS — M47816 Spondylosis without myelopathy or radiculopathy, lumbar region: Secondary | ICD-10-CM | POA: Diagnosis not present

## 2021-02-02 DIAGNOSIS — I872 Venous insufficiency (chronic) (peripheral): Secondary | ICD-10-CM | POA: Diagnosis not present

## 2021-02-02 DIAGNOSIS — E782 Mixed hyperlipidemia: Secondary | ICD-10-CM | POA: Diagnosis not present

## 2021-02-02 DIAGNOSIS — G894 Chronic pain syndrome: Secondary | ICD-10-CM | POA: Diagnosis not present

## 2021-02-02 DIAGNOSIS — I10 Essential (primary) hypertension: Secondary | ICD-10-CM | POA: Diagnosis not present

## 2021-02-10 DIAGNOSIS — J449 Chronic obstructive pulmonary disease, unspecified: Secondary | ICD-10-CM | POA: Diagnosis not present

## 2021-02-10 DIAGNOSIS — Z87891 Personal history of nicotine dependence: Secondary | ICD-10-CM | POA: Diagnosis not present

## 2021-02-20 DIAGNOSIS — J449 Chronic obstructive pulmonary disease, unspecified: Secondary | ICD-10-CM | POA: Diagnosis not present

## 2021-02-24 DIAGNOSIS — G894 Chronic pain syndrome: Secondary | ICD-10-CM | POA: Diagnosis not present

## 2021-02-24 DIAGNOSIS — Z681 Body mass index (BMI) 19 or less, adult: Secondary | ICD-10-CM | POA: Diagnosis not present

## 2021-02-24 DIAGNOSIS — J449 Chronic obstructive pulmonary disease, unspecified: Secondary | ICD-10-CM | POA: Diagnosis not present

## 2021-02-24 DIAGNOSIS — I10 Essential (primary) hypertension: Secondary | ICD-10-CM | POA: Diagnosis not present

## 2021-02-27 ENCOUNTER — Encounter (HOSPITAL_COMMUNITY): Payer: Self-pay

## 2021-02-27 ENCOUNTER — Emergency Department (HOSPITAL_COMMUNITY): Payer: Medicare Other

## 2021-02-27 ENCOUNTER — Inpatient Hospital Stay (HOSPITAL_COMMUNITY)
Admission: EM | Admit: 2021-02-27 | Discharge: 2021-03-09 | DRG: 193 | Disposition: A | Discharge status: Skilled Nursing Facility | Payer: Medicare Other | Attending: Internal Medicine | Admitting: Internal Medicine

## 2021-02-27 ENCOUNTER — Other Ambulatory Visit: Payer: Self-pay

## 2021-02-27 DIAGNOSIS — Z20822 Contact with and (suspected) exposure to covid-19: Secondary | ICD-10-CM | POA: Diagnosis present

## 2021-02-27 DIAGNOSIS — T380X5A Adverse effect of glucocorticoids and synthetic analogues, initial encounter: Secondary | ICD-10-CM | POA: Diagnosis not present

## 2021-02-27 DIAGNOSIS — I509 Heart failure, unspecified: Secondary | ICD-10-CM | POA: Diagnosis not present

## 2021-02-27 DIAGNOSIS — E46 Unspecified protein-calorie malnutrition: Secondary | ICD-10-CM | POA: Diagnosis not present

## 2021-02-27 DIAGNOSIS — E669 Obesity, unspecified: Secondary | ICD-10-CM | POA: Diagnosis present

## 2021-02-27 DIAGNOSIS — R059 Cough, unspecified: Secondary | ICD-10-CM | POA: Diagnosis not present

## 2021-02-27 DIAGNOSIS — I517 Cardiomegaly: Secondary | ICD-10-CM | POA: Diagnosis not present

## 2021-02-27 DIAGNOSIS — R0902 Hypoxemia: Secondary | ICD-10-CM | POA: Diagnosis not present

## 2021-02-27 DIAGNOSIS — I5032 Chronic diastolic (congestive) heart failure: Secondary | ICD-10-CM | POA: Diagnosis not present

## 2021-02-27 DIAGNOSIS — Z8679 Personal history of other diseases of the circulatory system: Secondary | ICD-10-CM

## 2021-02-27 DIAGNOSIS — D6959 Other secondary thrombocytopenia: Secondary | ICD-10-CM | POA: Diagnosis present

## 2021-02-27 DIAGNOSIS — Z8249 Family history of ischemic heart disease and other diseases of the circulatory system: Secondary | ICD-10-CM | POA: Diagnosis not present

## 2021-02-27 DIAGNOSIS — I4892 Unspecified atrial flutter: Secondary | ICD-10-CM | POA: Diagnosis not present

## 2021-02-27 DIAGNOSIS — J441 Chronic obstructive pulmonary disease with (acute) exacerbation: Secondary | ICD-10-CM | POA: Diagnosis present

## 2021-02-27 DIAGNOSIS — Z8541 Personal history of malignant neoplasm of cervix uteri: Secondary | ICD-10-CM | POA: Diagnosis not present

## 2021-02-27 DIAGNOSIS — Z833 Family history of diabetes mellitus: Secondary | ICD-10-CM | POA: Diagnosis not present

## 2021-02-27 DIAGNOSIS — I421 Obstructive hypertrophic cardiomyopathy: Secondary | ICD-10-CM | POA: Diagnosis present

## 2021-02-27 DIAGNOSIS — Z888 Allergy status to other drugs, medicaments and biological substances status: Secondary | ICD-10-CM

## 2021-02-27 DIAGNOSIS — R778 Other specified abnormalities of plasma proteins: Secondary | ICD-10-CM | POA: Diagnosis not present

## 2021-02-27 DIAGNOSIS — J440 Chronic obstructive pulmonary disease with acute lower respiratory infection: Secondary | ICD-10-CM | POA: Diagnosis not present

## 2021-02-27 DIAGNOSIS — E441 Mild protein-calorie malnutrition: Secondary | ICD-10-CM | POA: Diagnosis not present

## 2021-02-27 DIAGNOSIS — Z87891 Personal history of nicotine dependence: Secondary | ICD-10-CM | POA: Diagnosis not present

## 2021-02-27 DIAGNOSIS — I5033 Acute on chronic diastolic (congestive) heart failure: Secondary | ICD-10-CM | POA: Diagnosis present

## 2021-02-27 DIAGNOSIS — R7989 Other specified abnormal findings of blood chemistry: Secondary | ICD-10-CM | POA: Diagnosis present

## 2021-02-27 DIAGNOSIS — Z23 Encounter for immunization: Secondary | ICD-10-CM

## 2021-02-27 DIAGNOSIS — R0602 Shortness of breath: Secondary | ICD-10-CM | POA: Diagnosis not present

## 2021-02-27 DIAGNOSIS — J189 Pneumonia, unspecified organism: Secondary | ICD-10-CM | POA: Diagnosis present

## 2021-02-27 DIAGNOSIS — F05 Delirium due to known physiological condition: Secondary | ICD-10-CM | POA: Diagnosis not present

## 2021-02-27 DIAGNOSIS — M5136 Other intervertebral disc degeneration, lumbar region: Secondary | ICD-10-CM | POA: Diagnosis present

## 2021-02-27 DIAGNOSIS — Z6837 Body mass index (BMI) 37.0-37.9, adult: Secondary | ICD-10-CM

## 2021-02-27 DIAGNOSIS — R739 Hyperglycemia, unspecified: Secondary | ICD-10-CM | POA: Diagnosis not present

## 2021-02-27 DIAGNOSIS — I5030 Unspecified diastolic (congestive) heart failure: Secondary | ICD-10-CM | POA: Diagnosis not present

## 2021-02-27 DIAGNOSIS — Z7982 Long term (current) use of aspirin: Secondary | ICD-10-CM

## 2021-02-27 DIAGNOSIS — R6889 Other general symptoms and signs: Secondary | ICD-10-CM | POA: Diagnosis not present

## 2021-02-27 DIAGNOSIS — I248 Other forms of acute ischemic heart disease: Secondary | ICD-10-CM | POA: Diagnosis present

## 2021-02-27 DIAGNOSIS — Z79899 Other long term (current) drug therapy: Secondary | ICD-10-CM

## 2021-02-27 DIAGNOSIS — K219 Gastro-esophageal reflux disease without esophagitis: Secondary | ICD-10-CM | POA: Diagnosis present

## 2021-02-27 DIAGNOSIS — J449 Chronic obstructive pulmonary disease, unspecified: Secondary | ICD-10-CM

## 2021-02-27 DIAGNOSIS — Z743 Need for continuous supervision: Secondary | ICD-10-CM | POA: Diagnosis not present

## 2021-02-27 DIAGNOSIS — J45998 Other asthma: Secondary | ICD-10-CM | POA: Diagnosis not present

## 2021-02-27 DIAGNOSIS — J9601 Acute respiratory failure with hypoxia: Secondary | ICD-10-CM | POA: Diagnosis not present

## 2021-02-27 DIAGNOSIS — R9431 Abnormal electrocardiogram [ECG] [EKG]: Secondary | ICD-10-CM | POA: Diagnosis not present

## 2021-02-27 DIAGNOSIS — B370 Candidal stomatitis: Secondary | ICD-10-CM | POA: Diagnosis not present

## 2021-02-27 DIAGNOSIS — I480 Paroxysmal atrial fibrillation: Secondary | ICD-10-CM | POA: Diagnosis not present

## 2021-02-27 DIAGNOSIS — J9691 Respiratory failure, unspecified with hypoxia: Secondary | ICD-10-CM | POA: Diagnosis present

## 2021-02-27 DIAGNOSIS — I499 Cardiac arrhythmia, unspecified: Secondary | ICD-10-CM | POA: Diagnosis not present

## 2021-02-27 LAB — RESP PANEL BY RT-PCR (FLU A&B, COVID) ARPGX2
Influenza A by PCR: NEGATIVE
Influenza B by PCR: NEGATIVE
SARS Coronavirus 2 by RT PCR: NEGATIVE

## 2021-02-27 LAB — CBC WITH DIFFERENTIAL/PLATELET
Abs Immature Granulocytes: 0.19 10*3/uL — ABNORMAL HIGH (ref 0.00–0.07)
Basophils Absolute: 0.1 10*3/uL (ref 0.0–0.1)
Basophils Relative: 0 %
Eosinophils Absolute: 0.1 10*3/uL (ref 0.0–0.5)
Eosinophils Relative: 0 %
HCT: 32.4 % — ABNORMAL LOW (ref 36.0–46.0)
Hemoglobin: 9.8 g/dL — ABNORMAL LOW (ref 12.0–15.0)
Immature Granulocytes: 1 %
Lymphocytes Relative: 7 %
Lymphs Abs: 1.3 10*3/uL (ref 0.7–4.0)
MCH: 27.5 pg (ref 26.0–34.0)
MCHC: 30.2 g/dL (ref 30.0–36.0)
MCV: 90.8 fL (ref 80.0–100.0)
Monocytes Absolute: 1.9 10*3/uL — ABNORMAL HIGH (ref 0.1–1.0)
Monocytes Relative: 11 %
Neutro Abs: 13.7 10*3/uL — ABNORMAL HIGH (ref 1.7–7.7)
Neutrophils Relative %: 81 %
Platelets: 468 10*3/uL — ABNORMAL HIGH (ref 150–400)
RBC: 3.57 MIL/uL — ABNORMAL LOW (ref 3.87–5.11)
RDW: 17.3 % — ABNORMAL HIGH (ref 11.5–15.5)
WBC: 17.3 10*3/uL — ABNORMAL HIGH (ref 4.0–10.5)
nRBC: 0 % (ref 0.0–0.2)

## 2021-02-27 LAB — COMPREHENSIVE METABOLIC PANEL
ALT: 16 U/L (ref 0–44)
AST: 14 U/L — ABNORMAL LOW (ref 15–41)
Albumin: 3.3 g/dL — ABNORMAL LOW (ref 3.5–5.0)
Alkaline Phosphatase: 120 U/L (ref 38–126)
Anion gap: 5 (ref 5–15)
BUN: 15 mg/dL (ref 6–20)
CO2: 27 mmol/L (ref 22–32)
Calcium: 8.1 mg/dL — ABNORMAL LOW (ref 8.9–10.3)
Chloride: 99 mmol/L (ref 98–111)
Creatinine, Ser: 0.62 mg/dL (ref 0.44–1.00)
GFR, Estimated: >60 mL/min (ref >60)
Glucose, Bld: 123 mg/dL — ABNORMAL HIGH (ref 70–99)
Potassium: 4.1 mmol/L (ref 3.5–5.1)
Sodium: 131 mmol/L — ABNORMAL LOW (ref 135–145)
Total Bilirubin: 1 mg/dL (ref 0.3–1.2)
Total Protein: 6.8 g/dL (ref 6.5–8.1)

## 2021-02-27 LAB — TROPONIN I (HIGH SENSITIVITY)
Troponin I (High Sensitivity): 38 ng/L — ABNORMAL HIGH (ref <18)
Troponin I (High Sensitivity): 32 ng/L — ABNORMAL HIGH (ref <18)

## 2021-02-27 LAB — BRAIN NATRIURETIC PEPTIDE: B Natriuretic Peptide: 610 pg/mL — ABNORMAL HIGH (ref 0.0–100.0)

## 2021-02-27 MED ORDER — SODIUM CHLORIDE 0.9 % IV SOLN
500.0000 mg | INTRAVENOUS | Status: DC
  Administered 2021-02-28 – 2021-03-04 (×5): 500 mg via INTRAVENOUS
  Filled 2021-02-27 (×5): qty 500

## 2021-02-27 MED ORDER — METHYLPREDNISOLONE SODIUM SUCC 125 MG IJ SOLR
125.0000 mg | Freq: Once | INTRAMUSCULAR | Status: AC
  Administered 2021-02-27: 125 mg via INTRAVENOUS
  Filled 2021-02-27: qty 2

## 2021-02-27 MED ORDER — IPRATROPIUM-ALBUTEROL 0.5-2.5 (3) MG/3ML IN SOLN
5.0000 mL | Freq: Once | RESPIRATORY_TRACT | Status: AC
  Administered 2021-02-27: 5 mL via RESPIRATORY_TRACT
  Filled 2021-02-27: qty 6

## 2021-02-27 MED ORDER — SODIUM CHLORIDE 0.9 % IV SOLN
1.0000 g | Freq: Once | INTRAVENOUS | Status: AC
  Administered 2021-02-28: 1 g via INTRAVENOUS
  Filled 2021-02-27: qty 10

## 2021-02-27 NOTE — ED Notes (Signed)
Pt. Is sitting in chair at bedside. Pt. States it is more comfortable than the bed.

## 2021-02-27 NOTE — ED Triage Notes (Signed)
Pt arrived via EMS. Pt complaining of SOB x1 week. Pt went to PCP and was given steroid shot and ABX with no relief. Pt coughing up green/brown phlegm. Hx of CHF and COPD.

## 2021-02-27 NOTE — ED Notes (Signed)
Pt placed on 2L [place]

## 2021-02-27 NOTE — ED Notes (Signed)
Patient transported to CT 

## 2021-02-27 NOTE — ED Provider Notes (Signed)
[facility] EMERGENCY DEPARTMENT Provider Note   CSN: [id number] Arrival date & time: 02/27/21  1738     History Chief Complaint  Patient presents with   Shortness of Kathleen Wright is a 55 y.o. female.  HPI  Patient with significant medical history of asthma, cervical cancer, COPD, asthma, proximal A. fib currently on anticoagulant presents with chief complaint of shortness of breath.  Patient states this has been going for about 1 week's time.  Patient states that she has been having a productive sputum states that she noticed blood in it, she was started on azithromycin and prednisone on Monday has but still feels unwell.  States the shortness of breath  is constant, worsening with exertion, has associated chest pressure and pedal edema.  Chest pressure does not radiate, did not become diaphoretic, no associated nausea,  vomiting lightheaded or dizziness.  Other then recent cardioversion has no other cardiac history, no sick PEs or DVTs, currently on anticoagulant.  Patient denies recent sick contacts, is up-to-date on her COVID-vaccine, she is not immunocompromise.  She has no other complaints.  Past Medical History:  Diagnosis Date   Asthma    Cervical cancer ([place])    a. treated with partial removal of cervix   COPD (chronic obstructive pulmonary disease) ([place])    Hypertrophic cardiomyopathy ([place])    a. s/p myomectomy at [facility] 08/2004   Paroxysmal atrial flutter ([facility])    a. post-op after myomectomy in 2006. s/p TEE/DCCV.   Transient atrial fibrillation ([place]) 2006   a. after myomectomy at [facility] in 08/2004 - was on amiodarone but developed elevated LFTs felt possibly secondary to amiodarone     Patient Active Problem List   Diagnosis Date Noted   Left carotid bruit 08/27/2015   COPD exacerbation ([place]) 08/10/2015   PAF (paroxysmal atrial fibrillation) ([place]) 08/10/2015   Hypertrophic obstructive cardiomyopathy ([place]) 06/10/2015   Atypical atrial flutter ([place]) 06/10/2015    History of atrial fibrillation 06/10/2015   Elevated troponin 06/10/2015   COPD (chronic obstructive pulmonary disease) ([place]) 06/10/2015   Tobacco abuse 06/10/2015   Chest pain 06/09/2015   History of ventricular septal myectomy 06/09/2015   Palpitations 06/09/2015   H N P-LUMBAR 09/17/2008   DEGENERATIVE [place] DISEASE, LUMBAR SPINE 09/17/2008   HIGH BLOOD PRESSURE 09/17/2008    Past Surgical History:  Procedure Laterality Date   CARDIOVERSION N/A 08/12/2015   Procedure: CARDIOVERSION;  Surgeon: Satira Sark, MD;  [facility];  Service: Cardiovascular;  Laterality: N/A;   CARDIOVERSION N/A 08/24/2020   Procedure: CARDIOVERSION;  Surgeon: Jerline Pain, MD;  Location: [place];  Service: Cardiovascular;  Laterality: N/A;   Septal myomectomy  2006   [facility]   TEE WITHOUT CARDIOVERSION N/A 03/29/2016   Procedure: TRANSESOPHAGEAL ECHOCARDIOGRAM (TEE);  Surgeon: Lelon Perla, MD;  Location: [facility] ENDOSCOPY;  Service: Cardiovascular;  Laterality: N/A;   TEE WITHOUT CARDIOVERSION N/A 06/23/2016   Procedure: TRANSESOPHAGEAL ECHOCARDIOGRAM (TEE);  Surgeon: Thayer Headings, MD;  Location: [place];  Service: Cardiovascular;  Laterality: N/A;   TUBAL LIGATION       OB History   No obstetric history on file.     Family History  Problem Relation Age of Onset   Heart failure Father    Diabetes Other    Lung disease Other     Social History   Tobacco Use   Smoking status: Former    Packs/day: 0.50    Types: Cigarettes  Quit date: 12/11/2020    Years since quitting: 0.2   Smokeless tobacco: Never   Tobacco comments:    6-8 cigarettes daily  Vaping Use   Vaping Use: Never used  Substance Use Topics   Alcohol use: No    Alcohol/week: 0.0 standard drinks   Drug use: No    Home Medications Prior to Admission medications   Medication Sig Start Date End Date Taking? Authorizing Provider  albuterol (PROVENTIL) (2.5 MG/3ML) 0.083% nebulizer solution Take 2.5  mg by nebulization every 6 (six) hours as needed for wheezing or shortness of breath.   Yes [provider]  aspirin EC 81 MG tablet Take 81 mg by mouth daily.   Yes [provider]  azithromycin (ZITHROMAX) 250 MG tablet Take 250 mg by mouth daily. Starting 9.14.22 02/24/21  Yes [provider]  dabigatran (PRADAXA) 150 MG CAPS capsule Take 1 capsule (150 mg total) by mouth 2 (two) times daily. 06/27/18  Yes [name], NP  diltiazem (CARDIZEM CD) 180 MG 24 hr capsule Take 180 mg by mouth daily. 02/02/21  Yes [provider]  diltiazem (CARDIZEM) 30 MG tablet TAKE (1) TABLET BY MOUTH EVERY FOUR HOURS AS NEEDED FOR AFIB HEART RATE OVER 100. Patient taking differently: Take 30 mg by mouth every 4 (four) hours as needed (AFIB HEART RATE OVER 100). 10/26/18  Yes [name], NP  furosemide (LASIX) 20 MG tablet Take 20 mg by mouth daily as needed for edema.    Yes [provider]  HYDROcodone-acetaminophen (NORCO) 10-325 MG tablet Take 1 tablet by mouth every 6 (six) hours as needed for moderate pain.  08/22/19  Yes [provider]  ibuprofen (ADVIL,MOTRIN) 800 MG tablet Take 800-1,600 mg by mouth 3 (three) times daily as needed for moderate pain.  11/02/15  Yes [provider]  ipratropium (ATROVENT) 0.02 % nebulizer solution Take 0.25 mg by nebulization every 4 (four) hours as needed for wheezing or shortness of breath. 08/04/20  Yes [provider]  metoprolol tartrate (LOPRESSOR) 50 MG tablet Take 50 mg by mouth 2 (two) times daily.   Yes [provider]  omeprazole (PRILOSEC) 40 MG capsule Take 40 mg by mouth daily. 02/11/21  Yes [provider]  potassium chloride (KLOR-CON) 10 MEQ tablet Take 10 mEq by mouth daily as needed (when taking lasix).   Yes [provider]  PROAIR HFA 108 (90 Base) MCG/ACT inhaler Inhale 2 puffs into the lungs every 6 (six) hours as needed for wheezing or shortness of  breath.  05/27/15  Yes [provider]  triamcinolone (KENALOG) 0.1 % Apply 1 application topically daily as needed (Blister). 08/04/20  Yes [provider]  varenicline (CHANTIX) 1 MG tablet Take 1 mg by mouth daily. 02/26/21  Yes [provider]    Allergies    Amiodarone  Review of Systems   Review of Systems  Constitutional:  Negative for chills and fever.  HENT:  Negative for congestion.   Respiratory:  Positive for cough and shortness of breath.   Cardiovascular:  Positive for chest pain and leg swelling.  Gastrointestinal:  Negative for abdominal pain, diarrhea, nausea and vomiting.  Genitourinary:  Negative for enuresis.  Musculoskeletal:  Negative for back pain.  Skin:  Negative for rash.  Neurological:  Negative for dizziness and headaches.  Hematological:  Does not bruise/bleed easily.   Physical Exam Updated Vital Signs BP 134/77   Pulse 70   Temp 98.7 F (37.1 C) (  Oral)   Resp (!) 21   Ht '5\' 1"'$  (1.549 m)   Wt 81.6 kg   SpO2 98%   BMI 34.01 kg/m   Physical Exam Vitals and nursing note reviewed.  Constitutional:      General: She is not in acute distress.    Appearance: She is not ill-appearing.  HENT:     Head: Normocephalic and atraumatic.     Nose: No congestion.     Mouth/Throat:     Mouth: Mucous membranes are moist.     Pharynx: Oropharynx is clear. No oropharyngeal exudate or posterior oropharyngeal erythema.  Eyes:     Conjunctiva/sclera: Conjunctivae normal.  Cardiovascular:     Rate and Rhythm: Normal rate and regular rhythm.     Pulses: Normal pulses.     Heart sounds: No murmur heard.   No friction rub. No gallop.  Pulmonary:     Effort: No respiratory distress.     Breath sounds: Wheezing and rales present. No rhonchi.     Comments: Patient becomes hypoxic while not on room air, dips into the upper 80s low 90s, patient was placed on 2 L via nasal cannula remains mid 90s, patient has a tight sounding chest,  bibasilar Rales with intermittent wheezing heard worse on the right versus the left. Chest:     Chest wall: Tenderness present.  Abdominal:     Palpations: Abdomen is soft.     Tenderness: There is no abdominal tenderness. There is no right CVA tenderness or left CVA tenderness.  Musculoskeletal:     Right lower leg: Edema present.     Left lower leg: Edema present.     Comments: Patient has 1+ pitting edema up to the shins legs your symmetrical bilaterally neurovascular fully intact.  No lymphedema changes present  Skin:    General: Skin is warm and dry.  Neurological:     Mental Status: She is alert.  Psychiatric:        Mood and Affect: Mood normal.    ED Results / Procedures / Treatments   Labs (all labs ordered are listed, but only abnormal results are displayed) Labs Reviewed  COMPREHENSIVE METABOLIC PANEL - Abnormal; Notable for the following components:      Result Value   Sodium 131 (*)    Glucose, Bld 123 (*)    Calcium 8.1 (*)    Albumin 3.3 (*)    AST 14 (*)    All other components within normal limits  CBC WITH DIFFERENTIAL/PLATELET - Abnormal; Notable for the following components:   WBC 17.3 (*)    RBC 3.57 (*)    Hemoglobin 9.8 (*)    HCT 32.4 (*)    RDW 17.3 (*)    Platelets 468 (*)    Neutro Abs 13.7 (*)    Monocytes Absolute 1.9 (*)    Abs Immature Granulocytes 0.19 (*)    All other components within normal limits  BRAIN NATRIURETIC PEPTIDE - Abnormal; Notable for the following components:   B Natriuretic Peptide 610.0 (*)    All other components within normal limits  TROPONIN I (HIGH SENSITIVITY) - Abnormal; Notable for the following components:   Troponin I (High Sensitivity) 38 (*)    All other components within normal limits  TROPONIN I (HIGH SENSITIVITY) - Abnormal; Notable for the following components:   Troponin I (High Sensitivity) 32 (*)    All other components within normal limits  RESP PANEL BY RT-PCR (FLU A&B, COVID) ARPGX2  CULTURE,  BLOOD (ROUTINE X 2)  CULTURE, BLOOD (ROUTINE X 2)  BLOOD GAS, VENOUS    EKG EKG Interpretation  Date/Time:  Saturday February 27 2021 20:26:48 EDT Ventricular Rate:  68 PR Interval:  197 QRS Duration: 125 QT Interval:  435 QTC Calculation: 463 R Axis:   -64 Text Interpretation: Sinus rhythm RAE, consider biatrial enlargement Nonspecific IVCD with LAD LVH with secondary repolarization abnormality ST deppressions in lateral leads seen in previous ecgs Confirmed by [place] ([id number]) on 02/27/2021 8:38:03 PM  Radiology DG Chest Port 1 View  Result Date: 02/27/2021 CLINICAL DATA:  Cough. Shortness of breath for 1 week. Former smoker. EXAM: PORTABLE CHEST 1 VIEW COMPARISON:  12/27/2015 FINDINGS: Postoperative changes in the mediastinum. Mild cardiac enlargement. No airspace disease or consolidation in the lungs. No pleural effusions. No pneumothorax. Mediastinal contours appear intact. Degenerative changes in the shoulders. IMPRESSION: Mild cardiac enlargement.  No evidence of active pulmonary disease. Electronically Signed   By: Lucienne Capers M.D.   On: 02/27/2021 20:23    Procedures .Critical Care Performed by: Marcello Fennel, PA-C Authorized by: Marcello Fennel, PA-C   Critical care provider statement:    Critical care time (minutes):  45   Critical care was necessary to treat or prevent imminent or life-threatening deterioration of the following conditions:  Respiratory failure   Critical care was time spent personally by me on the following activities:  Discussions with consultants, evaluation of patient's response to treatment, examination of patient, ordering and performing treatments and interventions, ordering and review of laboratory studies, ordering and review of radiographic studies, pulse oximetry, re-evaluation of patient's condition, obtaining history from patient or surrogate and review of old charts   I assumed direction of critical care for this patient  from another provider in my specialty: no     Care discussed with: admitting provider     Medications Ordered in ED Medications  cefTRIAXone (ROCEPHIN) 1 g in sodium chloride 0.9 % 100 mL IVPB (has no administration in time range)  azithromycin (ZITHROMAX) 500 mg in sodium chloride 0.9 % 250 mL IVPB (has no administration in time range)  ipratropium-albuterol (DUONEB) 0.5-2.5 (3) MG/3ML nebulizer solution 5 mL (5 mLs Nebulization Given 02/27/21 2241)  methylPREDNISolone sodium succinate (SOLU-MEDROL) 125 mg/2 mL injection 125 mg (125 mg Intravenous Given 02/27/21 2346)    ED Course  I have reviewed the triage vital signs and the nursing notes.  Pertinent labs & imaging results that were available during my care of the patient were reviewed by me and considered in my medical decision making (see chart for details).    MDM Rules/Calculators/A&P                          Initial impression-patient presents with shortness of breath.  She is alert, does not appear in distress, vital signs reassuring.  Suspect COPD exacerbation.  Will obtain basic lab work-up, provide patient with albuterol and reassess.  Work-up-CBC shows leukocytosis of 17.3, normocytic anemia hemoglobin of 9.8, CMP showing hyponatremia of 131, hyperglycemia 123, calcium 8.1, BNP 610, first troponin is 48, second opponent is 32, respiratory panel negative, chest x-ray negative for acute findings.  EKG sinus without signs of ischemia.  Reassessment-patient was assessed after DuoNeb lung sounds have slightly improved but still is tight sounding has wheezing.  Due to hemoptysis and unremarkable chest x-ray concern for possible PE will send patient for CTA for further evaluation.  CTA is pending at this  time, will recommend hospital admission due to acute hypoxia patient agreement this plan.  Will consult with hospitalist for further recommendations.  Likely patient is suffering from COPD exacerbation, will obtain blood cultures,  will start her on antibiotics.  Consult-spoke with Dr. Nori Riis who will admit the patient.  Rule out-I have low suspicion for ACS as history is atypical, , EKG was sinus rhythm without signs of ischemia, patient does have a elevated troponin but it is downtrending, suspect possible demand ischemia from slight volume overloaded with a BNP of 610.  Low suspicion for PE as patient is currently on anticoagulant, history is more consistent with COPD exacerbation but cannot exclude,  CT angio pending at this time.  Low suspicion for AAA or aortic dissection as history is atypical, patient has low risk factors.  Low suspicion for CHF exacerbation as there is no noted crackles on my exam, chest x-ray is negative for acute findings.  l low suspicion for systemic infection as patient is nontoxic-appearing, vital signs reassuring, she does have noted leukocytosis unclear etiology possible infection versus recent steroid use.   Plan-admit due to respiratory failure likely secondary due to COPD exacerbation.  Final Clinical Impression(s) / ED Diagnoses Final diagnoses:  Acute respiratory failure with hypoxia ([place])  COPD exacerbation [facility])    Rx / DC Orders ED Discharge Orders     None        Marcello Fennel, PA-C 02/28/21 0002    Teressa Lower, MD 02/28/21 [phone]

## 2021-02-28 ENCOUNTER — Inpatient Hospital Stay (HOSPITAL_COMMUNITY): Payer: Medicare Other

## 2021-02-28 ENCOUNTER — Other Ambulatory Visit (HOSPITAL_COMMUNITY): Payer: Self-pay | Admitting: *Deleted

## 2021-02-28 DIAGNOSIS — I5030 Unspecified diastolic (congestive) heart failure: Secondary | ICD-10-CM

## 2021-02-28 DIAGNOSIS — J9601 Acute respiratory failure with hypoxia: Secondary | ICD-10-CM | POA: Diagnosis not present

## 2021-02-28 DIAGNOSIS — J440 Chronic obstructive pulmonary disease with acute lower respiratory infection: Secondary | ICD-10-CM | POA: Diagnosis not present

## 2021-02-28 DIAGNOSIS — I4892 Unspecified atrial flutter: Secondary | ICD-10-CM | POA: Diagnosis present

## 2021-02-28 DIAGNOSIS — B370 Candidal stomatitis: Secondary | ICD-10-CM | POA: Diagnosis not present

## 2021-02-28 DIAGNOSIS — Z6837 Body mass index (BMI) 37.0-37.9, adult: Secondary | ICD-10-CM | POA: Diagnosis not present

## 2021-02-28 DIAGNOSIS — E441 Mild protein-calorie malnutrition: Secondary | ICD-10-CM

## 2021-02-28 DIAGNOSIS — I480 Paroxysmal atrial fibrillation: Secondary | ICD-10-CM | POA: Diagnosis present

## 2021-02-28 DIAGNOSIS — R0602 Shortness of breath: Secondary | ICD-10-CM | POA: Diagnosis not present

## 2021-02-28 DIAGNOSIS — M5136 Other intervertebral disc degeneration, lumbar region: Secondary | ICD-10-CM | POA: Diagnosis present

## 2021-02-28 DIAGNOSIS — Z87891 Personal history of nicotine dependence: Secondary | ICD-10-CM | POA: Diagnosis not present

## 2021-02-28 DIAGNOSIS — Z8679 Personal history of other diseases of the circulatory system: Secondary | ICD-10-CM

## 2021-02-28 DIAGNOSIS — J189 Pneumonia, unspecified organism: Secondary | ICD-10-CM | POA: Diagnosis present

## 2021-02-28 DIAGNOSIS — E669 Obesity, unspecified: Secondary | ICD-10-CM | POA: Diagnosis present

## 2021-02-28 DIAGNOSIS — K219 Gastro-esophageal reflux disease without esophagitis: Secondary | ICD-10-CM | POA: Diagnosis present

## 2021-02-28 DIAGNOSIS — Z833 Family history of diabetes mellitus: Secondary | ICD-10-CM | POA: Diagnosis not present

## 2021-02-28 DIAGNOSIS — R059 Cough, unspecified: Secondary | ICD-10-CM | POA: Diagnosis not present

## 2021-02-28 DIAGNOSIS — I5033 Acute on chronic diastolic (congestive) heart failure: Secondary | ICD-10-CM | POA: Diagnosis not present

## 2021-02-28 DIAGNOSIS — D6959 Other secondary thrombocytopenia: Secondary | ICD-10-CM | POA: Diagnosis present

## 2021-02-28 DIAGNOSIS — Z20822 Contact with and (suspected) exposure to covid-19: Secondary | ICD-10-CM | POA: Diagnosis not present

## 2021-02-28 DIAGNOSIS — T380X5A Adverse effect of glucocorticoids and synthetic analogues, initial encounter: Secondary | ICD-10-CM | POA: Diagnosis not present

## 2021-02-28 DIAGNOSIS — J9691 Respiratory failure, unspecified with hypoxia: Secondary | ICD-10-CM | POA: Diagnosis present

## 2021-02-28 DIAGNOSIS — R778 Other specified abnormalities of plasma proteins: Secondary | ICD-10-CM | POA: Diagnosis not present

## 2021-02-28 DIAGNOSIS — F05 Delirium due to known physiological condition: Secondary | ICD-10-CM | POA: Diagnosis not present

## 2021-02-28 DIAGNOSIS — Z23 Encounter for immunization: Secondary | ICD-10-CM | POA: Diagnosis not present

## 2021-02-28 DIAGNOSIS — I509 Heart failure, unspecified: Secondary | ICD-10-CM | POA: Diagnosis not present

## 2021-02-28 DIAGNOSIS — J441 Chronic obstructive pulmonary disease with (acute) exacerbation: Secondary | ICD-10-CM | POA: Diagnosis present

## 2021-02-28 DIAGNOSIS — Z8249 Family history of ischemic heart disease and other diseases of the circulatory system: Secondary | ICD-10-CM | POA: Diagnosis not present

## 2021-02-28 DIAGNOSIS — I248 Other forms of acute ischemic heart disease: Secondary | ICD-10-CM | POA: Diagnosis not present

## 2021-02-28 DIAGNOSIS — I421 Obstructive hypertrophic cardiomyopathy: Secondary | ICD-10-CM | POA: Diagnosis not present

## 2021-02-28 DIAGNOSIS — Z8541 Personal history of malignant neoplasm of cervix uteri: Secondary | ICD-10-CM | POA: Diagnosis not present

## 2021-02-28 LAB — ECHOCARDIOGRAM COMPLETE
Area-P 1/2: 3.85 cm2
Height: 61 in
MV M vel: 4.46 m/s
MV Peak grad: 79.6 mmHg
Radius: 0.4 cm
S' Lateral: 2.6 cm
Weight: 2880 oz

## 2021-02-28 LAB — COMPREHENSIVE METABOLIC PANEL
ALT: 16 U/L (ref 0–44)
AST: 16 U/L (ref 15–41)
Albumin: 3.2 g/dL — ABNORMAL LOW (ref 3.5–5.0)
Alkaline Phosphatase: 108 U/L (ref 38–126)
Anion gap: 7 (ref 5–15)
BUN: 15 mg/dL (ref 6–20)
CO2: 25 mmol/L (ref 22–32)
Calcium: 8.4 mg/dL — ABNORMAL LOW (ref 8.9–10.3)
Chloride: 101 mmol/L (ref 98–111)
Creatinine, Ser: 0.64 mg/dL (ref 0.44–1.00)
GFR, Estimated: >60 mL/min (ref >60)
Glucose, Bld: 200 mg/dL — ABNORMAL HIGH (ref 70–99)
Potassium: 4.8 mmol/L (ref 3.5–5.1)
Sodium: 133 mmol/L — ABNORMAL LOW (ref 135–145)
Total Bilirubin: 0.9 mg/dL (ref 0.3–1.2)
Total Protein: 6.6 g/dL (ref 6.5–8.1)

## 2021-02-28 LAB — CBC
HCT: 30.8 % — ABNORMAL LOW (ref 36.0–46.0)
Hemoglobin: 9.2 g/dL — ABNORMAL LOW (ref 12.0–15.0)
MCH: 27.9 pg (ref 26.0–34.0)
MCHC: 29.9 g/dL — ABNORMAL LOW (ref 30.0–36.0)
MCV: 93.3 fL (ref 80.0–100.0)
Platelets: 464 10*3/uL — ABNORMAL HIGH (ref 150–400)
RBC: 3.3 MIL/uL — ABNORMAL LOW (ref 3.87–5.11)
RDW: 17.4 % — ABNORMAL HIGH (ref 11.5–15.5)
WBC: 16.6 10*3/uL — ABNORMAL HIGH (ref 4.0–10.5)
nRBC: 0 % (ref 0.0–0.2)

## 2021-02-28 LAB — MAGNESIUM: Magnesium: 1.9 mg/dL (ref 1.7–2.4)

## 2021-02-28 LAB — BLOOD GAS, VENOUS
Acid-Base Excess: 0.6 mmol/L (ref 0.0–2.0)
Bicarbonate: 22.6 mmol/L (ref 20.0–28.0)
FIO2: 36
O2 Saturation: 40.6 %
Patient temperature: 36.8
pCO2, Ven: 63.1 mmHg — ABNORMAL HIGH (ref 44.0–60.0)
pH, Ven: 7.254 (ref 7.250–7.430)
pO2, Ven: <31 mmHg — CL (ref 32.0–45.0)

## 2021-02-28 LAB — HIV ANTIBODY (ROUTINE TESTING W REFLEX): HIV Screen 4th Generation wRfx: NONREACTIVE

## 2021-02-28 LAB — GLUCOSE, CAPILLARY
Glucose-Capillary: 201 mg/dL — ABNORMAL HIGH (ref 70–99)
Glucose-Capillary: 173 mg/dL — ABNORMAL HIGH (ref 70–99)

## 2021-02-28 MED ORDER — DILTIAZEM HCL ER COATED BEADS 180 MG PO CP24
180.0000 mg | ORAL_CAPSULE | Freq: Every day | ORAL | Status: DC
  Administered 2021-02-28 – 2021-03-09 (×7): 180 mg via ORAL
  Filled 2021-02-28 (×10): qty 1

## 2021-02-28 MED ORDER — ACETAMINOPHEN 325 MG PO TABS
650.0000 mg | ORAL_TABLET | Freq: Four times a day (QID) | ORAL | Status: DC | PRN
  Administered 2021-02-28 – 2021-03-05 (×5): 650 mg via ORAL
  Filled 2021-02-28 (×5): qty 2

## 2021-02-28 MED ORDER — ARFORMOTEROL TARTRATE 15 MCG/2ML IN NEBU
15.0000 ug | INHALATION_SOLUTION | Freq: Two times a day (BID) | RESPIRATORY_TRACT | Status: DC
  Administered 2021-02-28 – 2021-03-09 (×18): 15 ug via RESPIRATORY_TRACT
  Filled 2021-02-28 (×18): qty 2

## 2021-02-28 MED ORDER — PANTOPRAZOLE SODIUM 40 MG PO TBEC
40.0000 mg | DELAYED_RELEASE_TABLET | Freq: Every day | ORAL | Status: DC
  Administered 2021-02-28 – 2021-03-09 (×10): 40 mg via ORAL
  Filled 2021-02-28 (×10): qty 1

## 2021-02-28 MED ORDER — OXYCODONE HCL 5 MG PO TABS
5.0000 mg | ORAL_TABLET | ORAL | Status: DC | PRN
  Administered 2021-02-28 – 2021-03-02 (×3): 5 mg via ORAL
  Filled 2021-02-28 (×3): qty 1

## 2021-02-28 MED ORDER — DM-GUAIFENESIN ER 30-600 MG PO TB12
1.0000 | ORAL_TABLET | Freq: Two times a day (BID) | ORAL | Status: DC
  Administered 2021-02-28 – 2021-03-09 (×19): 1 via ORAL
  Filled 2021-02-28 (×20): qty 1

## 2021-02-28 MED ORDER — ASPIRIN EC 81 MG PO TBEC
81.0000 mg | DELAYED_RELEASE_TABLET | Freq: Every day | ORAL | Status: DC
Start: 2021-02-28 — End: 2021-03-09
  Administered 2021-02-28 – 2021-03-09 (×10): 81 mg via ORAL
  Filled 2021-02-28 (×10): qty 1

## 2021-02-28 MED ORDER — ONDANSETRON HCL 4 MG PO TABS
4.0000 mg | ORAL_TABLET | Freq: Four times a day (QID) | ORAL | Status: DC | PRN
  Administered 2021-02-28 – 2021-03-02 (×2): 4 mg via ORAL
  Filled 2021-02-28 (×2): qty 1

## 2021-02-28 MED ORDER — DILTIAZEM HCL 30 MG PO TABS
30.0000 mg | ORAL_TABLET | ORAL | Status: DC | PRN
  Administered 2021-03-03 – 2021-03-07 (×4): 30 mg via ORAL
  Filled 2021-02-28 (×4): qty 1

## 2021-02-28 MED ORDER — IOHEXOL 350 MG/ML SOLN
75.0000 mL | Freq: Once | INTRAVENOUS | Status: AC | PRN
  Administered 2021-02-28: 75 mL via INTRAVENOUS

## 2021-02-28 MED ORDER — METHYLPREDNISOLONE SODIUM SUCC 125 MG IJ SOLR
80.0000 mg | Freq: Two times a day (BID) | INTRAMUSCULAR | Status: DC
  Administered 2021-02-28 – 2021-03-03 (×7): 80 mg via INTRAVENOUS
  Filled 2021-02-28 (×7): qty 2

## 2021-02-28 MED ORDER — LORAZEPAM 0.5 MG PO TABS
0.5000 mg | ORAL_TABLET | Freq: Every evening | ORAL | Status: DC | PRN
  Administered 2021-02-28 – 2021-03-01 (×2): 0.5 mg via ORAL
  Filled 2021-02-28 (×2): qty 1

## 2021-02-28 MED ORDER — BUDESONIDE 0.5 MG/2ML IN SUSP
0.5000 mg | Freq: Two times a day (BID) | RESPIRATORY_TRACT | Status: DC
  Administered 2021-02-28 – 2021-03-08 (×16): 0.5 mg via RESPIRATORY_TRACT
  Filled 2021-02-28 (×16): qty 2

## 2021-02-28 MED ORDER — PREDNISONE 20 MG PO TABS: 40.0000 mg | ORAL_TABLET | Freq: Every day | ORAL | Status: DC

## 2021-02-28 MED ORDER — FUROSEMIDE 10 MG/ML IJ SOLN
40.0000 mg | Freq: Every day | INTRAMUSCULAR | Status: DC
Start: 2021-02-28 — End: 2021-03-02
  Administered 2021-02-28 – 2021-03-02 (×3): 40 mg via INTRAVENOUS
  Filled 2021-02-28 (×3): qty 4

## 2021-02-28 MED ORDER — DABIGATRAN ETEXILATE MESYLATE 150 MG PO CAPS
150.0000 mg | ORAL_CAPSULE | Freq: Two times a day (BID) | ORAL | Status: DC
  Administered 2021-02-28 – 2021-03-09 (×20): 150 mg via ORAL
  Filled 2021-02-28 (×24): qty 1

## 2021-02-28 MED ORDER — SODIUM CHLORIDE 0.9 % IV SOLN
1.0000 g | INTRAVENOUS | Status: DC
  Administered 2021-02-28 – 2021-03-06 (×7): 1 g via INTRAVENOUS
  Filled 2021-02-28 (×7): qty 10

## 2021-02-28 MED ORDER — ONDANSETRON HCL 4 MG/2ML IJ SOLN
4.0000 mg | Freq: Four times a day (QID) | INTRAMUSCULAR | Status: DC | PRN
  Administered 2021-02-28 – 2021-03-03 (×2): 4 mg via INTRAVENOUS
  Filled 2021-02-28 (×2): qty 2

## 2021-02-28 MED ORDER — METOPROLOL TARTRATE 50 MG PO TABS
50.0000 mg | ORAL_TABLET | Freq: Two times a day (BID) | ORAL | Status: DC
  Administered 2021-02-28 (×2): 50 mg via ORAL
  Filled 2021-02-28: qty 1

## 2021-02-28 MED ORDER — METHYLPREDNISOLONE SODIUM SUCC 125 MG IJ SOLR: 125.0000 mg | Freq: Every day | INTRAMUSCULAR | Status: DC

## 2021-02-28 MED ORDER — ACETAMINOPHEN 650 MG RE SUPP: 650.0000 mg | Freq: Four times a day (QID) | RECTAL | Status: DC | PRN

## 2021-02-28 MED ORDER — IPRATROPIUM-ALBUTEROL 0.5-2.5 (3) MG/3ML IN SOLN
3.0000 mL | Freq: Four times a day (QID) | RESPIRATORY_TRACT | Status: DC
  Administered 2021-02-28 – 2021-03-02 (×9): 3 mL via RESPIRATORY_TRACT
  Filled 2021-02-28 (×9): qty 3

## 2021-02-28 MED ORDER — BUDESONIDE 0.5 MG/2ML IN SUSP
2.0000 mg | Freq: Four times a day (QID) | RESPIRATORY_TRACT | Status: DC
  Administered 2021-02-28: 0.5 mg via RESPIRATORY_TRACT
  Filled 2021-02-28: qty 8

## 2021-02-28 MED ORDER — PROCHLORPERAZINE EDISYLATE 10 MG/2ML IJ SOLN: 10.0000 mg | Freq: Four times a day (QID) | INTRAMUSCULAR | Status: DC | PRN

## 2021-02-28 MED ORDER — LISINOPRIL 5 MG PO TABS
2.5000 mg | ORAL_TABLET | Freq: Every day | ORAL | Status: DC
  Filled 2021-02-28: qty 1

## 2021-02-28 MED ORDER — ALBUTEROL SULFATE (2.5 MG/3ML) 0.083% IN NEBU
2.5000 mg | INHALATION_SOLUTION | RESPIRATORY_TRACT | Status: DC | PRN
  Administered 2021-03-06: 2.5 mg via RESPIRATORY_TRACT
  Filled 2021-02-28: qty 3

## 2021-02-28 NOTE — Progress Notes (Signed)
Patient seen and examined. Admitted after midnight secondary to productive cough, worsening SOB, wheezing and orthopnea. Work up demonstrating acute resp failure with hypoxia in the setting of COPD exacerbation, Pneumonia and acute on on chronic right heart failure. Reporting some improvement after steroids, lasix and oxygen supplementation started. Please refer to H&P as written by Dr. Clearence Ped for further info/details on admission.  Plan: -continue to follow daily weight and strict I's and O's -follow low sodium diet -continue IV lasix, steroids and nebulizer management  -continue current antibiotics and supportive care.                                                                                        -continue to wean off oxygen as tolerated and follow clinical response.   Barton Dubois MD [phone]

## 2021-02-28 NOTE — H&P (Signed)
TRH H&P    Patient Demographics:    Kathleen Wright, is a 55 y.o. female  MRN: [id number]  DOB - 07-12-1965  Admit Date - 02/27/2021  Referring MD/NP/PA: Ileene Patrick  Outpatient Primary MD for the patient is Cory Munch, PA-C  Patient coming from: Home  Chief complaint- Dyspnea   HPI:    Kathleen Wright  is a 55 y.o. female, with history of asthma, COPD, hypertrophic cardiomyopathy, paroxysmal atrial fibrillation, and more presents the ED with a chief complaint of dyspnea.  Is been ongoing for 1 week.  Its been getting worse over the week.  She reports that it is worse with exertion, she also has orthopnea.  She reports that she has had orthopnea since she had open heart surgery.  That part is not new.  She has peripheral edema with left greater than right.  That has been going on for longer than 1 week.  She reports a productive cough with green/white/brown [place].  This is a change in color as her normal sputum is clear or white.  She denies any fevers.  She has tried albuterol and ipratropium 2 times today without relief prior to presenting to the ED.  She reports a chest tightness that is diffuse across her chest.  That is been going on for 2 days.  It is also worse with exertion.  She does not require oxygen at baseline, but she is requiring oxygen today.  Patient reports that she has no other complaints at this time.  Patient does not smoke, recently quit using Chantix, does not drink alcohol, does not use illicit drugs.  She is vaccinated for COVID.  Patient is full code.  In the ED Patient is afebrile, normotensive, satting at 99% on 4 L nasal cannula She has a leukocytosis of 17.3-she was given Solu-Medrol in route to the ED Thrombocytosis that is likely acute phase reactant of 468 Slight hyponatremia at 131, otherwise chemistry panel is unremarkable Albumin is 3.3 Respiratory panel is negative EKG shows a  heart rate of 68, QTc 463, sinus rhythm, some nonspecific ST changes that were also present on the September 06, 2020 EKG Patient was started on Rocephin and Zithromax to cover for possible pneumonia CT angio was performed because of the hypoxia and left lower extremity asymmetric edema, that showed no evidence for pulmonary embolus.  Showed cardiac enlargement with evidence of right heart failure.  Bronchial wall thickening and peribronchial nodular airspace infiltrates likely representing multifocal pneumonia.    Review of systems:    In addition to the HPI above,  No Fever-chills, No Headache, No changes with Vision or hearing, No problems swallowing food or Liquids, No Abdominal pain, No Nausea or Vomiting, bowel movements are regular, No Blood in stool or Urine, No dysuria, No new skin rashes or bruises, No new joints pains-aches,  No new weakness, tingling, numbness in any extremity, No polyuria, polydypsia or polyphagia, No significant Mental Stressors.  All other systems reviewed and are negative.    Past History of the following :  Past Medical History:  Diagnosis Date   Asthma    Cervical cancer ([place])    a. treated with partial removal of cervix   COPD (chronic obstructive pulmonary disease) ([place])    Hypertrophic cardiomyopathy ([place])    a. s/p myomectomy at [facility] 08/2004   Paroxysmal atrial flutter ([facility])    a. post-op after myomectomy in 2006. s/p TEE/DCCV.   Transient atrial fibrillation ([place]) 2006   a. after myomectomy at [facility] in 08/2004 - was on amiodarone but developed elevated LFTs felt possibly secondary to amiodarone       Past Surgical History:  Procedure Laterality Date   CARDIOVERSION N/A 08/12/2015   Procedure: CARDIOVERSION;  Surgeon: Satira Sark, MD;  Location[facility] ENDO [facility];  Service: Cardiovascular;  Laterality: N/A;   CARDIOVERSION N/A 08/24/2020   Procedure: CARDIOVERSION;  Surgeon: Jerline Pain, MD;  Location: [facility] ENDOSCOPY;  Service:  Cardiovascular;  Laterality: N/A;   Septal myomectomy  2006   [facility]   TEE WITHOUT CARDIOVERSION N/A 03/29/2016   Procedure: TRANSESOPHAGEAL ECHOCARDIOGRAM (TEE);  Surgeon: Lelon Perla, MD;  Location: [facility] ENDOSCOPY;  Service: Cardiovascular;  Laterality: N/A;   TEE WITHOUT CARDIOVERSION N/A 06/23/2016   Procedure: TRANSESOPHAGEAL ECHOCARDIOGRAM (TEE);  Surgeon: Thayer Headings, MD;  Location: [facility] ENDOSCOPY;  Service: Cardiovascular;  Laterality: N/A;   TUBAL LIGATION        Social History:      Social History   Tobacco Use   Smoking status: Former    Packs/day: 0.50    Types: Cigarettes    Quit date: 12/11/2020    Years since quitting: 0.2   Smokeless tobacco: Never   Tobacco comments:    6-8 cigarettes daily  Substance Use Topics   Alcohol use: No    Alcohol/week: 0.0 standard drinks       Family History :     Family History  Problem Relation Age of Onset   Heart failure Father    Diabetes Other    Lung disease Other       Home Medications:   Prior to Admission medications   Medication Sig Start Date End Date Taking? Authorizing Provider  albuterol (PROVENTIL) (2.5 MG/3ML) 0.083% nebulizer solution Take 2.5 mg by nebulization every 6 (six) hours as needed for wheezing or shortness of breath.   Yes [provider]  aspirin EC 81 MG tablet Take 81 mg by mouth daily.   Yes [provider]  azithromycin (ZITHROMAX) 250 MG tablet Take 250 mg by mouth daily. Starting 9.14.22 02/24/21  Yes [provider]  dabigatran (PRADAXA) 150 MG CAPS capsule Take 1 capsule (150 mg total) by mouth 2 (two) times daily. 06/27/18  Yes [name], NP  diltiazem (CARDIZEM CD) 180 MG 24 hr capsule Take 180 mg by mouth daily. 02/02/21  Yes [provider]  diltiazem (CARDIZEM) 30 MG tablet TAKE (1) TABLET BY MOUTH EVERY FOUR HOURS AS NEEDED FOR AFIB HEART RATE OVER 100. Patient taking differently: Take 30 mg by mouth every 4 (four) hours as needed (AFIB  HEART RATE OVER 100). 10/26/18  Yes [name], NP  furosemide (LASIX) 20 MG tablet Take 20 mg by mouth daily as needed for edema.    Yes [provider]  HYDROcodone-acetaminophen (NORCO) 10-325 MG tablet Take 1 tablet by mouth every 6 (six) hours as needed for moderate pain.  08/22/19  Yes [provider]  ibuprofen (ADVIL,MOTRIN) 800 MG tablet Take 800-1,600 mg by mouth 3 (three) times  daily as needed for moderate pain.  11/02/15  Yes [provider]  ipratropium (ATROVENT) 0.02 % nebulizer solution Take 0.25 mg by nebulization every 4 (four) hours as needed for wheezing or shortness of breath. 08/04/20  Yes [provider]  metoprolol tartrate (LOPRESSOR) 50 MG tablet Take 50 mg by mouth 2 (two) times daily.   Yes [provider]  omeprazole (PRILOSEC) 40 MG capsule Take 40 mg by mouth daily. 02/11/21  Yes [provider]  potassium chloride (KLOR-CON) 10 MEQ tablet Take 10 mEq by mouth daily as needed (when taking lasix).   Yes [provider]  PROAIR HFA 108 (90 Base) MCG/ACT inhaler Inhale 2 puffs into the lungs every 6 (six) hours as needed for wheezing or shortness of breath.  05/27/15  Yes [provider]  triamcinolone (KENALOG) 0.1 % Apply 1 application topically daily as needed (Blister). 08/04/20  Yes [provider]  varenicline (CHANTIX) 1 MG tablet Take 1 mg by mouth daily. 02/26/21  Yes [provider]     Allergies:     Allergies  Allergen Reactions   Amiodarone Other (See Comments)    Liver function     Physical Exam:   Vitals  Blood pressure (!) 124/103, pulse 74, temperature 98.2 F (36.8 C), temperature source Oral, resp. rate 20, height '5\' 1"'$  (1.549 m), weight 81.6 kg, SpO2 98 %.  1.  General: Sitting in bedside chair,  no acute distress   2. Psychiatric: Alert and oriented x 3, mood and behavior normal for situation, pleasant and cooperative with exam   3.  Neurologic: Speech and language are normal, face is symmetric, moves all 4 extremities voluntarily, at baseline without acute deficits on limited exam   4. HEENMT:  Head is atraumatic, normocephalic, pupils reactive to light, neck is supple, JVD present, trachea is midline, mucous membranes are moist   5. Respiratory : Lungs are clear wheezing heard in the lower lung fields bilaterally, rhonchi, rales, no cyanosis, no increase in work of breathing or accessory muscle use   6. Cardiovascular : Heart rate normal, rhythm is regular, no murmurs, rubs or gallops, peripheral edema present, peripheral pulses palpated   7. Gastrointestinal:  Abdomen is soft, nondistended, nontender to palpation bowel sounds active, no masses or organomegaly palpated   8. Skin:  Skin is warm, dry and intact without rashes, acute lesions, or ulcers on limited exam   9.Musculoskeletal:  No acute deformities or trauma, no asymmetry in tone, no peripheral edema, chronic asymmetric peripheral edema, no tenderness to palpation in the extremities     Data Review:    CBC Recent Labs  Lab 02/27/21 2009  WBC 17.3*  HGB 9.8*  HCT 32.4*  PLT 468*  MCV 90.8  MCH 27.5  MCHC 30.2  RDW 17.3*  LYMPHSABS 1.3  MONOABS 1.9*  EOSABS 0.1  BASOSABS 0.1   ------------------------------------------------------------------------------------------------------------------  Results for orders placed or performed during the hospital encounter of 02/27/21 (from the past 48 hour(s))  Comprehensive metabolic panel     Status: Abnormal   Collection Time: 02/27/21  8:09 PM  Result Value Ref Range   Sodium 131 (L) 135 - 145 mmol/L   Potassium 4.1 3.5 - 5.1 mmol/L   Chloride 99 98 - 111 mmol/L   CO2 27 22 - 32 mmol/L   Glucose, Bld 123 (H) 70 - 99 mg/dL    Comment: Glucose reference range applies only to samples taken after fasting for at least 8 hours.  BUN 15 6 - 20 mg/dL   Creatinine, Ser 0.62 0.44 - 1.00 mg/dL    Calcium 8.1 (L) 8.9 - 10.3 mg/dL   Total Protein 6.8 6.5 - 8.1 g/dL   Albumin 3.3 (L) 3.5 - 5.0 g/dL   AST 14 (L) 15 - 41 U/L   ALT 16 0 - 44 U/L   Alkaline Phosphatase 120 38 - 126 U/L   Total Bilirubin 1.0 0.3 - 1.2 mg/dL   GFR, Estimated >60 >60 mL/min    Comment: (NOTE) Calculated using the CKD-EPI Creatinine Equation (2021)    Anion gap 5 5 - 15    Comment: Performed at [facility], [address] [place], [place] [address]  CBC with Differential     Status: Abnormal   Collection Time: 02/27/21  8:09 PM  Result Value Ref Range   WBC 17.3 (H) 4.0 - 10.5 K/uL   RBC 3.57 (L) 3.87 - 5.11 MIL/uL   Hemoglobin 9.8 (L) 12.0 - 15.0 g/dL   HCT 32.4 (L) 36.0 - 46.0 %   MCV 90.8 80.0 - 100.0 fL   MCH 27.5 26.0 - 34.0 pg   MCHC 30.2 30.0 - 36.0 g/dL   RDW 17.3 (H) 11.5 - 15.5 %   Platelets 468 (H) 150 - 400 K/uL   nRBC 0.0 0.0 - 0.2 %   Neutrophils Relative % 81 %   Neutro Abs 13.7 (H) 1.7 - 7.7 K/uL   Lymphocytes Relative 7 %   Lymphs Abs 1.3 0.7 - 4.0 K/uL   Monocytes Relative 11 %   Monocytes Absolute 1.9 (H) 0.1 - 1.0 K/uL   Eosinophils Relative 0 %   Eosinophils Absolute 0.1 0.0 - 0.5 K/uL   Basophils Relative 0 %   Basophils Absolute 0.1 0.0 - 0.1 K/uL   Immature Granulocytes 1 %   Abs Immature Granulocytes 0.19 (H) 0.00 - 0.07 K/uL    Comment: Performed at [facility], [address], [place], [place] [address]  Troponin I (High Sensitivity)     Status: Abnormal   Collection Time: 02/27/21  8:09 PM  Result Value Ref Range   Troponin I (High Sensitivity) 38 (H) <18 ng/L    Comment: (NOTE) Elevated high sensitivity troponin I (hsTnI) values and significant  changes across serial measurements may suggest ACS but many other  chronic and acute conditions are known to elevate hsTnI results.  Refer to the "Links" section for chest pain algorithms and additional  guidance. Performed at [facility], [address] [place], [place] [address]   Brain natriuretic peptide      Status: Abnormal   Collection Time: 02/27/21  8:09 PM  Result Value Ref Range   B Natriuretic Peptide 610.0 (H) 0.0 - 100.0 pg/mL    Comment: Performed at [facility], [address] [place],  [address]  Resp Panel by RT-PCR (Flu A&B, Covid) Nasopharyngeal Swab     Status: None   Collection Time: 02/27/21  8:28 PM   Specimen: Nasopharyngeal Swab; Nasopharyngeal(NP) swabs in vial transport medium  Result Value Ref Range   SARS Coronavirus 2 by RT PCR NEGATIVE NEGATIVE    Comment: (NOTE) SARS-CoV-2 target nucleic acids are NOT DETECTED.  The SARS-CoV-2 RNA is generally detectable in upper respiratory specimens during the acute phase of infection. The lowest concentration of SARS-CoV-2 viral copies this assay can detect is 138 copies/mL. A negative result does not preclude SARS-Cov-2 infection and should not be used as the sole basis for treatment or other  patient management decisions. A negative result may occur with  improper specimen collection/handling, submission of specimen other than nasopharyngeal swab, presence of viral mutation(s) within the areas targeted by this assay, and inadequate number of viral copies(<138 copies/mL). A negative result must be combined with clinical observations, patient history, and epidemiological information. The expected result is Negative.  Fact Sheet for Patients:  EntrepreneurPulse.com.au  Fact Sheet for Healthcare Providers:  IncredibleEmployment.be  This test is no t yet approved or cleared by the [place] FDA and  has been authorized for detection and/or diagnosis of SARS-CoV-2 by FDA under an Emergency Use Authorization (EUA). This EUA will remain  in effect (meaning this test can be used) for the duration of the COVID-19 declaration under Section 564(b)(1) of the Act, 21 U.S.C.section 360bbb-3(b)(1), unless the authorization is terminated  or revoked sooner.       Influenza A by PCR  NEGATIVE NEGATIVE   Influenza B by PCR NEGATIVE NEGATIVE    Comment: (NOTE) The Xpert Xpress SARS-CoV-2/FLU/RSV plus assay is intended as an aid in the diagnosis of influenza from Nasopharyngeal swab specimens and should not be used as a sole basis for treatment. Nasal washings and aspirates are unacceptable for Xpert Xpress SARS-CoV-2/FLU/RSV testing.  Fact Sheet for Patients: EntrepreneurPulse.com.au  Fact Sheet for Healthcare Providers: IncredibleEmployment.be  This test is not yet approved or cleared by the [place] FDA and has been authorized for detection and/or diagnosis of SARS-CoV-2 by FDA under an Emergency Use Authorization (EUA). This EUA will remain in effect (meaning this test can be used) for the duration of the COVID-19 declaration under Section 564(b)(1) of the Act, 21 U.S.C. section 360bbb-3(b)(1), unless the authorization is terminated or revoked.  Performed at [facility], [address] [place], [place] [address]   Troponin I (High Sensitivity)     Status: Abnormal   Collection Time: 02/27/21  9:52 PM  Result Value Ref Range   Troponin I (High Sensitivity) 32 (H) <18 ng/L    Comment: (NOTE) Elevated high sensitivity troponin I (hsTnI) values and significant  changes across serial measurements may suggest ACS but many other  chronic and acute conditions are known to elevate hsTnI results.  Refer to the "Links" section for chest pain algorithms and additional  guidance. Performed at [facility], [address] [place], [place] [address]   Blood gas, venous (at [facility] and [facility], not at [facility])     Status: Abnormal   Collection Time: 02/28/21  1:24 AM  Result Value Ref Range   FIO2 36.00    pH, Ven 7.254 7.250 - 7.430   pCO2, Ven 63.1 (H) 44.0 - 60.0 mmHg   pO2, Ven <31.0 (LL) 32.0 - 45.0 mmHg    Comment: CRITICAL RESULT CALLED TO, READ BACK BY AND VERIFIED WITH: FERRELL,S @ 0150 ON 02/28/21 BY JUW    Bicarbonate 22.6  20.0 - 28.0 mmol/L   Acid-Base Excess 0.6 0.0 - 2.0 mmol/L   O2 Saturation 40.6 %   Patient temperature 36.8     Comment: Performed at [facility], [address] [place], [place] [address]  Glucose, capillary     Status: Abnormal   Collection Time: 02/28/21  3:28 AM  Result Value Ref Range   Glucose-Capillary 201 (H) 70 - 99 mg/dL    Comment: Glucose reference range applies only to samples taken after fasting for at least 8 hours.    Chemistries  Recent Labs  Lab 02/27/21 2009  NA 131*  K 4.1  CL 99  CO2 27  GLUCOSE 123*  BUN 15  CREATININE 0.62  CALCIUM 8.1*  AST 14*  ALT 16  ALKPHOS 120  BILITOT 1.0   ------------------------------------------------------------------------------------------------------------------  ------------------------------------------------------------------------------------------------------------------ GFR: Estimated Creatinine Clearance: 76 mL/min (by C-G formula based on SCr of 0.62 mg/dL). Liver Function Tests: Recent Labs  Lab 02/27/21 2009  AST 14*  ALT 16  ALKPHOS 120  BILITOT 1.0  PROT 6.8  ALBUMIN 3.3*   No results for input(s): LIPASE, AMYLASE in the last 168 hours. No results for input(s): AMMONIA in the last 168 hours. Coagulation Profile: No results for input(s): INR, PROTIME in the last 168 hours. Cardiac Enzymes: No results for input(s): CKTOTAL, CKMB, CKMBINDEX, TROPONINI in the last 168 hours. BNP (last 3 results) No results for input(s): PROBNP in the last 8760 hours. HbA1C: No results for input(s): HGBA1C in the last 72 hours. CBG: Recent Labs  Lab 02/28/21 0328  GLUCAP 201*   Lipid Profile: No results for input(s): CHOL, HDL, LDLCALC, TRIG, CHOLHDL, LDLDIRECT in the last 72 hours. Thyroid Function Tests: No results for input(s): TSH, T4TOTAL, FREET4, T3FREE, THYROIDAB in the last 72 hours. Anemia Panel: No results for input(s): VITAMINB12, FOLATE, FERRITIN, TIBC, IRON, RETICCTPCT in the last 72  hours.  --------------------------------------------------------------------------------------------------------------- Urine analysis:    Component Value Date/Time   COLORURINE STRAW (A) 08/10/2015 1930   APPEARANCEUR CLEAR 08/10/2015 1930   LABSPEC 1.015 08/10/2015 1930   PHURINE 5.5 08/10/2015 1930   GLUCOSEU NEGATIVE 08/10/2015 1930   HGBUR NEGATIVE 08/10/2015 1930   BILIRUBINUR NEGATIVE 08/10/2015 1930   KETONESUR NEGATIVE 08/10/2015 1930   PROTEINUR NEGATIVE 08/10/2015 1930   UROBILINOGEN 0.2 05/13/2009 1307   NITRITE NEGATIVE 08/10/2015 1930   LEUKOCYTESUR TRACE (A) 08/10/2015 1930      Imaging Results:    CT Angio Chest PE W and/or Wo Contrast  Result Date: 02/28/2021 CLINICAL DATA:  Pulmonary embolus suspected with high probability. Shortness of breath for 1 week. Cough. EXAM: CT ANGIOGRAPHY CHEST WITH CONTRAST TECHNIQUE: Multidetector CT imaging of the chest was performed using the standard protocol during bolus administration of intravenous contrast. Multiplanar CT image reconstructions and MIPs were obtained to evaluate the vascular anatomy. CONTRAST:  59m OMNIPAQUE IOHEXOL 350 MG/ML SOLN COMPARISON:  Chest radiograph 02/27/2021 FINDINGS: Cardiovascular: Good opacification of the central and segmental pulmonary arteries. No focal filling defects are demonstrated. No evidence of significant pulmonary embolus. Cardiac enlargement with particular prominence of the right heart and left atrium. No pericardial effusions. Coronary artery and mild aortic calcification. Normal caliber thoracic aorta. No evidence of aortic dissection. Great vessel origins are patent. Mediastinum/Nodes: Thyroid gland is unremarkable. Moderate prominence of mediastinal lymph nodes without pathologic enlargement. Esophagus is decompressed. Lungs/Pleura: Motion artifact limits evaluation. There is bronchial wall thickening with peribronchial nodular airspace infiltrates most prominent in the perihilar regions  bilaterally. Changes are most likely to represent multifocal pneumonia, bronchopneumonia, or other airways disease. No pleural effusions. No pneumothorax. Upper Abdomen: Reflux of contrast material into the hepatic veins may indicate right heart failure. Musculoskeletal: Postoperative changes in the mediastinum with sternotomy wires present. Degenerative changes in the spine. Review of the MIP images confirms the above findings. IMPRESSION: 1. No evidence of significant pulmonary embolus. 2. Cardiac enlargement with evidence of right heart failure. 3. Bronchial wall thickening with peribronchial nodular airspace infiltrates likely representing multifocal pneumonia or airways disease. Electronically Signed   By: WLucienne CapersM.D.   On: 02/28/2021 00:39   [facility]  Result  Date: 02/27/2021 CLINICAL DATA:  Cough. Shortness of breath for 1 week. Former smoker. EXAM: PORTABLE CHEST 1 VIEW COMPARISON:  12/27/2015 FINDINGS: Postoperative changes in the mediastinum. Mild cardiac enlargement. No airspace disease or consolidation in the lungs. No pleural effusions. No pneumothorax. Mediastinal contours appear intact. Degenerative changes in the shoulders. IMPRESSION: Mild cardiac enlargement.  No evidence of active pulmonary disease. Electronically Signed   By: Lucienne Capers M.D.   On: 02/27/2021 20:23      Assessment & Plan:    Active Problems:   History of atrial fibrillation   Elevated troponin   COPD with acute exacerbation (HCC)   Protein-calorie malnutrition, mild (HCC)   Respiratory failure with hypoxia (HCC)   Acute respiratory failure with hypoxia No oxygen requirement at baseline Heart failure versus COPD versus pneumonia Requiring 4 L nasal cannula today VBG shows a pH of 7.2, PCO2 of 63, PO2 less than 31 Patient also has evidence of cardiac enlargement and right heart failure on CT Pneumonia on CT And wheezing consistent with COPD Continue treatments as  below COPD Continue Solu-Medrol, inhalers VBG showed PCO2 of 61, but pH was normal for a venous blood gas Patient mentation has been fine, no BiPAP started at this time Will culture expectorated sputum Likely triggered by pneumonia 3.     Community Acquired Pneumonia As evidenced by CT chest, leukocytosis,  cough,  dyspnea  Most likely causative organism is strep pneumonia versus atypical pathogen Pathogen directed therapy with Rocephin covering with Zithromax in the setting of multifocal distribution  sputum cultures 4.    Acute congestive heart failure exacerbation Major Criteria  orthopnea +JVD, Cardiomegaly Minor criteria BL leg edema, dyspnea on exertion IV diuresis initiated with Lasix 40 mg, continue with Lasix 40 mg Last Echo in 2020 and showed an ejection fraction of 60 to 65%, with normal diastolic parameters, update echo Continue metoprolol, start an ACE Elevated troponin Downtrending from 38, to 32 Likely demand ischemia in the setting of hypoxia requiring 4 L Monitor on telemetry Protein calorie malnutrition Albumin 3.3 Encourage nutrient dense food choices History of A. Fib Continue diltiazem and Pradaxa   DVT Prophylaxis-   Pradaxa- SCDs   AM Labs Ordered, also please review Full Orders  Family Communication: No family at bedside  Code Status: Full  Admission status: Inpatient :The appropriate admission status for this patient is INPATIENT. Inpatient status is judged to be reasonable and necessary in order to provide the required intensity of service to ensure the patient's safety. The patient's presenting symptoms, physical exam findings, and initial radiographic and laboratory data in the context of their chronic comorbidities is felt to place them at high risk for further clinical deterioration. Furthermore, it is not anticipated that the patient will be medically stable for discharge from the hospital within 2 midnights of admission. The following factors  support the admission status of inpatient.     The patient's presenting symptoms include dyspnea. The worrisome physical exam findings include peripheral edema, hypoxia. The initial radiographic and laboratory data are worrisome because of cardiomegaly, right heart failure on CT. The chronic co-morbidities include A. fib, asthma, COPD.       * I certify that at the point of admission it is my clinical judgment that the patient will require inpatient hospital care spanning beyond 2 midnights from the point of admission due to high intensity of service, high risk for further deterioration and high frequency of surveillance required.*  Time spent in minutes : 65  [place]

## 2021-02-28 NOTE — Progress Notes (Signed)
*  PRELIMINARY RESULTS* Echocardiogram 2D Echocardiogram has been performed.  Kathleen Wright 02/28/2021, 10:45 AM

## 2021-03-01 DIAGNOSIS — J189 Pneumonia, unspecified organism: Principal | ICD-10-CM

## 2021-03-01 DIAGNOSIS — I5033 Acute on chronic diastolic (congestive) heart failure: Secondary | ICD-10-CM

## 2021-03-01 LAB — CBC
HCT: 34 % — ABNORMAL LOW (ref 36.0–46.0)
Hemoglobin: 10.2 g/dL — ABNORMAL LOW (ref 12.0–15.0)
MCH: 28.2 pg (ref 26.0–34.0)
MCHC: 30 g/dL (ref 30.0–36.0)
MCV: 93.9 fL (ref 80.0–100.0)
Platelets: 492 10*3/uL — ABNORMAL HIGH (ref 150–400)
RBC: 3.62 MIL/uL — ABNORMAL LOW (ref 3.87–5.11)
RDW: 17.5 % — ABNORMAL HIGH (ref 11.5–15.5)
WBC: 19.1 10*3/uL — ABNORMAL HIGH (ref 4.0–10.5)
nRBC: 0 % (ref 0.0–0.2)

## 2021-03-01 LAB — BASIC METABOLIC PANEL
Anion gap: 10 (ref 5–15)
BUN: 28 mg/dL — ABNORMAL HIGH (ref 6–20)
CO2: 24 mmol/L (ref 22–32)
Calcium: 8.5 mg/dL — ABNORMAL LOW (ref 8.9–10.3)
Chloride: 98 mmol/L (ref 98–111)
Creatinine, Ser: 1.23 mg/dL — ABNORMAL HIGH (ref 0.44–1.00)
GFR, Estimated: 52 mL/min — ABNORMAL LOW (ref >60)
Glucose, Bld: 178 mg/dL — ABNORMAL HIGH (ref 70–99)
Potassium: 5.2 mmol/L — ABNORMAL HIGH (ref 3.5–5.1)
Sodium: 132 mmol/L — ABNORMAL LOW (ref 135–145)

## 2021-03-01 MED ORDER — REVEFENACIN 175 MCG/3ML IN SOLN
175.0000 ug | Freq: Every day | RESPIRATORY_TRACT | Status: DC
  Administered 2021-03-02 – 2021-03-09 (×8): 175 ug via RESPIRATORY_TRACT
  Filled 2021-03-01 (×8): qty 3

## 2021-03-01 NOTE — Progress Notes (Signed)
PROGRESS NOTE    Kathleen Wright  [id number] DOB: 05/31/1966 DOA: 02/27/2021 PCP: Cory Munch, PA-C    Chief Complaint  Patient presents with   Shortness of Breath    Brief admission narrative:  As per H&P written by Dr.Zierle-Ghosh on 02/28/21 Kathleen Wright  is a 55 y.o. female, with history of asthma, COPD, hypertrophic cardiomyopathy, paroxysmal atrial fibrillation, and more presents the ED with a chief complaint of dyspnea.  Is been ongoing for 1 week.  Its been getting worse over the week.  She reports that it is worse with exertion, she also has orthopnea.  She reports that she has had orthopnea since she had open heart surgery.  That part is not new.  She has peripheral edema with left greater than right.  That has been going on for longer than 1 week.  She reports a productive cough with green/white/brown [place].  This is a change in color as her normal sputum is clear or white.  She denies any fevers.  She has tried albuterol and ipratropium 2 times today without relief prior to presenting to the ED.  She reports a chest tightness that is diffuse across her chest.  That is been going on for 2 days.  It is also worse with exertion.  She does not require oxygen at baseline, but she is requiring oxygen today.  Patient reports that she has no other complaints at this time.   Patient does not smoke, recently quit using Chantix, does not drink alcohol, does not use illicit drugs.  She is vaccinated for COVID.  Patient is full code.   In the ED Patient is afebrile, normotensive, satting at 99% on 4 L nasal cannula She has a leukocytosis of 17.3-she was given Solu-Medrol in route to the ED Thrombocytosis that is likely acute phase reactant of 468 Slight hyponatremia at 131, otherwise chemistry panel is unremarkable Albumin is 3.3 Respiratory panel is negative EKG shows a heart rate of 68, QTc 463, sinus rhythm, some nonspecific ST changes that were also present on the September 06, 2020  EKG Patient was started on Rocephin and Zithromax to cover for possible pneumonia CT angio was performed because of the hypoxia and left lower extremity asymmetric edema, that showed no evidence for pulmonary embolus.  Showed cardiac enlargement with evidence of right heart failure.  Bronchial wall thickening and peribronchial nodular airspace infiltrates likely representing multifocal pneumonia.   Assessment & Plan: 1-acute respiratory failure with hypoxia secondary to acute on chronic congestive heart failure (diastolic and right heart sided failure), bronchiectasis/pneumonia and COPD exacerbation. -Patient complaining of lower extremity edema, orthopnea, dyspnea on exertion and increased wheezing along with shortness of breath and the need of oxygen supplementation due to new development of hypoxia. -CT angiogram of the chest demonstrated no pulmonary embolism but had bronchial wall thickening and peribronchial nodular infiltrates representing multifocal pneumonia. -2D echo with positive grade 3 diastolic dysfunction, ejection fraction 65 to 70%, no wall motion abnormalities and the presence of low normal right ventricular systolic function. -Will continue treatment with IV diuresis, low-sodium diet, daily weights and strict I's and O's. -Continue also nebulizer management, IV steroids, mucolytic/antitussive regimen; and IV antibiotics (Rocephin and Zithromax). -Wean off oxygen supplementation as tolerated; continue the use of flutter valve and follow clinical response.  2-History of atrial fibrillation -Rate controlled -Continue the use of Cardizem -Continue the use of Pradaxa for secondary prevention.  3-Elevated troponin -In the setting of demand ischemia secondary to ongoing congestive heart  failure exacerbation and pulmonary process. -Patient denies chest pain -No ischemic changes on EKG at time of admission or telemetry evaluation.  4-class II obesity -Body mass index is 36.91  kg/m. -Low calorie diet, portion control and increase physical activity discussed with patient.  5-gastroesophageal reflux disease -Continue PPI.  DVT prophylaxis: Pradaxa Code Status: Full Code. Family Communication: Daughter at bedside. Disposition:   Status is: Inpatient  Remains inpatient appropriate because:IV treatments appropriate due to intensity of illness or inability to take PO  Dispo: The patient is from: Home              Anticipated d/c is to: Home              Patient currently is not medically stable to d/c.   Difficult to place patient No       Consultants:  None  Procedures:  See below for x-ray reports. 2D echo: 1. Left ventricular ejection fraction, by estimation, is 65 to 70%. The  left ventricle has normal function. The left ventricle has no regional  wall motion abnormalities. There is moderate asymmetric left ventricular  hypertrophy of the septal segment.  Left ventricular diastolic parameters are consistent with Grade III  diastolic dysfunction (restrictive).   2. Right ventricular systolic function is low normal. The right  ventricular size is mildly enlarged. Mildly increased right ventricular  wall thickness. There is mildly elevated pulmonary artery systolic  pressure. The estimated right ventricular systolic   pressure is [id number] mmHg.   3. Left atrial size was severely dilated.   4. Right atrial size was moderately dilated.   5. Moderate pericardial effusion. The pericardial effusion is posterior  to the left ventricle.   6. The mitral valve is abnormal. Moderate mitral valve regurgitation.  Moderate mitral annular calcification.   7. Tricuspid valve regurgitation is mild to moderate.   8. The aortic valve is tricuspid. Aortic valve regurgitation is not  visualized.   9. The inferior vena cava is normal in size with greater than 50%  respiratory variability, suggesting right atrial pressure of 3 mmHg.   Antimicrobials:   Zithromax   Subjective: Afebrile, no chest pain, no nausea, no vomiting.  Patient demonstrating difficulty speaking in full sentences, positive orthopnea and short winded sensation with minimal exertion.  Using 4 L Account supplementation.  Objective: Vitals:   03/01/21 0906 03/01/21 1136 03/01/21 1350 03/01/21 1442  BP:   140/76   Pulse:   (!) 54   Resp:   18   Temp:   98.2 F (36.8 C)   TempSrc:      SpO2: 98% 97% 98% 97%  Weight:      Height:        Intake/Output Summary (Last 24 hours) at 03/01/2021 1723 Last data filed at 03/01/2021 1500 Gross per 24 hour  Intake 1053.03 ml  Output --  Net 1053.03 ml   Filed Weights   02/27/21 1754 03/01/21 0500  Weight: 81.6 kg 88.6 kg    Examination:  General exam: Afebrile, no chest pain, no nausea or vomiting.  Reporting shortness of breath with exertion, positive orthopnea and difficulty speaking in full sentences.  Patient is using 4 L nasal cannula supplementation to maintain O2 sats above 92%. Respiratory system: Positive crackles at the bases; diffuse expiratory wheezing and positive rhonchi bilaterally.  No using accessory muscles. Cardiovascular system: Rate controlled.  No JVD, murmurs, rubs, gallops or clicks. No pedal edema. Gastrointestinal system: Abdomen is obese, nondistended, soft and nontender. No  organomegaly or masses felt. Normal bowel sounds heard. Central nervous system: Alert and oriented. No focal neurological deficits. Extremities: 2+ edema bilaterally; no cyanosis or clubbing. Skin: No rashes, lesions or ulcers Psychiatry: Judgement and insight appear normal. Mood & affect appropriate.     Data Reviewed: I have personally reviewed following labs and imaging studies  CBC: Recent Labs  Lab 02/27/21 2009 02/28/21 0445 03/01/21 0537  WBC 17.3* 16.6* 19.1*  NEUTROABS 13.7*  --   --   HGB 9.8* 9.2* 10.2*  HCT 32.4* 30.8* 34.0*  MCV 90.8 93.3 93.9  PLT 468* 464* 492*    Basic Metabolic  Panel: Recent Labs  Lab 02/27/21 2009 02/28/21 0445 03/01/21 0537  NA 131* 133* 132*  K 4.1 4.8 5.2*  CL 99 101 98  CO2 '27 25 24  '$ GLUCOSE 123* 200* 178*  BUN 15 15 28*  CREATININE 0.62 0.64 1.23*  CALCIUM 8.1* 8.4* 8.5*  MG  --  1.9  --     GFR: Estimated Creatinine Clearance: 51.7 mL/min (A) (by C-G formula based on SCr of 1.23 mg/dL (H)).  Liver Function Tests: Recent Labs  Lab 02/27/21 2009 02/28/21 0445  AST 14* 16  ALT 16 16  ALKPHOS 120 108  BILITOT 1.0 0.9  PROT 6.8 6.6  ALBUMIN 3.3* 3.2*    CBG: Recent Labs  Lab 02/28/21 0328 02/28/21 1627  GLUCAP 201* 173*    Recent Results (from the past 240 hour(s))  Resp Panel by RT-PCR (Flu A&B, Covid) Nasopharyngeal Swab     Status: None   Collection Time: 02/27/21  8:28 PM   Specimen: Nasopharyngeal Swab; Nasopharyngeal(NP) swabs in vial transport medium  Result Value Ref Range Status   SARS Coronavirus 2 by RT PCR NEGATIVE NEGATIVE Final    Comment: (NOTE) SARS-CoV-2 target nucleic acids are NOT DETECTED.  The SARS-CoV-2 RNA is generally detectable in upper respiratory specimens during the acute phase of infection. The lowest concentration of SARS-CoV-2 viral copies this assay can detect is 138 copies/mL. A negative result does not preclude SARS-Cov-2 infection and should not be used as the sole basis for treatment or other patient management decisions. A negative result may occur with  improper specimen collection/handling, submission of specimen other than nasopharyngeal swab, presence of viral mutation(s) within the areas targeted by this assay, and inadequate number of viral copies(<138 copies/mL). A negative result must be combined with clinical observations, patient history, and epidemiological information. The expected result is Negative.  Fact Sheet for Patients:  EntrepreneurPulse.com.au  Fact Sheet for Healthcare Providers:  IncredibleEmployment.be  This  test is no t yet approved or cleared by the [place] FDA and  has been authorized for detection and/or diagnosis of SARS-CoV-2 by FDA under an Emergency Use Authorization (EUA). This EUA will remain  in effect (meaning this test can be used) for the duration of the COVID-19 declaration under Section 564(b)(1) of the Act, 21 U.S.C.section 360bbb-3(b)(1), unless the authorization is terminated  or revoked sooner.       Influenza A by PCR NEGATIVE NEGATIVE Final   Influenza B by PCR NEGATIVE NEGATIVE Final    Comment: (NOTE) The Xpert Xpress SARS-CoV-2/FLU/RSV plus assay is intended as an aid in the diagnosis of influenza from Nasopharyngeal swab specimens and should not be used as a sole basis for treatment. Nasal washings and aspirates are unacceptable for Xpert Xpress SARS-CoV-2/FLU/RSV testing.  Fact Sheet for Patients: EntrepreneurPulse.com.au  Fact Sheet for Healthcare Providers: IncredibleEmployment.be  This test is not yet  approved or cleared by the [place] and has been authorized for detection and/or diagnosis of SARS-CoV-2 by FDA under an Emergency Use Authorization (EUA). This EUA will remain in effect (meaning this test can be used) for the duration of the COVID-19 declaration under Section 564(b)(1) of the Act, 21 U.S.C. section 360bbb-3(b)(1), unless the authorization is terminated or revoked.  Performed at [facility], [address], [place], [place] [address]   Blood culture (routine x 2)     Status: None (Preliminary result)   Collection Time: 02/28/21  1:25 AM   Specimen: Right Antecubital; Blood  Result Value Ref Range Status   Specimen Description RIGHT ANTECUBITAL  Final   Special Requests   Final    BOTTLES DRAWN AEROBIC AND ANAEROBIC Blood Culture adequate volume   Culture   Final    NO GROWTH 1 DAY Performed at [facility], [address], [place], [place] [address]    Report Status PENDING   Incomplete  Blood culture (routine x 2)     Status: None (Preliminary result)   Collection Time: 02/28/21  4:22 AM   Specimen: BLOOD LEFT HAND  Result Value Ref Range Status   Specimen Description   Final    BLOOD LEFT HAND BOTTLES DRAWN AEROBIC AND ANAEROBIC   Special Requests Blood Culture adequate volume  Final   Culture   Final    NO GROWTH 1 DAY Performed at [facility], [address] [place], [place] [address]    Report Status PENDING  Incomplete  Culture, Respiratory w Gram Stain     Status: None (Preliminary result)   Collection Time: 02/28/21  2:34 PM   Specimen: SPU  Result Value Ref Range Status   Specimen Description   Final    SPUTUM Performed at [facility], [address], [place], [place] [address]    Special Requests   Final    NONE Performed at [facility], [address], [place], [place] [address]    Gram Stain RARE WBC PRESENT, PREDOMINANTLY PMN FEW YEAST   Final   Culture   Final    CULTURE REINCUBATED FOR BETTER GROWTH Performed at [facility], [place] [address] [place], [place] [address]    Report Status PENDING  Incomplete     Radiology Studies: CT Angio Chest PE W and/or Wo Contrast  Result Date: 02/28/2021 CLINICAL DATA:  Pulmonary embolus suspected with high probability. Shortness of breath for 1 week. Cough. EXAM: CT ANGIOGRAPHY CHEST WITH CONTRAST TECHNIQUE: Multidetector CT imaging of the chest was performed using the standard protocol during bolus administration of intravenous contrast. Multiplanar CT image reconstructions and MIPs were obtained to evaluate the vascular anatomy. CONTRAST:  33m OMNIPAQUE IOHEXOL 350 MG/ML SOLN COMPARISON:  Chest radiograph 02/27/2021 FINDINGS: Cardiovascular: Good opacification of the central and segmental pulmonary arteries. No focal filling defects are demonstrated. No evidence of significant pulmonary embolus. Cardiac enlargement with particular prominence of the right heart and left atrium. No  pericardial effusions. Coronary artery and mild aortic calcification. Normal caliber thoracic aorta. No evidence of aortic dissection. Great vessel origins are patent. Mediastinum/Nodes: Thyroid gland is unremarkable. Moderate prominence of mediastinal lymph nodes without pathologic enlargement. Esophagus is decompressed. Lungs/Pleura: Motion artifact limits evaluation. There is bronchial wall thickening with peribronchial nodular airspace infiltrates most prominent in the perihilar regions bilaterally. Changes are most likely to represent multifocal pneumonia, bronchopneumonia, or other airways disease. No pleural effusions. No pneumothorax. Upper Abdomen: Reflux of contrast material into the hepatic  veins may indicate right heart failure. Musculoskeletal: Postoperative changes in the mediastinum with sternotomy wires present. Degenerative changes in the spine. Review of the MIP images confirms the above findings. IMPRESSION: 1. No evidence of significant pulmonary embolus. 2. Cardiac enlargement with evidence of right heart failure. 3. Bronchial wall thickening with peribronchial nodular airspace infiltrates likely representing multifocal pneumonia or airways disease. Electronically Signed   By: Lucienne Capers M.D.   On: 02/28/2021 00:39   DG Chest Port 1 View  Result Date: 02/27/2021 CLINICAL DATA:  Cough. Shortness of breath for 1 week. Former smoker. EXAM: PORTABLE CHEST 1 VIEW COMPARISON:  12/27/2015 FINDINGS: Postoperative changes in the mediastinum. Mild cardiac enlargement. No airspace disease or consolidation in the lungs. No pleural effusions. No pneumothorax. Mediastinal contours appear intact. Degenerative changes in the shoulders. IMPRESSION: Mild cardiac enlargement.  No evidence of active pulmonary disease. Electronically Signed   By: Lucienne Capers M.D.   On: 02/27/2021 20:23   ECHOCARDIOGRAM COMPLETE  Result Date: 02/28/2021    ECHOCARDIOGRAM REPORT   Patient Name:   DESHAE ZEMEL  Date of Exam: 02/28/2021 Medical Rec #:  [id number]       Height:       61.0 in Accession #:    [id number]      Weight:       180.0 lb Date of Birth:  1965-07-22       BSA:          1.806 m Patient Age:    64 years        BP:           102/72 mmHg Patient Gender: F               HR:           52 bpm. Exam Location:  [facility] Procedure: 2D Echo, Cardiac Doppler and Color Doppler Indications:    Congestive Heart Failure I50.9  History:        Patient has prior history of Echocardiogram examinations, most                 recent 11/30/2018. COPD and TIA; Arrythmias:Atrial Flutter and                 Atrial Fibrillation. Hypertrophic obstructive cardiomyopathy,                 History                 of ventricular septal myectomy, Cervical cancer, Respiratory                 failure with hypoxia ([place]), Tobacco abuse.  Sonographer:    Alvino Chapel RCS Referring Phys: [id number] ASIA B [place]  1. Left ventricular ejection fraction, by estimation, is 65 to 70%. The left ventricle has normal function. The left ventricle has no regional wall motion abnormalities. There is moderate asymmetric left ventricular hypertrophy of the septal segment. Left ventricular diastolic parameters are consistent with Grade III diastolic dysfunction (restrictive).  2. Right ventricular systolic function is low normal. The right ventricular size is mildly enlarged. Mildly increased right ventricular wall thickness. There is mildly elevated pulmonary artery systolic pressure. The estimated right ventricular systolic  pressure is [id number] mmHg.  3. Left atrial size was severely dilated.  4. Right atrial size was moderately dilated.  5. Moderate pericardial effusion. The pericardial effusion is posterior to the left ventricle.  6. The mitral valve is abnormal. Moderate mitral  valve regurgitation. Moderate mitral annular calcification.  7. Tricuspid valve regurgitation is mild to moderate.  8. The aortic valve is tricuspid. Aortic valve  regurgitation is not visualized.  9. The inferior vena cava is normal in size with greater than 50% respiratory variability, suggesting right atrial pressure of 3 mmHg. Comparison(s): Prior images reviewed side by side. LVEF remains normal. There is moderate LVH including septal hypertrophy related to [place]. Patient reported to be status post septal myomectomy. No obvious LVOT gradient at rest. FINDINGS  Left Ventricle: Left ventricular ejection fraction, by estimation, is 65 to 70%. The left ventricle has normal function. The left ventricle has no regional wall motion abnormalities. The left ventricular internal cavity size was normal in size. There is  moderate asymmetric left ventricular hypertrophy of the septal segment. Left ventricular diastolic parameters are consistent with Grade III diastolic dysfunction (restrictive). Right Ventricle: The right ventricular size is mildly enlarged. Mildly increased right ventricular wall thickness. Right ventricular systolic function is low normal. There is mildly elevated pulmonary artery systolic pressure. The tricuspid regurgitant velocity is 3.18 m/s, and with an assumed right atrial pressure of 3 mmHg, the estimated right ventricular systolic pressure is [id number] mmHg. Left Atrium: Left atrial size was severely dilated. Right Atrium: Right atrial size was moderately dilated. Pericardium: A moderately sized pericardial effusion is present. The pericardial effusion is posterior to the left ventricle. Thickening/calcification of pericardium present. Mitral Valve: The mitral valve is abnormal. There is mild thickening of the mitral valve leaflet(s). There is mild calcification of the mitral valve leaflet(s). Moderate mitral annular calcification. Moderate mitral valve regurgitation. Tricuspid Valve: The tricuspid valve is grossly normal. Tricuspid valve regurgitation is mild to moderate. Aortic Valve: The aortic valve is tricuspid. There is mild to moderate aortic valve annular  calcification. Aortic valve regurgitation is not visualized. Pulmonic Valve: The pulmonic valve was grossly normal. Pulmonic valve regurgitation is trivial. Aorta: The aortic root is normal in size and structure. Venous: The inferior vena cava is normal in size with greater than 50% respiratory variability, suggesting right atrial pressure of 3 mmHg. IAS/Shunts: No atrial level shunt detected by color flow Doppler.  LEFT VENTRICLE PLAX 2D LVIDd:         4.10 cm  Diastology LVIDs:         2.60 cm  LV e' medial:    4.46 cm/s LV PW:         1.40 cm  LV E/e' medial:  28.7 LV IVS:        1.70 cm  LV e' lateral:   6.31 cm/s LVOT diam:     1.90 cm  LV E/e' lateral: 20.3 LV SV:         105 LV SV Index:   58 LVOT Area:     2.84 cm  RIGHT VENTRICLE RV S prime:     10.90 cm/s TAPSE (M-mode): 2.0 cm LEFT ATRIUM              Index        RIGHT ATRIUM           Index LA diam:        4.40 cm  2.44 cm/m   RA Area:     28.90 cm LA Vol (A2C):   202.0 ml 111.85 ml/m RA Volume:   90.50 ml  50.11 ml/m LA Vol (A4C):   121.0 ml 67.00 ml/m LA Biplane Vol: 158.0 ml 87.48 ml/m  AORTIC VALVE LVOT Vmax:   158.50 cm/s  LVOT Vmean:  98.750 cm/s LVOT VTI:    0.369 m  AORTA Ao Root diam: 2.90 cm MITRAL VALVE                 TRICUSPID VALVE MV Area (PHT): 3.85 cm      TR Peak grad:   40.4 mmHg MV Decel Time: 197 msec      TR Vmax:        318.00 cm/s MR Peak grad:    79.6 mmHg MR Mean grad:    53.0 mmHg   SHUNTS MR Vmax:         446.00 cm/s Systemic VTI:  0.37 m MR Vmean:        345.0 cm/s  Systemic Diam: 1.90 cm MR PISA:         1.01 cm MR PISA Eff ROA: 9 mm MR PISA Radius:  0.40 cm MV E velocity: 128.00 cm/s MV A velocity: 50.10 cm/s MV E/A ratio:  2.55 [name] MD Electronically signed by [name] MD Signature Date/Time: 02/28/2021/11:49:37 AM    Final      Scheduled Meds:  arformoterol  15 mcg Nebulization BID   aspirin EC  81 mg Oral Daily   budesonide (PULMICORT) nebulizer solution  0.5 mg Nebulization BID    dabigatran  150 mg Oral BID   dextromethorphan-guaiFENesin  1 tablet Oral BID   diltiazem  180 mg Oral Daily   furosemide  40 mg Intravenous Daily   ipratropium-albuterol  3 mL Nebulization Q6H   methylPREDNISolone (SOLU-MEDROL) injection  80 mg Intravenous Q12H   pantoprazole  40 mg Oral Daily   revefenacin  175 mcg Nebulization Daily   Continuous Infusions:  azithromycin 500 mg (03/01/21 0102)   cefTRIAXone (ROCEPHIN)  IV 1 g (02/28/21 2317)     LOS: 1 day    Time spent: 35 minutes   Barton Dubois, MD [facility]   To contact the attending provider between 7A-7P or the covering provider during after hours 7P-7A, please log into the web site [email or web address] and access using universal [place] password for that web site. If you do not have the password, please call the hospital operator.  03/01/2021, 5:23 PM

## 2021-03-01 NOTE — Plan of Care (Signed)
  Problem: Education: Goal: Knowledge of General Education information will improve Description: Including pain rating scale, medication(s)/side effects and non-pharmacologic comfort measures Outcome: Progressing   Problem: Clinical Measurements: Goal: Will remain free from infection Outcome: Progressing   Problem: Nutrition: Goal: Adequate nutrition will be maintained Outcome: Progressing   Problem: Elimination: Goal: Will not experience complications related to bowel motility Outcome: Progressing Goal: Will not experience complications related to urinary retention Outcome: Progressing   Problem: Pain Managment: Goal: General experience of comfort will improve Outcome: Progressing   Problem: Safety: Goal: Ability to remain free from injury will improve Outcome: Progressing   Problem: Skin Integrity: Goal: Risk for impaired skin integrity will decrease Outcome: Progressing   Problem: Clinical Measurements: Goal: Respiratory complications will improve Outcome: Not Progressing   Problem: Activity: Goal: Risk for activity intolerance will decrease Outcome: Not Progressing   Problem: Coping: Goal: Level of anxiety will decrease Outcome: Not Progressing

## 2021-03-02 LAB — BLOOD GAS, ARTERIAL
Acid-Base Excess: 4.2 mmol/L — ABNORMAL HIGH (ref 0.0–2.0)
Bicarbonate: 27.2 mmol/L (ref 20.0–28.0)
FIO2: 32
O2 Saturation: 94.3 %
Patient temperature: 37
pCO2 arterial: 64 mmHg — ABNORMAL HIGH (ref 32.0–48.0)
pH, Arterial: 7.296 — ABNORMAL LOW (ref 7.350–7.450)
pO2, Arterial: 81.3 mmHg — ABNORMAL LOW (ref 83.0–108.0)

## 2021-03-02 LAB — BASIC METABOLIC PANEL
Anion gap: 8 (ref 5–15)
BUN: 32 mg/dL — ABNORMAL HIGH (ref 6–20)
CO2: 25 mmol/L (ref 22–32)
Calcium: 8.7 mg/dL — ABNORMAL LOW (ref 8.9–10.3)
Chloride: 101 mmol/L (ref 98–111)
Creatinine, Ser: 0.89 mg/dL (ref 0.44–1.00)
GFR, Estimated: >60 mL/min (ref >60)
Glucose, Bld: 164 mg/dL — ABNORMAL HIGH (ref 70–99)
Potassium: 5.1 mmol/L (ref 3.5–5.1)
Sodium: 134 mmol/L — ABNORMAL LOW (ref 135–145)

## 2021-03-02 MED ORDER — IPRATROPIUM-ALBUTEROL 0.5-2.5 (3) MG/3ML IN SOLN
3.0000 mL | Freq: Two times a day (BID) | RESPIRATORY_TRACT | Status: DC
  Administered 2021-03-02 – 2021-03-05 (×7): 3 mL via RESPIRATORY_TRACT
  Filled 2021-03-02 (×8): qty 3

## 2021-03-02 MED ORDER — OXYCODONE HCL 5 MG PO TABS
5.0000 mg | ORAL_TABLET | Freq: Four times a day (QID) | ORAL | Status: DC | PRN
  Administered 2021-03-03 – 2021-03-04 (×3): 5 mg via ORAL
  Filled 2021-03-02 (×3): qty 1

## 2021-03-02 MED ORDER — FUROSEMIDE 10 MG/ML IJ SOLN
20.0000 mg | Freq: Every day | INTRAMUSCULAR | Status: DC
  Administered 2021-03-03: 20 mg via INTRAVENOUS
  Filled 2021-03-02: qty 2

## 2021-03-02 NOTE — TOC Initial Note (Signed)
Transition of Care [facility]) - Initial/Assessment Note    Patient Details  Name: Kathleen Wright MRN: [id number] Date of Birth: 07-30-1965  Transition of Care [facility]) CM/SW Contact:    Salome Arnt, LCSW Phone Number: 03/02/2021, 11:19 AM  Clinical Narrative:  Pt admitted due to acute respiratory failure with hypoxia. TOC consulted for CHF screening. Pt reports she lives alone. Her husband passed aware in March of this year. Pt's daughter lives a few minutes away and she has other family in [place] and [place]. At baseline, pt is independent with ADLs. She drives herself to appointments. Pt plans to return home when medically stable, but said her daughter may come stay with her.   CHF screening completed. Pt indicates she is not weighing herself daily. She has not been following a heart healthy diet recently, but said she plans to get back on track. LCSW educated on importance of daily weights and following heart healthy diet. Pt states she takes medications as prescribed. TOC will order Living Well with CHF book for pt.                Expected Discharge Plan: Home/Self Care Barriers to Discharge: Continued Medical Work up   Patient Goals and CMS Choice Patient states their goals for this hospitalization and ongoing recovery are:: return home   Choice offered to / list presented to : Patient  Expected Discharge Plan and Services Expected Discharge Plan: Home/Self Care In-house Referral: Clinical Social Work     Living arrangements for the past 2 months: Single Family Home                 DME Arranged: N/A                    Prior Living Arrangements/Services Living arrangements for the past 2 months: Single Family Home Lives with:: Self Patient language and need for interpreter reviewed:: Yes Do you feel safe going back to the place where you live?: Yes          Current home services: DME (wheelchair) Criminal Activity/Legal Involvement Pertinent to Current  Situation/Hospitalization: No - Comment as needed  Activities of Daily Living Home Assistive Devices/Equipment: Cane (specify quad or straight) ADL Screening (condition at time of admission) Patient's cognitive ability adequate to safely complete daily activities?: Yes Is the patient deaf or have difficulty hearing?: No Does the patient have difficulty seeing, even when wearing glasses/contacts?: No Does the patient have difficulty concentrating, remembering, or making decisions?: No Patient able to express need for assistance with ADLs?: Yes Does the patient have difficulty dressing or bathing?: No Independently performs ADLs?: Yes (appropriate for developmental age) Does the patient have difficulty walking or climbing stairs?: No Weakness of Legs: Both Weakness of Arms/Hands: None  Permission Sought/Granted                  Emotional Assessment   Attitude/Demeanor/Rapport: Engaged Affect (typically observed): Accepting Orientation: : Oriented to Self, Oriented to Place, Oriented to  Time, Oriented to Situation      Admission diagnosis:  COPD exacerbation ([place]) [J44.1] Acute respiratory failure with hypoxia ([place]) [J96.01] COPD with acute exacerbation ([place]) [J44.1] Patient Active Problem List   Diagnosis Date Noted   COPD with acute exacerbation ([place]) 02/28/2021   Protein-calorie malnutrition, mild ([place]) 02/28/2021   Respiratory failure with hypoxia ([place]) 02/28/2021   Left carotid bruit 08/27/2015   COPD exacerbation ([place]) 08/10/2015   PAF (paroxysmal atrial fibrillation) ([place]) 08/10/2015  Hypertrophic obstructive cardiomyopathy ([place]) 06/10/2015   Atypical atrial flutter ([place]) 06/10/2015   History of atrial fibrillation 06/10/2015   Elevated troponin 06/10/2015   COPD (chronic obstructive pulmonary disease) ([place]) 06/10/2015   Tobacco abuse 06/10/2015   Chest pain 06/09/2015   History of ventricular septal myectomy 06/09/2015   Palpitations 06/09/2015   H N  P-LUMBAR 09/17/2008   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

## 2021-03-02 NOTE — Progress Notes (Signed)
PROGRESS NOTE    Kathleen Wright  QQI:[id number] DOB: May 15, 1966 DOA: 02/27/2021 PCP: Cory Munch, PA-C    Chief Complaint  Patient presents with   Shortness of Breath    Brief admission narrative:  As per H&P written by Dr.Zierle-Ghosh on 02/28/21 Kathleen Wright  is a 55 y.o. female, with history of asthma, COPD, hypertrophic cardiomyopathy, paroxysmal atrial fibrillation, and more presents the ED with a chief complaint of dyspnea.  Is been ongoing for 1 week.  Its been getting worse over the week.  She reports that it is worse with exertion, she also has orthopnea.  She reports that she has had orthopnea since she had open heart surgery.  That part is not new.  She has peripheral edema with left greater than right.  That has been going on for longer than 1 week.  She reports a productive cough with green/white/brown [place].  This is a change in color as her normal sputum is clear or white.  She denies any fevers.  She has tried albuterol and ipratropium 2 times today without relief prior to presenting to the ED.  She reports a chest tightness that is diffuse across her chest.  That is been going on for 2 days.  It is also worse with exertion.  She does not require oxygen at baseline, but she is requiring oxygen today.  Patient reports that she has no other complaints at this time.   Patient does not smoke, recently quit using Chantix, does not drink alcohol, does not use illicit drugs.  She is vaccinated for COVID.  Patient is full code.   In the ED Patient is afebrile, normotensive, satting at 99% on 4 L nasal cannula She has a leukocytosis of 17.3-she was given Solu-Medrol in route to the ED Thrombocytosis that is likely acute phase reactant of 468 Slight hyponatremia at 131, otherwise chemistry panel is unremarkable Albumin is 3.3 Respiratory panel is negative EKG shows a heart rate of 68, QTc 463, sinus rhythm, some nonspecific ST changes that were also present on the September 06, 2020  EKG Patient was started on Rocephin and Zithromax to cover for possible pneumonia CT angio was performed because of the hypoxia and left lower extremity asymmetric edema, that showed no evidence for pulmonary embolus.  Showed cardiac enlargement with evidence of right heart failure.  Bronchial wall thickening and peribronchial nodular airspace infiltrates likely representing multifocal pneumonia.   Assessment & Plan: 1-acute respiratory failure with hypoxia secondary to acute on chronic congestive heart failure (diastolic and right heart sided failure), bronchiectasis/pneumonia and COPD exacerbation. -Patient complaining of lower extremity edema, orthopnea, dyspnea on exertion and increased wheezing along with shortness of breath and the need of oxygen supplementation due to new development of hypoxia. -CT angiogram of the chest demonstrated no pulmonary embolism but had bronchial wall thickening and peribronchial nodular infiltrates representing multifocal pneumonia. -2D echo with positive grade 3 diastolic dysfunction, ejection fraction 65 to 70%, no wall motion abnormalities and the presence of low normal right ventricular systolic function. -Will continue treatment with IV diuresis (but dose will be adjusted, as patient currently demonstrating no crackles), continue low-sodium diet, daily weights and strict I's and O's. -Continue also nebulizer management, IV steroids, mucolytic/antitussive regimen; and IV antibiotics (Rocephin and Zithromax). -Continue to wean off oxygen supplementation as tolerated; goal is for O2 sats above 90%.  -Continue the use of flutter valve and follow clinical response. -Given increased somnolence ABG has been checked; CO2 mildly elevated for someone  with underlying history of COPD.  Oxygen saturation was 81 on patient's ABG; instructions given to nursing staff to decrease oxygen saturation to avoid CO2 retention. -Minimize the use of narcotics and avoid the use of  benzodiazepines.  If patient's mentation failed to completely improve my injury requiring intermittent use of CPAP/BiPAP.  2-History of atrial fibrillation -Rate controlled -Continue the use of Cardizem -Continue the use of Pradaxa for secondary prevention. -Metoprolol has been discontinued in the setting of bradycardia.  3-Elevated troponin -In the setting of demand ischemia secondary to ongoing congestive heart failure exacerbation and pulmonary process. -Patient denies chest pain -No ischemic changes on EKG at time of admission or telemetry evaluation.  4-class II obesity -Body mass index is 37.53 kg/m. -Low calorie diet, portion control and increase physical activity discussed with patient. -Will benefit of outpatient sleep study.  5-gastroesophageal reflux disease -Continue PPI.  DVT prophylaxis: Pradaxa Code Status: Full Code. Family Communication: Daughter at bedside. Disposition:   Status is: Inpatient  Remains inpatient appropriate because:IV treatments appropriate due to intensity of illness or inability to take PO  Dispo: The patient is from: Home              Anticipated d/c is to: Home              Patient currently is not medically stable to d/c.   Difficult to place patient No       Consultants:  None  Procedures:  See below for x-ray reports. 2D echo: 1. Left ventricular ejection fraction, by estimation, is 65 to 70%. The  left ventricle has normal function. The left ventricle has no regional  wall motion abnormalities. There is moderate asymmetric left ventricular  hypertrophy of the septal segment.  Left ventricular diastolic parameters are consistent with Grade III  diastolic dysfunction (restrictive).   2. Right ventricular systolic function is low normal. The right  ventricular size is mildly enlarged. Mildly increased right ventricular  wall thickness. There is mildly elevated pulmonary artery systolic  pressure. The estimated right  ventricular systolic   pressure is 38.4 mmHg.   3. Left atrial size was severely dilated.   4. Right atrial size was moderately dilated.   5. Moderate pericardial effusion. The pericardial effusion is posterior  to the left ventricle.   6. The mitral valve is abnormal. Moderate mitral valve regurgitation.  Moderate mitral annular calcification.   7. Tricuspid valve regurgitation is mild to moderate.   8. The aortic valve is tricuspid. Aortic valve regurgitation is not  visualized.   9. The inferior vena cava is normal in size with greater than 50%  respiratory variability, suggesting right atrial pressure of 3 mmHg.   Antimicrobials:  Zithromax   Subjective: Afebrile, no chest pain, no vomiting.  Patient reports an intermittent nausea and is complaining of back pain.  Overnight received the use of lorazepam to assist her sleeping and has ended developing increased somnolence and a slight confusion.  Patient is having difficulty speaking in full sentences.  Objective: Vitals:   03/02/21 0425 03/02/21 0712 03/02/21 0800 03/02/21 1333  BP: 140/82  130/73 127/74  Pulse: 69  71 71  Resp: 20  15 16   Temp: 98.1 F (36.7 C)  97.8 F (36.6 C) 98.2 F (36.8 C)  TempSrc:      SpO2: 97% 97% 95% 95%  Weight: 90.1 kg     Height:        Intake/Output Summary (Last 24 hours) at 03/02/2021 1821 Last data  filed at 03/02/2021 1300 Gross per 24 hour  Intake 540 ml  Output 1600 ml  Net -1060 ml   Filed Weights   02/27/21 1754 03/01/21 0500 03/02/21 0425  Weight: 81.6 kg 88.6 kg 90.1 kg    Examination: General exam: Alert, awake, oriented x 3; intermittently confused and somnolent as per family report.  Patient received lorazepam to help her sleep overnight.  No chest pain, no nausea, no vomiting.  Having difficulty to speak in full sentences and is still requiring around 3 L nasal cannula supplementation to keep O2 sat above 90%.  Good urine output reported Respiratory system: No frank  crackles on exam, positive expiratory wheezing and rhonchi appreciated.  Fair air movement bilaterally.  No using accessory muscles. Cardiovascular system:RRR. No murmurs, rubs, gallops. Gastrointestinal system: Abdomen is obese, nondistended, soft and nontender. No organomegaly or masses felt. Normal bowel sounds heard. Central nervous system: Alert and oriented. No focal neurological deficits. Extremities: 1-2+ edema bilaterally, no cyanosis, no clubbing. Skin: No petechiae; multiple bruises appreciated in her extremities from blood drawn and prior to admission skin injury. Psychiatry: Judgement and insight appear normal. Mood & affect appropriate.    Data Reviewed: I have personally reviewed following labs and imaging studies  CBC: Recent Labs  Lab 02/27/21 2009 02/28/21 0445 03/01/21 0537  WBC 17.3* 16.6* 19.1*  NEUTROABS 13.7*  --   --   HGB 9.8* 9.2* 10.2*  HCT 32.4* 30.8* 34.0*  MCV 90.8 93.3 93.9  PLT 468* 464* 492*    Basic Metabolic Panel: Recent Labs  Lab 02/27/21 2009 02/28/21 0445 03/01/21 0537 03/02/21 0501  NA 131* 133* 132* 134*  K 4.1 4.8 5.2* 5.1  CL 99 101 98 101  CO2 27 25 24 25   GLUCOSE 123* 200* 178* 164*  BUN 15 15 28* 32*  CREATININE 0.62 0.64 1.23* 0.89  CALCIUM 8.1* 8.4* 8.5* 8.7*  MG  --  1.9  --   --     GFR: Estimated Creatinine Clearance: 72.1 mL/min (by C-G formula based on SCr of 0.89 mg/dL).  Liver Function Tests: Recent Labs  Lab 02/27/21 2009 02/28/21 0445  AST 14* 16  ALT 16 16  ALKPHOS 120 108  BILITOT 1.0 0.9  PROT 6.8 6.6  ALBUMIN 3.3* 3.2*    CBG: Recent Labs  Lab 02/28/21 0328 02/28/21 1627  GLUCAP 201* 173*    Recent Results (from the past 240 hour(s))  Resp Panel by RT-PCR (Flu A&B, Covid) Nasopharyngeal Swab     Status: None   Collection Time: 02/27/21  8:28 PM   Specimen: Nasopharyngeal Swab; Nasopharyngeal(NP) swabs in vial transport medium  Result Value Ref Range Status   SARS Coronavirus 2 by RT  PCR NEGATIVE NEGATIVE Final    Comment: (NOTE) SARS-CoV-2 target nucleic acids are NOT DETECTED.  The SARS-CoV-2 RNA is generally detectable in upper respiratory specimens during the acute phase of infection. The lowest concentration of SARS-CoV-2 viral copies this assay can detect is 138 copies/mL. A negative result does not preclude SARS-Cov-2 infection and should not be used as the sole basis for treatment or other patient management decisions. A negative result may occur with  improper specimen collection/handling, submission of specimen other than nasopharyngeal swab, presence of viral mutation(s) within the areas targeted by this assay, and inadequate number of viral copies(<138 copies/mL). A negative result must be combined with clinical observations, patient history, and epidemiological information. The expected result is Negative.  Fact Sheet for Patients:  EntrepreneurPulse.com.au  Fact  Sheet for Healthcare Providers:  IncredibleEmployment.be  This test is no t yet approved or cleared by the [place] FDA and  has been authorized for detection and/or diagnosis of SARS-CoV-2 by FDA under an Emergency Use Authorization (EUA). This EUA will remain  in effect (meaning this test can be used) for the duration of the COVID-19 declaration under Section 564(b)(1) of the Act, 21 U.S.C.section 360bbb-3(b)(1), unless the authorization is terminated  or revoked sooner.       Influenza A by PCR NEGATIVE NEGATIVE Final   Influenza B by PCR NEGATIVE NEGATIVE Final    Comment: (NOTE) The Xpert Xpress SARS-CoV-2/FLU/RSV plus assay is intended as an aid in the diagnosis of influenza from Nasopharyngeal swab specimens and should not be used as a sole basis for treatment. Nasal washings and aspirates are unacceptable for Xpert Xpress SARS-CoV-2/FLU/RSV testing.  Fact Sheet for Patients: EntrepreneurPulse.com.au  Fact Sheet for  Healthcare Providers: IncredibleEmployment.be  This test is not yet approved or cleared by the [place] FDA and has been authorized for detection and/or diagnosis of SARS-CoV-2 by FDA under an Emergency Use Authorization (EUA). This EUA will remain in effect (meaning this test can be used) for the duration of the COVID-19 declaration under Section 564(b)(1) of the Act, 21 U.S.C. section 360bbb-3(b)(1), unless the authorization is terminated or revoked.  Performed at [facility], [address], [place], [place] [address]   Blood culture (routine x 2)     Status: None (Preliminary result)   Collection Time: 02/28/21  1:25 AM   Specimen: Right Antecubital; Blood  Result Value Ref Range Status   Specimen Description RIGHT ANTECUBITAL  Final   Special Requests   Final    BOTTLES DRAWN AEROBIC AND ANAEROBIC Blood Culture adequate volume   Culture   Final    NO GROWTH 2 DAYS Performed at [facility], [address] [place], [place] [address]    Report Status PENDING  Incomplete  Blood culture (routine x 2)     Status: None (Preliminary result)   Collection Time: 02/28/21  4:22 AM   Specimen: BLOOD LEFT HAND  Result Value Ref Range Status   Specimen Description   Final    BLOOD LEFT HAND BOTTLES DRAWN AEROBIC AND ANAEROBIC   Special Requests Blood Culture adequate volume  Final   Culture   Final    NO GROWTH 2 DAYS Performed at  Hospital, [address], [place], [place] [address]    Report Status PENDING  Incomplete  Culture, Respiratory w Gram Stain     Status: None (Preliminary result)   Collection Time: 02/28/21  2:34 PM   Specimen: SPU  Result Value Ref Range Status   Specimen Description   Final    SPUTUM Performed at [facility], [address], [place], [place] [address]    Special Requests   Final    NONE Performed at [facility], [address] [place], [place] [address]    Gram Stain RARE WBC PRESENT, PREDOMINANTLY PMN FEW YEAST    Final   Culture   Final    CULTURE REINCUBATED FOR BETTER GROWTH Performed at [facility], [place] [address], [place], [place] [address]    Report Status PENDING  Incomplete     Radiology Studies: No results found.   Scheduled Meds:  arformoterol  15 mcg Nebulization BID   aspirin EC  81 mg Oral Daily   budesonide (PULMICORT) nebulizer solution  0.5 mg Nebulization BID   dabigatran  150 mg Oral BID   dextromethorphan-guaiFENesin  1 tablet Oral BID   diltiazem  180 mg Oral Daily   [START ON 03/03/2021] furosemide  20 mg Intravenous Daily   ipratropium-albuterol  3 mL Nebulization BID   methylPREDNISolone (SOLU-MEDROL) injection  80 mg Intravenous Q12H   pantoprazole  40 mg Oral Daily   revefenacin  175 mcg Nebulization Daily   Continuous Infusions:  azithromycin 500 mg (03/02/21 0250)   cefTRIAXone (ROCEPHIN)  IV 1 g (03/02/21 0142)     LOS: 2 days    Time spent: 35 minutes   Barton Dubois, MD [facility]   To contact the attending provider between 7A-7P or the covering provider during after hours 7P-7A, please log into the web site [email or web address] and access using universal [place] password for that web site. If you do not have the password, please call the hospital operator.  03/02/2021, 6:21 PM

## 2021-03-02 NOTE — Progress Notes (Signed)
Family voiced concerns over patient mentation.  Explained to daughter that  patient has possible infection (on antibiotics), new use of oxygen, and medications may be contributed to altered mentation.  Patient is alert and speaking, but daughter states that patient is delusional and is confused.  Notified MD on call about patient ABG that was done earlier today.  Received order for cpap.  Explained use of cpap to both daughter and patient and educated on how it can be use to help improve mentation in respiratory patients.  Daughter and patient both agreed.  Will continue to monitor patient.

## 2021-03-02 NOTE — Progress Notes (Signed)
Patient placed on cpap by respiratory at 2100.

## 2021-03-03 LAB — BASIC METABOLIC PANEL
Anion gap: 8 (ref 5–15)
BUN: 29 mg/dL — ABNORMAL HIGH (ref 6–20)
CO2: 30 mmol/L (ref 22–32)
Calcium: 8.6 mg/dL — ABNORMAL LOW (ref 8.9–10.3)
Chloride: 97 mmol/L — ABNORMAL LOW (ref 98–111)
Creatinine, Ser: 0.79 mg/dL (ref 0.44–1.00)
GFR, Estimated: >60 mL/min (ref >60)
Glucose, Bld: 140 mg/dL — ABNORMAL HIGH (ref 70–99)
Potassium: 4.5 mmol/L (ref 3.5–5.1)
Sodium: 135 mmol/L (ref 135–145)

## 2021-03-03 LAB — CBC
HCT: 30.8 % — ABNORMAL LOW (ref 36.0–46.0)
Hemoglobin: 9.2 g/dL — ABNORMAL LOW (ref 12.0–15.0)
MCH: 27.5 pg (ref 26.0–34.0)
MCHC: 29.9 g/dL — ABNORMAL LOW (ref 30.0–36.0)
MCV: 92.2 fL (ref 80.0–100.0)
Platelets: 483 10*3/uL — ABNORMAL HIGH (ref 150–400)
RBC: 3.34 MIL/uL — ABNORMAL LOW (ref 3.87–5.11)
RDW: 17.3 % — ABNORMAL HIGH (ref 11.5–15.5)
WBC: 11.2 10*3/uL — ABNORMAL HIGH (ref 4.0–10.5)
nRBC: 0.2 % (ref 0.0–0.2)

## 2021-03-03 LAB — CULTURE, RESPIRATORY W GRAM STAIN: Culture: NORMAL

## 2021-03-03 MED ORDER — SENNA 8.6 MG PO TABS
1.0000 | ORAL_TABLET | Freq: Every day | ORAL | Status: DC
  Administered 2021-03-03 – 2021-03-08 (×6): 8.6 mg via ORAL
  Filled 2021-03-03 (×7): qty 1

## 2021-03-03 MED ORDER — METHYLPREDNISOLONE SODIUM SUCC 40 MG IJ SOLR
40.0000 mg | Freq: Two times a day (BID) | INTRAMUSCULAR | Status: DC
  Administered 2021-03-03 – 2021-03-07 (×8): 40 mg via INTRAVENOUS
  Filled 2021-03-03 (×8): qty 1

## 2021-03-03 MED ORDER — QUETIAPINE FUMARATE 25 MG PO TABS: 25.0000 mg | ORAL_TABLET | Freq: Every day | ORAL | Status: DC

## 2021-03-03 MED ORDER — FUROSEMIDE 10 MG/ML IJ SOLN
20.0000 mg | Freq: Two times a day (BID) | INTRAMUSCULAR | Status: DC
  Administered 2021-03-03 – 2021-03-05 (×4): 20 mg via INTRAVENOUS
  Filled 2021-03-03 (×4): qty 2

## 2021-03-03 MED ORDER — MELATONIN 3 MG PO TABS
6.0000 mg | ORAL_TABLET | Freq: Once | ORAL | Status: AC
  Administered 2021-03-03: 6 mg via ORAL
  Filled 2021-03-03: qty 2

## 2021-03-03 MED ORDER — LIVING BETTER WITH HEART FAILURE BOOK
Freq: Once | Status: AC
  Filled 2021-03-03: qty 1

## 2021-03-03 MED ORDER — SODIUM CHLORIDE 0.9 % IV SOLN
INTRAVENOUS | Status: DC | PRN
  Administered 2021-03-03: 250 mL via INTRAVENOUS
  Administered 2021-03-08: 500 mL via INTRAVENOUS

## 2021-03-03 NOTE — Progress Notes (Signed)
   03/03/21 2014  Assess: MEWS Score  Temp 98.4 F (36.9 C)  BP 131/84  Pulse Rate (!) 112  Resp 19  SpO2 92 %  O2 Device Nasal Cannula  Assess: MEWS Score  MEWS Temp 0  MEWS Systolic 0  MEWS Pulse 2  MEWS RR 0  MEWS LOC 0  MEWS Score 2  MEWS Score Color Yellow  Assess: if the MEWS score is Yellow or Red  Were vital signs taken at a resting state? Yes  Focused Assessment Change from prior assessment (see assessment flowsheet)  Early Detection of Sepsis Score *See Row Information* Low  MEWS guidelines implemented *See Row Information* Yes  Treat  MEWS Interventions Administered prn meds/treatments (cardizen for pulse greater than 100)  Pain Score 0  Take Vital Signs  Increase Vital Sign Frequency  Yellow: Q 2hr X 2 then Q 4hr X 2, if remains yellow, continue Q 4hrs  Escalate  MEWS: Escalate Yellow: discuss with charge nurse/RN and consider discussing with provider and RRT  Notify: Charge Nurse/RN  Name of Charge Nurse/RN Notified [place]  Date Charge Nurse/RN Notified 03/03/21  Time Charge Nurse/RN Notified 2100  Document  Patient Outcome Other (Comment) (pulse 113 now at 2111, will recheck at 2214, also, just had nebs)

## 2021-03-03 NOTE — Progress Notes (Signed)
PROGRESS NOTE    Kathleen Wright  ANV:[id number] DOB: 10/02/1965 DOA: 02/27/2021 PCP: Cory Munch, PA-C   Brief Narrative:   Kathleen Wright  is a 55 y.o. female, with history of asthma, COPD, hypertrophic cardiomyopathy, paroxysmal atrial fibrillation, and more presents the ED with a chief complaint of dyspnea.  Is been ongoing for 1 week.  Its been getting worse over the week.  She has been admitted for acute hypoxemic respiratory failure that is multifactorial and secondary to acute on chronic diastolic congestive heart failure as well as acute COPD exacerbation related to bronchiectasis/pneumonia.  Assessment & Plan:   Active Problems:   History of atrial fibrillation   Elevated troponin   COPD with acute exacerbation (HCC)   Protein-calorie malnutrition, mild (HCC)   Respiratory failure with hypoxia (HCC)   1-acute respiratory failure with hypoxia secondary to acute on chronic congestive heart failure (diastolic and right heart sided failure), bronchiectasis/pneumonia and COPD exacerbation. -Patient complaining of lower extremity edema, orthopnea, dyspnea on exertion and increased wheezing along with shortness of breath and the need of oxygen supplementation due to new development of hypoxia. -CT angiogram of the chest demonstrated no pulmonary embolism but had bronchial wall thickening and peribronchial nodular infiltrates representing multifocal pneumonia. -2D echo with positive grade 3 diastolic dysfunction, ejection fraction 65 to 70%, no wall motion abnormalities and the presence of low normal right ventricular systolic function. -Will continue treatment with IV diuresis (but dose will be adjusted, as patient currently demonstrating no crackles), continue low-sodium diet, daily weights and strict I's and O's.  Increased to IV 20 mg twice daily. -Continue also nebulizer management, IV steroids, mucolytic/antitussive regimen; and IV antibiotics (Rocephin and Zithromax).  Decrease  steroids to 40 mg IV twice daily to assist with delirium -Continue to wean off oxygen supplementation as tolerated; goal is for O2 sats above 90%.  -Continue the use of flutter valve and follow clinical response. -Given increased somnolence ABG has been checked; CO2 mildly elevated for someone with underlying history of COPD.  Oxygen saturation was 81 on patient's ABG; instructions given to nursing staff to decrease oxygen saturation to avoid CO2 retention. -Minimize the use of narcotics and avoid the use of benzodiazepines.  If patient's mentation failed to completely improve my injury requiring intermittent use of CPAP/BiPAP.   2-History of atrial fibrillation -Rate controlled -Continue the use of Cardizem -Continue the use of Pradaxa for secondary prevention. -Metoprolol has been discontinued in the setting of bradycardia.   3-Elevated troponin -In the setting of demand ischemia secondary to ongoing congestive heart failure exacerbation and pulmonary process. -Patient denies chest pain -No ischemic changes on EKG at time of admission or telemetry evaluation.   4-class II obesity -Body mass index is 37.53 kg/m. -Low calorie diet, portion control and increase physical activity discussed with patient. -Will benefit of outpatient sleep study.   5-gastroesophageal reflux disease -Continue PPI.  6-hospital-acquired delirium -Plan to decrease steroids as this may be contributing a component of psychosis -Hold off on Seroquel for now given some increased somnolence   DVT prophylaxis: Pradaxa Code Status: Full Family Communication: Daughter at bedside 9/21 Disposition Plan:  Status is: Inpatient  Remains inpatient appropriate because:Altered mental status, IV treatments appropriate due to intensity of illness or inability to take PO, and Inpatient level of care appropriate due to severity of illness  Dispo: The patient is from: Home              Anticipated d/c is to: Home  Patient currently is not medically stable to d/c.   Difficult to place patient No   Consultants:  None  Procedures:  See below  Antimicrobials:  Anti-infectives (From admission, onward)    Start     Dose/Rate Route Frequency Ordered Stop   03/01/21 0000  cefTRIAXone (ROCEPHIN) 1 g in sodium chloride 0.9 % 100 mL IVPB        1 g 200 mL/hr over 30 Minutes Intravenous Every 24 hours 02/28/21 0259     02/28/21 0000  cefTRIAXone (ROCEPHIN) 1 g in sodium chloride 0.9 % 100 mL IVPB        1 g 200 mL/hr over 30 Minutes Intravenous  Once 02/27/21 2358 02/28/21 0107   02/28/21 0000  azithromycin (ZITHROMAX) 500 mg in sodium chloride 0.9 % 250 mL IVPB        500 mg 250 mL/hr over 60 Minutes Intravenous Every 24 hours 02/27/21 2358         Subjective: Patient seen and evaluated today with ongoing somnolence noted per daughter at bedside.  She continues to have difficulty with her respirations and is still requiring 3 L nasal cannula oxygen.  Objective: Vitals:   03/02/21 2359 03/03/21 0500 03/03/21 0748 03/03/21 0900  BP: (!) 159/87 140/82  (!) 161/90  Pulse: 82 80  89  Resp: 18 18  18   Temp: 98.1 F (36.7 C) 98.4 F (36.9 C)    TempSrc: Oral Oral    SpO2: 97% 99% 98% 97%  Weight:  84.9 kg    Height:        Intake/Output Summary (Last 24 hours) at 03/03/2021 0939 Last data filed at 03/02/2021 2000 Gross per 24 hour  Intake 480 ml  Output 1500 ml  Net -1020 ml   Filed Weights   03/01/21 0500 03/02/21 0425 03/03/21 0500  Weight: 88.6 kg 90.1 kg 84.9 kg    Examination:  General exam: Appears calm and comfortable, intermittently somnolent Respiratory system: Clear to auscultation. Respiratory effort normal.  Currently on 3 L nasal cannula Cardiovascular system: S1 & S2 heard, RRR.  Gastrointestinal system: Abdomen is soft Central nervous system: Alert and awake Extremities: 2+ pitting edema bilaterally Skin: No significant lesions noted Psychiatry: Flat  affect.    Data Reviewed: I have personally reviewed following labs and imaging studies  CBC: Recent Labs  Lab 02/27/21 2009 02/28/21 0445 03/01/21 0537 03/03/21 0448  WBC 17.3* 16.6* 19.1* 11.2*  NEUTROABS 13.7*  --   --   --   HGB 9.8* 9.2* 10.2* 9.2*  HCT 32.4* 30.8* 34.0* 30.8*  MCV 90.8 93.3 93.9 92.2  PLT 468* 464* 492* 466*   Basic Metabolic Panel: Recent Labs  Lab 02/27/21 2009 02/28/21 0445 03/01/21 0537 03/02/21 0501 03/03/21 0448  NA 131* 133* 132* 134* 135  K 4.1 4.8 5.2* 5.1 4.5  CL 99 101 98 101 97*  CO2 27 25 24 25 30   GLUCOSE 123* 200* 178* 164* 140*  BUN 15 15 28* 32* 29*  CREATININE 0.62 0.64 1.23* 0.89 0.79  CALCIUM 8.1* 8.4* 8.5* 8.7* 8.6*  MG  --  1.9  --   --   --    GFR: Estimated Creatinine Clearance: 77.6 mL/min (by C-G formula based on SCr of 0.79 mg/dL). Liver Function Tests: Recent Labs  Lab 02/27/21 2009 02/28/21 0445  AST 14* 16  ALT 16 16  ALKPHOS 120 108  BILITOT 1.0 0.9  PROT 6.8 6.6  ALBUMIN 3.3* 3.2*   No results for  input(s): LIPASE, AMYLASE in the last 168 hours. No results for input(s): AMMONIA in the last 168 hours. Coagulation Profile: No results for input(s): INR, PROTIME in the last 168 hours. Cardiac Enzymes: No results for input(s): CKTOTAL, CKMB, CKMBINDEX, TROPONINI in the last 168 hours. BNP (last 3 results) No results for input(s): PROBNP in the last 8760 hours. HbA1C: No results for input(s): HGBA1C in the last 72 hours. CBG: Recent Labs  Lab 02/28/21 0328 02/28/21 1627  GLUCAP 201* 173*   Lipid Profile: No results for input(s): CHOL, HDL, LDLCALC, TRIG, CHOLHDL, LDLDIRECT in the last 72 hours. Thyroid Function Tests: No results for input(s): TSH, T4TOTAL, FREET4, T3FREE, THYROIDAB in the last 72 hours. Anemia Panel: No results for input(s): VITAMINB12, FOLATE, FERRITIN, TIBC, IRON, RETICCTPCT in the last 72 hours. Sepsis Labs: No results for input(s): PROCALCITON, LATICACIDVEN in the last 168  hours.  Recent Results (from the past 240 hour(s))  Resp Panel by RT-PCR (Flu A&B, Covid) Nasopharyngeal Swab     Status: None   Collection Time: 02/27/21  8:28 PM   Specimen: Nasopharyngeal Swab; Nasopharyngeal(NP) swabs in vial transport medium  Result Value Ref Range Status   SARS Coronavirus 2 by RT PCR NEGATIVE NEGATIVE Final    Comment: (NOTE) SARS-CoV-2 target nucleic acids are NOT DETECTED.  The SARS-CoV-2 RNA is generally detectable in upper respiratory specimens during the acute phase of infection. The lowest concentration of SARS-CoV-2 viral copies this assay can detect is 138 copies/mL. A negative result does not preclude SARS-Cov-2 infection and should not be used as the sole basis for treatment or other patient management decisions. A negative result may occur with  improper specimen collection/handling, submission of specimen other than nasopharyngeal swab, presence of viral mutation(s) within the areas targeted by this assay, and inadequate number of viral copies(<138 copies/mL). A negative result must be combined with clinical observations, patient history, and epidemiological information. The expected result is Negative.  Fact Sheet for Patients:  EntrepreneurPulse.com.au  Fact Sheet for Healthcare Providers:  IncredibleEmployment.be  This test is no t yet approved or cleared by the [place] FDA and  has been authorized for detection and/or diagnosis of SARS-CoV-2 by FDA under an Emergency Use Authorization (EUA). This EUA will remain  in effect (meaning this test can be used) for the duration of the COVID-19 declaration under Section 564(b)(1) of the Act, 21 U.S.C.section 360bbb-3(b)(1), unless the authorization is terminated  or revoked sooner.       Influenza A by PCR NEGATIVE NEGATIVE Final   Influenza B by PCR NEGATIVE NEGATIVE Final    Comment: (NOTE) The Xpert Xpress SARS-CoV-2/FLU/RSV plus assay is intended  as an aid in the diagnosis of influenza from Nasopharyngeal swab specimens and should not be used as a sole basis for treatment. Nasal washings and aspirates are unacceptable for Xpert Xpress SARS-CoV-2/FLU/RSV testing.  Fact Sheet for Patients: EntrepreneurPulse.com.au  Fact Sheet for Healthcare Providers: IncredibleEmployment.be  This test is not yet approved or cleared by the [place] FDA and has been authorized for detection and/or diagnosis of SARS-CoV-2 by FDA under an Emergency Use Authorization (EUA). This EUA will remain in effect (meaning this test can be used) for the duration of the COVID-19 declaration under Section 564(b)(1) of the Act, 21 U.S.C. section 360bbb-3(b)(1), unless the authorization is terminated or revoked.  Performed at [facility], [address] [place], [place] [address]   Blood culture (routine x 2)     Status: None (Preliminary result)   Collection  Time: 02/28/21  1:25 AM   Specimen: Right Antecubital; Blood  Result Value Ref Range Status   Specimen Description RIGHT ANTECUBITAL  Final   Special Requests   Final    BOTTLES DRAWN AEROBIC AND ANAEROBIC Blood Culture adequate volume   Culture   Final    NO GROWTH 3 DAYS Performed at [facility], [address], [place], [place] [address]    Report Status PENDING  Incomplete  Blood culture (routine x 2)     Status: None (Preliminary result)   Collection Time: 02/28/21  4:22 AM   Specimen: BLOOD LEFT HAND  Result Value Ref Range Status   Specimen Description   Final    BLOOD LEFT HAND BOTTLES DRAWN AEROBIC AND ANAEROBIC   Special Requests Blood Culture adequate volume  Final   Culture   Final    NO GROWTH 3 DAYS Performed at [facility], [address] [place], [place] [address]    Report Status PENDING  Incomplete  Culture, Respiratory w Gram Stain     Status: None (Preliminary result)   Collection Time: 02/28/21  2:34 PM   Specimen: SPU  Result  Value Ref Range Status   Specimen Description   Final    SPUTUM Performed at [facility], [address] [place], [place] [address]    Special Requests   Final    NONE Performed at [facility], [address], [place], [place] [address]    Gram Stain RARE WBC PRESENT, PREDOMINANTLY PMN FEW YEAST   Final   Culture   Final    CULTURE REINCUBATED FOR BETTER GROWTH Performed at [facility], [place] [address] [place], [place] [address]    Report Status PENDING  Incomplete         Radiology Studies: No results found.      Scheduled Meds:  arformoterol  15 mcg Nebulization BID   aspirin EC  81 mg Oral Daily   budesonide (PULMICORT) nebulizer solution  0.5 mg Nebulization BID   dabigatran  150 mg Oral BID   dextromethorphan-guaiFENesin  1 tablet Oral BID   diltiazem  180 mg Oral Daily   furosemide  20 mg Intravenous Q12H   ipratropium-albuterol  3 mL Nebulization BID   methylPREDNISolone (SOLU-MEDROL) injection  40 mg Intravenous Q12H   pantoprazole  40 mg Oral Daily   QUEtiapine  25 mg Oral QHS   revefenacin  175 mcg Nebulization Daily   Continuous Infusions:  azithromycin 500 mg (03/03/21 0138)   cefTRIAXone (ROCEPHIN)  IV 1 g (03/03/21 0017)     LOS: 3 days    Time spent: 35 minutes    Juanna Pudlo Darleen Crocker, DO [facility]  If 7PM-7AM, please contact night-coverage [email or web address] 03/03/2021, 9:39 AM

## 2021-03-03 NOTE — Progress Notes (Signed)
Spoke with patient and patient daughter.  Patient states that before admission, she had been hallucinating and seeing bugs in her home, although none actually there.  Patient has admitted to seeing bugs tonight on the wall.  Patient daughter also states before admission, patient was relying more heavily upon inhaler and still having difficulty breathing at home.  Explained to patient and daughter that patient was having breathing difficulties at home, copd exacerbation, and to seek medical treatment quicker, as her condition could deteriorate quickly if not treated.  Patient has been more alert tonight, still confused at times but more conversive.  Patient has worn cpap intermittently from 9pm-12 am, 1-2 am, and from 3am-6am.  Explained to patient mentation may improve quicker if she adheres to wearing cpap.  Daughter and patient in agreement. Will continue to monitor patient.

## 2021-03-03 NOTE — Progress Notes (Signed)
   03/03/21 2215  Assess: MEWS Score  BP 119/75  Pulse Rate 77 (rechecked manually)  Resp 18  Level of Consciousness Alert  O2 Device CPAP  Assess: if the MEWS score is Yellow or Red  Were vital signs taken at a resting state? Yes  Focused Assessment No change from prior assessment  Early Detection of Sepsis Score *See Row Information* Low  MEWS guidelines implemented *See Row Information* No, previously yellow, continue vital signs every 4 hours  Treat  MEWS Interventions Other (Comment)  Pain Score 4  Pain Type Chronic pain  Pain Location Back  Pain Orientation Mid  Pain Intervention(s) Medication (See eMAR)  Take Vital Signs  Increase Vital Sign Frequency  Yellow: Q 2hr X 2 then Q 4hr X 2, if remains yellow, continue Q 4hrs  Notify: Charge Nurse/RN  Name of Charge Nurse/RN Notified Letina  Date Charge Nurse/RN Notified 03/03/21  Time Charge Nurse/RN Notified 2232  Notify: Provider  Provider Name/Title Dr. Orie Rout  Date Provider Notified 03/03/21  Time Provider Notified 2232  Notification Type Page  Notification Reason Other (Comment) (tele called and 4.23 sec arrest)  Provider response  (ekg)  Date of Provider Response 03/03/21  Time of Provider Response 2238  Document  Patient Outcome Stabilized after interventions

## 2021-03-03 NOTE — Progress Notes (Signed)
Tele called earlier with report of 4.23 second pause.  Vitals stable.  Contacted Dr. Orie Rout and he ordered EKG for next time patient is awake.

## 2021-03-04 LAB — BASIC METABOLIC PANEL
Anion gap: 10 (ref 5–15)
BUN: 24 mg/dL — ABNORMAL HIGH (ref 6–20)
CO2: 34 mmol/L — ABNORMAL HIGH (ref 22–32)
Calcium: 8.8 mg/dL — ABNORMAL LOW (ref 8.9–10.3)
Chloride: 91 mmol/L — ABNORMAL LOW (ref 98–111)
Creatinine, Ser: 0.72 mg/dL (ref 0.44–1.00)
GFR, Estimated: >60 mL/min (ref >60)
Glucose, Bld: 148 mg/dL — ABNORMAL HIGH (ref 70–99)
Potassium: 4.3 mmol/L (ref 3.5–5.1)
Sodium: 135 mmol/L (ref 135–145)

## 2021-03-04 LAB — CBC
HCT: 34.1 % — ABNORMAL LOW (ref 36.0–46.0)
Hemoglobin: 10.4 g/dL — ABNORMAL LOW (ref 12.0–15.0)
MCH: 28.1 pg (ref 26.0–34.0)
MCHC: 30.5 g/dL (ref 30.0–36.0)
MCV: 92.2 fL (ref 80.0–100.0)
Platelets: 533 10*3/uL — ABNORMAL HIGH (ref 150–400)
RBC: 3.7 MIL/uL — ABNORMAL LOW (ref 3.87–5.11)
RDW: 17.3 % — ABNORMAL HIGH (ref 11.5–15.5)
WBC: 13.6 10*3/uL — ABNORMAL HIGH (ref 4.0–10.5)
nRBC: 0 % (ref 0.0–0.2)

## 2021-03-04 LAB — MAGNESIUM: Magnesium: 2.1 mg/dL (ref 1.7–2.4)

## 2021-03-04 MED ORDER — DIPHENHYDRAMINE HCL 25 MG PO CAPS
25.0000 mg | ORAL_CAPSULE | Freq: Once | ORAL | Status: AC
  Administered 2021-03-04: 25 mg via ORAL
  Filled 2021-03-04: qty 1

## 2021-03-04 MED ORDER — PNEUMOCOCCAL VAC POLYVALENT 25 MCG/0.5ML IJ INJ: 0.5000 mL | INJECTION | INTRAMUSCULAR | Status: DC

## 2021-03-04 MED ORDER — INFLUENZA VAC SPLIT QUAD 0.5 ML IM SUSY: 0.5000 mL | PREFILLED_SYRINGE | INTRAMUSCULAR | Status: DC

## 2021-03-04 NOTE — Progress Notes (Signed)
Unable to tolerate cpap except for short period  Sats in mid 90's on 3 liters nasal canula.  After bath this morning walked to recliner and preferred to sit up for a while. Pulse currently SR at 79

## 2021-03-04 NOTE — Plan of Care (Signed)
  Problem: Acute Rehab PT Goals(only PT should resolve) Goal: Patient Will Transfer Sit To/From Stand Outcome: Progressing Flowsheets (Taken 03/04/2021 1506) Patient will transfer sit to/from stand:  with supervision  with min guard assist Goal: Pt Will Transfer Bed To Chair/Chair To Bed Outcome: Progressing Flowsheets (Taken 03/04/2021 1506) Pt will Transfer Bed to Chair/Chair to Bed:  with supervision  min guard assist Goal: Pt Will Ambulate Outcome: Progressing Flowsheets (Taken 03/04/2021 1506) Pt will Ambulate:  50 feet  with supervision  with min guard assist  with least restrictive assistive device Goal: Pt/caregiver will Perform Home Exercise Program Outcome: Progressing Flowsheets (Taken 03/04/2021 1506) Pt/caregiver will Perform Home Exercise Program:  For increased strengthening  For improved balance  Independently  3:06 PM, 03/04/21 Mearl Latin PT, DPT Physical Therapist at [facility]

## 2021-03-04 NOTE — Progress Notes (Signed)
PROGRESS NOTE    Kathleen Wright  ERX:[id number] DOB: [birth year] DOA: 02/27/2021 PCP: Cory Munch, PA-C   Brief Narrative:   Kathleen Wright  is a 55 y.o. female, with history of asthma, COPD, hypertrophic cardiomyopathy, paroxysmal atrial fibrillation, and more presents the ED with a chief complaint of dyspnea.  Is been ongoing for 1 week.  Its been getting worse over the week.  She has been admitted for acute hypoxemic respiratory failure that is multifactorial and secondary to acute on chronic diastolic congestive heart failure as well as acute COPD exacerbation related to bronchiectasis/pneumonia.  Assessment & Plan:   Active Problems:   History of atrial fibrillation   Elevated troponin   COPD with acute exacerbation (HCC)   Protein-calorie malnutrition, mild (HCC)   Respiratory failure with hypoxia (HCC)   1-acute respiratory failure with hypoxia secondary to acute on chronic congestive heart failure (diastolic and right heart sided failure), bronchiectasis/pneumonia and COPD exacerbation. -Patient complaining of lower extremity edema, orthopnea, dyspnea on exertion and increased wheezing along with shortness of breath and the need of oxygen supplementation due to new development of hypoxia. -CT angiogram of the chest demonstrated no pulmonary embolism but had bronchial wall thickening and peribronchial nodular infiltrates representing multifocal pneumonia. -2D echo with positive grade 3 diastolic dysfunction, ejection fraction 65 to 70%, no wall motion abnormalities and the presence of low normal right ventricular systolic function. -Will continue treatment with IV diuresis (but dose will be adjusted, as patient currently demonstrating no crackles), continue low-sodium diet, daily weights and strict I's and O's.  Increased to IV 20 mg twice daily on 9/22 with ongoing diuresis noted. -Continue also nebulizer management, IV steroids, mucolytic/antitussive regimen; and IV antibiotics  (Rocephin and Zithromax).  Decreased steroids to 40 mg IV twice daily to assist with delirium on 9/21 -Continue to wean off oxygen supplementation as tolerated; goal is for O2 sats above 90%.  -Continue the use of flutter valve and follow clinical response. -Given increased somnolence ABG has been checked; CO2 mildly elevated for someone with underlying history of COPD.  Oxygen saturation was 81 on patient's ABG; instructions given to nursing staff to decrease oxygen saturation to avoid CO2 retention. -Minimize the use of narcotics and avoid the use of benzodiazepines.  If patient's mentation failed to completely improve my injury requiring intermittent use of CPAP/BiPAP. -PT evaluation ordered   2-History of atrial fibrillation -Rate controlled -Continue the use of Cardizem -Continue the use of Pradaxa for secondary prevention. -Metoprolol has been discontinued in the setting of bradycardia.   3-Elevated troponin -In the setting of demand ischemia secondary to ongoing congestive heart failure exacerbation and pulmonary process. -Patient denies chest pain -No ischemic changes on EKG at time of admission or telemetry evaluation.   4-class II obesity -Body mass index is 37.53 kg/m. -Low calorie diet, portion control and increase physical activity discussed with patient. -Will benefit of outpatient sleep study.   5-gastroesophageal reflux disease -Continue PPI.   6-hospital-acquired delirium-improving -Plan to decrease steroids as this may be contributing a component of psychosis -Hold off on Seroquel for now given some increased somnolence     DVT prophylaxis: Pradaxa Code Status: Full Family Communication: Daughter at bedside 9/21, tried calling daughter 9/22 with no response Disposition Plan:  Status is: Inpatient   Remains inpatient appropriate because:Altered mental status, IV treatments appropriate due to intensity of illness or inability to take PO, and Inpatient level of  care appropriate due to severity of illness   Dispo:  The patient is from: Home              Anticipated d/c is to: Home              Patient currently is not medically stable to d/c.              Difficult to place patient No     Consultants:  None   Procedures:  See below  Antimicrobials:  Anti-infectives (From admission, onward)    Start     Dose/Rate Route Frequency Ordered Stop   03/01/21 0000  cefTRIAXone (ROCEPHIN) 1 g in sodium chloride 0.9 % 100 mL IVPB        1 g 200 mL/hr over 30 Minutes Intravenous Every 24 hours 02/28/21 0259     02/28/21 0000  cefTRIAXone (ROCEPHIN) 1 g in sodium chloride 0.9 % 100 mL IVPB        1 g 200 mL/hr over 30 Minutes Intravenous  Once 02/27/21 2358 02/28/21 0107   02/28/21 0000  azithromycin (ZITHROMAX) 500 mg in sodium chloride 0.9 % 250 mL IVPB        500 mg 250 mL/hr over 60 Minutes Intravenous Every 24 hours 02/27/21 2358         Subjective: Patient seen and evaluated today with no new acute complaints or concerns. She appears to be having improvement in dyspnea, but slowly.  No acute concerns or events noted overnight.  Objective: Vitals:   03/04/21 0212 03/04/21 0624 03/04/21 0916 03/04/21 1059  BP: 121/73     Pulse: 79     Resp: 20     Temp: 98.3 F (36.8 C)     TempSrc: Oral     SpO2: 97%  98% 99%  Weight:  83.1 kg    Height:        Intake/Output Summary (Last 24 hours) at 03/04/2021 1220 Last data filed at 03/04/2021 1154 Gross per 24 hour  Intake 649.54 ml  Output 3200 ml  Net -2550.46 ml   Filed Weights   03/02/21 0425 03/03/21 0500 03/04/21 0624  Weight: 90.1 kg 84.9 kg 83.1 kg    Examination:  General exam: Appears calm and comfortable  Respiratory system: Clear to auscultation. Respiratory effort normal. 2L [place]. Cardiovascular system: S1 & S2 heard, RRR.  Gastrointestinal system: Abdomen is soft Central nervous system: Alert and awake Extremities: Bilateral 1+ edema Skin: No significant lesions  noted Psychiatry: Flat affect.    Data Reviewed: I have personally reviewed following labs and imaging studies  CBC: Recent Labs  Lab 02/27/21 2009 02/28/21 0445 03/01/21 0537 03/03/21 0448 03/04/21 0517  WBC 17.3* 16.6* 19.1* 11.2* 13.6*  NEUTROABS 13.7*  --   --   --   --   HGB 9.8* 9.2* 10.2* 9.2* 10.4*  HCT 32.4* 30.8* 34.0* 30.8* 34.1*  MCV 90.8 93.3 93.9 92.2 92.2  PLT 468* 464* 492* 483* 494*   Basic Metabolic Panel: Recent Labs  Lab 02/28/21 0445 03/01/21 0537 03/02/21 0501 03/03/21 0448 03/04/21 0517  NA 133* 132* 134* 135 135  K 4.8 5.2* 5.1 4.5 4.3  CL 101 98 101 97* 91*  CO2 25 24 25 30  34*  GLUCOSE 200* 178* 164* 140* 148*  BUN 15 28* 32* 29* 24*  CREATININE 0.64 1.23* 0.89 0.79 0.72  CALCIUM 8.4* 8.5* 8.7* 8.6* 8.8*  MG 1.9  --   --   --  2.1   GFR: Estimated Creatinine Clearance: 76.7 mL/min (by C-G formula based  on SCr of 0.72 mg/dL). Liver Function Tests: Recent Labs  Lab 02/27/21 2009 02/28/21 0445  AST 14* 16  ALT 16 16  ALKPHOS 120 108  BILITOT 1.0 0.9  PROT 6.8 6.6  ALBUMIN 3.3* 3.2*   No results for input(s): LIPASE, AMYLASE in the last 168 hours. No results for input(s): AMMONIA in the last 168 hours. Coagulation Profile: No results for input(s): INR, PROTIME in the last 168 hours. Cardiac Enzymes: No results for input(s): CKTOTAL, CKMB, CKMBINDEX, TROPONINI in the last 168 hours. BNP (last 3 results) No results for input(s): PROBNP in the last 8760 hours. HbA1C: No results for input(s): HGBA1C in the last 72 hours. CBG: Recent Labs  Lab 02/28/21 0328 02/28/21 1627  GLUCAP 201* 173*   Lipid Profile: No results for input(s): CHOL, HDL, LDLCALC, TRIG, CHOLHDL, LDLDIRECT in the last 72 hours. Thyroid Function Tests: No results for input(s): TSH, T4TOTAL, FREET4, T3FREE, THYROIDAB in the last 72 hours. Anemia Panel: No results for input(s): VITAMINB12, FOLATE, FERRITIN, TIBC, IRON, RETICCTPCT in the last 72 hours. Sepsis  Labs: No results for input(s): PROCALCITON, LATICACIDVEN in the last 168 hours.  Recent Results (from the past 240 hour(s))  Resp Panel by RT-PCR (Flu A&B, Covid) Nasopharyngeal Swab     Status: None   Collection Time: 02/27/21  8:28 PM   Specimen: Nasopharyngeal Swab; Nasopharyngeal(NP) swabs in vial transport medium  Result Value Ref Range Status   SARS Coronavirus 2 by RT PCR NEGATIVE NEGATIVE Final    Comment: (NOTE) SARS-CoV-2 target nucleic acids are NOT DETECTED.  The SARS-CoV-2 RNA is generally detectable in upper respiratory specimens during the acute phase of infection. The lowest concentration of SARS-CoV-2 viral copies this assay can detect is 138 copies/mL. A negative result does not preclude SARS-Cov-2 infection and should not be used as the sole basis for treatment or other patient management decisions. A negative result may occur with  improper specimen collection/handling, submission of specimen other than nasopharyngeal swab, presence of viral mutation(s) within the areas targeted by this assay, and inadequate number of viral copies(<138 copies/mL). A negative result must be combined with clinical observations, patient history, and epidemiological information. The expected result is Negative.  Fact Sheet for Patients:  EntrepreneurPulse.com.au  Fact Sheet for Healthcare Providers:  IncredibleEmployment.be  This test is no t yet approved or cleared by the [place] FDA and  has been authorized for detection and/or diagnosis of SARS-CoV-2 by FDA under an Emergency Use Authorization (EUA). This EUA will remain  in effect (meaning this test can be used) for the duration of the COVID-19 declaration under Section 564(b)(1) of the Act, 21 U.S.C.section 360bbb-3(b)(1), unless the authorization is terminated  or revoked sooner.       Influenza A by PCR NEGATIVE NEGATIVE Final   Influenza B by PCR NEGATIVE NEGATIVE Final     Comment: (NOTE) The Xpert Xpress SARS-CoV-2/FLU/RSV plus assay is intended as an aid in the diagnosis of influenza from Nasopharyngeal swab specimens and should not be used as a sole basis for treatment. Nasal washings and aspirates are unacceptable for Xpert Xpress SARS-CoV-2/FLU/RSV testing.  Fact Sheet for Patients: EntrepreneurPulse.com.au  Fact Sheet for Healthcare Providers: IncredibleEmployment.be  This test is not yet approved or cleared by the [place] FDA and has been authorized for detection and/or diagnosis of SARS-CoV-2 by FDA under an Emergency Use Authorization (EUA). This EUA will remain in effect (meaning this test can be used) for the duration of the COVID-19 declaration under Section  564(b)(1) of the Act, 21 U.S.C. section 360bbb-3(b)(1), unless the authorization is terminated or revoked.  Performed at [facility], [address] [place], [place] [address]   Blood culture (routine x 2)     Status: None (Preliminary result)   Collection Time: 02/28/21  1:25 AM   Specimen: Right Antecubital; Blood  Result Value Ref Range Status   Specimen Description RIGHT ANTECUBITAL  Final   Special Requests   Final    BOTTLES DRAWN AEROBIC AND ANAEROBIC Blood Culture adequate volume   Culture   Final    NO GROWTH 4 DAYS Performed at [facility], [address], [place], [place] [address]    Report Status PENDING  Incomplete  Blood culture (routine x 2)     Status: None (Preliminary result)   Collection Time: 02/28/21  4:22 AM   Specimen: BLOOD LEFT HAND  Result Value Ref Range Status   Specimen Description   Final    BLOOD LEFT HAND BOTTLES DRAWN AEROBIC AND ANAEROBIC   Special Requests Blood Culture adequate volume  Final   Culture   Final    NO GROWTH 4 DAYS Performed at [facility], [address], [place], [place] [address]    Report Status PENDING  Incomplete  Culture, Respiratory w Gram Stain     Status: None    Collection Time: 02/28/21  2:34 PM   Specimen: SPU  Result Value Ref Range Status   Specimen Description   Final    SPUTUM Performed at [facility], [address] [place], [place] [address]    Special Requests   Final    NONE Performed at [facility], [address], [place], [place] [address]    Gram Stain RARE WBC PRESENT, PREDOMINANTLY PMN FEW YEAST   Final   Culture   Final    ABUNDANT Normal respiratory flora-no Staph aureus or Pseudomonas seen Performed at [facility] [address] [place], [place] [address]    Report Status 03/03/2021 FINAL  Final         Radiology Studies: No results found.      Scheduled Meds:  arformoterol  15 mcg Nebulization BID   aspirin EC  81 mg Oral Daily   budesonide (PULMICORT) nebulizer solution  0.5 mg Nebulization BID   dabigatran  150 mg Oral BID   dextromethorphan-guaiFENesin  1 tablet Oral BID   diltiazem  180 mg Oral Daily   furosemide  20 mg Intravenous Q12H   [START ON 03/05/2021] influenza vac split quadrivalent PF  0.5 mL Intramuscular Tomorrow-1000   ipratropium-albuterol  3 mL Nebulization BID   methylPREDNISolone (SOLU-MEDROL) injection  40 mg Intravenous Q12H   pantoprazole  40 mg Oral Daily   [START ON 03/05/2021] pneumococcal 23 valent vaccine  0.5 mL Intramuscular Tomorrow-1000   revefenacin  175 mcg Nebulization Daily   senna  1 tablet Oral Daily   Continuous Infusions:  sodium chloride 250 mL (03/03/21 2056)   azithromycin 500 mg (03/04/21 0217)   cefTRIAXone (ROCEPHIN)  IV 1 g (03/03/21 2201)     LOS: 4 days    Time spent: 35 minutes    Phillips Goulette Darleen Crocker, DO [facility]  If 7PM-7AM, please contact night-coverage [email or web address] 03/04/2021, 12:20 PM

## 2021-03-04 NOTE — Evaluation (Signed)
Physical Therapy Evaluation Patient Details Name: Kathleen Wright MRN: [id number] DOB: Jan 23, 1966 Today's Date: 03/04/2021  History of Present Illness  Kathleen Wright  is a 55 y.o. female, with history of asthma, COPD, hypertrophic cardiomyopathy, paroxysmal atrial fibrillation, and more presents the ED with a chief complaint of dyspnea.  Is been ongoing for 1 week.  Its been getting worse over the week.  She reports that it is worse with exertion, she also has orthopnea.  She reports that she has had orthopnea since she had open heart surgery.  That part is not new.  She has peripheral edema with left greater than right.  That has been going on for longer than 1 week.  She reports a productive cough with green/white/brown [place].  This is a change in color as her normal sputum is clear or white.  She denies any fevers.  She has tried albuterol and ipratropium 2 times today without relief prior to presenting to the ED.  She reports a chest tightness that is diffuse across her chest.  That is been going on for 2 days.  It is also worse with exertion.  She does not require oxygen at baseline, but she is requiring oxygen today.  Patient reports that she has no other complaints at this time.   Clinical Impression  Patient limited for functional mobility as stated below secondary to BLE weakness, fatigue and impaired standing balance. Patient requires min guard/assist with mobility today secondary to weakness and balance deficits. Patient O2 sat monitored throughout session which decreases to 86% with coughing and again with mobility on 3L with minimal SOB which returns into 90s at rest. Spoke with patient, daughter, and granddaughter about d/c disposition and they feel they can assist patient with the necessary level required for safety upon return home. Patient will benefit from continued physical therapy in hospital and recommended venue below to increase strength, balance, endurance for safe ADLs and gait.         Recommendations for follow up therapy are one component of a multi-disciplinary discharge planning process, led by the attending physician.  Recommendations may be updated based on patient status, additional functional criteria and insurance authorization.  Follow Up Recommendations Home health PT;Supervision for mobility/OOB;Supervision/Assistance - 24 hour    Equipment Recommendations  None recommended by PT    Recommendations for Other Services       Precautions / Restrictions Precautions Precautions: Fall Restrictions Weight Bearing Restrictions: No      Mobility  Bed Mobility Overal bed mobility: Modified Independent             General bed mobility comments: labored transition to seated EOB with HOB elevated    Transfers Overall transfer level: Needs assistance Equipment used: 1 person hand held assist Transfers: Sit to/from [facility] Sit to Stand: Min guard Stand pivot transfers: Min guard          Ambulation/Gait Ambulation/Gait assistance: Min guard;Min assist Gait Distance (Feet): 20 Feet Assistive device: 1 person hand held assist   Gait velocity: decreased   General Gait Details: labored, unsteady cadence with HHA  Stairs            Wheelchair Mobility    Modified Rankin (Stroke Patients Only)       Balance Overall balance assessment: Needs assistance Sitting-balance support: Feet supported Sitting balance-Leahy Scale: Good Sitting balance - Comments: seated EOB   Standing balance support: Single extremity supported Standing balance-Leahy Scale: Fair Standing balance comment: HHA and reaching for  walls/objects for support                             Pertinent Vitals/Pain Pain Assessment: 0-10 Pain Score: 8  Pain Location: back Pain Descriptors / Indicators: Sore Pain Intervention(s): Limited activity within patient's tolerance;Monitored during session    Home Living Family/patient  expects to be discharged to:: Private residence Living Arrangements: Alone Available Help at Discharge: Family Type of Home: House Home Access: Stairs to enter Entrance Stairs-Rails: Right Entrance Stairs-Number of Steps: 5 Home Layout: One level Home Equipment: Walker - 2 wheels;Grab bars - tub/shower;Shower seat      Prior Function Level of Independence: Needs assistance   Gait / Transfers Assistance Needed: household and short distance community ambulator  ADL's / Homemaking Assistance Needed: independent with basic ADL, family assist PRN        Hand Dominance        Extremity/Trunk Assessment   Upper Extremity Assessment Upper Extremity Assessment: Generalized weakness    Lower Extremity Assessment Lower Extremity Assessment: Generalized weakness    Cervical / Trunk Assessment Cervical / Trunk Assessment: Normal  Communication   Communication: No difficulties  Cognition Arousal/Alertness: Awake/alert Behavior During Therapy: WFL for tasks assessed/performed Overall Cognitive Status: Within Functional Limits for tasks assessed                                        General Comments      Exercises     Assessment/Plan    PT Assessment Patient needs continued PT services  PT Problem List Decreased strength;Decreased mobility;Decreased activity tolerance;Decreased balance;Cardiopulmonary status limiting activity       PT Treatment Interventions DME instruction;Therapeutic exercise;Gait training;Balance training;Stair training;Neuromuscular re-education;Functional mobility training;Therapeutic activities;Patient/family education    PT Goals (Current goals can be found in the Care Plan section)  Acute Rehab PT Goals Patient Stated Goal: return home PT Goal Formulation: With patient Time For Goal Achievement: 03/18/21 Potential to Achieve Goals: Good    Frequency Min 3X/week   Barriers to discharge        Co-evaluation                AM-PAC PT "6 Clicks" Mobility  Outcome Measure Help needed turning from your back to your side while in a flat bed without using bedrails?: None Help needed moving from lying on your back to sitting on the side of a flat bed without using bedrails?: A Little Help needed moving to and from a bed to a chair (including a wheelchair)?: A Little Help needed standing up from a chair using your arms (e.g., wheelchair or bedside chair)?: A Little Help needed to walk in hospital room?: A Little Help needed climbing 3-5 steps with a railing? : A Lot 6 Click Score: 18    End of Session Equipment Utilized During Treatment: Oxygen Activity Tolerance: Patient tolerated treatment well;Patient limited by fatigue Patient left: in bed;with family/visitor present;with call bell/phone within reach Nurse Communication: Mobility status PT Visit Diagnosis: Unsteadiness on feet (R26.81);Other abnormalities of gait and mobility (R26.89);Muscle weakness (generalized) (M62.81)    Time: 1413-1440 PT Time Calculation (min) (ACUTE ONLY): 27 min   Charges:   PT Evaluation $PT Eval Low Complexity: 1 Low PT Treatments $Therapeutic Activity: 8-22 mins $Self Care/Home Management: 8-22        3:05 PM, 03/04/21 Mearl Latin PT,  DPT Physical Therapist at [facility]

## 2021-03-04 NOTE — Progress Notes (Signed)
   03/04/21 0212  Assess: MEWS Score  Temp 98.3 F (36.8 C)  BP 121/73  Pulse Rate 79  Resp 20  SpO2 97 %  O2 Device Nasal Cannula  O2 Flow Rate (L/min) 3 L/min  Assess: MEWS Score  MEWS Temp 0  MEWS Systolic 0  MEWS Pulse 0  MEWS RR 0  MEWS LOC 0  MEWS Score 0  MEWS Score Color Green  Assess: if the MEWS score is Yellow or Red  Were vital signs taken at a resting state? Yes  Focused Assessment Change from prior assessment (see assessment flowsheet)  Early Detection of Sepsis Score *See Row Information* Low  MEWS guidelines implemented *See Row Information* No, previously yellow, continue vital signs every 4 hours  Treat  Pain Scale 0-10  Notify: Charge Nurse/RN  Name of Charge Nurse/RN Notified Letina  Date Charge Nurse/RN Notified 03/04/21  Time Charge Nurse/RN Notified 0215  Document  Patient Outcome Stabilized after interventions  Progress note created (see row info) Yes

## 2021-03-04 NOTE — TOC Progression Note (Signed)
Transition of Care [facility]) - Progression Note    Patient Details  Name: Kathleen Wright MRN: [id number] Date of Birth: [birth year]  Transition of Care [facility]) CM/SW Contact  Salome Arnt, [place] Phone Number: 03/04/2021, 3:29 PM  Clinical Narrative:  LCSW notified by RN that pt is interested in SNF. PT evaluated pt this afternoon and recommend home health. LCSW discussed with pt and pt's granddaughter with pt's permission. Pt's granddaughter feels pt will need rehab with possible new home O2 and family unable to be with pt around the clock. Educated pt and granddaughter on qualifying skilled needs. PT agreed to see pt tomorrow to determine if home health remains appropriate.       Expected Discharge Plan: Home/Self Care Barriers to Discharge: Continued Medical Work up  Expected Discharge Plan and Services Expected Discharge Plan: Home/Self Care In-house Referral: Clinical Social Work     Living arrangements for the past 2 months: Single Family Home                 DME Arranged: N/A                     Social Determinants of Health (SDOH) Interventions    Readmission Risk Interventions No flowsheet data found.

## 2021-03-04 NOTE — Progress Notes (Signed)
Did ECG for patient.  Had critical, report given to Izora Gala, Therapist, sports.

## 2021-03-04 NOTE — Progress Notes (Signed)
   03/04/21 1700  Vitals  BP (!) 149/92  MAP (mmHg) 110  Pulse Rate (!) 114  Pulse Rate Source Monitor  Resp 19  Oxygen Therapy  SpO2 96 %  O2 Device Nasal Cannula  O2 Flow Rate (L/min) 3 L/min

## 2021-03-05 LAB — CULTURE, BLOOD (ROUTINE X 2)
Culture: NO GROWTH
Culture: NO GROWTH
Special Requests: ADEQUATE
Special Requests: ADEQUATE

## 2021-03-05 LAB — BASIC METABOLIC PANEL
Anion gap: 10 (ref 5–15)
BUN: 24 mg/dL — ABNORMAL HIGH (ref 6–20)
CO2: 35 mmol/L — ABNORMAL HIGH (ref 22–32)
Calcium: 8.6 mg/dL — ABNORMAL LOW (ref 8.9–10.3)
Chloride: 89 mmol/L — ABNORMAL LOW (ref 98–111)
Creatinine, Ser: 0.73 mg/dL (ref 0.44–1.00)
GFR, Estimated: >60 mL/min (ref >60)
Glucose, Bld: 145 mg/dL — ABNORMAL HIGH (ref 70–99)
Potassium: 4 mmol/L (ref 3.5–5.1)
Sodium: 134 mmol/L — ABNORMAL LOW (ref 135–145)

## 2021-03-05 LAB — CBC
HCT: 33.3 % — ABNORMAL LOW (ref 36.0–46.0)
Hemoglobin: 10.2 g/dL — ABNORMAL LOW (ref 12.0–15.0)
MCH: 27.1 pg (ref 26.0–34.0)
MCHC: 30.6 g/dL (ref 30.0–36.0)
MCV: 88.6 fL (ref 80.0–100.0)
Platelets: 494 10*3/uL — ABNORMAL HIGH (ref 150–400)
RBC: 3.76 MIL/uL — ABNORMAL LOW (ref 3.87–5.11)
RDW: 17.1 % — ABNORMAL HIGH (ref 11.5–15.5)
WBC: 12.4 10*3/uL — ABNORMAL HIGH (ref 4.0–10.5)
nRBC: 0 % (ref 0.0–0.2)

## 2021-03-05 LAB — MAGNESIUM: Magnesium: 2 mg/dL (ref 1.7–2.4)

## 2021-03-05 MED ORDER — FUROSEMIDE 10 MG/ML IJ SOLN
40.0000 mg | Freq: Two times a day (BID) | INTRAMUSCULAR | Status: DC
  Administered 2021-03-05 – 2021-03-07 (×4): 40 mg via INTRAVENOUS
  Filled 2021-03-05 (×4): qty 4

## 2021-03-05 MED ORDER — NYSTATIN 100000 UNIT/ML MT SUSP
5.0000 mL | Freq: Four times a day (QID) | OROMUCOSAL | Status: DC
  Administered 2021-03-05 – 2021-03-09 (×15): 500000 [IU] via ORAL
  Filled 2021-03-05 (×15): qty 5

## 2021-03-05 NOTE — NC FL2 (Signed)
[place] LEVEL OF CARE SCREENING TOOL     IDENTIFICATION  Patient Name: Kathleen Wright Birthdate: May 07, 1966 Sex: female Admission Date (Current Location): 02/27/2021  [facility] and [place] Number:  [employer] and Address:  [facility] [address] [place]      Provider Number: [id number]  Attending Physician Name and Address:  Rodena Goldmann, DO  Relative Name and Phone Number:       Current Level of Care: Hospital Recommended Level of Care: [place] Prior Approval Number:    Date Approved/Denied:   PASRR Number: [id number]  Discharge Plan: SNF    Current Diagnoses: Patient Active Problem List   Diagnosis Date Noted   COPD with acute exacerbation ([place]) 02/28/2021   Protein-calorie malnutrition, mild ([place]) 02/28/2021   Respiratory failure with hypoxia ([place]) 02/28/2021   Left carotid bruit 08/27/2015   COPD exacerbation ([place]) 08/10/2015   PAF (paroxysmal atrial fibrillation) ([place]) 08/10/2015   Hypertrophic obstructive cardiomyopathy ([place]) 06/10/2015   Atypical atrial flutter ([place]) 06/10/2015   History of atrial fibrillation 06/10/2015   Elevated troponin 06/10/2015   COPD (chronic obstructive pulmonary disease) ([place]) 06/10/2015   Tobacco abuse 06/10/2015   Chest pain 06/09/2015   History of ventricular septal myectomy 06/09/2015   Palpitations 06/09/2015   H N P-LUMBAR 09/17/2008   DEGENERATIVE DISC DISEASE, LUMBAR SPINE 09/17/2008   HIGH BLOOD PRESSURE 09/17/2008    Orientation RESPIRATION BLADDER Height & Weight     Self, Time, Situation, Place  O2 (see dc summary) Continent Weight: 180 lb 1.9 oz (81.7 kg) Height:  5\' 1"  (154.9 cm)  BEHAVIORAL SYMPTOMS/MOOD NEUROLOGICAL BOWEL NUTRITION STATUS      Continent Diet (see dc summary)  AMBULATORY STATUS COMMUNICATION OF NEEDS Skin   Extensive Assist Verbally Normal                       Personal Care Assistance Level of Assistance   Bathing, Feeding, Dressing Bathing Assistance: Limited assistance Feeding assistance: Independent Dressing Assistance: Limited assistance     Functional Limitations Info  Sight, Hearing, Speech Sight Info: Adequate Hearing Info: Adequate Speech Info: Adequate    SPECIAL CARE FACTORS FREQUENCY  PT (By licensed PT), OT (By licensed OT)     PT Frequency: 3-5x week OT Frequency: 3x week            Contractures Contractures Info: Not present    Additional Factors Info  Code Status, Allergies Code Status Info: Full Allergies Info: Amiodarone           Current Medications (03/05/2021):  This is the current hospital active medication list Current Facility-Administered Medications  Medication Dose Route Frequency Provider Last Rate Last Admin   0.9 %  sodium chloride infusion   Intravenous PRN Manuella Ghazi, Pratik D, DO 5 mL/hr at 03/03/21 2056 250 mL at 03/03/21 2056   acetaminophen (TYLENOL) tablet 650 mg  650 mg Oral Q6H PRN Zierle-Ghosh, Asia B, DO   650 mg at 03/05/21 6606   Or   acetaminophen (TYLENOL) suppository 650 mg  650 mg Rectal Q6H PRN Zierle-Ghosh, Asia B, DO       albuterol (PROVENTIL) (2.5 MG/3ML) 0.083% nebulizer solution 2.5 mg  2.5 mg Nebulization Q2H PRN Zierle-Ghosh, Asia B, DO       arformoterol (BROVANA) nebulizer solution 15 mcg  15 mcg Nebulization BID Barton Dubois, MD   15 mcg at 03/05/21 0820   aspirin EC tablet 81  mg  81 mg Oral Daily Zierle-Ghosh, Asia B, DO   81 mg at 03/05/21 1024   budesonide (PULMICORT) nebulizer solution 0.5 mg  0.5 mg Nebulization BID Zierle-Ghosh, Asia B, DO   0.5 mg at 03/05/21 0809   cefTRIAXone (ROCEPHIN) 1 g in sodium chloride 0.9 % 100 mL IVPB  1 g Intravenous Q24H Zierle-Ghosh, Asia B, DO 200 mL/hr at 03/05/21 0002 1 g at 03/05/21 0002   dabigatran (PRADAXA) capsule 150 mg  150 mg Oral BID Zierle-Ghosh, Asia B, DO   150 mg at 03/05/21 1025   dextromethorphan-guaiFENesin (MUCINEX DM) 30-600 MG per 12 hr tablet 1 tablet  1  tablet Oral BID Barton Dubois, MD   1 tablet at 03/05/21 1027   diltiazem (CARDIZEM CD) 24 hr capsule 180 mg  180 mg Oral Daily Zierle-Ghosh, Asia B, DO   180 mg at 03/05/21 1026   diltiazem (CARDIZEM) tablet 30 mg  30 mg Oral Q4H PRN Zierle-Ghosh, Asia B, DO   30 mg at 03/04/21 1758   furosemide (LASIX) injection 40 mg  40 mg Intravenous Q12H Shah, Pratik D, DO       influenza vac split quadrivalent PF (FLUARIX) injection 0.5 mL  0.5 mL Intramuscular Tomorrow-1000 Shah, Pratik D, DO       ipratropium-albuterol (DUONEB) 0.5-2.5 (3) MG/3ML nebulizer solution 3 mL  3 mL Nebulization BID Barton Dubois, MD   3 mL at 03/05/21 0804   methylPREDNISolone sodium succinate (SOLU-MEDROL) 40 mg/mL injection 40 mg  40 mg Intravenous Q12H Shah, Pratik D, DO   40 mg at 03/05/21 0523   ondansetron (ZOFRAN) tablet 4 mg  4 mg Oral Q6H PRN Zierle-Ghosh, Asia B, DO   4 mg at 03/02/21 1241   Or   ondansetron (ZOFRAN) injection 4 mg  4 mg Intravenous Q6H PRN Zierle-Ghosh, Asia B, DO   4 mg at 03/03/21 1742   oxyCODONE (Oxy IR/ROXICODONE) immediate release tablet 5 mg  5 mg Oral Q6H PRN Barton Dubois, MD   5 mg at 03/04/21 0626   pantoprazole (PROTONIX) EC tablet 40 mg  40 mg Oral Daily Zierle-Ghosh, Asia B, DO   40 mg at 03/05/21 1027   pneumococcal 23 valent vaccine (PNEUMOVAX-23) injection 0.5 mL  0.5 mL Intramuscular Tomorrow-1000 Manuella Ghazi, Pratik D, DO       prochlorperazine (COMPAZINE) injection 10 mg  10 mg Intravenous Q6H PRN Barton Dubois, MD       revefenacin (YUPELRI) nebulizer solution 175 mcg  175 mcg Nebulization Daily Barton Dubois, MD   175 mcg at 03/05/21 1148   senna (SENOKOT) tablet 8.6 mg  1 tablet Oral Daily Manuella Ghazi, Pratik D, DO   8.6 mg at 03/05/21 1026     Discharge Medications: Please see discharge summary for a list of discharge medications.  Relevant Imaging Results:  Relevant Lab Results:   Additional Information SSN: [place], LCSW

## 2021-03-05 NOTE — Progress Notes (Signed)
Physical Therapy Treatment Patient Details Name: Kathleen Wright MRN: [id number] DOB: May 11, 1966 Today's Date: 03/05/2021   History of Present Illness Kathleen Wright  is a 55 y.o. female, with history of asthma, COPD, hypertrophic cardiomyopathy, paroxysmal atrial fibrillation, and more presents the ED with a chief complaint of dyspnea.  Is been ongoing for 1 week.  Its been getting worse over the week.  She reports that it is worse with exertion, she also has orthopnea.  She reports that she has had orthopnea since she had open heart surgery.  That part is not new.  She has peripheral edema with left greater than right.  That has been going on for longer than 1 week.  She reports a productive cough with green/white/brown [place].  This is a change in color as her normal sputum is clear or white.  She denies any fevers.  She has tried albuterol and ipratropium 2 times today without relief prior to presenting to the ED.  She reports a chest tightness that is diffuse across her chest.  That is been going on for 2 days.  It is also worse with exertion.  She does not require oxygen at baseline, but she is requiring oxygen today.  Patient reports that she has no other complaints at this time.    PT Comments    Patient does not require assist for bed mobility with HOB elevated. She tranfers to standing with RW without assist. Patient demonstrates good balance with use of RW standing at bedside and ambulates in room with RW without loss of balance. Patient limited to 2 bouts of ambulating about 10 feet secondary to SOB and fatigue. O2 sat on 3L at 90-93% at rest and decreased to 86-88% with ambulation. Patient returned to bed at end of session. Patient will benefit from continued physical therapy in hospital and recommended venue below to increase strength, balance, endurance for safe ADLs and gait.     Recommendations for follow up therapy are one component of a multi-disciplinary discharge planning process, led  by the attending physician.  Recommendations may be updated based on patient status, additional functional criteria and insurance authorization.  Follow Up Recommendations  Home health PT;Supervision - Intermittent     Equipment Recommendations  3in1 (PT)    Recommendations for Other Services       Precautions / Restrictions Precautions Precautions: Fall Restrictions Weight Bearing Restrictions: No     Mobility  Bed Mobility Overal bed mobility: Modified Independent             General bed mobility comments: transition to seated EOB with HOB elevated    Transfers Overall transfer level: Modified independent Equipment used: Rolling walker (2 wheeled) Transfers: Sit to/from [facility] Sit to Stand: Modified independent (Device/Increase time) Stand pivot transfers: Modified independent (Device/Increase time)       General transfer comment: transfer to standing with RW, no assist required, slightly slow pivots  Ambulation/Gait Ambulation/Gait assistance: Modified independent (Device/Increase time);Supervision Gait Distance (Feet): 20 Feet Assistive device: Rolling walker (2 wheeled) Gait Pattern/deviations: Step-through pattern;Decreased stride length Gait velocity: decreased   General Gait Details: slow, labored cadence with RW; ambulates 2 bouts of 10 feet with rest break in between, no loss of balance; limited by fatigue   Stairs             Wheelchair Mobility    Modified Rankin (Stroke Patients Only)       Balance Overall balance assessment: Needs assistance Sitting-balance support: Feet supported Sitting  balance-Leahy Scale: Good Sitting balance - Comments: seated EOB   Standing balance support: Single extremity supported Standing balance-Leahy Scale: Good Standing balance comment: with RW                            Cognition Arousal/Alertness: Awake/alert Behavior During Therapy: WFL for tasks  assessed/performed Overall Cognitive Status: Within Functional Limits for tasks assessed                                        Exercises      General Comments        Pertinent Vitals/Pain Pain Assessment: Faces Faces Pain Scale: Hurts a little bit Pain Location: back Pain Descriptors / Indicators: Sore Pain Intervention(s): Limited activity within patient's tolerance;Monitored during session;Repositioned    Home Living                      Prior Function            PT Goals (current goals can now be found in the care plan section) Acute Rehab PT Goals Patient Stated Goal: return home PT Goal Formulation: With patient Time For Goal Achievement: 03/18/21 Potential to Achieve Goals: Good Progress towards PT goals: Progressing toward goals    Frequency    Min 3X/week      PT Plan Current plan remains appropriate;Equipment recommendations need to be updated    Co-evaluation              AM-PAC PT "6 Clicks" Mobility   Outcome Measure  Help needed turning from your back to your side while in a flat bed without using bedrails?: None Help needed moving from lying on your back to sitting on the side of a flat bed without using bedrails?: None Help needed moving to and from a bed to a chair (including a wheelchair)?: A Little Help needed standing up from a chair using your arms (e.g., wheelchair or bedside chair)?: A Little Help needed to walk in hospital room?: A Little Help needed climbing 3-5 steps with a railing? : A Lot 6 Click Score: 19    End of Session Equipment Utilized During Treatment: Oxygen Activity Tolerance: Patient tolerated treatment well;Patient limited by fatigue Patient left: in bed;with call bell/phone within reach Nurse Communication: Mobility status PT Visit Diagnosis: Unsteadiness on feet (R26.81);Other abnormalities of gait and mobility (R26.89);Muscle weakness (generalized) (M62.81)     Time:  3151-7616 PT Time Calculation (min) (ACUTE ONLY): 20 min  Charges:  $Therapeutic Activity: 8-22 mins                     10:07 AM, 03/05/21 Mearl Latin PT, DPT Physical Therapist at [facility]

## 2021-03-05 NOTE — Care Management Important Message (Signed)
Important Message  Patient Details  Name: Kathleen Wright MRN: [id number] Date of Birth: [birth year]   Medicare Important Message Given:  Yes     Tommy Medal 03/05/2021, 11:56 AM

## 2021-03-05 NOTE — TOC Progression Note (Signed)
Transition of Care [facility]) - Progression Note    Patient Details  Name: Kathleen Wright MRN: [id number] Date of Birth: 1966/04/14  [facility] CM/SW Contact  Shade Flood, LCSW Phone Number: 03/05/2021, 1:19 PM  Clinical Narrative:     TOC following. PT continuing to recommend HHPT at dc. Spoke with pt about this recommendation. She states that she prefers to go to SNF rehab short term. Informed pt that TOC can submit for insurance auth requesting SNF rehab based on PT and medical needs but that if insurance denies then her options will be to either pay privately for SNF or to return home with [facility]. Pt expressed understanding and asks TOC to pursue SNF placement/insurance auth.  Reviewed CMS provider options for SNF with pt and will refer as requested. MD anticipating pt will be medically ready for dc early next week. TOC CMA to initiate insurance auth.  TOC will follow.  Expected Discharge Plan: [place] Barriers to Discharge: Continued Medical Work up  Expected Discharge Plan and Services Expected Discharge Plan: [place] In-house Referral: Clinical Social Work   Post Acute Care Choice: [place] Living arrangements for the past 2 months: Single Family Home                 DME Arranged: N/A                     Social Determinants of Health (SDOH) Interventions    Readmission Risk Interventions No flowsheet data found.

## 2021-03-05 NOTE — Progress Notes (Signed)
Patient reported soreness to throat, noted white areas on tongue. MD Manuella Ghazi made aware.

## 2021-03-05 NOTE — Progress Notes (Signed)
PROGRESS NOTE    Kathleen Wright  KWI:[id number] DOB: 1965/11/16 DOA: 02/27/2021 PCP: Cory Munch, PA-C   Brief Narrative:   Kathleen Wright  is a 55 y.o. female, with history of asthma, COPD, hypertrophic cardiomyopathy, paroxysmal atrial fibrillation, and more presents the ED with a chief complaint of dyspnea.  Is been ongoing for 1 week.  Its been getting worse over the week.  She has been admitted for acute hypoxemic respiratory failure that is multifactorial and secondary to acute on chronic diastolic congestive heart failure as well as acute COPD exacerbation related to bronchiectasis/pneumonia.  Assessment & Plan:   Active Problems:   History of atrial fibrillation   Elevated troponin   COPD with acute exacerbation (HCC)   Protein-calorie malnutrition, mild (HCC)   Respiratory failure with hypoxia (HCC)   1-acute respiratory failure with hypoxia secondary to acute on chronic congestive heart failure (diastolic and right heart sided failure), bronchiectasis/pneumonia and COPD exacerbation. -Patient complaining of lower extremity edema, orthopnea, dyspnea on exertion and increased wheezing along with shortness of breath and the need of oxygen supplementation due to new development of hypoxia. -CT angiogram of the chest demonstrated no pulmonary embolism but had bronchial wall thickening and peribronchial nodular infiltrates representing multifocal pneumonia. -2D echo with positive grade 3 diastolic dysfunction, ejection fraction 65 to 70%, no wall motion abnormalities and the presence of low normal right ventricular systolic function. -Will continue treatment with IV diuresis (but dose will be adjusted, as patient currently demonstrating no crackles), continue low-sodium diet, daily weights and strict I's and O's.  Increased to IV 40 mg twice daily on 9/23 with ongoing edema noted. -Continue also nebulizer management, IV steroids, mucolytic/antitussive regimen; and IV antibiotics  (Rocephin and Zithromax).  Decreased steroids to 40 mg IV twice daily to assist with delirium on 9/21 -Continue to wean off oxygen supplementation as tolerated; goal is for O2 sats above 90%.  -Continue the use of flutter valve and follow clinical response. -Given increased somnolence ABG has been checked; CO2 mildly elevated for someone with underlying history of COPD.  Oxygen saturation was 81 on patient's ABG; instructions given to nursing staff to decrease oxygen saturation to avoid CO2 retention. -Minimize the use of narcotics and avoid the use of benzodiazepines.  If patient's mentation failed to completely improve my injury requiring intermittent use of CPAP/BiPAP. -PT evaluation ordered with HHPT recommendation   2-History of atrial fibrillation -Rate controlled -Continue the use of Cardizem -Continue the use of Pradaxa for secondary prevention. -Metoprolol has been discontinued in the setting of bradycardia.   3-Elevated troponin -In the setting of demand ischemia secondary to ongoing congestive heart failure exacerbation and pulmonary process. -Patient denies chest pain -No ischemic changes on EKG at time of admission or telemetry evaluation.   4-class II obesity -Body mass index is 37.53 kg/m. -Low calorie diet, portion control and increase physical activity discussed with patient. -Will benefit of outpatient sleep study.   5-gastroesophageal reflux disease -Continue PPI.   6-hospital-acquired delirium-improving -Plan to decrease steroids as this may be contributing a component of psychosis -Hold off on Seroquel for now given some increased somnolence     DVT prophylaxis: Pradaxa Code Status: Full Family Communication: Daughter on phone 9/23 Disposition Plan:  Status is: Inpatient   Remains inpatient appropriate because:Altered mental status, IV treatments appropriate due to intensity of illness or inability to take PO, and Inpatient level of care appropriate due to  severity of illness   Dispo: The patient is from:  Home              Anticipated d/c is to: Home              Patient currently is not medically stable to d/c.              Difficult to place patient No     Consultants:  None   Procedures:  See below  Antimicrobials:  Anti-infectives (From admission, onward)    Start     Dose/Rate Route Frequency Ordered Stop   03/01/21 0000  cefTRIAXone (ROCEPHIN) 1 g in sodium chloride 0.9 % 100 mL IVPB        1 g 200 mL/hr over 30 Minutes Intravenous Every 24 hours 02/28/21 0259     02/28/21 0000  cefTRIAXone (ROCEPHIN) 1 g in sodium chloride 0.9 % 100 mL IVPB        1 g 200 mL/hr over 30 Minutes Intravenous  Once 02/27/21 2358 02/28/21 0107   02/28/21 0000  azithromycin (ZITHROMAX) 500 mg in sodium chloride 0.9 % 250 mL IVPB  Status:  Discontinued        500 mg 250 mL/hr over 60 Minutes Intravenous Every 24 hours 02/27/21 2358 03/04/21 1251      Subjective: Patient seen and evaluated today with no new acute complaints or concerns; she's showing slow improvement in mentation and dyspnea. No acute concerns or events noted overnight.  Objective: Vitals:   03/05/21 0542 03/05/21 0806 03/05/21 0811 03/05/21 0821  BP: (!) 150/84 (!) 145/80    Pulse: 77 90    Resp: 20 20    Temp: 98.9 F (37.2 C) 98.7 F (37.1 C)    TempSrc: Oral Oral    SpO2: 95% 94% 100% 100%  Weight:      Height:        Intake/Output Summary (Last 24 hours) at 03/05/2021 1043 Last data filed at 03/05/2021 0900 Gross per 24 hour  Intake 960 ml  Output 1450 ml  Net -490 ml   Filed Weights   03/03/21 0500 03/04/21 0624 03/05/21 0529  Weight: 84.9 kg 83.1 kg 81.7 kg    Examination:  General exam: Appears calm and comfortable  Respiratory system: Clear to auscultation. Respiratory effort normal. On [place]. Cardiovascular system: S1 & S2 heard, RRR.  Gastrointestinal system: Abdomen is soft Central nervous system: Alert and awake Extremities: Bilateral edema  2+ Skin: No significant lesions noted Psychiatry: Flat affect.    Data Reviewed: I have personally reviewed following labs and imaging studies  CBC: Recent Labs  Lab 02/27/21 2009 02/28/21 0445 03/01/21 0537 03/03/21 0448 03/04/21 0517 03/05/21 0508  WBC 17.3* 16.6* 19.1* 11.2* 13.6* 12.4*  NEUTROABS 13.7*  --   --   --   --   --   HGB 9.8* 9.2* 10.2* 9.2* 10.4* 10.2*  HCT 32.4* 30.8* 34.0* 30.8* 34.1* 33.3*  MCV 90.8 93.3 93.9 92.2 92.2 88.6  PLT 468* 464* 492* 483* 533* 470*   Basic Metabolic Panel: Recent Labs  Lab 02/28/21 0445 03/01/21 0537 03/02/21 0501 03/03/21 0448 03/04/21 0517 03/05/21 0508  NA 133* 132* 134* 135 135 134*  K 4.8 5.2* 5.1 4.5 4.3 4.0  CL 101 98 101 97* 91* 89*  CO2 25 24 25 30  34* 35*  GLUCOSE 200* 178* 164* 140* 148* 145*  BUN 15 28* 32* 29* 24* 24*  CREATININE 0.64 1.23* 0.89 0.79 0.72 0.73  CALCIUM 8.4* 8.5* 8.7* 8.6* 8.8* 8.6*  MG 1.9  --   --   --  2.1 2.0   GFR: Estimated Creatinine Clearance: 76.1 mL/min (by C-G formula based on SCr of 0.73 mg/dL). Liver Function Tests: Recent Labs  Lab 02/27/21 2009 02/28/21 0445  AST 14* 16  ALT 16 16  ALKPHOS 120 108  BILITOT 1.0 0.9  PROT 6.8 6.6  ALBUMIN 3.3* 3.2*   No results for input(s): LIPASE, AMYLASE in the last 168 hours. No results for input(s): AMMONIA in the last 168 hours. Coagulation Profile: No results for input(s): INR, PROTIME in the last 168 hours. Cardiac Enzymes: No results for input(s): CKTOTAL, CKMB, CKMBINDEX, TROPONINI in the last 168 hours. BNP (last 3 results) No results for input(s): PROBNP in the last 8760 hours. HbA1C: No results for input(s): HGBA1C in the last 72 hours. CBG: Recent Labs  Lab 02/28/21 0328 02/28/21 1627  GLUCAP 201* 173*   Lipid Profile: No results for input(s): CHOL, HDL, LDLCALC, TRIG, CHOLHDL, LDLDIRECT in the last 72 hours. Thyroid Function Tests: No results for input(s): TSH, T4TOTAL, FREET4, T3FREE, THYROIDAB in the  last 72 hours. Anemia Panel: No results for input(s): VITAMINB12, FOLATE, FERRITIN, TIBC, IRON, RETICCTPCT in the last 72 hours. Sepsis Labs: No results for input(s): PROCALCITON, LATICACIDVEN in the last 168 hours.  Recent Results (from the past 240 hour(s))  Resp Panel by RT-PCR (Flu A&B, Covid) Nasopharyngeal Swab     Status: None   Collection Time: 02/27/21  8:28 PM   Specimen: Nasopharyngeal Swab; Nasopharyngeal(NP) swabs in vial transport medium  Result Value Ref Range Status   SARS Coronavirus 2 by RT PCR NEGATIVE NEGATIVE Final    Comment: (NOTE) SARS-CoV-2 target nucleic acids are NOT DETECTED.  The SARS-CoV-2 RNA is generally detectable in upper respiratory specimens during the acute phase of infection. The lowest concentration of SARS-CoV-2 viral copies this assay can detect is 138 copies/mL. A negative result does not preclude SARS-Cov-2 infection and should not be used as the sole basis for treatment or other patient management decisions. A negative result may occur with  improper specimen collection/handling, submission of specimen other than nasopharyngeal swab, presence of viral mutation(s) within the areas targeted by this assay, and inadequate number of viral copies(<138 copies/mL). A negative result must be combined with clinical observations, patient history, and epidemiological information. The expected result is Negative.  Fact Sheet for Patients:  EntrepreneurPulse.com.au  Fact Sheet for Healthcare Providers:  IncredibleEmployment.be  This test is no t yet approved or cleared by the [place] FDA and  has been authorized for detection and/or diagnosis of SARS-CoV-2 by FDA under an Emergency Use Authorization (EUA). This EUA will remain  in effect (meaning this test can be used) for the duration of the COVID-19 declaration under Section 564(b)(1) of the Act, 21 U.S.C.section 360bbb-3(b)(1), unless the authorization is  terminated  or revoked sooner.       Influenza A by PCR NEGATIVE NEGATIVE Final   Influenza B by PCR NEGATIVE NEGATIVE Final    Comment: (NOTE) The Xpert Xpress SARS-CoV-2/FLU/RSV plus assay is intended as an aid in the diagnosis of influenza from Nasopharyngeal swab specimens and should not be used as a sole basis for treatment. Nasal washings and aspirates are unacceptable for Xpert Xpress SARS-CoV-2/FLU/RSV testing.  Fact Sheet for Patients: EntrepreneurPulse.com.au  Fact Sheet for Healthcare Providers: IncredibleEmployment.be  This test is not yet approved or cleared by the [place] FDA and has been authorized for detection and/or diagnosis of SARS-CoV-2 by FDA under an Emergency Use Authorization (EUA). This EUA will remain in effect (meaning  this test can be used) for the duration of the COVID-19 declaration under Section 564(b)(1) of the Act, 21 U.S.C. section 360bbb-3(b)(1), unless the authorization is terminated or revoked.  Performed at [facility] [address], [place], [place] [address]   Blood culture (routine x 2)     Status: None   Collection Time: 02/28/21  1:25 AM   Specimen: Right Antecubital; Blood  Result Value Ref Range Status   Specimen Description RIGHT ANTECUBITAL  Final   Special Requests   Final    BOTTLES DRAWN AEROBIC AND ANAEROBIC Blood Culture adequate volume   Culture   Final    NO GROWTH 5 DAYS Performed at [facility], [address], [place], [place] [address]    Report Status 03/05/2021 FINAL  Final  Blood culture (routine x 2)     Status: None   Collection Time: 02/28/21  4:22 AM   Specimen: BLOOD LEFT HAND  Result Value Ref Range Status   Specimen Description   Final    BLOOD LEFT HAND BOTTLES DRAWN AEROBIC AND ANAEROBIC   Special Requests Blood Culture adequate volume  Final   Culture   Final    NO GROWTH 5 DAYS Performed at [facility], [address] [place], [place] [address]     Report Status 03/05/2021 FINAL  Final  Culture, Respiratory w Gram Stain     Status: None   Collection Time: 02/28/21  2:34 PM   Specimen: SPU  Result Value Ref Range Status   Specimen Description   Final    SPUTUM Performed at [facility], [address], [place], [place] [address]    Special Requests   Final    NONE Performed at [facility], [address], [place], [place] [address]    Gram Stain RARE WBC PRESENT, PREDOMINANTLY PMN FEW YEAST   Final   Culture   Final    ABUNDANT Normal respiratory flora-no Staph aureus or Pseudomonas seen Performed at [place] [address] [place], [place] [address]    Report Status 03/03/2021 FINAL  Final         Radiology Studies: No results found.      Scheduled Meds:  arformoterol  15 mcg Nebulization BID   aspirin EC  81 mg Oral Daily   budesonide (PULMICORT) nebulizer solution  0.5 mg Nebulization BID   dabigatran  150 mg Oral BID   dextromethorphan-guaiFENesin  1 tablet Oral BID   diltiazem  180 mg Oral Daily   furosemide  40 mg Intravenous Q12H   influenza vac split quadrivalent PF  0.5 mL Intramuscular Tomorrow-1000   ipratropium-albuterol  3 mL Nebulization BID   methylPREDNISolone (SOLU-MEDROL) injection  40 mg Intravenous Q12H   pantoprazole  40 mg Oral Daily   pneumococcal 23 valent vaccine  0.5 mL Intramuscular Tomorrow-1000   revefenacin  175 mcg Nebulization Daily   senna  1 tablet Oral Daily   Continuous Infusions:  sodium chloride 250 mL (03/03/21 2056)   cefTRIAXone (ROCEPHIN)  IV 1 g (03/05/21 0002)     LOS: 5 days    Time spent: 35 minutes    Primitivo Merkey Darleen Crocker, DO [facility]  If 7PM-7AM, please contact night-coverage [email or web address] 03/05/2021, 10:43 AM

## 2021-03-06 LAB — BASIC METABOLIC PANEL
Anion gap: 10 (ref 5–15)
BUN: 24 mg/dL — ABNORMAL HIGH (ref 6–20)
CO2: 38 mmol/L — ABNORMAL HIGH (ref 22–32)
Calcium: 8.9 mg/dL (ref 8.9–10.3)
Chloride: 89 mmol/L — ABNORMAL LOW (ref 98–111)
Creatinine, Ser: 0.72 mg/dL (ref 0.44–1.00)
GFR, Estimated: >60 mL/min (ref >60)
Glucose, Bld: 142 mg/dL — ABNORMAL HIGH (ref 70–99)
Potassium: 3.9 mmol/L (ref 3.5–5.1)
Sodium: 137 mmol/L (ref 135–145)

## 2021-03-06 LAB — CBC
HCT: 36.7 % (ref 36.0–46.0)
Hemoglobin: 11.4 g/dL — ABNORMAL LOW (ref 12.0–15.0)
MCH: 27.9 pg (ref 26.0–34.0)
MCHC: 31.1 g/dL (ref 30.0–36.0)
MCV: 90 fL (ref 80.0–100.0)
Platelets: 455 10*3/uL — ABNORMAL HIGH (ref 150–400)
RBC: 4.08 MIL/uL (ref 3.87–5.11)
RDW: 17.3 % — ABNORMAL HIGH (ref 11.5–15.5)
WBC: 13.1 10*3/uL — ABNORMAL HIGH (ref 4.0–10.5)
nRBC: 0 % (ref 0.0–0.2)

## 2021-03-06 LAB — MAGNESIUM: Magnesium: 2.1 mg/dL (ref 1.7–2.4)

## 2021-03-06 MED ORDER — DILTIAZEM HCL 25 MG/5ML IV SOLN
5.0000 mg | Freq: Once | INTRAVENOUS | Status: AC
  Administered 2021-03-06: 5 mg via INTRAVENOUS
  Filled 2021-03-06: qty 5

## 2021-03-06 MED ORDER — INFLUENZA VAC SPLIT QUAD 0.5 ML IM SUSY
0.5000 mL | PREFILLED_SYRINGE | INTRAMUSCULAR | Status: AC
  Administered 2021-03-07: 0.5 mL via INTRAMUSCULAR
  Filled 2021-03-06: qty 0.5

## 2021-03-06 MED ORDER — PNEUMOCOCCAL VAC POLYVALENT 25 MCG/0.5ML IJ INJ
0.5000 mL | INJECTION | INTRAMUSCULAR | Status: AC
  Administered 2021-03-07: 0.5 mL via INTRAMUSCULAR
  Filled 2021-03-06: qty 0.5

## 2021-03-06 NOTE — Progress Notes (Signed)
   03/06/21 1934  Vitals  Temp 99 F (37.2 C)  Temp Source Oral  BP 138/90  BP Location Right Arm  Patient Position (if appropriate) Sitting  Pulse Rate (!) 140  Pulse Rate Source Monitor  ECG Heart Rate (!) 140  Resp 18  Level of Consciousness  Level of Consciousness Alert  MEWS COLOR  MEWS Score Color Yellow  Oxygen Therapy  SpO2 91 %  O2 Device Nasal Cannula  O2 Flow Rate (L/min) 2 L/min  MEWS Score  MEWS Temp 0  MEWS Systolic 0  MEWS Pulse 3  MEWS RR 0  MEWS LOC 0  MEWS Score 3  Provider Notification  Provider Name/Title Dr. Josephine Cables  Date Provider Notified 03/06/21  Time Provider Notified 1944  Notification Type Page  Notification Reason Other (Comment) (elevated HR, given PRN Cardizem)  Provider response No new orders

## 2021-03-06 NOTE — Progress Notes (Signed)
   03/06/21 2251  Vitals  BP 114/67  MAP (mmHg) 79  BP Location Right Arm  BP Method Automatic  Patient Position (if appropriate) Lying  Pulse Rate (!) 130  MEWS COLOR  MEWS Score Color Yellow  Oxygen Therapy  SpO2 94 %  MEWS Score  MEWS Temp 0  MEWS Systolic 0  MEWS Pulse 3  MEWS RR 0  MEWS LOC 0  MEWS Score 3   Cardizem 5mg  IV once given

## 2021-03-06 NOTE — Progress Notes (Signed)
   03/06/21 2038  Vitals  Temp 99 F (37.2 C)  Temp Source Oral  BP 118/65  MAP (mmHg) 80  BP Location Right Arm  BP Method Automatic  Patient Position (if appropriate) Lying  Pulse Rate (!) 142  Resp 20  MEWS COLOR  MEWS Score Color Yellow  Oxygen Therapy  SpO2 91 %  O2 Device Nasal Cannula  O2 Flow Rate (L/min) 2 L/min  Pain Assessment  Pain Scale 0-10  Pain Score 0  MEWS Score  MEWS Temp 0  MEWS Systolic 0  MEWS Pulse 3  MEWS RR 0  MEWS LOC 0  MEWS Score 3  Provider Notification  Provider Name/Title Dr. Honor Loh  Date Provider Notified 03/06/21  Time Provider Notified 2040  Notification Type Page  Notification Reason Other (Comment) (HR remains elevated in 140's)

## 2021-03-06 NOTE — Progress Notes (Signed)
Patient weaned down to 1.5 liters via nasal cannula, patient reported having shortness of breath, oxygen sat at 92% at 1.5 liters. Placed patient back on 2 liters via nasal cannula, patient reported improvement in breathing. MD Manuella Ghazi made aware.

## 2021-03-06 NOTE — Progress Notes (Signed)
PROGRESS NOTE    Kathleen Wright  WER:[id number] DOB: [birth year] DOA: 02/27/2021 PCP: Cory Munch, PA-C   Brief Narrative:   Kathleen Wright  is a 55 y.o. female, with history of asthma, COPD, hypertrophic cardiomyopathy, paroxysmal atrial fibrillation, and more presents the ED with a chief complaint of dyspnea.  Is been ongoing for 1 week.  Its been getting worse over the week.  She has been admitted for acute hypoxemic respiratory failure that is multifactorial and secondary to acute on chronic diastolic congestive heart failure as well as acute COPD exacerbation related to bronchiectasis/pneumonia.  Assessment & Plan:   Active Problems:   History of atrial fibrillation   Elevated troponin   COPD with acute exacerbation (HCC)   Protein-calorie malnutrition, mild (HCC)   Respiratory failure with hypoxia (HCC)   congestive heart failure (diastolic and right heart sided failure), bronchiectasis/pneumonia and COPD exacerbation. -Patient complaining of lower extremity edema, orthopnea, dyspnea on exertion and increased wheezing along with shortness of breath and the need of oxygen supplementation due to new development of hypoxia. -CT angiogram of the chest demonstrated no pulmonary embolism but had bronchial wall thickening and peribronchial nodular infiltrates representing multifocal pneumonia. -2D echo with positive grade 3 diastolic dysfunction, ejection fraction 65 to 70%, no wall motion abnormalities and the presence of low normal right ventricular systolic function. -Will continue treatment with IV diuresis (but dose will be adjusted, as patient currently demonstrating no crackles), continue low-sodium diet, daily weights and strict I's and O's.  Increased to IV 40 mg twice daily on 9/23 with ongoing edema noted, but good diuresis. -Continue also nebulizer management, IV steroids, mucolytic/antitussive regimen; and IV antibiotics (Rocephin and Zithromax).  Decreased steroids to 40 mg  IV twice daily to assist with delirium on 9/21 -Continue to wean off oxygen supplementation as tolerated; goal is for O2 sats above 90%.  -Continue the use of flutter valve and follow clinical response. -Given increased somnolence ABG has been checked; CO2 mildly elevated for someone with underlying history of COPD.  Oxygen saturation was 81 on patient's ABG; instructions given to nursing staff to decrease oxygen saturation to avoid CO2 retention. -Minimize the use of narcotics and avoid the use of benzodiazepines.  If patient's mentation failed to completely improve my injury requiring intermittent use of CPAP/BiPAP. -PT evaluation ordered with HHPT recommendation   2-History of atrial fibrillation -Rate controlled -Continue the use of Cardizem -Continue the use of Pradaxa for secondary prevention. -Metoprolol has been discontinued in the setting of bradycardia.   3-Elevated troponin -In the setting of demand ischemia secondary to ongoing congestive heart failure exacerbation and pulmonary process. -Patient denies chest pain -No ischemic changes on EKG at time of admission or telemetry evaluation.   4-class II obesity -Body mass index is 37.53 kg/m. -Low calorie diet, portion control and increase physical activity discussed with patient. -Will benefit of outpatient sleep study.   5-gastroesophageal reflux disease -Continue PPI.   6-hospital-acquired delirium-improving -Plan to decrease steroids as this may be contributing a component of psychosis -Hold off on Seroquel for now given some increased somnolence  7-Oral thrush -Secondary to steroid use -Started on nystatin swish and swallow 9/23     DVT prophylaxis: Pradaxa Code Status: Full Family Communication: Daughter at bedside 9/24 Disposition Plan:  Status is: Inpatient   Remains inpatient appropriate because:Altered mental status, IV treatments appropriate due to intensity of illness or inability to take PO, and  Inpatient level of care appropriate due to severity of illness  Dispo: The patient is from: Home              Anticipated d/c is to: Home              Patient currently is not medically stable to d/c.              Difficult to place patient No     Consultants:  None   Procedures:  See below  Antimicrobials:  Anti-infectives (From admission, onward)    Start     Dose/Rate Route Frequency Ordered Stop   03/01/21 0000  cefTRIAXone (ROCEPHIN) 1 g in sodium chloride 0.9 % 100 mL IVPB        1 g 200 mL/hr over 30 Minutes Intravenous Every 24 hours 02/28/21 0259     02/28/21 0000  cefTRIAXone (ROCEPHIN) 1 g in sodium chloride 0.9 % 100 mL IVPB        1 g 200 mL/hr over 30 Minutes Intravenous  Once 02/27/21 2358 02/28/21 0107   02/28/21 0000  azithromycin (ZITHROMAX) 500 mg in sodium chloride 0.9 % 250 mL IVPB  Status:  Discontinued        500 mg 250 mL/hr over 60 Minutes Intravenous Every 24 hours 02/27/21 2358 03/04/21 1251       Subjective: Patient seen and evaluated today with no new acute complaints or concerns. No acute concerns or events noted overnight.  She appears to be diuresing well with slow improvement in symptoms.  Objective: Vitals:   03/06/21 0732 03/06/21 0737 03/06/21 0743 03/06/21 0935  BP:    135/67  Pulse:      Resp:      Temp:      TempSrc:      SpO2: 93% 93% 93%   Weight:      Height:        Intake/Output Summary (Last 24 hours) at 03/06/2021 1019 Last data filed at 03/06/2021 0500 Gross per 24 hour  Intake 840 ml  Output 5450 ml  Net -4610 ml   Filed Weights   03/04/21 0624 03/05/21 0529 03/06/21 0509  Weight: 83.1 kg 81.7 kg 79.6 kg    Examination:  General exam: Appears calm and comfortable  Respiratory system: Clear to auscultation. Respiratory effort normal. Currently on [place] Cardiovascular system: S1 & S2 heard, RRR.  Gastrointestinal system: Abdomen is soft Central nervous system: Alert and awake Extremities: Scant edema Skin: No  significant lesions noted Psychiatry: Flat affect.    Data Reviewed: I have personally reviewed following labs and imaging studies  CBC: Recent Labs  Lab 02/27/21 2009 02/28/21 0445 03/01/21 0537 03/03/21 0448 03/04/21 0517 03/05/21 0508 03/06/21 0518  WBC 17.3*   < > 19.1* 11.2* 13.6* 12.4* 13.1*  NEUTROABS 13.7*  --   --   --   --   --   --   HGB 9.8*   < > 10.2* 9.2* 10.4* 10.2* 11.4*  HCT 32.4*   < > 34.0* 30.8* 34.1* 33.3* 36.7  MCV 90.8   < > 93.9 92.2 92.2 88.6 90.0  PLT 468*   < > 492* 483* 533* 494* 455*   < > = values in this interval not displayed.   Basic Metabolic Panel: Recent Labs  Lab 02/28/21 0445 03/01/21 0537 03/02/21 0501 03/03/21 0448 03/04/21 0517 03/05/21 0508 03/06/21 0518  NA 133*   < > 134* 135 135 134* 137  K 4.8   < > 5.1 4.5 4.3 4.0 3.9  CL 101   < >  101 97* 91* 89* 89*  CO2 25   < > 25 30 34* 35* 38*  GLUCOSE 200*   < > 164* 140* 148* 145* 142*  BUN 15   < > 32* 29* 24* 24* 24*  CREATININE 0.64   < > 0.89 0.79 0.72 0.73 0.72  CALCIUM 8.4*   < > 8.7* 8.6* 8.8* 8.6* 8.9  MG 1.9  --   --   --  2.1 2.0 2.1   < > = values in this interval not displayed.   GFR: Estimated Creatinine Clearance: 75 mL/min (by C-G formula based on SCr of 0.72 mg/dL). Liver Function Tests: Recent Labs  Lab 02/27/21 2009 02/28/21 0445  AST 14* 16  ALT 16 16  ALKPHOS 120 108  BILITOT 1.0 0.9  PROT 6.8 6.6  ALBUMIN 3.3* 3.2*   No results for input(s): LIPASE, AMYLASE in the last 168 hours. No results for input(s): AMMONIA in the last 168 hours. Coagulation Profile: No results for input(s): INR, PROTIME in the last 168 hours. Cardiac Enzymes: No results for input(s): CKTOTAL, CKMB, CKMBINDEX, TROPONINI in the last 168 hours. BNP (last 3 results) No results for input(s): PROBNP in the last 8760 hours. HbA1C: No results for input(s): HGBA1C in the last 72 hours. CBG: Recent Labs  Lab 02/28/21 0328 02/28/21 1627  GLUCAP 201* 173*   Lipid  Profile: No results for input(s): CHOL, HDL, LDLCALC, TRIG, CHOLHDL, LDLDIRECT in the last 72 hours. Thyroid Function Tests: No results for input(s): TSH, T4TOTAL, FREET4, T3FREE, THYROIDAB in the last 72 hours. Anemia Panel: No results for input(s): VITAMINB12, FOLATE, FERRITIN, TIBC, IRON, RETICCTPCT in the last 72 hours. Sepsis Labs: No results for input(s): PROCALCITON, LATICACIDVEN in the last 168 hours.  Recent Results (from the past 240 hour(s))  Resp Panel by RT-PCR (Flu A&B, Covid) Nasopharyngeal Swab     Status: None   Collection Time: 02/27/21  8:28 PM   Specimen: Nasopharyngeal Swab; Nasopharyngeal(NP) swabs in vial transport medium  Result Value Ref Range Status   SARS Coronavirus 2 by RT PCR NEGATIVE NEGATIVE Final    Comment: (NOTE) SARS-CoV-2 target nucleic acids are NOT DETECTED.  The SARS-CoV-2 RNA is generally detectable in upper respiratory specimens during the acute phase of infection. The lowest concentration of SARS-CoV-2 viral copies this assay can detect is 138 copies/mL. A negative result does not preclude SARS-Cov-2 infection and should not be used as the sole basis for treatment or other patient management decisions. A negative result may occur with  improper specimen collection/handling, submission of specimen other than nasopharyngeal swab, presence of viral mutation(s) within the areas targeted by this assay, and inadequate number of viral copies(<138 copies/mL). A negative result must be combined with clinical observations, patient history, and epidemiological information. The expected result is Negative.  Fact Sheet for Patients:  EntrepreneurPulse.com.au  Fact Sheet for Healthcare Providers:  IncredibleEmployment.be  This test is no t yet approved or cleared by the [place] FDA and  has been authorized for detection and/or diagnosis of SARS-CoV-2 by FDA under an Emergency Use Authorization (EUA). This EUA  will remain  in effect (meaning this test can be used) for the duration of the COVID-19 declaration under Section 564(b)(1) of the Act, 21 U.S.C.section 360bbb-3(b)(1), unless the authorization is terminated  or revoked sooner.       Influenza A by PCR NEGATIVE NEGATIVE Final   Influenza B by PCR NEGATIVE NEGATIVE Final    Comment: (NOTE) The Xpert Xpress SARS-CoV-2/FLU/RSV plus  assay is intended as an aid in the diagnosis of influenza from Nasopharyngeal swab specimens and should not be used as a sole basis for treatment. Nasal washings and aspirates are unacceptable for Xpert Xpress SARS-CoV-2/FLU/RSV testing.  Fact Sheet for Patients: EntrepreneurPulse.com.au  Fact Sheet for Healthcare Providers: IncredibleEmployment.be  This test is not yet approved or cleared by the [place] FDA and has been authorized for detection and/or diagnosis of SARS-CoV-2 by FDA under an Emergency Use Authorization (EUA). This EUA will remain in effect (meaning this test can be used) for the duration of the COVID-19 declaration under Section 564(b)(1) of the Act, 21 U.S.C. section 360bbb-3(b)(1), unless the authorization is terminated or revoked.  Performed at [facility], [address] [place], [place] [address]   Blood culture (routine x 2)     Status: None   Collection Time: 02/28/21  1:25 AM   Specimen: Right Antecubital; Blood  Result Value Ref Range Status   Specimen Description RIGHT ANTECUBITAL  Final   Special Requests   Final    BOTTLES DRAWN AEROBIC AND ANAEROBIC Blood Culture adequate volume   Culture   Final    NO GROWTH 5 DAYS Performed at [facility], [address] [place], [place] [address]    Report Status 03/05/2021 FINAL  Final  Blood culture (routine x 2)     Status: None   Collection Time: 02/28/21  4:22 AM   Specimen: BLOOD LEFT HAND  Result Value Ref Range Status   Specimen Description   Final    BLOOD LEFT HAND BOTTLES  DRAWN AEROBIC AND ANAEROBIC   Special Requests Blood Culture adequate volume  Final   Culture   Final    NO GROWTH 5 DAYS Performed at [facility], [address] [place], [place] [address]    Report Status 03/05/2021 FINAL  Final  Culture, Respiratory w Gram Stain     Status: None   Collection Time: 02/28/21  2:34 PM   Specimen: SPU  Result Value Ref Range Status   Specimen Description   Final    SPUTUM Performed at [facility], [address] [place], [place] [address]    Special Requests   Final    NONE Performed at [facility], [address], [place], [place] [address]    Gram Stain RARE WBC PRESENT, PREDOMINANTLY PMN FEW YEAST   Final   Culture   Final    ABUNDANT Normal respiratory flora-no Staph aureus or Pseudomonas seen Performed at [facility] [address] [place], [place] [address]    Report Status 03/03/2021 FINAL  Final         Radiology Studies: No results found.      Scheduled Meds:  arformoterol  15 mcg Nebulization BID   aspirin EC  81 mg Oral Daily   budesonide (PULMICORT) nebulizer solution  0.5 mg Nebulization BID   dabigatran  150 mg Oral BID   dextromethorphan-guaiFENesin  1 tablet Oral BID   diltiazem  180 mg Oral Daily   furosemide  40 mg Intravenous Q12H   influenza vac split quadrivalent PF  0.5 mL Intramuscular Tomorrow-1000   ipratropium-albuterol  3 mL Nebulization BID   methylPREDNISolone (SOLU-MEDROL) injection  40 mg Intravenous Q12H   nystatin  5 mL Oral QID   pantoprazole  40 mg Oral Daily   pneumococcal 23 valent vaccine  0.5 mL Intramuscular Tomorrow-1000   revefenacin  175 mcg Nebulization Daily   senna  1 tablet Oral Daily  Continuous Infusions:  sodium chloride 250 mL (03/03/21 2056)   cefTRIAXone (ROCEPHIN)  IV 1 g (03/06/21 0010)     LOS: 6 days    Time spent: 35 minutes    Kamarah Bilotta D Manuella Ghazi, DO [facility]  If 7PM-7AM, please contact night-coverage [email or web address] 03/06/2021, 10:19 AM

## 2021-03-06 NOTE — Progress Notes (Deleted)
   03/06/21 1934  Vitals  Temp 99 F (37.2 C)  Temp Source Oral  BP 138/90  BP Location Right Arm  Patient Position (if appropriate) Sitting  Pulse Rate (!) 140  Pulse Rate Source Monitor  ECG Heart Rate (!) 140  Resp 18  Level of Consciousness  Level of Consciousness Alert  MEWS COLOR  MEWS Score Color Yellow  Oxygen Therapy  SpO2 91 %  O2 Device Nasal Cannula  O2 Flow Rate (L/min) 2 L/min  MEWS Score  MEWS Temp 0  MEWS Systolic 0  MEWS Pulse 3  MEWS RR 0  MEWS LOC 0  MEWS Score 3  Provider Notification  Provider Name/Title Dr. Josephine Cables  Date Provider Notified 03/03/21  Time Provider Notified 1944  Notification Type Page  Notification Reason Other (Comment) (elevated HR, given PRN Cardizem)

## 2021-03-06 NOTE — Progress Notes (Signed)
   03/06/21 2212  Vitals  Temp 98.8 F (37.1 C)  Temp Source Oral  BP 111/73  MAP (mmHg) 86  BP Location Right Arm  BP Method Automatic  Patient Position (if appropriate) Lying  Pulse Rate (!) 139  Resp 18  MEWS COLOR  MEWS Score Color Yellow  Oxygen Therapy  SpO2 92 %  O2 Device Nasal Cannula  O2 Flow Rate (L/min) 2 L/min  Pain Assessment  Pain Scale 0-10  Pain Score 0  MEWS Score  MEWS Temp 0  MEWS Systolic 0  MEWS Pulse 3  MEWS RR 0  MEWS LOC 0  MEWS Score 3  Provider Notification  Provider Name/Title Dr. Josephine Cables  Date Provider Notified 03/06/21  Time Provider Notified 2214  Notification Type Page  Notification Reason Other (Comment) (EKG obtained, HR remains elevated)

## 2021-03-06 NOTE — Progress Notes (Signed)
Patient running 140's afib on the monitor. Given Cardizem 30mg  PO for heart rate greater than 100. Dr.Adefeso notified.

## 2021-03-07 LAB — BASIC METABOLIC PANEL
Anion gap: 11 (ref 5–15)
BUN: 27 mg/dL — ABNORMAL HIGH (ref 6–20)
CO2: 37 mmol/L — ABNORMAL HIGH (ref 22–32)
Calcium: 8.6 mg/dL — ABNORMAL LOW (ref 8.9–10.3)
Chloride: 87 mmol/L — ABNORMAL LOW (ref 98–111)
Creatinine, Ser: 0.69 mg/dL (ref 0.44–1.00)
GFR, Estimated: >60 mL/min (ref >60)
Glucose, Bld: 147 mg/dL — ABNORMAL HIGH (ref 70–99)
Potassium: 3.4 mmol/L — ABNORMAL LOW (ref 3.5–5.1)
Sodium: 135 mmol/L (ref 135–145)

## 2021-03-07 LAB — MAGNESIUM: Magnesium: 2.1 mg/dL (ref 1.7–2.4)

## 2021-03-07 LAB — MRSA NEXT GEN BY PCR, NASAL: MRSA by PCR Next Gen: NOT DETECTED

## 2021-03-07 MED ORDER — POTASSIUM CHLORIDE CRYS ER 20 MEQ PO TBCR
40.0000 meq | EXTENDED_RELEASE_TABLET | Freq: Two times a day (BID) | ORAL | Status: DC
  Administered 2021-03-07 – 2021-03-08 (×4): 40 meq via ORAL
  Filled 2021-03-07 (×4): qty 2

## 2021-03-07 MED ORDER — DILTIAZEM HCL-DEXTROSE 125-5 MG/125ML-% IV SOLN (PREMIX)
5.0000 mg/h | INTRAVENOUS | Status: DC
  Administered 2021-03-07: 5 mg/h via INTRAVENOUS
  Filled 2021-03-07 (×2): qty 125

## 2021-03-07 MED ORDER — METOPROLOL TARTRATE 25 MG PO TABS
25.0000 mg | ORAL_TABLET | Freq: Two times a day (BID) | ORAL | Status: DC
  Administered 2021-03-07 – 2021-03-09 (×4): 25 mg via ORAL
  Filled 2021-03-07 (×5): qty 1

## 2021-03-07 MED ORDER — LEVALBUTEROL HCL 0.63 MG/3ML IN NEBU: 0.6300 mg | INHALATION_SOLUTION | Freq: Four times a day (QID) | RESPIRATORY_TRACT | Status: DC | PRN

## 2021-03-07 MED ORDER — PREDNISONE 20 MG PO TABS
40.0000 mg | ORAL_TABLET | Freq: Every day | ORAL | Status: DC
  Administered 2021-03-08 – 2021-03-09 (×2): 40 mg via ORAL
  Filled 2021-03-07 (×3): qty 2

## 2021-03-07 MED ORDER — CHLORHEXIDINE GLUCONATE CLOTH 2 % EX PADS
6.0000 | MEDICATED_PAD | Freq: Every day | CUTANEOUS | Status: DC
  Administered 2021-03-08 – 2021-03-09 (×2): 6 via TOPICAL

## 2021-03-07 MED ORDER — DILTIAZEM HCL 25 MG/5ML IV SOLN
10.0000 mg | Freq: Once | INTRAVENOUS | Status: AC
  Administered 2021-03-07: 10 mg via INTRAVENOUS
  Filled 2021-03-07: qty 5

## 2021-03-07 NOTE — Progress Notes (Signed)
Report given and patient transferred to ICU.

## 2021-03-07 NOTE — Progress Notes (Signed)
PROGRESS NOTE    Kathleen Wright  ZSW:[id number] DOB: Mar 10, 1966 DOA: 02/27/2021 PCP: Cory Munch, PA-C   Brief Narrative:   Kathleen Wright  is a 55 y.o. female, with history of asthma, COPD, hypertrophic cardiomyopathy, paroxysmal atrial fibrillation, and more presents the ED with a chief complaint of dyspnea.  Is been ongoing for 1 week.  Its been getting worse over the week.  She has been admitted for acute hypoxemic respiratory failure that is multifactorial and secondary to acute on chronic diastolic congestive heart failure as well as acute COPD exacerbation related to bronchiectasis/pneumonia.  She has now developed some atrial fibrillation with RVR with minimal symptoms.  Assessment & Plan:   Active Problems:   History of atrial fibrillation   Elevated troponin   COPD with acute exacerbation (HCC)   Protein-calorie malnutrition, mild (HCC)   Respiratory failure with hypoxia (HCC)   1-acute hypoxemic respiratory failure secondary to acute on chronic congestive heart failure (diastolic and right heart sided failure), bronchiectasis/pneumonia and COPD exacerbation. -Patient complaining of lower extremity edema, orthopnea, dyspnea on exertion and increased wheezing along with shortness of breath and the need of oxygen supplementation due to new development of hypoxia. -CT angiogram of the chest demonstrated no pulmonary embolism but had bronchial wall thickening and peribronchial nodular infiltrates representing multifocal pneumonia. -2D echo with positive grade 3 diastolic dysfunction, ejection fraction 65 to 70%, no wall motion abnormalities and the presence of low normal right ventricular systolic function. -Will continue treatment with IV diuresis (but dose will be adjusted, as patient currently demonstrating no crackles), continue low-sodium diet, daily weights and strict I's and O's.  Discontinued further Lasix dosing given atrial fibrillation with RVR and noted  euvolemia. -Continue also nebulizer management, IV steroids, mucolytic/antitussive regimen; and IV antibiotics (Rocephin and Zithromax).  Decreased steroids to 40 mg prednisone daily on 9/25 -Continue to wean off oxygen supplementation as tolerated; goal is for O2 sats above 90%.  -Continue the use of flutter valve and follow clinical response. -Given increased somnolence ABG has been checked; CO2 mildly elevated for someone with underlying history of COPD.  Oxygen saturation was 81 on patient's ABG; instructions given to nursing staff to decrease oxygen saturation to avoid CO2 retention. -Minimize the use of narcotics and avoid the use of benzodiazepines.  If patient's mentation failed to completely improve my injury requiring intermittent use of CPAP/BiPAP. -PT evaluation ordered with HHPT recommendation, plan to repeat evaluation once medically stable   2-History of atrial fibrillation, now with RVR -IV Cardizem infusion and was transferred to stepdown unit, discontinue Lasix -Continue the use of Pradaxa for secondary prevention. -Resume home metoprolol, but at lower dose of 25 mg twice daily   3-Elevated troponin -In the setting of demand ischemia secondary to ongoing congestive heart failure exacerbation and pulmonary process. -Patient denies chest pain -No ischemic changes on EKG at time of admission or telemetry evaluation.   4-class II obesity -Body mass index is 37.53 kg/m. -Low calorie diet, portion control and increase physical activity discussed with patient. -Will benefit of outpatient sleep study.   5-gastroesophageal reflux disease -Continue PPI.   6-hospital-acquired delirium-improving -Plan to decrease steroids as this may be contributing a component of psychosis -Hold off on Seroquel for now given some increased somnolence   7-Oral thrush -Secondary to steroid use -Started on nystatin swish and swallow 9/23     DVT prophylaxis: Pradaxa Code Status: Full Family  Communication: Granddaughter at bedside 9/25 Disposition Plan:  Status is: Inpatient  Remains inpatient appropriate because:Altered mental status, IV treatments appropriate due to intensity of illness or inability to take PO, and Inpatient level of care appropriate due to severity of illness   Dispo: The patient is from: Home              Anticipated d/c is to: Home              Patient currently is not medically stable to d/c.              Difficult to place patient No     Consultants:  None   Procedures:  See below  Antimicrobials:  Anti-infectives (From admission, onward)    Start     Dose/Rate Route Frequency Ordered Stop   03/01/21 0000  cefTRIAXone (ROCEPHIN) 1 g in sodium chloride 0.9 % 100 mL IVPB        1 g 200 mL/hr over 30 Minutes Intravenous Every 24 hours 02/28/21 0259     02/28/21 0000  cefTRIAXone (ROCEPHIN) 1 g in sodium chloride 0.9 % 100 mL IVPB        1 g 200 mL/hr over 30 Minutes Intravenous  Once 02/27/21 2358 02/28/21 0107   02/28/21 0000  azithromycin (ZITHROMAX) 500 mg in sodium chloride 0.9 % 250 mL IVPB  Status:  Discontinued        500 mg 250 mL/hr over 60 Minutes Intravenous Every 24 hours 02/27/21 2358 03/04/21 1251      Subjective: Patient seen and evaluated today with minimal palpitations noted.  She has developed atrial fibrillation with RVR overnight.  She has not been on her usual home metoprolol during this hospitalization given some issues with prior bradycardia.  Objective: Vitals:   03/07/21 0920 03/07/21 0929 03/07/21 1003 03/07/21 1041  BP: 92/63 115/89  101/66  Pulse: 78 66  (!) 118  Resp:   18 20  Temp:      TempSrc:      SpO2: 96% 97% 94% 95%  Weight:      Height:        Intake/Output Summary (Last 24 hours) at 03/07/2021 1116 Last data filed at 03/07/2021 0700 Gross per 24 hour  Intake 1700 ml  Output 1600 ml  Net 100 ml   Filed Weights   03/05/21 0529 03/06/21 0509 03/07/21 0504  Weight: 81.7 kg 79.6 kg 76.8 kg     Examination:  General exam: Appears calm and comfortable  Respiratory system: Clear to auscultation. Respiratory effort normal.  Currently on nasal cannula Cardiovascular system: S1 & S2 heard, irregular and tachycardic. Gastrointestinal system: Abdomen is soft Central nervous system: Alert and awake Extremities: Scant bilateral edema Skin: No significant lesions noted Psychiatry: Flat affect.    Data Reviewed: I have personally reviewed following labs and imaging studies  CBC: Recent Labs  Lab 03/01/21 0537 03/03/21 0448 03/04/21 0517 03/05/21 0508 03/06/21 0518  WBC 19.1* 11.2* 13.6* 12.4* 13.1*  HGB 10.2* 9.2* 10.4* 10.2* 11.4*  HCT 34.0* 30.8* 34.1* 33.3* 36.7  MCV 93.9 92.2 92.2 88.6 90.0  PLT 492* 483* 533* 494* 867*   Basic Metabolic Panel: Recent Labs  Lab 03/03/21 0448 03/04/21 0517 03/05/21 0508 03/06/21 0518 03/07/21 0447  NA 135 135 134* 137 135  K 4.5 4.3 4.0 3.9 3.4*  CL 97* 91* 89* 89* 87*  CO2 30 34* 35* 38* 37*  GLUCOSE 140* 148* 145* 142* 147*  BUN 29* 24* 24* 24* 27*  CREATININE 0.79 0.72 0.73 0.72 0.69  CALCIUM 8.6*  8.8* 8.6* 8.9 8.6*  MG  --  2.1 2.0 2.1 2.1   GFR: Estimated Creatinine Clearance: 73.6 mL/min (by C-G formula based on SCr of 0.69 mg/dL). Liver Function Tests: No results for input(s): AST, ALT, ALKPHOS, BILITOT, PROT, ALBUMIN in the last 168 hours. No results for input(s): LIPASE, AMYLASE in the last 168 hours. No results for input(s): AMMONIA in the last 168 hours. Coagulation Profile: No results for input(s): INR, PROTIME in the last 168 hours. Cardiac Enzymes: No results for input(s): CKTOTAL, CKMB, CKMBINDEX, TROPONINI in the last 168 hours. BNP (last 3 results) No results for input(s): PROBNP in the last 8760 hours. HbA1C: No results for input(s): HGBA1C in the last 72 hours. CBG: Recent Labs  Lab 02/28/21 1627  GLUCAP 173*   Lipid Profile: No results for input(s): CHOL, HDL, LDLCALC, TRIG, CHOLHDL,  LDLDIRECT in the last 72 hours. Thyroid Function Tests: No results for input(s): TSH, T4TOTAL, FREET4, T3FREE, THYROIDAB in the last 72 hours. Anemia Panel: No results for input(s): VITAMINB12, FOLATE, FERRITIN, TIBC, IRON, RETICCTPCT in the last 72 hours. Sepsis Labs: No results for input(s): PROCALCITON, LATICACIDVEN in the last 168 hours.  Recent Results (from the past 240 hour(s))  Resp Panel by RT-PCR (Flu A&B, Covid) Nasopharyngeal Swab     Status: None   Collection Time: 02/27/21  8:28 PM   Specimen: Nasopharyngeal Swab; Nasopharyngeal(NP) swabs in vial transport medium  Result Value Ref Range Status   SARS Coronavirus 2 by RT PCR NEGATIVE NEGATIVE Final    Comment: (NOTE) SARS-CoV-2 target nucleic acids are NOT DETECTED.  The SARS-CoV-2 RNA is generally detectable in upper respiratory specimens during the acute phase of infection. The lowest concentration of SARS-CoV-2 viral copies this assay can detect is 138 copies/mL. A negative result does not preclude SARS-Cov-2 infection and should not be used as the sole basis for treatment or other patient management decisions. A negative result may occur with  improper specimen collection/handling, submission of specimen other than nasopharyngeal swab, presence of viral mutation(s) within the areas targeted by this assay, and inadequate number of viral copies(<138 copies/mL). A negative result must be combined with clinical observations, patient history, and epidemiological information. The expected result is Negative.  Fact Sheet for Patients:  EntrepreneurPulse.com.au  Fact Sheet for Healthcare Providers:  IncredibleEmployment.be  This test is no t yet approved or cleared by the [place] FDA and  has been authorized for detection and/or diagnosis of SARS-CoV-2 by FDA under an Emergency Use Authorization (EUA). This EUA will remain  in effect (meaning this test can be used) for the  duration of the COVID-19 declaration under Section 564(b)(1) of the Act, 21 U.S.C.section 360bbb-3(b)(1), unless the authorization is terminated  or revoked sooner.       Influenza A by PCR NEGATIVE NEGATIVE Final   Influenza B by PCR NEGATIVE NEGATIVE Final    Comment: (NOTE) The Xpert Xpress SARS-CoV-2/FLU/RSV plus assay is intended as an aid in the diagnosis of influenza from Nasopharyngeal swab specimens and should not be used as a sole basis for treatment. Nasal washings and aspirates are unacceptable for Xpert Xpress SARS-CoV-2/FLU/RSV testing.  Fact Sheet for Patients: EntrepreneurPulse.com.au  Fact Sheet for Healthcare Providers: IncredibleEmployment.be  This test is not yet approved or cleared by the [place] FDA and has been authorized for detection and/or diagnosis of SARS-CoV-2 by FDA under an Emergency Use Authorization (EUA). This EUA will remain in effect (meaning this test can be used) for the duration of the COVID-19  declaration under Section 564(b)(1) of the Act, 21 U.S.C. section 360bbb-3(b)(1), unless the authorization is terminated or revoked.  Performed at [facility], [address] [place], [place] [address]   Blood culture (routine x 2)     Status: None   Collection Time: 02/28/21  1:25 AM   Specimen: Right Antecubital; Blood  Result Value Ref Range Status   Specimen Description RIGHT ANTECUBITAL  Final   Special Requests   Final    BOTTLES DRAWN AEROBIC AND ANAEROBIC Blood Culture adequate volume   Culture   Final    NO GROWTH 5 DAYS Performed at [facility], [address] [place], [place] [address]    Report Status 03/05/2021 FINAL  Final  Blood culture (routine x 2)     Status: None   Collection Time: 02/28/21  4:22 AM   Specimen: BLOOD LEFT HAND  Result Value Ref Range Status   Specimen Description   Final    BLOOD LEFT HAND BOTTLES DRAWN AEROBIC AND ANAEROBIC   Special Requests Blood Culture  adequate volume  Final   Culture   Final    NO GROWTH 5 DAYS Performed at [facility], [address], [place], [place] [address]    Report Status 03/05/2021 FINAL  Final  Culture, Respiratory w Gram Stain     Status: None   Collection Time: 02/28/21  2:34 PM   Specimen: SPU  Result Value Ref Range Status   Specimen Description   Final    SPUTUM Performed at [facility], [address] [place], [place] [address]    Special Requests   Final    NONE Performed at [facility], [address] [place], [place] [address]    Gram Stain RARE WBC PRESENT, PREDOMINANTLY PMN FEW YEAST   Final   Culture   Final    ABUNDANT Normal respiratory flora-no Staph aureus or Pseudomonas seen Performed at [facility] [address], [place], [place] [address]    Report Status 03/03/2021 FINAL  Final         Radiology Studies: No results found.      Scheduled Meds:  arformoterol  15 mcg Nebulization BID   aspirin EC  81 mg Oral Daily   budesonide (PULMICORT) nebulizer solution  0.5 mg Nebulization BID   dabigatran  150 mg Oral BID   dextromethorphan-guaiFENesin  1 tablet Oral BID   diltiazem  180 mg Oral Daily   nystatin  5 mL Oral QID   pantoprazole  40 mg Oral Daily   potassium chloride  40 mEq Oral BID   [START ON 03/08/2021] predniSONE  40 mg Oral Q breakfast   revefenacin  175 mcg Nebulization Daily   senna  1 tablet Oral Daily   Continuous Infusions:  sodium chloride 250 mL (03/03/21 2056)   cefTRIAXone (ROCEPHIN)  IV 1 g (03/06/21 2342)   diltiazem (CARDIZEM) infusion       LOS: 7 days    Time spent: 35 minutes    Chaelyn Bunyan Darleen Crocker, DO [facility]  If 7PM-7AM, please contact night-coverage [email or web address] 03/07/2021, 11:16 AM

## 2021-03-07 NOTE — Progress Notes (Signed)
   03/07/21 0036  Vitals  Temp 98.9 F (37.2 C)  Temp Source Oral  BP 114/69  MAP (mmHg) 82  BP Location Right Arm  BP Method Automatic  Patient Position (if appropriate) Lying  Pulse Rate (!) 133  Resp 18  Oxygen Therapy  SpO2 96 %  O2 Device Nasal Cannula  O2 Flow Rate (L/min) 2 L/min   Dr.Adefeso notified. Instructed to give Cardizem 30mg  PO now.

## 2021-03-07 NOTE — Progress Notes (Signed)
   03/07/21 1041  Assess: MEWS Score  BP 101/66  Pulse Rate (!) 118  Resp 20  Level of Consciousness Alert  SpO2 95 %  O2 Device Nasal Cannula  O2 Flow Rate (L/min) 2 L/min  Assess: MEWS Score  MEWS Temp 0  MEWS Systolic 0  MEWS Pulse 2  MEWS RR 0  MEWS LOC 0  MEWS Score 2  MEWS Score Color Yellow  Assess: if the MEWS score is Yellow or Red  Were vital signs taken at a resting state? Yes  Focused Assessment No change from prior assessment  Early Detection of Sepsis Score *See Row Information* Low  MEWS guidelines implemented *See Row Information* Yes  Treat  MEWS Interventions Escalated (See documentation below)  Pain Scale 0-10  Pain Score 0  Take Vital Signs  Increase Vital Sign Frequency  Yellow: Q 2hr X 2 then Q 4hr X 2, if remains yellow, continue Q 4hrs  Escalate  MEWS: Escalate Yellow: discuss with charge nurse/RN and consider discussing with provider and RRT (discussed with MD)  Notify: Charge Nurse/RN  Name of Charge Nurse/RN Notified Ruel Favors RN  Date Charge Nurse/RN Notified 03/07/21  Time Charge Nurse/RN Notified 1048  Notify: Provider  Provider Name/Title Dr. Brigitte Pulse  Date Provider Notified 03/07/21  Time Provider Notified 1049  Notification Type Page  Notification Reason Other (Comment) (Increased irregular HR)  Provider response See new orders  Date of Provider Response 03/07/21  Time of Provider Response 1049

## 2021-03-08 MED ORDER — SODIUM CHLORIDE 0.9 % IV BOLUS
500.0000 mL | Freq: Once | INTRAVENOUS | Status: AC
  Administered 2021-03-08: 500 mL via INTRAVENOUS

## 2021-03-08 NOTE — Progress Notes (Signed)
PROGRESS NOTE    Kathleen Wright  JHE:[id number] DOB: [birth year] DOA: 02/27/2021 PCP: Cory Munch, PA-C   Brief Narrative:   Kathleen Wright  is a 55 y.o. female, with history of asthma, COPD, hypertrophic cardiomyopathy, paroxysmal atrial fibrillation, and more presents the ED with a chief complaint of dyspnea.  Is been ongoing for 1 week.  Its been getting worse over the week.  She has been admitted for acute hypoxemic respiratory failure that is multifactorial and secondary to acute on chronic diastolic congestive heart failure as well as acute COPD exacerbation related to bronchiectasis/pneumonia.  She has now developed some atrial fibrillation with RVR with minimal symptoms.  Assessment & Plan:   Active Problems:   History of atrial fibrillation   Elevated troponin   COPD with acute exacerbation (HCC)   Protein-calorie malnutrition, mild (HCC)   Respiratory failure with hypoxia (HCC)   1-acute hypoxemic respiratory failure secondary to acute on chronic congestive heart failure (diastolic and right heart sided failure), bronchiectasis/pneumonia and COPD exacerbation. -Patient complaining of lower extremity edema, orthopnea, dyspnea on exertion and increased wheezing along with shortness of breath and the need of oxygen supplementation due to new development of hypoxia. -CT angiogram of the chest demonstrated no pulmonary embolism but had bronchial wall thickening and peribronchial nodular infiltrates representing multifocal pneumonia. -2D echo with positive grade 3 diastolic dysfunction, ejection fraction 65 to 70%, no wall motion abnormalities and the presence of low normal right ventricular systolic function. -Will continue treatment with IV diuresis (but dose will be adjusted, as patient currently demonstrating no crackles), continue low-sodium diet, daily weights and strict I's and O's.  Discontinued further Lasix dosing given atrial fibrillation with RVR and noted euvolemia.  She  was transferred to stepdown unit on 9/25 for atrial fibrillation with RVR, but now has stable heart rate control and may transfer back up to telemetry. -Continue also nebulizer management, IV steroids, mucolytic/antitussive regimen; and IV antibiotics (Rocephin and Zithromax).  Decreased steroids to 40 mg prednisone daily on 9/25 -Continue to wean off oxygen supplementation as tolerated; goal is for O2 sats above 90%.  -Continue the use of flutter valve and follow clinical response. -Given increased somnolence ABG has been checked; CO2 mildly elevated for someone with underlying history of COPD.  Oxygen saturation was 81 on patient's ABG; instructions given to nursing staff to decrease oxygen saturation to avoid CO2 retention. -Minimize the use of narcotics and avoid the use of benzodiazepines.  If patient's mentation failed to completely improve my injury requiring intermittent use of CPAP/BiPAP. -PT evaluation ordered with HHPT recommendation, plan to repeat evaluation once medically stable   2-History of atrial fibrillation, RVR resolved -Continue oral Cardizem as prior and transfer back to telemetry -Continue the use of Pradaxa for secondary prevention. -Resume home metoprolol, but at lower dose of 25 mg twice daily   3-Elevated troponin -In the setting of demand ischemia secondary to ongoing congestive heart failure exacerbation and pulmonary process. -Patient denies chest pain -No ischemic changes on EKG at time of admission or telemetry evaluation.   4-class II obesity -Body mass index is 37.53 kg/m. -Low calorie diet, portion control and increase physical activity discussed with patient. -Will benefit of outpatient sleep study.   5-gastroesophageal reflux disease -Continue PPI.   6-hospital-acquired delirium-improving -Plan to decrease steroids as this may be contributing a component of psychosis -Hold off on Seroquel for now given some increased somnolence   7-Oral  thrush -Secondary to steroid use -Started on nystatin swish  and swallow 9/23     DVT prophylaxis: Pradaxa Code Status: Full Family Communication: Daughter on phone 9/26 Disposition Plan:  Status is: Inpatient   Remains inpatient appropriate because:Altered mental status, IV treatments appropriate due to intensity of illness or inability to take PO, and Inpatient level of care appropriate due to severity of illness   Dispo: The patient is from: Home              Anticipated d/c is to: Home              Patient currently is not medically stable to d/c.              Difficult to place patient No     Consultants:  None   Procedures:  See below  Antimicrobials:  Anti-infectives (From admission, onward)    Start     Dose/Rate Route Frequency Ordered Stop   03/01/21 0000  cefTRIAXone (ROCEPHIN) 1 g in sodium chloride 0.9 % 100 mL IVPB  Status:  Discontinued        1 g 200 mL/hr over 30 Minutes Intravenous Every 24 hours 02/28/21 0259 03/07/21 1228   02/28/21 0000  cefTRIAXone (ROCEPHIN) 1 g in sodium chloride 0.9 % 100 mL IVPB        1 g 200 mL/hr over 30 Minutes Intravenous  Once 02/27/21 2358 02/28/21 0107   02/28/21 0000  azithromycin (ZITHROMAX) 500 mg in sodium chloride 0.9 % 250 mL IVPB  Status:  Discontinued        500 mg 250 mL/hr over 60 Minutes Intravenous Every 24 hours 02/27/21 2358 03/04/21 1251       Subjective: Patient seen and evaluated today with no new acute complaints or concerns. No acute concerns or events noted overnight.  Her heart rate is better controlled and she is able to tolerate walking without oxygen support.  She does claim to still have some dizziness that is bothering her as well as shakiness.  Objective: Vitals:   03/08/21 1010 03/08/21 1100 03/08/21 1102 03/08/21 1115  BP: 96/63  (!) 97/49 100/68  Pulse: 91 78  90  Resp:  (!) 21 20 15   Temp:  98.6 F (37 C)    TempSrc:  Oral    SpO2:  92%  92%  Weight:      Height:         Intake/Output Summary (Last 24 hours) at 03/08/2021 1134 Last data filed at 03/07/2021 1842 Gross per 24 hour  Intake 12.59 ml  Output 400 ml  Net -387.41 ml   Filed Weights   03/07/21 0504 03/07/21 1143 03/08/21 0246  Weight: 76.8 kg 75.7 kg 77.6 kg    Examination:  General exam: Appears calm and comfortable  Respiratory system: Clear to auscultation. Respiratory effort normal. Cardiovascular system: S1 & S2 heard, irregular Gastrointestinal system: Abdomen is soft Central nervous system: Alert and awake Extremities: No edema Skin: No significant lesions noted Psychiatry: Flat affect.    Data Reviewed: I have personally reviewed following labs and imaging studies  CBC: Recent Labs  Lab 03/03/21 0448 03/04/21 0517 03/05/21 0508 03/06/21 0518  WBC 11.2* 13.6* 12.4* 13.1*  HGB 9.2* 10.4* 10.2* 11.4*  HCT 30.8* 34.1* 33.3* 36.7  MCV 92.2 92.2 88.6 90.0  PLT 483* 533* 494* 735*   Basic Metabolic Panel: Recent Labs  Lab 03/03/21 0448 03/04/21 0517 03/05/21 0508 03/06/21 0518 03/07/21 0447  NA 135 135 134* 137 135  K 4.5 4.3 4.0 3.9 3.4*  CL 97* 91* 89* 89* 87*  CO2 30 34* 35* 38* 37*  GLUCOSE 140* 148* 145* 142* 147*  BUN 29* 24* 24* 24* 27*  CREATININE 0.79 0.72 0.73 0.72 0.69  CALCIUM 8.6* 8.8* 8.6* 8.9 8.6*  MG  --  2.1 2.0 2.1 2.1   GFR: Estimated Creatinine Clearance: 74 mL/min (by C-G formula based on SCr of 0.69 mg/dL). Liver Function Tests: No results for input(s): AST, ALT, ALKPHOS, BILITOT, PROT, ALBUMIN in the last 168 hours. No results for input(s): LIPASE, AMYLASE in the last 168 hours. No results for input(s): AMMONIA in the last 168 hours. Coagulation Profile: No results for input(s): INR, PROTIME in the last 168 hours. Cardiac Enzymes: No results for input(s): CKTOTAL, CKMB, CKMBINDEX, TROPONINI in the last 168 hours. BNP (last 3 results) No results for input(s): PROBNP in the last 8760 hours. HbA1C: No results for input(s): HGBA1C  in the last 72 hours. CBG: No results for input(s): GLUCAP in the last 168 hours. Lipid Profile: No results for input(s): CHOL, HDL, LDLCALC, TRIG, CHOLHDL, LDLDIRECT in the last 72 hours. Thyroid Function Tests: No results for input(s): TSH, T4TOTAL, FREET4, T3FREE, THYROIDAB in the last 72 hours. Anemia Panel: No results for input(s): VITAMINB12, FOLATE, FERRITIN, TIBC, IRON, RETICCTPCT in the last 72 hours. Sepsis Labs: No results for input(s): PROCALCITON, LATICACIDVEN in the last 168 hours.  Recent Results (from the past 240 hour(s))  Resp Panel by RT-PCR (Flu A&B, Covid) Nasopharyngeal Swab     Status: None   Collection Time: 02/27/21  8:28 PM   Specimen: Nasopharyngeal Swab; Nasopharyngeal(NP) swabs in vial transport medium  Result Value Ref Range Status   SARS Coronavirus 2 by RT PCR NEGATIVE NEGATIVE Final    Comment: (NOTE) SARS-CoV-2 target nucleic acids are NOT DETECTED.  The SARS-CoV-2 RNA is generally detectable in upper respiratory specimens during the acute phase of infection. The lowest concentration of SARS-CoV-2 viral copies this assay can detect is 138 copies/mL. A negative result does not preclude SARS-Cov-2 infection and should not be used as the sole basis for treatment or other patient management decisions. A negative result may occur with  improper specimen collection/handling, submission of specimen other than nasopharyngeal swab, presence of viral mutation(s) within the areas targeted by this assay, and inadequate number of viral copies(<138 copies/mL). A negative result must be combined with clinical observations, patient history, and epidemiological information. The expected result is Negative.  Fact Sheet for Patients:  EntrepreneurPulse.com.au  Fact Sheet for Healthcare Providers:  IncredibleEmployment.be  This test is no t yet approved or cleared by the [place] FDA and  has been authorized for detection  and/or diagnosis of SARS-CoV-2 by FDA under an Emergency Use Authorization (EUA). This EUA will remain  in effect (meaning this test can be used) for the duration of the COVID-19 declaration under Section 564(b)(1) of the Act, 21 U.S.C.section 360bbb-3(b)(1), unless the authorization is terminated  or revoked sooner.       Influenza A by PCR NEGATIVE NEGATIVE Final   Influenza B by PCR NEGATIVE NEGATIVE Final    Comment: (NOTE) The Xpert Xpress SARS-CoV-2/FLU/RSV plus assay is intended as an aid in the diagnosis of influenza from Nasopharyngeal swab specimens and should not be used as a sole basis for treatment. Nasal washings and aspirates are unacceptable for Xpert Xpress SARS-CoV-2/FLU/RSV testing.  Fact Sheet for Patients: EntrepreneurPulse.com.au  Fact Sheet for Healthcare Providers: IncredibleEmployment.be  This test is not yet approved or cleared by the [place] FDA  and has been authorized for detection and/or diagnosis of SARS-CoV-2 by FDA under an Emergency Use Authorization (EUA). This EUA will remain in effect (meaning this test can be used) for the duration of the COVID-19 declaration under Section 564(b)(1) of the Act, 21 U.S.C. section 360bbb-3(b)(1), unless the authorization is terminated or revoked.  Performed at [facility], [address] [place], [place] [address]   Blood culture (routine x 2)     Status: None   Collection Time: 02/28/21  1:25 AM   Specimen: Right Antecubital; Blood  Result Value Ref Range Status   Specimen Description RIGHT ANTECUBITAL  Final   Special Requests   Final    BOTTLES DRAWN AEROBIC AND ANAEROBIC Blood Culture adequate volume   Culture   Final    NO GROWTH 5 DAYS Performed at [facility], [address], [place], [place] [address]    Report Status 03/05/2021 FINAL  Final  Blood culture (routine x 2)     Status: None   Collection Time: 02/28/21  4:22 AM   Specimen: BLOOD LEFT HAND   Result Value Ref Range Status   Specimen Description   Final    BLOOD LEFT HAND BOTTLES DRAWN AEROBIC AND ANAEROBIC   Special Requests Blood Culture adequate volume  Final   Culture   Final    NO GROWTH 5 DAYS Performed at [facility], [address] [place], [place] [address]    Report Status 03/05/2021 FINAL  Final  Culture, Respiratory w Gram Stain     Status: None   Collection Time: 02/28/21  2:34 PM   Specimen: SPU  Result Value Ref Range Status   Specimen Description   Final    SPUTUM Performed at [facility], [address], [place], [place] [address]    Special Requests   Final    NONE Performed at [facility], [address], [place], [place] [address]    Gram Stain RARE WBC PRESENT, PREDOMINANTLY PMN FEW YEAST   Final   Culture   Final    ABUNDANT Normal respiratory flora-no Staph aureus or Pseudomonas seen Performed at [facility] [address], [place], [place] [address]    Report Status 03/03/2021 FINAL  Final  MRSA Next Gen by PCR, Nasal     Status: None   Collection Time: 03/07/21 11:50 AM   Specimen: Nasal Mucosa; Nasal Swab  Result Value Ref Range Status   MRSA by PCR Next Gen NOT DETECTED NOT DETECTED Final    Comment: (NOTE) The GeneXpert MRSA Assay (FDA approved for NASAL specimens only), is one component of a comprehensive MRSA colonization surveillance program. It is not intended to diagnose MRSA infection nor to guide or monitor treatment for MRSA infections. Test performance is not FDA approved in patients less than 63 years old. Performed at [facility], [address], [place], [place] [address]          Radiology Studies: No results found.      Scheduled Meds:  arformoterol  15 mcg Nebulization BID   aspirin EC  81 mg Oral Daily   Chlorhexidine Gluconate Cloth  6 each Topical Q0600   dabigatran  150 mg Oral BID   dextromethorphan-guaiFENesin  1 tablet Oral BID   diltiazem  180 mg Oral Daily   metoprolol tartrate  25  mg Oral BID   nystatin  5 mL Oral QID   pantoprazole  40 mg Oral Daily   potassium chloride  40 mEq Oral BID  predniSONE  40 mg Oral Q breakfast   revefenacin  175 mcg Nebulization Daily   senna  1 tablet Oral Daily   Continuous Infusions:  sodium chloride 250 mL (03/03/21 2056)   diltiazem (CARDIZEM) infusion Stopped (03/07/21 1427)     LOS: 8 days    Time spent: 35 minutes    Evey Mcmahan Darleen Crocker, DO [facility]  If 7PM-7AM, please contact night-coverage [email or web address] 03/08/2021, 11:34 AM

## 2021-03-08 NOTE — Progress Notes (Addendum)
Physical Therapy Treatment Patient Details Name: Kathleen Wright MRN: [id number] DOB: August 31, 1965 Today's Date: 03/08/2021   History of Present Illness Kathleen Wright  is a 55 y.o. female, with history of asthma, COPD, hypertrophic cardiomyopathy, paroxysmal atrial fibrillation, and more presents the ED with a chief complaint of dyspnea.  Is been ongoing for 1 week.  Its been getting worse over the week.  She reports that it is worse with exertion, she also has orthopnea.  She reports that she has had orthopnea since she had open heart surgery.  That part is not new.  She has peripheral edema with left greater than right.  That has been going on for longer than 1 week.  She reports a productive cough with green/white/brown [place].  This is a change in color as her normal sputum is clear or white.  She denies any fevers.  She has tried albuterol and ipratropium 2 times today without relief prior to presenting to the ED.  She reports a chest tightness that is diffuse across her chest.  That is been going on for 2 days.  It is also worse with exertion.  She does not require oxygen at baseline, but she is requiring oxygen today.  Patient reports that she has no other complaints at this time.    PT Comments    REASSESSMENT:  Patient demonstrates good return for transferring to from bedside, commode, chair and ambulating in room without loss of balance using RW.  Patient's SpO2 dropped to 85% when walking on room air, maintained at 93-96% with 2 LPM O2 during gait training, limited mostly due to fatigue and mild SOB.  Patient tolerated sitting up in chair after therapy.  Patient will benefit from continued physical therapy in hospital and recommended venue below to increase strength, balance, endurance for safe ADLs and gait.     Recommendations for follow up therapy are one component of a multi-disciplinary discharge planning process, led by the attending physician.  Recommendations may be updated based on  patient status, additional functional criteria and insurance authorization.  Follow Up Recommendations  Home health PT;Supervision - Intermittent     Equipment Recommendations  3in1 (PT)    Recommendations for Other Services       Precautions / Restrictions Precautions Precautions: Fall Restrictions Weight Bearing Restrictions: No     Mobility  Bed Mobility Overal bed mobility: Modified Independent                  Transfers Overall transfer level: Modified independent               General transfer comment: good return transferring to/from commode, bedside and chair  Ambulation/Gait Ambulation/Gait assistance: Modified independent (Device/Increase time);Supervision Gait Distance (Feet): 22 Feet Assistive device: Rolling walker (2 wheeled) Gait Pattern/deviations: Decreased step length - right;Decreased step length - left;Decreased stride length Gait velocity: decreased   General Gait Details: slow labored cadence without loss of balance, on 2 LPM with SpO2 remaining at 93-96%   Stairs             Wheelchair Mobility    Modified Rankin (Stroke Patients Only)       Balance Overall balance assessment: Needs assistance Sitting-balance support: Feet supported;No upper extremity supported   Sitting balance - Comments: seated EOB   Standing balance support: During functional activity;Bilateral upper extremity supported Standing balance-Leahy Scale: Good Standing balance comment: with RW  Cognition Arousal/Alertness: Awake/alert Behavior During Therapy: WFL for tasks assessed/performed Overall Cognitive Status: Within Functional Limits for tasks assessed                                        Exercises      General Comments        Pertinent Vitals/Pain Pain Assessment: No/denies pain    Home Living                      Prior Function            PT Goals (current  goals can now be found in the care plan section) Acute Rehab PT Goals Patient Stated Goal: return home PT Goal Formulation: With patient Time For Goal Achievement: 03/18/21 Potential to Achieve Goals: Good Progress towards PT goals: Progressing toward goals    Frequency    Min 3X/week      PT Plan Current plan remains appropriate    Co-evaluation              AM-PAC PT "6 Clicks" Mobility   Outcome Measure  Help needed turning from your back to your side while in a flat bed without using bedrails?: None Help needed moving from lying on your back to sitting on the side of a flat bed without using bedrails?: None Help needed moving to and from a bed to a chair (including a wheelchair)?: A Little Help needed standing up from a chair using your arms (e.g., wheelchair or bedside chair)?: A Little Help needed to walk in hospital room?: A Little Help needed climbing 3-5 steps with a railing? : A Lot 6 Click Score: 19    End of Session Equipment Utilized During Treatment: Oxygen Activity Tolerance: Patient tolerated treatment well;Patient limited by fatigue Patient left: in chair;with call bell/phone within reach Nurse Communication: Mobility status PT Visit Diagnosis: Unsteadiness on feet (R26.81);Other abnormalities of gait and mobility (R26.89);Muscle weakness (generalized) (M62.81)     Time: 9507-2257 PT Time Calculation (min) (ACUTE ONLY): 20 min  Charges:  $Gait Training: 8-22 mins $Therapeutic Exercise: 8-22 mins                     9:56 AM, 03/08/21 Lonell Grandchild, MPT Physical Therapist with [facility] [phone] office [phone] mobile phone

## 2021-03-08 NOTE — Progress Notes (Signed)
Received report from [place], [place] from ICU. Pt arrived to unit in stable condition. A&O. RA. VSS. Granddaughter accompanied pt. Denies pain. Lungs CTA. Bowel sounds present. LBM 03/08/21. Tolerating diet. Call bell within reach. Will continue to monitor.

## 2021-03-08 NOTE — TOC Progression Note (Signed)
Transition of Care [facility]) - Progression Note    Patient Details  Name: Kathleen Wright MRN: [id number] Date of Birth: August 26, 1965  Transition of Care [facility]) CM/SW Contact  Shade Flood, LCSW Phone Number: 03/08/2021, 1:49 PM  Clinical Narrative:     Pt has insurance auth for SNF rehab. Spoke with pt about bed offers. She [facility].   Updated Debbie at [facility]. Anticipating dc tomorrow.  Will follow.  Expected Discharge Plan: [place] Barriers to Discharge: Continued Medical Work up  Expected Discharge Plan and Services Expected Discharge Plan: [place] In-house Referral: Clinical Social Work   Post Acute Care Choice: [place] Living arrangements for the past 2 months: Single Family Home                 DME Arranged: N/A                     Social Determinants of Health (SDOH) Interventions    Readmission Risk Interventions No flowsheet data found.

## 2021-03-08 NOTE — Progress Notes (Signed)
Patient placed on CPAP with nasal mask.  Patient tolerating well at this time.  RT will continue to monitor.

## 2021-03-08 NOTE — TOC Progression Note (Signed)
Transition of Care [facility]) - Progression Note    Patient Details  Name: JORDY HEWINS MRN: [id number] Date of Birth: [birth year]  Transition of [facility]) CM/SW Contact  Shade Flood, LCSW Phone Number: 03/08/2021, 11:46 AM  Clinical Narrative:     TOC following. Pt does have SNF bed offers. Awaiting insurance determination for authorization. MD anticipating dc soon. TOC will follow.  Expected Discharge Plan: [place] Barriers to Discharge: Continued Medical Work up  Expected Discharge Plan and Services Expected Discharge Plan: [place] In-house Referral: Clinical Social Work   Post Acute Care Choice: [place] Living arrangements for the past 2 months: Single Family Home                 DME Arranged: N/A                     Social Determinants of Health (SDOH) Interventions    Readmission Risk Interventions No flowsheet data found.

## 2021-03-09 DIAGNOSIS — J441 Chronic obstructive pulmonary disease with (acute) exacerbation: Secondary | ICD-10-CM | POA: Diagnosis not present

## 2021-03-09 DIAGNOSIS — E46 Unspecified protein-calorie malnutrition: Secondary | ICD-10-CM | POA: Diagnosis not present

## 2021-03-09 DIAGNOSIS — I5032 Chronic diastolic (congestive) heart failure: Secondary | ICD-10-CM | POA: Diagnosis not present

## 2021-03-09 DIAGNOSIS — I421 Obstructive hypertrophic cardiomyopathy: Secondary | ICD-10-CM | POA: Diagnosis not present

## 2021-03-09 DIAGNOSIS — J45998 Other asthma: Secondary | ICD-10-CM | POA: Diagnosis not present

## 2021-03-09 DIAGNOSIS — J9601 Acute respiratory failure with hypoxia: Secondary | ICD-10-CM | POA: Diagnosis not present

## 2021-03-09 DIAGNOSIS — I480 Paroxysmal atrial fibrillation: Secondary | ICD-10-CM | POA: Diagnosis not present

## 2021-03-09 DIAGNOSIS — Z23 Encounter for immunization: Secondary | ICD-10-CM | POA: Diagnosis not present

## 2021-03-09 LAB — BASIC METABOLIC PANEL
Anion gap: 7 (ref 5–15)
BUN: 27 mg/dL — ABNORMAL HIGH (ref 6–20)
CO2: 29 mmol/L (ref 22–32)
Calcium: 8.3 mg/dL — ABNORMAL LOW (ref 8.9–10.3)
Chloride: 97 mmol/L — ABNORMAL LOW (ref 98–111)
Creatinine, Ser: 0.67 mg/dL (ref 0.44–1.00)
GFR, Estimated: >60 mL/min (ref >60)
Glucose, Bld: 91 mg/dL (ref 70–99)
Potassium: 4.9 mmol/L (ref 3.5–5.1)
Sodium: 133 mmol/L — ABNORMAL LOW (ref 135–145)

## 2021-03-09 LAB — CBC
HCT: 37.4 % (ref 36.0–46.0)
Hemoglobin: 11.4 g/dL — ABNORMAL LOW (ref 12.0–15.0)
MCH: 27 pg (ref 26.0–34.0)
MCHC: 30.5 g/dL (ref 30.0–36.0)
MCV: 88.4 fL (ref 80.0–100.0)
Platelets: 351 10*3/uL (ref 150–400)
RBC: 4.23 MIL/uL (ref 3.87–5.11)
RDW: 17.1 % — ABNORMAL HIGH (ref 11.5–15.5)
WBC: 15.6 10*3/uL — ABNORMAL HIGH (ref 4.0–10.5)
nRBC: 0 % (ref 0.0–0.2)

## 2021-03-09 LAB — MAGNESIUM: Magnesium: 2.1 mg/dL (ref 1.7–2.4)

## 2021-03-09 MED ORDER — NYSTATIN 100000 UNIT/ML MT SUSP: 5.0000 mL | Freq: Four times a day (QID) | OROMUCOSAL | 0 refills | Status: AC

## 2021-03-09 MED ORDER — ALPRAZOLAM 0.25 MG PO TABS: 0.2500 mg | ORAL_TABLET | Freq: Three times a day (TID) | ORAL | 0 refills | Status: AC | PRN

## 2021-03-09 MED ORDER — OXYCODONE HCL 5 MG PO TABS: 5.0000 mg | ORAL_TABLET | Freq: Four times a day (QID) | ORAL | 0 refills | Status: DC | PRN

## 2021-03-09 MED ORDER — FUROSEMIDE 20 MG PO TABS: 20.0000 mg | ORAL_TABLET | Freq: Every day | ORAL | 0 refills | Status: DC | PRN

## 2021-03-09 MED ORDER — METOPROLOL TARTRATE 25 MG PO TABS: 25.0000 mg | ORAL_TABLET | Freq: Two times a day (BID) | ORAL | 2 refills | Status: DC

## 2021-03-09 MED ORDER — PREDNISONE 20 MG PO TABS: 40.0000 mg | ORAL_TABLET | Freq: Every day | ORAL | 0 refills | Status: AC

## 2021-03-09 NOTE — TOC Transition Note (Signed)
Transition of Care [facility]) - CM/SW Discharge Note   Patient Details  Name: MARKISHA MEDING MRN: [id number] Date of Birth: July 08, 1965  Transition of Care [facility]) CM/SW Contact:  Shade Flood, LCSW Phone Number: 03/09/2021, 11:25 AM   Clinical Narrative:     Pt stable for dc today per MD. Damaris Schooner with pt and plan remains for dc to [facility] for short term rehab. Updated Debbie at [facility]. They can accept pt today after 2pm.   DC clinical sent electronically. RN to call report. Daughter will transport.  There are no other TOC needs for dc.  Final next level of care: Skilled Nursing Facility Barriers to Discharge: Barriers Resolved   Patient Goals and CMS Choice Patient states their goals for this hospitalization and ongoing recovery are:: rehab CMS Medicare.gov Compare Post Acute Care list provided to:: Patient Choice offered to / list presented to : Patient  Discharge Placement              Patient chooses bed at: Other - please specify in the comment section below: ([facility]) Patient to be transferred to facility by: family car Name of family member notified: Anderson [place] Patient and family notified of of transfer: 03/09/21  Discharge Plan and Services In-house Referral: Clinical Social Work   Post Acute Care Choice: [place]          DME Arranged: N/A                    Social Determinants of Health (SDOH) Interventions     Readmission Risk Interventions No flowsheet data found.

## 2021-03-09 NOTE — Care Management Important Message (Signed)
Important Message  Patient Details  Name: Kathleen Wright MRN: [id number] Date of Birth: 1965-07-08   Medicare Important Message Given:  Yes     Tommy Medal 03/09/2021, 1:19 PM

## 2021-03-09 NOTE — Progress Notes (Signed)
Discharge instructions provided to patient with daughter at bedside. Both verbalized understanding of discharge instructions. Both aware that patient is able to go to [facility] after 2 pm today. No further question.

## 2021-03-09 NOTE — Discharge Summary (Signed)
Physician Discharge Summary  Kathleen Wright JJK:[id number] DOB: 08-Apr-1966 DOA: 02/27/2021  PCP: Cory Munch, PA-C  Admit date: 02/27/2021  Discharge date: 03/09/2021  Admitted From:Home  Disposition:  SNF  Recommendations for Outpatient Follow-up:  Follow up with PCP in 1-2 weeks Referral to pulmonology outpatient follow-up for COPD as well as set up of sleep study Referral to cardiology locally for follow-up regarding atrial fibrillation/heart failure Continue on prednisone for 2 more days as well as nystatin Lasix prescribed as needed for edema Continue other home medications as prior  Home Health: None  Equipment/Devices: None  Discharge Condition:Stable  CODE STATUS: Full  Diet recommendation: Heart Healthy  Brief/Interim Summary: Kathleen Wright  is a 55 y.o. female, with history of asthma, COPD, hypertrophic cardiomyopathy, paroxysmal atrial fibrillation, and more presents the ED with a chief complaint of dyspnea.  Her symptoms have been ongoing for approximately 1 week and were worsening.  She was admitted for acute hypoxemic respiratory failure that was multifactorial in the setting of acute on chronic diastolic congestive heart failure exacerbation as well as COPD exacerbation related to bronchiectasis/pneumonia.  She was treated with IV steroids as well as breathing treatments along with Rocephin and Zithromax for the pneumonia.  She was also started on IV diuresis with Lasix and needed several days of hospitalization prior to reaching euvolemia.  She did have an episode of atrial fibrillation with RVR on 9/24 and was transferred to stepdown unit on 9/25 for brief initiation of Cardizem drip.  She stabilized shortly after this and was seen by physical therapy with recommendations for home health PT, but patient felt more comfortable with SNF on discharge and therefore she will be discharged to [facility].  She was noted to have some delirium during the hospitalization that  was related to her steroids which improved after tapering.  She also developed some subsequent oral thrush which was treated with nystatin swish and swallow.  She is currently in stable condition for discharge and is eager for SNF/rehab.  Discharge Diagnoses:  Active Problems:   History of atrial fibrillation   Elevated troponin   COPD with acute exacerbation (HCC)   Protein-calorie malnutrition, mild (HCC)   Respiratory failure with hypoxia (HCC)  Principal discharge diagnosis: Acute hypoxemic respiratory failure-multifactorial secondary to acute on chronic diastolic congestive heart failure exacerbation as well as COPD exacerbation related to community-acquired pneumonia.  Discharge Instructions  Discharge Instructions     Ambulatory referral to Cardiology   Complete by: As directed    Ambulatory referral to Pulmonology   Complete by: As directed    Sleep study.   Reason for referral: Asthma/COPD   Diet - low sodium heart healthy   Complete by: As directed    Increase activity slowly   Complete by: As directed       Allergies as of 03/09/2021       Reactions   Amiodarone Other (See Comments)   Liver function        Medication List     STOP taking these medications    azithromycin 250 MG tablet Commonly known as: ZITHROMAX   HYDROcodone-acetaminophen 10-325 MG tablet Commonly known as: NORCO   ibuprofen 800 MG tablet Commonly known as: ADVIL       TAKE these medications    ALPRAZolam 0.25 MG tablet Commonly known as: Xanax Take 1 tablet (0.25 mg total) by mouth 3 (three) times daily as needed for anxiety.   aspirin EC 81 MG tablet Take 81 mg by  mouth daily.   dabigatran 150 MG Caps capsule Commonly known as: Pradaxa Take 1 capsule (150 mg total) by mouth 2 (two) times daily.   diltiazem 180 MG 24 hr capsule Commonly known as: CARDIZEM CD Take 180 mg by mouth daily.   diltiazem 30 MG tablet Commonly known as: CARDIZEM TAKE (1) TABLET BY MOUTH  EVERY FOUR HOURS AS NEEDED FOR AFIB HEART RATE OVER 100. What changed:  how much to take how to take this when to take this reasons to take this additional instructions   furosemide 20 MG tablet Commonly known as: LASIX Take 1 tablet (20 mg total) by mouth daily as needed for edema.   ipratropium 0.02 % nebulizer solution Commonly known as: ATROVENT Take 0.25 mg by nebulization every 4 (four) hours as needed for wheezing or shortness of breath.   metoprolol tartrate 25 MG tablet Commonly known as: LOPRESSOR Take 1 tablet (25 mg total) by mouth 2 (two) times daily. What changed:  medication strength how much to take   nystatin 100000 UNIT/ML suspension Commonly known as: MYCOSTATIN Take 5 mLs (500,000 Units total) by mouth 4 (four) times daily for 3 days.   omeprazole 40 MG capsule Commonly known as: PRILOSEC Take 40 mg by mouth daily.   oxyCODONE 5 MG immediate release tablet Commonly known as: Oxy IR/ROXICODONE Take 1 tablet (5 mg total) by mouth every 6 (six) hours as needed for moderate pain, breakthrough pain or severe pain.   potassium chloride 10 MEQ tablet Commonly known as: KLOR-CON Take 10 mEq by mouth daily as needed (when taking lasix).   predniSONE 20 MG tablet Commonly known as: DELTASONE Take 2 tablets (40 mg total) by mouth daily with breakfast for 2 days.   albuterol (2.5 MG/3ML) 0.083% nebulizer solution Commonly known as: PROVENTIL Take 2.5 mg by nebulization every 6 (six) hours as needed for wheezing or shortness of breath.   ProAir HFA 108 (90 Base) MCG/ACT inhaler Generic drug: albuterol Inhale 2 puffs into the lungs every 6 (six) hours as needed for wheezing or shortness of breath.   triamcinolone cream 0.1 % Commonly known as: KENALOG Apply 1 application topically daily as needed (Blister).   varenicline 1 MG tablet Commonly known as: CHANTIX Take 1 mg by mouth daily.        Contact information for follow-up providers     Cory Munch, PA-C. Schedule an appointment as soon as possible for a visit in 2 week(s).   Specialties: Physician Assistant, Internal Medicine Contact information: [address] [place] [address] [phone]              Contact information for after-discharge care     [facility] .   Service: Skilled Nursing Contact information: [place] [address] [phone]                    Allergies  Allergen Reactions   Amiodarone Other (See Comments)    Liver function    Consultations: None   Procedures/Studies: CT Angio Chest PE W and/or Wo Contrast  Result Date: 02/28/2021 CLINICAL DATA:  Pulmonary embolus suspected with high probability. Shortness of breath for 1 week. Cough. EXAM: CT ANGIOGRAPHY CHEST WITH CONTRAST TECHNIQUE: Multidetector CT imaging of the chest was performed using the standard protocol during bolus administration of intravenous contrast. Multiplanar CT image reconstructions and MIPs were obtained to evaluate the vascular anatomy. CONTRAST:  49mL OMNIPAQUE IOHEXOL 350 MG/ML SOLN COMPARISON:  Chest radiograph 02/27/2021 FINDINGS: Cardiovascular: Good opacification of the central and segmental pulmonary arteries. No focal filling defects are demonstrated. No evidence of significant pulmonary embolus. Cardiac enlargement with particular prominence of the right heart and left atrium. No pericardial effusions. Coronary artery and mild aortic calcification. Normal caliber thoracic aorta. No evidence of aortic dissection. Great vessel origins are patent. Mediastinum/Nodes: Thyroid gland is unremarkable. Moderate prominence of mediastinal lymph nodes without pathologic enlargement. Esophagus is decompressed. Lungs/Pleura: Motion artifact limits evaluation. There is bronchial wall thickening with peribronchial nodular airspace infiltrates most prominent in the perihilar  regions bilaterally. Changes are most likely to represent multifocal pneumonia, bronchopneumonia, or other airways disease. No pleural effusions. No pneumothorax. Upper Abdomen: Reflux of contrast material into the hepatic veins may indicate right heart failure. Musculoskeletal: Postoperative changes in the mediastinum with sternotomy wires present. Degenerative changes in the spine. Review of the MIP images confirms the above findings. IMPRESSION: 1. No evidence of significant pulmonary embolus. 2. Cardiac enlargement with evidence of right heart failure. 3. Bronchial wall thickening with peribronchial nodular airspace infiltrates likely representing multifocal pneumonia or airways disease. Electronically Signed   By: Lucienne Capers M.D.   On: 02/28/2021 00:39   DG Chest Port 1 View  Result Date: 02/27/2021 CLINICAL DATA:  Cough. Shortness of breath for 1 week. Former smoker. EXAM: PORTABLE CHEST 1 VIEW COMPARISON:  12/27/2015 FINDINGS: Postoperative changes in the mediastinum. Mild cardiac enlargement. No airspace disease or consolidation in the lungs. No pleural effusions. No pneumothorax. Mediastinal contours appear intact. Degenerative changes in the shoulders. IMPRESSION: Mild cardiac enlargement.  No evidence of active pulmonary disease. Electronically Signed   By: Lucienne Capers M.D.   On: 02/27/2021 20:23   ECHOCARDIOGRAM COMPLETE  Result Date: 02/28/2021    ECHOCARDIOGRAM REPORT   Patient Name:   ELLA GOLOMB Date of Exam: 02/28/2021 Medical Rec #:  [id number]       Height:       61.0 in Accession #:    [id number]      Weight:       180.0 lb Date of Birth:  1965/09/25       BSA:          1.806 m Patient Age:    55 years        BP:           102/72 mmHg Patient Gender: F               HR:           52 bpm. Exam Location:  [facility] Procedure: 2D Echo, Cardiac Doppler and Color Doppler Indications:    Congestive Heart Failure I50.9  History:        Patient has prior history of Echocardiogram  examinations, most                 recent 11/30/2018. COPD and TIA; Arrythmias:Atrial Flutter and                 Atrial Fibrillation. Hypertrophic obstructive cardiomyopathy,                 History                 of ventricular septal myectomy, Cervical cancer, Respiratory                 failure with hypoxia ([place]), Tobacco abuse.  Sonographer:    Alvino Chapel RCS  Referring Phys: [id number] ASIA B [place]  1. Left ventricular ejection fraction, by estimation, is 65 to 70%. The left ventricle has normal function. The left ventricle has no regional wall motion abnormalities. There is moderate asymmetric left ventricular hypertrophy of the septal segment. Left ventricular diastolic parameters are consistent with Grade III diastolic dysfunction (restrictive).  2. Right ventricular systolic function is low normal. The right ventricular size is mildly enlarged. Mildly increased right ventricular wall thickness. There is mildly elevated pulmonary artery systolic pressure. The estimated right ventricular systolic  pressure is 01.7 mmHg.  3. Left atrial size was severely dilated.  4. Right atrial size was moderately dilated.  5. Moderate pericardial effusion. The pericardial effusion is posterior to the left ventricle.  6. The mitral valve is abnormal. Moderate mitral valve regurgitation. Moderate mitral annular calcification.  7. Tricuspid valve regurgitation is mild to moderate.  8. The aortic valve is tricuspid. Aortic valve regurgitation is not visualized.  9. The inferior vena cava is normal in size with greater than 50% respiratory variability, suggesting right atrial pressure of 3 mmHg. Comparison(s): Prior images reviewed side by side. LVEF remains normal. There is moderate LVH including septal hypertrophy related to [place]. Patient reported to be status post septal myomectomy. No obvious LVOT gradient at rest. FINDINGS  Left Ventricle: Left ventricular ejection fraction, by estimation, is 65 to 70%.  The left ventricle has normal function. The left ventricle has no regional wall motion abnormalities. The left ventricular internal cavity size was normal in size. There is  moderate asymmetric left ventricular hypertrophy of the septal segment. Left ventricular diastolic parameters are consistent with Grade III diastolic dysfunction (restrictive). Right Ventricle: The right ventricular size is mildly enlarged. Mildly increased right ventricular wall thickness. Right ventricular systolic function is low normal. There is mildly elevated pulmonary artery systolic pressure. The tricuspid regurgitant velocity is 3.18 m/s, and with an assumed right atrial pressure of 3 mmHg, the estimated right ventricular systolic pressure is 79.3 mmHg. Left Atrium: Left atrial size was severely dilated. Right Atrium: Right atrial size was moderately dilated. Pericardium: A moderately sized pericardial effusion is present. The pericardial effusion is posterior to the left ventricle. Thickening/calcification of pericardium present. Mitral Valve: The mitral valve is abnormal. There is mild thickening of the mitral valve leaflet(s). There is mild calcification of the mitral valve leaflet(s). Moderate mitral annular calcification. Moderate mitral valve regurgitation. Tricuspid Valve: The tricuspid valve is grossly normal. Tricuspid valve regurgitation is mild to moderate. Aortic Valve: The aortic valve is tricuspid. There is mild to moderate aortic valve annular calcification. Aortic valve regurgitation is not visualized. Pulmonic Valve: The pulmonic valve was grossly normal. Pulmonic valve regurgitation is trivial. Aorta: The aortic root is normal in size and structure. Venous: The inferior vena cava is normal in size with greater than 50% respiratory variability, suggesting right atrial pressure of 3 mmHg. IAS/Shunts: No atrial level shunt detected by color flow Doppler.  LEFT VENTRICLE PLAX 2D LVIDd:         4.10 cm  Diastology LVIDs:          2.60 cm  LV e' medial:    4.46 cm/s LV PW:         1.40 cm  LV E/e' medial:  28.7 LV IVS:        1.70 cm  LV e' lateral:   6.31 cm/s LVOT diam:     1.90 cm  LV E/e' lateral: 20.3 LV SV:  105 LV SV Index:   58 LVOT Area:     2.84 cm  RIGHT VENTRICLE RV S prime:     10.90 cm/s TAPSE (M-mode): 2.0 cm LEFT ATRIUM              Index        RIGHT ATRIUM           Index LA diam:        4.40 cm  2.44 cm/m   RA Area:     28.90 cm LA Vol (A2C):   202.0 ml 111.85 ml/m RA Volume:   90.50 ml  50.11 ml/m LA Vol (A4C):   121.0 ml 67.00 ml/m LA Biplane Vol: 158.0 ml 87.48 ml/m  AORTIC VALVE LVOT Vmax:   158.50 cm/s LVOT Vmean:  98.750 cm/s LVOT VTI:    0.369 m  AORTA Ao Root diam: 2.90 cm MITRAL VALVE                 TRICUSPID VALVE MV Area (PHT): 3.85 cm      TR Peak grad:   40.4 mmHg MV Decel Time: 197 msec      TR Vmax:        318.00 cm/s MR Peak grad:    79.6 mmHg MR Mean grad:    53.0 mmHg   SHUNTS MR Vmax:         446.00 cm/s Systemic VTI:  0.37 m MR Vmean:        345.0 cm/s  Systemic Diam: 1.90 cm MR PISA:         1.01 cm MR PISA Eff ROA: 9 mm MR PISA Radius:  0.40 cm MV E velocity: 128.00 cm/s MV A velocity: 50.10 cm/s MV E/A ratio:  2.55 [name] MD Electronically signed by [name] MD Signature Date/Time: 02/28/2021/11:49:37 AM    Final      Discharge Exam: Vitals:   03/09/21 0441 03/09/21 0727  BP: 124/87   Pulse: 70   Resp: 16   Temp: 98 F (36.7 C)   SpO2: 92% 91%   Vitals:   03/08/21 2234 03/08/21 2250 03/09/21 0441 03/09/21 0727  BP: 113/83  124/87   Pulse: 88 73 70   Resp: 20 18 16    Temp:   98 F (36.7 C)   TempSrc:   Oral   SpO2: 99% 99% 92% 91%  Weight:   77.7 kg   Height:        General: Pt is alert, awake, not in acute distress Cardiovascular: RRR, S1/S2 +, no rubs, no gallops Respiratory: CTA bilaterally, no wheezing, no rhonchi Abdominal: Soft, NT, ND, bowel sounds + Extremities: no edema, no cyanosis    The results of significant  diagnostics from this hospitalization (including imaging, microbiology, ancillary and laboratory) are listed below for reference.     Microbiology: Recent Results (from the past 240 hour(s))  Resp Panel by RT-PCR (Flu A&B, Covid) Nasopharyngeal Swab     Status: None   Collection Time: 02/27/21  8:28 PM   Specimen: Nasopharyngeal Swab; Nasopharyngeal(NP) swabs in vial transport medium  Result Value Ref Range Status   SARS Coronavirus 2 by RT PCR NEGATIVE NEGATIVE Final    Comment: (NOTE) SARS-CoV-2 target nucleic acids are NOT DETECTED.  The SARS-CoV-2 RNA is generally detectable in upper respiratory specimens during the acute phase of infection. The lowest concentration of SARS-CoV-2 viral copies this assay can detect is 138 copies/mL. A negative result does not preclude SARS-Cov-2 infection and should  not be used as the sole basis for treatment or other patient management decisions. A negative result may occur with  improper specimen collection/handling, submission of specimen other than nasopharyngeal swab, presence of viral mutation(s) within the areas targeted by this assay, and inadequate number of viral copies(<138 copies/mL). A negative result must be combined with clinical observations, patient history, and epidemiological information. The expected result is Negative.  Fact Sheet for Patients:  EntrepreneurPulse.com.au  Fact Sheet for Healthcare Providers:  IncredibleEmployment.be  This test is no t yet approved or cleared by the [place] FDA and  has been authorized for detection and/or diagnosis of SARS-CoV-2 by FDA under an Emergency Use Authorization (EUA). This EUA will remain  in effect (meaning this test can be used) for the duration of the COVID-19 declaration under Section 564(b)(1) of the Act, 21 U.S.C.section 360bbb-3(b)(1), unless the authorization is terminated  or revoked sooner.       Influenza A by PCR NEGATIVE  NEGATIVE Final   Influenza B by PCR NEGATIVE NEGATIVE Final    Comment: (NOTE) The Xpert Xpress SARS-CoV-2/FLU/RSV plus assay is intended as an aid in the diagnosis of influenza from Nasopharyngeal swab specimens and should not be used as a sole basis for treatment. Nasal washings and aspirates are unacceptable for Xpert Xpress SARS-CoV-2/FLU/RSV testing.  Fact Sheet for Patients: EntrepreneurPulse.com.au  Fact Sheet for Healthcare Providers: IncredibleEmployment.be  This test is not yet approved or cleared by the [place] FDA and has been authorized for detection and/or diagnosis of SARS-CoV-2 by FDA under an Emergency Use Authorization (EUA). This EUA will remain in effect (meaning this test can be used) for the duration of the COVID-19 declaration under Section 564(b)(1) of the Act, 21 U.S.C. section 360bbb-3(b)(1), unless the authorization is terminated or revoked.  Performed at [facility], [address], [place], [place] [address]   Blood culture (routine x 2)     Status: None   Collection Time: 02/28/21  1:25 AM   Specimen: Right Antecubital; Blood  Result Value Ref Range Status   Specimen Description RIGHT ANTECUBITAL  Final   Special Requests   Final    BOTTLES DRAWN AEROBIC AND ANAEROBIC Blood Culture adequate volume   Culture   Final    NO GROWTH 5 DAYS Performed at [facility], [address], [place], [place] [address]    Report Status 03/05/2021 FINAL  Final  Blood culture (routine x 2)     Status: None   Collection Time: 02/28/21  4:22 AM   Specimen: BLOOD LEFT HAND  Result Value Ref Range Status   Specimen Description   Final    BLOOD LEFT HAND BOTTLES DRAWN AEROBIC AND ANAEROBIC   Special Requests Blood Culture adequate volume  Final   Culture   Final    NO GROWTH 5 DAYS Performed at [facility], [address], [place], [place] [address]    Report Status 03/05/2021 FINAL  Final  Culture, Respiratory w Gram  Stain     Status: None   Collection Time: 02/28/21  2:34 PM   Specimen: SPU  Result Value Ref Range Status   Specimen Description   Final    SPUTUM Performed at [facility], [address] [place], [place] [address]    Special Requests   Final    NONE Performed at [facility], [address] [place],  [address]    Gram Stain RARE WBC PRESENT, PREDOMINANTLY PMN FEW YEAST   Final   Culture  Final    ABUNDANT Normal respiratory flora-no Staph aureus or Pseudomonas seen Performed at [facility] [address], [place], [place] [address]    Report Status 03/03/2021 FINAL  Final  MRSA Next Gen by PCR, Nasal     Status: None   Collection Time: 03/07/21 11:50 AM   Specimen: Nasal Mucosa; Nasal Swab  Result Value Ref Range Status   MRSA by PCR Next Gen NOT DETECTED NOT DETECTED Final    Comment: (NOTE) The GeneXpert MRSA Assay (FDA approved for NASAL specimens only), is one component of a comprehensive MRSA colonization surveillance program. It is not intended to diagnose MRSA infection nor to guide or monitor treatment for MRSA infections. Test performance is not FDA approved in patients less than 67 years old. Performed at [facility], [address], [place], [place] [address]      Labs: BNP (last 3 results) Recent Labs    02/27/21 2009  BNP 098.1*   Basic Metabolic Panel: Recent Labs  Lab 03/04/21 0517 03/05/21 0508 03/06/21 0518 03/07/21 0447 03/09/21 0538  NA 135 134* 137 135 133*  K 4.3 4.0 3.9 3.4* 4.9  CL 91* 89* 89* 87* 97*  CO2 34* 35* 38* 37* 29  GLUCOSE 148* 145* 142* 147* 91  BUN 24* 24* 24* 27* 27*  CREATININE 0.72 0.73 0.72 0.69 0.67  CALCIUM 8.8* 8.6* 8.9 8.6* 8.3*  MG 2.1 2.0 2.1 2.1 2.1   Liver Function Tests: No results for input(s): AST, ALT, ALKPHOS, BILITOT, PROT, ALBUMIN in the last 168 hours. No results for input(s): LIPASE, AMYLASE in the last 168 hours. No results for input(s): AMMONIA in the last 168  hours. CBC: Recent Labs  Lab 03/03/21 0448 03/04/21 0517 03/05/21 0508 03/06/21 0518 03/09/21 0538  WBC 11.2* 13.6* 12.4* 13.1* 15.6*  HGB 9.2* 10.4* 10.2* 11.4* 11.4*  HCT 30.8* 34.1* 33.3* 36.7 37.4  MCV 92.2 92.2 88.6 90.0 88.4  PLT 483* 533* 494* 455* 351   Cardiac Enzymes: No results for input(s): CKTOTAL, CKMB, CKMBINDEX, TROPONINI in the last 168 hours. BNP: Invalid input(s): POCBNP CBG: No results for input(s): GLUCAP in the last 168 hours. D-Dimer No results for input(s): DDIMER in the last 72 hours. Hgb A1c No results for input(s): HGBA1C in the last 72 hours. Lipid Profile No results for input(s): CHOL, HDL, LDLCALC, TRIG, CHOLHDL, LDLDIRECT in the last 72 hours. Thyroid function studies No results for input(s): TSH, T4TOTAL, T3FREE, THYROIDAB in the last 72 hours.  Invalid input(s): FREET3 Anemia work up No results for input(s): VITAMINB12, FOLATE, FERRITIN, TIBC, IRON, RETICCTPCT in the last 72 hours. Urinalysis    Component Value Date/Time   COLORURINE STRAW (A) 08/10/2015 1930   APPEARANCEUR CLEAR 08/10/2015 1930   LABSPEC 1.015 08/10/2015 1930   PHURINE 5.5 08/10/2015 1930   GLUCOSEU NEGATIVE 08/10/2015 1930   HGBUR NEGATIVE 08/10/2015 1930   BILIRUBINUR NEGATIVE 08/10/2015 1930   KETONESUR NEGATIVE 08/10/2015 1930   PROTEINUR NEGATIVE 08/10/2015 1930   UROBILINOGEN 0.2 05/13/2009 1307   NITRITE NEGATIVE 08/10/2015 1930   LEUKOCYTESUR TRACE (A) 08/10/2015 1930   Sepsis Labs Invalid input(s): PROCALCITONIN,  WBC,  LACTICIDVEN Microbiology Recent Results (from the past 240 hour(s))  Resp Panel by RT-PCR (Flu A&B, Covid) Nasopharyngeal Swab     Status: None   Collection Time: 02/27/21  8:28 PM   Specimen: Nasopharyngeal Swab; Nasopharyngeal(NP) swabs in vial transport medium  Result Value Ref Range Status   SARS Coronavirus 2 by RT PCR NEGATIVE  NEGATIVE Final    Comment: (NOTE) SARS-CoV-2 target nucleic acids are NOT DETECTED.  The SARS-CoV-2  RNA is generally detectable in upper respiratory specimens during the acute phase of infection. The lowest concentration of SARS-CoV-2 viral copies this assay can detect is 138 copies/mL. A negative result does not preclude SARS-Cov-2 infection and should not be used as the sole basis for treatment or other patient management decisions. A negative result may occur with  improper specimen collection/handling, submission of specimen other than nasopharyngeal swab, presence of viral mutation(s) within the areas targeted by this assay, and inadequate number of viral copies(<138 copies/mL). A negative result must be combined with clinical observations, patient history, and epidemiological information. The expected result is Negative.  Fact Sheet for Patients:  EntrepreneurPulse.com.au  Fact Sheet for Healthcare Providers:  IncredibleEmployment.be  This test is no t yet approved or cleared by the [place] FDA and  has been authorized for detection and/or diagnosis of SARS-CoV-2 by FDA under an Emergency Use Authorization (EUA). This EUA will remain  in effect (meaning this test can be used) for the duration of the COVID-19 declaration under Section 564(b)(1) of the Act, 21 U.S.C.section 360bbb-3(b)(1), unless the authorization is terminated  or revoked sooner.       Influenza A by PCR NEGATIVE NEGATIVE Final   Influenza B by PCR NEGATIVE NEGATIVE Final    Comment: (NOTE) The Xpert Xpress SARS-CoV-2/FLU/RSV plus assay is intended as an aid in the diagnosis of influenza from Nasopharyngeal swab specimens and should not be used as a sole basis for treatment. Nasal washings and aspirates are unacceptable for Xpert Xpress SARS-CoV-2/FLU/RSV testing.  Fact Sheet for Patients: EntrepreneurPulse.com.au  Fact Sheet for Healthcare Providers: IncredibleEmployment.be  This test is not yet approved or cleared by the  [place] FDA and has been authorized for detection and/or diagnosis of SARS-CoV-2 by FDA under an Emergency Use Authorization (EUA). This EUA will remain in effect (meaning this test can be used) for the duration of the COVID-19 declaration under Section 564(b)(1) of the Act, 21 U.S.C. section 360bbb-3(b)(1), unless the authorization is terminated or revoked.  Performed at [facility], [address] [place], [place] [address]   Blood culture (routine x 2)     Status: None   Collection Time: 02/28/21  1:25 AM   Specimen: Right Antecubital; Blood  Result Value Ref Range Status   Specimen Description RIGHT ANTECUBITAL  Final   Special Requests   Final    BOTTLES DRAWN AEROBIC AND ANAEROBIC Blood Culture adequate volume   Culture   Final    NO GROWTH 5 DAYS Performed at [facility], [address] [place], [place] [address]    Report Status 03/05/2021 FINAL  Final  Blood culture (routine x 2)     Status: None   Collection Time: 02/28/21  4:22 AM   Specimen: BLOOD LEFT HAND  Result Value Ref Range Status   Specimen Description   Final    BLOOD LEFT HAND BOTTLES DRAWN AEROBIC AND ANAEROBIC   Special Requests Blood Culture adequate volume  Final   Culture   Final    NO GROWTH 5 DAYS Performed at [facility], [address] [place], [place] [address]    Report Status 03/05/2021 FINAL  Final  Culture, Respiratory w Gram Stain     Status: None   Collection Time: 02/28/21  2:34 PM   Specimen: SPU  Result Value Ref Range Status   Specimen Description   Final  SPUTUM Performed at [facility], [address], [place], [place] [address]    Special Requests   Final    NONE Performed at [facility], [address] [place], [place] [address]    Gram Stain RARE WBC PRESENT, PREDOMINANTLY PMN FEW YEAST   Final   Culture   Final    ABUNDANT Normal respiratory flora-no Staph aureus or Pseudomonas seen Performed at [facility], [address], [place], [place]  [address]    Report Status 03/03/2021 FINAL  Final  MRSA Next Gen by PCR, Nasal     Status: None   Collection Time: 03/07/21 11:50 AM   Specimen: Nasal Mucosa; Nasal Swab  Result Value Ref Range Status   MRSA by PCR Next Gen NOT DETECTED NOT DETECTED Final    Comment: (NOTE) The GeneXpert MRSA Assay (FDA approved for NASAL specimens only), is one component of a comprehensive MRSA colonization surveillance program. It is not intended to diagnose MRSA infection nor to guide or monitor treatment for MRSA infections. Test performance is not FDA approved in patients less than 62 years old. Performed at [facility], [address], [place], [place] [address]      Time coordinating discharge: 35 minutes  SIGNED:   Rodena Goldmann, DO [facility] 03/09/2021, 9:11 AM  If 7PM-7AM, please contact night-coverage [email or web address]

## 2021-03-22 DIAGNOSIS — J449 Chronic obstructive pulmonary disease, unspecified: Secondary | ICD-10-CM | POA: Diagnosis not present

## 2021-03-30 DIAGNOSIS — M47816 Spondylosis without myelopathy or radiculopathy, lumbar region: Secondary | ICD-10-CM | POA: Diagnosis not present

## 2021-03-30 DIAGNOSIS — G894 Chronic pain syndrome: Secondary | ICD-10-CM | POA: Diagnosis not present

## 2021-03-30 DIAGNOSIS — I4891 Unspecified atrial fibrillation: Secondary | ICD-10-CM | POA: Diagnosis not present

## 2021-03-30 DIAGNOSIS — J449 Chronic obstructive pulmonary disease, unspecified: Secondary | ICD-10-CM | POA: Diagnosis not present

## 2021-03-30 DIAGNOSIS — R0902 Hypoxemia: Secondary | ICD-10-CM | POA: Diagnosis not present

## 2021-04-01 ENCOUNTER — Ambulatory Visit (INDEPENDENT_AMBULATORY_CARE_PROVIDER_SITE_OTHER): Payer: Medicare Other

## 2021-04-01 ENCOUNTER — Other Ambulatory Visit: Payer: Self-pay

## 2021-04-01 ENCOUNTER — Encounter: Payer: Self-pay | Admitting: Internal Medicine

## 2021-04-01 ENCOUNTER — Ambulatory Visit (INDEPENDENT_AMBULATORY_CARE_PROVIDER_SITE_OTHER): Payer: Medicare Other | Admitting: Internal Medicine

## 2021-04-01 DIAGNOSIS — J449 Chronic obstructive pulmonary disease, unspecified: Secondary | ICD-10-CM | POA: Diagnosis not present

## 2021-04-01 DIAGNOSIS — R0609 Other forms of dyspnea: Secondary | ICD-10-CM | POA: Diagnosis not present

## 2021-04-01 DIAGNOSIS — R059 Cough, unspecified: Secondary | ICD-10-CM | POA: Diagnosis not present

## 2021-04-01 DIAGNOSIS — R0600 Dyspnea, unspecified: Secondary | ICD-10-CM | POA: Diagnosis not present

## 2021-04-01 DIAGNOSIS — I517 Cardiomegaly: Secondary | ICD-10-CM | POA: Diagnosis not present

## 2021-04-01 LAB — CBC WITH DIFFERENTIAL/PLATELET
Basophils Absolute: 0.1 10*3/uL (ref 0.0–0.1)
Basophils Relative: 0.7 % (ref 0.0–3.0)
Eosinophils Absolute: 0.2 10*3/uL (ref 0.0–0.7)
Eosinophils Relative: 2.5 % (ref 0.0–5.0)
HCT: 32.1 % — ABNORMAL LOW (ref 36.0–46.0)
Hemoglobin: 9.9 g/dL — ABNORMAL LOW (ref 12.0–15.0)
Lymphocytes Relative: 21.2 % (ref 12.0–46.0)
Lymphs Abs: 2 10*3/uL (ref 0.7–4.0)
MCHC: 30.8 g/dL (ref 30.0–36.0)
MCV: 86.7 fl (ref 78.0–100.0)
Monocytes Absolute: 1.3 10*3/uL — ABNORMAL HIGH (ref 0.1–1.0)
Monocytes Relative: 13.8 % — ABNORMAL HIGH (ref 3.0–12.0)
Neutro Abs: 5.7 10*3/uL (ref 1.4–7.7)
Neutrophils Relative %: 61.8 % (ref 43.0–77.0)
Platelets: 438 10*3/uL — ABNORMAL HIGH (ref 150.0–400.0)
RBC: 3.71 Mil/uL — ABNORMAL LOW (ref 3.87–5.11)
RDW: 19.2 % — ABNORMAL HIGH (ref 11.5–15.5)
WBC: 9.2 10*3/uL (ref 4.0–10.5)

## 2021-04-01 LAB — BASIC METABOLIC PANEL
BUN: 16 mg/dL (ref 6–23)
CO2: 26 mEq/L (ref 19–32)
Calcium: 9.3 mg/dL (ref 8.4–10.5)
Chloride: 100 mEq/L (ref 96–112)
Creatinine, Ser: 0.96 mg/dL (ref 0.40–1.20)
GFR: 66.15 mL/min (ref >60.00)
Glucose, Bld: 121 mg/dL — ABNORMAL HIGH (ref 70–99)
Potassium: 4.5 mEq/L (ref 3.5–5.1)
Sodium: 137 mEq/L (ref 135–145)

## 2021-04-01 LAB — D-DIMER, QUANTITATIVE: D-Dimer, Quant: 0.48 mcg/mL FEU (ref <0.50)

## 2021-04-01 LAB — SEDIMENTATION RATE: Sed Rate: 45 mm/hr — ABNORMAL HIGH (ref 0–30)

## 2021-04-01 LAB — BRAIN NATRIURETIC PEPTIDE: Pro B Natriuretic peptide (BNP): 710 pg/mL — ABNORMAL HIGH (ref 0.0–100.0)

## 2021-04-01 LAB — TSH: TSH: 2.66 u[IU]/mL (ref 0.35–5.50)

## 2021-04-01 MED ORDER — BREZTRI AEROSPHERE 160-9-4.8 MCG/ACT IN AERO: 2.0000 | INHALATION_SPRAY | Freq: Two times a day (BID) | RESPIRATORY_TRACT | 0 refills | Status: DC

## 2021-04-01 MED ORDER — BREZTRI AEROSPHERE 160-9-4.8 MCG/ACT IN AERO: INHALATION_SPRAY | RESPIRATORY_TRACT | 11 refills | Status: DC

## 2021-04-01 NOTE — Telephone Encounter (Signed)
Sorry we didn't get a chance to discuss this at the office >>  I reviewed the hospital records and not clear at this point which test is best (they are not all the same nor paid for by insurance)  since  I don't do sleep medicine  happy to send her to one of ours so the right sleep study gets ordered and paid for by her insurance

## 2021-04-01 NOTE — Patient Instructions (Addendum)
Plan A = Automatic = Always=    Breztri Take 2 puffs first thing in am and then another 2 puffs about 12 hours later.    Work on inhaler technique:  relax and gently blow all the way out then take a nice smooth full deep breath back in, triggering the inhaler at same time you start breathing in.  Hold for up to 5 seconds if you can. Blow out thru nose. Rinse and gargle with water when done.  If mouth or throat bother you at all,  try brushing teeth/gums/tongue with arm and hammer toothpaste/ make a slurry and gargle and spit out.       Plan B = Backup (to supplement plan A, not to replace it) Only use your albuterol inhaler as a rescue medication to be used if you can't catch your breath by resting or doing a relaxed purse lip breathing pattern.  - The less you use it, the better it will work when you need it. - Ok to use the inhaler up to 2 puffs  every 4 hours if you must but call for appointment if use goes up over your usual need - Don't leave home without it !!  (think of it like the spare tire for your car)   Plan C = Crisis (instead of Plan B but only if Plan B stops working) - only use your albuterol nebulizer if you first try Plan B and it fails to help > ok to use the nebulizer up to every 4 hours but if start needing it regularly call for immediate appointment  Please remember to go to the lab and x-ray department  for your tests - we will call you with the results when they are available.       Please schedule a follow up office visit in 6 weeks with PFTs on return

## 2021-04-01 NOTE — Progress Notes (Signed)
Kathleen Wright, female    DOB: 1966-01-10,    MRN: [id number]   Brief patient profile:  72 yowf quit smoking 12/2020   referred to pulmonary clinic 04/01/2021 by Dr Manuella Ghazi ([facility])   s/p admit:  Admit date: 02/27/2021   Discharge date: 03/09/2021   Admitted From:Home   Disposition:  SNF   Recommendations for Outpatient Follow-up:  Follow up with PCP in 1-2 weeks Referral to pulmonology outpatient follow-up for COPD as well as set up of sleep study Referral to cardiology locally for follow-up regarding atrial fibrillation/heart failure Continue on prednisone for 2 more days as well as nystatin Lasix prescribed as needed for edema Continue other home medications as prior   Home Health: None   Equipment/Devices: None   Discharge Condition:Stable   CODE STATUS: Full   Diet recommendation: Heart Healthy   Brief/Interim Summary: Kathleen Wright  is a 55 y.o. female, with history of asthma, COPD, hypertrophic cardiomyopathy, paroxysmal atrial fibrillation, and more presents the ED with a chief complaint of dyspnea.  Her symptoms have been ongoing for approximately 1 week and were worsening.  She was admitted for acute hypoxemic respiratory failure that was multifactorial in the setting of acute on chronic diastolic congestive heart failure exacerbation as well as COPD exacerbation related to bronchiectasis/pneumonia.  She was treated with IV steroids as well as breathing treatments along with Rocephin and Zithromax for the pneumonia.  She was also started on IV diuresis with Lasix and needed several days of hospitalization prior to reaching euvolemia.  She did have an episode of atrial fibrillation with RVR on 9/24 and was transferred to stepdown unit on 9/25 for brief initiation of Cardizem drip.  She stabilized shortly after this and was seen by physical therapy with recommendations for home health PT, but patient felt more comfortable with SNF on discharge and therefore she will  be discharged to [facility].  She was noted to have some delirium during the hospitalization that was related to her steroids which improved after tapering.  She also developed some subsequent oral thrush which was treated with nystatin swish and swallow.    Discharge Diagnoses:  Active Problems:   History of atrial fibrillation   Elevated troponin   COPD with acute exacerbation (HCC)   Protein-calorie malnutrition, mild (HCC)   Respiratory failure with hypoxia ([place])         History of Present Illness  04/01/2021  Pulmonary/ 1st office eval/Kathleen Wright  Chief Complaint  Patient presents with   Consult    Sob has worsened  Dyspnea:  at best can dosev [place]isles at [employer] [facility] parking downhill since 03/28/21 assoc nasal congestion Cough: white / minimally congested better while sleeping Sleep: bed is flat/ one pillow SABA use:not used neb  0 2 none  Off prednisone a couple of weeks had  improved then gradually worse sob/cough again since then  No obvious day to day or daytime variability or assoc   purulent sputum or mucus plugs or hemoptysis or cp or chest tightness, subjective wheeze or overt sinus or hb symptoms.   Sleeping as above  without nocturnal  or early am exacerbation  of respiratory  c/o's or need for noct saba. Also denies any obvious fluctuation of symptoms with weather or environmental changes or other aggravating or alleviating factors except as outlined above   No unusual exposure hx or h/o childhood pna/ asthma or knowledge of premature birth.  Current Allergies, Complete Past Medical History, Past Surgical History,  Family History, and Social History were reviewed in [employer] record.  ROS  The following are not active complaints unless bolded Hoarseness, sore throat, dysphagia, dental problems, itching, sneezing,  nasal congestion or discharge of excess mucus or purulent secretions, ear ache,   fever, chills, sweats, unintended wt loss or wt gain,  classically pleuritic or exertional cp,  orthopnea pnd or arm/hand swelling  or leg swelling, presyncope, palpitations, abdominal pain, anorexia, nausea, vomiting, diarrhea  or change in bowel habits or change in bladder habits, change in stools or change in urine, dysuria, hematuria,  rash, arthralgias, visual complaints, headache, numbness, weakness or ataxia or problems with walking or coordination,  change in mood or  memory.           Past Medical History:  Diagnosis Date   Asthma    Cervical cancer ([place])    a. treated with partial removal of cervix   COPD (chronic obstructive pulmonary disease) ([place])    Hypertrophic cardiomyopathy ([place])    a. s/p myomectomy at [facility] 08/2004   Paroxysmal atrial flutter (HCC)    a. post-op after myomectomy in 2006. s/p TEE/DCCV.   Transient atrial fibrillation ([place]) 2006   a. after myomectomy at [facility] in 08/2004 - was on amiodarone but developed elevated LFTs felt possibly secondary to amiodarone     Outpatient Medications Prior to Visit  Medication Sig Dispense Refill   albuterol (PROVENTIL) (2.5 MG/3ML) 0.083% nebulizer solution Take 2.5 mg by nebulization every 6 (six) hours as needed for wheezing or shortness of breath.     ALPRAZolam (XANAX) 0.25 MG tablet Take 1 tablet (0.25 mg total) by mouth 3 (three) times daily as needed for anxiety. 10 tablet 0   aspirin EC 81 MG tablet Take 81 mg by mouth daily.     dabigatran (PRADAXA) 150 MG CAPS capsule Take 1 capsule (150 mg total) by mouth 2 (two) times daily. 60 capsule 6   diltiazem (CARDIZEM CD) 180 MG 24 hr capsule Take 180 mg by mouth daily.     diltiazem (CARDIZEM) 30 MG tablet TAKE (1) TABLET BY MOUTH EVERY FOUR HOURS AS NEEDED FOR AFIB HEART RATE OVER 100. (Patient taking differently: Take 30 mg by mouth every 4 (four) hours as needed (AFIB HEART RATE OVER 100).) 45 tablet 3   furosemide (LASIX) 20 MG tablet Take 1 tablet (20 mg total) by mouth daily as needed for edema. 30 tablet 0   ipratropium  (ATROVENT) 0.02 % nebulizer solution Take 0.25 mg by nebulization every 4 (four) hours as needed for wheezing or shortness of breath.     metoprolol tartrate (LOPRESSOR) 25 MG tablet Take 1 tablet (25 mg total) by mouth 2 (two) times daily. 60 tablet 2   omeprazole (PRILOSEC) 40 MG capsule Take 40 mg by mouth daily.     oxyCODONE (OXY IR/ROXICODONE) 5 MG immediate release tablet Take 1 tablet (5 mg total) by mouth every 6 (six) hours as needed for moderate pain, breakthrough pain or severe pain. 10 tablet 0   potassium chloride (KLOR-CON) 10 MEQ tablet Take 10 mEq by mouth daily as needed (when taking lasix).     PROAIR HFA 108 (90 Base) MCG/ACT inhaler Inhale 2 puffs into the lungs every 6 (six) hours as needed for wheezing or shortness of breath.             triamcinolone (KENALOG) 0.1 % Apply 1 application topically daily as needed (Blister). (Patient not taking: Reported on 04/01/2021)  Objective:     BP 110/68 (BP Location: Left Arm, Cuff Size: Normal)   Pulse 94   Temp 98.2 F (36.8 C) (Oral)   Ht 5\' 1"  (1.549 m)   Wt 181 lb (82.1 kg)   SpO2 95% Comment: RA  BMI 34.20 kg/m   SpO2: 95 % (RA)  Amb wf minimally congested cough   HEENT : pt wearing mask not removed for exam due to covid - 19 concerns.   NECK :  without JVD/Nodes/TM/ nl carotid upstrokes bilaterally   LUNGS: no acc muscle use,  Min barrel  contour chest wall with bilateral  slightly decreased bs s audible wheeze and  without cough on insp or exp maneuvers and min  Hyperresonant  to  percussion bilaterally     CV:  RRR  no s3 or murmur or increase in P2, and no edema   ABD:  soft and nontender with pos end  insp Hoover's  in the supine position. No bruits or organomegaly appreciated, bowel sounds nl  MS:   Nl gait/  ext warm without deformities, calf tenderness, cyanosis or clubbing No obvious joint restrictions   SKIN: warm and dry without lesions    NEURO:  alert, approp, nl sensorium with  no  motor or cerebellar deficits apparent.       Labs ordered/ reviewed:      Chemistry      Component Value Date/Time   NA 137 04/01/2021 1003   NA 141 10/05/2016 1702   K 4.5 04/01/2021 1003   CL 100 04/01/2021 1003   CO2 26 04/01/2021 1003   BUN 16 04/01/2021 1003   BUN 22 10/05/2016 1702   CREATININE 0.96 04/01/2021 1003   CREATININE 1.17 (H) 03/22/2016 1434      Component Value Date/Time   CALCIUM 9.3 04/01/2021 1003   ALKPHOS 108 02/28/2021 0445   AST 16 02/28/2021 0445   ALT 16 02/28/2021 0445   BILITOT 0.9 02/28/2021 0445        Lab Results  Component Value Date   WBC 9.2 04/01/2021   HGB 9.9 (L) 04/01/2021   HCT 32.1 (L) 04/01/2021   MCV 86.7 04/01/2021   PLT 438.0 (H) 04/01/2021     Lab Results  Component Value Date   DDIMER 0.48 04/01/2021      Lab Results  Component Value Date   TSH 2.66 04/01/2021     Lab Results  Component Value Date   PROBNP 710.0 (H) 04/01/2021       Lab Results  Component Value Date   ESRSEDRATE 45 (H) 04/01/2021         Allergy profile 04/01/2021 >  Eos 0. /  IgE    I personally reviewed images and agree with radiology impression as follows:   Chest CTa 02/27/21  1. No evidence of significant pulmonary embolus. 2. Cardiac enlargement with evidence of right heart failure. 3. Bronchial wall thickening with peribronchial nodular airspace infiltrates likely representing multifocal pneumonia or airways disease.    CXR PA and Lateral:   04/01/2021 :    I personally reviewed images  / impression as follows:    CM / copd no acute changes  Assessment   COPD GOLD ?  Quit smoking 12/2020 - Labs ordered 04/01/2021  :  allergy profile   alpha one AT phenotype - 04/01/2021  After extensive coaching inhaler device,  effectiveness =    80% try breztri   Clinically severe copd and likely  Group D in  terms of symptom/risk and laba/lama/ICS  therefore appropriate rx at this point >>>  breztri 2bid or insurance equivalent plus  approp saba:  Re SABA :  I spent extra time with pt today reviewing appropriate use of albuterol for prn use on exertion with the following points: 1) saba is for relief of sob that does not improve by walking a slower pace or resting but rather if the pt does not improve after trying this first. 2) If the pt is convinced, as many are, that saba helps recover from activity faster then it's easy to tell if this is the case by re-challenging : ie stop, take the inhaler, then p 5 minutes try the exact same activity (intensity of workload) that just caused the symptoms and see if they are substantially diminished or not after saba 3) if there is an activity that reproducibly causes the symptoms, try the saba 15 min before the activity on alternate days   If in fact the saba really does help, then fine to continue to use it prn but advised may need to look closer at the maintenance regimen being used to achieve better control of airways disease with exertion.    >>> f/u in 6 weeks with pfts/ [place] office preferably     DOE (dyspnea on exertion) Echo 02/28/21  1. Left ventricular ejection fraction, by estimation, is 65 to 70%. The  left ventricle has normal function. The left ventricle has no regional  wall motion abnormalities. There is moderate asymmetric left ventricular  hypertrophy of the septal segment.  Left ventricular diastolic parameters are consistent with Grade III  diastolic dysfunction (restrictive).   2. Right ventricular systolic function is low normal. The right  ventricular size is mildly enlarged. Mildly increased right ventricular  wall thickness. There is mildly elevated pulmonary artery systolic  pressure. The estimated right ventricular systolic pressure is 69.4 mmHg.   3. Left atrial size was severely dilated.   4. Right atrial size was moderately dilated.   5. Moderate pericardial effusion. The pericardial effusion is posterior  to the left ventricle.   6. The  mitral valve is abnormal. Moderate mitral valve regurgitation.  Moderate mitral annular calcification.   7. Tricuspid valve regurgitation is mild to moderate.   8. The aortic valve is tricuspid. Aortic valve regurgitation is not  visualized.   9. The inferior vena cava is normal in size with greater than 50%  respiratory variability, suggesting right atrial pressure of 3 mmHg  - 04/01/2021   Walked on RA 3 x  3  lap(s) =  approx 450 @ moderate to fast pace, stopped due to end of study though c/o sob p 1st lap  with lowest 02 sats 93%    Clearly multifactorial with L > R atrial enlargement related to LVH with MR so will need to keep bp down and fluid status under control but no evidence of classic cor pulmonale at this point >>>  Cards f/u planned  Medical decision making was a high level of complexity in this case because of   chronic condition/diagnoses with recent  severe exacerbation progression side effects of treatment  requiring extra time for  H and P, chart review, counseling, reviewing use of neb/hfa  and generating customized AVS unique to this office visit and charting.   Each maintenance medication was reviewed in detail including emphasizing most importantly the difference between maintenance and prns and under what circumstances the prns are to be triggered using an action plan  format where appropriate. Please see avs for details which were reviewed in writing by both me and my nurse and patient given a written copy highlighted where appropriate with yellow highlighter for the patient's continued care at home along with an updated version of their medications.  Patient was asked to maintain medication reconciliation by comparing this list to the actual medications being used at home and to contact this office right away if there is a conflict or discrepancy.                            Christinia Gully, MD 04/01/2021

## 2021-04-01 NOTE — Telephone Encounter (Signed)
MW please advise on the sleep study.  Thanks

## 2021-04-02 ENCOUNTER — Encounter: Payer: Self-pay | Admitting: Internal Medicine

## 2021-04-02 LAB — IGE: IgE (Immunoglobulin E), Serum: 11 kU/L (ref <=114)

## 2021-04-02 NOTE — Telephone Encounter (Signed)
MW please advise on appt with Sleep doctor.  thanks

## 2021-04-02 NOTE — Telephone Encounter (Signed)
1st available sleep medicine our group

## 2021-04-02 NOTE — Assessment & Plan Note (Addendum)
Quit smoking 12/2020 - Labs ordered 04/01/2021  :  allergy profile   alpha one AT phenotype - 04/01/2021  After extensive coaching inhaler device,  effectiveness =    80% try breztri   Clinically severe copd and likely  Group D in terms of symptom/risk and laba/lama/ICS  therefore appropriate rx at this point >>>  breztri 2bid or insurance equivalent plus approp saba:  Re SABA :  I spent extra time with pt today reviewing appropriate use of albuterol for prn use on exertion with the following points: 1) saba is for relief of sob that does not improve by walking a slower pace or resting but rather if the pt does not improve after trying this first. 2) If the pt is convinced, as many are, that saba helps recover from activity faster then it's easy to tell if this is the case by re-challenging : ie stop, take the inhaler, then p 5 minutes try the exact same activity (intensity of workload) that just caused the symptoms and see if they are substantially diminished or not after saba 3) if there is an activity that reproducibly causes the symptoms, try the saba 15 min before the activity on alternate days   If in fact the saba really does help, then fine to continue to use it prn but advised may need to look closer at the maintenance regimen being used to achieve better control of airways disease with exertion.    >>> f/u in 6 weeks with pfts/  office preferably

## 2021-04-02 NOTE — Telephone Encounter (Signed)
Patient scheduled in [facility] office with Dr. Halford Chessman for 12/22.   Has a f/u appt with Dr. Melvyn Novas the week before.  Dr. Melvyn Novas please advise is it okay to cancel the f/u appt with you?

## 2021-04-02 NOTE — Assessment & Plan Note (Addendum)
Echo 02/28/21  1. Left ventricular ejection fraction, by estimation, is 65 to 70%. The  left ventricle has normal function. The left ventricle has no regional  wall motion abnormalities. There is moderate asymmetric left ventricular  hypertrophy of the septal segment.  Left ventricular diastolic parameters are consistent with Grade III  diastolic dysfunction (restrictive).  2. Right ventricular systolic function is low normal. The right  ventricular size is mildly enlarged. Mildly increased right ventricular  wall thickness. There is mildly elevated pulmonary artery systolic  pressure. The estimated right ventricular systolic pressure is 37.1 mmHg.  3. Left atrial size was severely dilated.  4. Right atrial size was moderately dilated.  5. Moderate pericardial effusion. The pericardial effusion is posterior  to the left ventricle.  6. The mitral valve is abnormal. Moderate mitral valve regurgitation.  Moderate mitral annular calcification.  7. Tricuspid valve regurgitation is mild to moderate.  8. The aortic valve is tricuspid. Aortic valve regurgitation is not  visualized.  9. The inferior vena cava is normal in size with greater than 50%  respiratory variability, suggesting right atrial pressure of 3 mmHg  - 04/01/2021   Walked on RA 3 x  3  lap(s) =  approx 450 @ moderate to fast pace, stopped due to end of study though c/o sob p 1st lap  with lowest 02 sats 93%    Clearly multifactorial with L > R atrial enlargement related to LVH with MR so will need to keep bp down and fluid status under control but no evidence of classic cor pulmonale at this point >>>  Cards f/u planned  Medical decision making was a high level of complexity in this case because of   chronic condition/diagnoses with recent  severe exacerbation progression side effects of treatment  requiring extra time for  H and P, chart review, counseling, reviewing use of neb/hfa  and generating customized AVS unique to  this office visit and charting.   Each maintenance medication was reviewed in detail including emphasizing most importantly the difference between maintenance and prns and under what circumstances the prns are to be triggered using an action plan format where appropriate. Please see avs for details which were reviewed in writing by both me and my nurse and patient given a written copy highlighted where appropriate with yellow highlighter for the patient's continued care at home along with an updated version of their medications.  Patient was asked to maintain medication reconciliation by comparing this list to the actual medications being used at home and to contact this office right away if there is a conflict or discrepancy.

## 2021-04-03 NOTE — Telephone Encounter (Signed)
Dr Halford Chessman is doing a sleep eval and if she wants him to take over on  her lung issues as well that is fine with me - what we could do is reschedule my visit for a week or two after and if Dr Sood/ she still want me to see her she can keep the appt, otherwise just cancel it and go with one doc doing both

## 2021-04-07 ENCOUNTER — Ambulatory Visit: Payer: Medicare Other | Admitting: Cardiology

## 2021-04-07 ENCOUNTER — Other Ambulatory Visit: Payer: Self-pay

## 2021-04-07 ENCOUNTER — Encounter: Payer: Self-pay | Admitting: Cardiology

## 2021-04-07 VITALS — BP 106/58 | HR 120 | Ht 61.0 in | Wt 185.2 lb

## 2021-04-07 DIAGNOSIS — Z9889 Other specified postprocedural states: Secondary | ICD-10-CM | POA: Diagnosis not present

## 2021-04-07 DIAGNOSIS — Z5181 Encounter for therapeutic drug level monitoring: Secondary | ICD-10-CM | POA: Insufficient documentation

## 2021-04-07 DIAGNOSIS — J441 Chronic obstructive pulmonary disease with (acute) exacerbation: Secondary | ICD-10-CM

## 2021-04-07 DIAGNOSIS — I484 Atypical atrial flutter: Secondary | ICD-10-CM

## 2021-04-07 DIAGNOSIS — Z7901 Long term (current) use of anticoagulants: Secondary | ICD-10-CM | POA: Insufficient documentation

## 2021-04-07 DIAGNOSIS — I4892 Unspecified atrial flutter: Secondary | ICD-10-CM

## 2021-04-07 NOTE — H&P (View-Only) (Signed)
Cardiology Office Note:    Date:  04/07/2021   ID:  Kathleen Wright, DOB 08/10/1965, MRN [id number]  PCP:  Ginger Organ   [facility] Providers Cardiologist:  None     Referring MD: Heath Lark D, DO    History of Present Illness:    Kathleen Wright is a 55 y.o. female here for evaluation of hypertrophic cardiomyopathy post myomectomy at [facility] in 2006, paroxysmal atrial fibrillation with recent discharge from hospital on 03/09/2021.  Her symptoms were worsening dyspnea and she was admitted with hypoxic respiratory failure multifactorial in the setting of chronic diastolic heart failure as well as COPD exacerbation.  She was given IV steroids breathing treatments Rocephin and azithromycin and then IV diuresis with Lasix which required several days before she reached euvolemia.  On 03/06/2021 she did have an episode of atrial fibrillation with rapid ventricular response and was transferred to the stepdown unit and was placed on a Cardizem drip.  She stabilized shortly after this.  She was sent to [facility] skilled nursing facility on discharge.  She had some minor delirium during the hospitalization thought to be secondary to steroids.  In the past she has had some elevated liver enzymes with amiodarone.  She has been taking Pradaxa 150 mg twice a day for her atrial fibrillation as well as diltiazem 180 mg once a day.  She has metoprolol 25 mg twice a day as well.  She was sent out with furosemide 20 mg as needed for edema.  She was last seen in the atrial fibrillation clinic by Roderic Palau, NP on 09/04/2020.  Previously she had felt as though she was in atrial fibrillation and had successful cardioversion on 09/05/2020 and had remained in sinus rhythm until episode on 03/06/2021.  Today, she is back in atrial flutter/fibrillation with heart rate of 120 bpm.  Frustrated.  Past Medical History:  Diagnosis Date   Asthma    Cervical cancer ([place])    a. treated with partial removal  of cervix   COPD (chronic obstructive pulmonary disease) ([place])    Hypertrophic cardiomyopathy ([place])    a. s/p myomectomy at [facility] 08/2004   Paroxysmal atrial flutter (H[facility])    a. post-op after myomectomy in 2006. s/p TEE/DCCV.   Transient atrial fibrillation ([place]) 2006   a. after myomectomy at [facility] in 08/2004 - was on amiodarone but developed elevated LFTs felt possibly secondary to amiodarone     Past Surgical History:  Procedure Laterality Date   CARDIOVERSION N/A 08/12/2015   Procedure: CARDIOVERSION;  Surgeon: Satira Sark, MD;  Location: [facility] ENDO [facility];  Service: Cardiovascular;  Laterality: N/A;   CARDIOVERSION N/A 08/24/2020   Procedure: CARDIOVERSION;  Surgeon: Jerline Pain, MD;  Location: [facility] ENDOSCOPY;  Service: Cardiovascular;  Laterality: N/A;   Septal myomectomy  2006   [facility]   TEE WITHOUT CARDIOVERSION N/A 03/29/2016   Procedure: TRANSESOPHAGEAL ECHOCARDIOGRAM (TEE);  Surgeon: Lelon Perla, MD;  Location: [facility] END[facility]COPY;  Service: Cardiovascular;  Laterality: N/A;   TEE WITHOUT CARDIOVERSION N/A 06/23/2016   Procedure: TRANSESOPHAGEAL ECHOCARDIOGRAM (TEE);  Surgeon: Thayer Headings, MD;  Location: [facility] ENDOSCOPY;  Service: Cardiovascular;  Laterality: N/A;   TUBAL LIGATION      Current Medications: Current Meds  Medication Sig   albuterol (PROVENTIL) (2.5 MG/3ML) 0.083% nebulizer solution Take 2.5 mg by nebulization every 6 (six) hours as needed for wheezing or shortness of breath.   ALPRAZolam (XANAX) 0.25 MG tablet Take 1 tablet (0.25 mg total)  by mouth 3 (three) times daily as needed for anxiety.   aspirin EC 81 MG tablet Take 81 mg by mouth daily.   Budeson-Glycopyrrol-Formoterol (BREZTRI AEROSPHERE) 160-9-4.8 MCG/ACT AERO Take 2 puffs first thing in am and then another 2 puffs about 12 hours later.   dabigatran (PRADAXA) 150 MG CAPS capsule Take 1 capsule (150 mg total) by mouth 2 (two) times daily.   diltiazem (CARDIZEM CD) 180 MG 24 hr capsule Take 180 mg by mouth  daily.   diltiazem (CARDIZEM) 30 MG tablet TAKE (1) TABLET BY MOUTH EVERY FOUR HOURS AS NEEDED FOR AFIB HEART RATE OVER 100. (Patient taking differently: Take 30 mg by mouth every 4 (four) hours as needed (AFIB HEART RATE OVER 100).)   furosemide (LASIX) 20 MG tablet Take 1 tablet (20 mg total) by mouth daily as needed for edema.   ipratropium (ATROVENT) 0.02 % nebulizer solution Take 0.25 mg by nebulization every 4 (four) hours as needed for wheezing or shortness of breath.   metoprolol tartrate (LOPRESSOR) 25 MG tablet Take 1 tablet (25 mg total) by mouth 2 (two) times daily.   omeprazole (PRILOSEC) 40 MG capsule Take 40 mg by mouth daily.   oxyCODONE (OXY IR/ROXICODONE) 5 MG immediate release tablet Take 1 tablet (5 mg total) by mouth every 6 (six) hours as needed for moderate pain, breakthrough pain or severe pain.   potassium chloride (KLOR-CON) 10 MEQ tablet Take 10 mEq by mouth daily as needed (when taking lasix).   PROAIR HFA 108 (90 Base) MCG/ACT inhaler Inhale 2 puffs into the lungs every 6 (six) hours as needed for wheezing or shortness of breath.      Allergies:   Amiodarone   Social History   Socioeconomic History   Marital status: Married    Spouse name: Not on file   Number of children: Not on file   Years of education: Not on file   Highest education level: Not on file  Occupational History   Occupation: Housekeeper  Tobacco Use   Smoking status: Former    Packs/day: 0.50    Types: Cigarettes    Quit date: 12/11/2020    Years since quitting: 0.3   Smokeless tobacco: Never   Tobacco comments:    6-8 cigarettes daily  Vaping Use   Vaping Use: Never used  Substance and Sexual Activity   Alcohol use: No    Alcohol/week: 0.0 standard drinks   Drug use: No   Sexual activity: Not on file  Other Topics Concern   Not on file  Social History Narrative   Not on file   Social Determinants of Health   Financial Resource Strain: Not on file  Food Insecurity: Not on file   Transportation Needs: Not on file  Physical Activity: Not on file  Stress: Not on file  Social Connections: Not on file     Family History: The patient's family history includes Diabetes in an other family member; Heart failure in her father; Lung disease in an other family member.  ROS:   Please see the history of present illness.     All other systems reviewed and are negative.  EKGs/Labs/Other Studies Reviewed:    The following studies were reviewed today:  ECHO 02/28/21:   1. Left ventricular ejection fraction, by estimation, is 65 to 70%. The  left ventricle has normal function. The left ventricle has no regional  wall motion abnormalities. There is moderate asymmetric left ventricular  hypertrophy of the septal segment.  Left  ventricular diastolic parameters are consistent with Grade III  diastolic dysfunction (restrictive).   2. Right ventricular systolic function is low normal. The right  ventricular size is mildly enlarged. Mildly increased right ventricular  wall thickness. There is mildly elevated pulmonary artery systolic  pressure. The estimated right ventricular systolic   pressure is 03.5 mmHg.   3. Left atrial size was severely dilated.   4. Right atrial size was moderately dilated.   5. Moderate pericardial effusion. The pericardial effusion is posterior  to the left ventricle.   6. The mitral valve is abnormal. Moderate mitral valve regurgitation.  Moderate mitral annular calcification.   7. Tricuspid valve regurgitation is mild to moderate.   8. The aortic valve is tricuspid. Aortic valve regurgitation is not  visualized.   9. The inferior vena cava is normal in size with greater than 50%  respiratory variability, suggesting right atrial pressure of 3 mmHg.   Comparison(s): Prior images reviewed side by side. LVEF remains normal.  There is moderate LVH including septal hypertrophy related to [place]. Patient  reported to be status post septal myomectomy. No  obvious LVOT gradient at  rest.   CT scan of chest 02/28/2021-three-vessel coronary artery calcification noted.  EKG:  EKG is  ordered today.  The ekg ordered today demonstrates atrial flutter/fibrillation 120 bpm variable conduction with nonspecific ST-T wave changes noted.  Recent Labs: 02/27/2021: B Natriuretic Peptide 610.0 02/28/2021: ALT 16 03/09/2021: Magnesium 2.1 04/01/2021: BUN 16; Creatinine, Ser 0.96; Hemoglobin 9.9; Platelets 438.0; Potassium 4.5; Pro B Natriuretic peptide (BNP) 710.0; Sodium 137; TSH 2.66  Recent Lipid Panel No results found for: CHOL, TRIG, HDL, CHOLHDL, VLDL, LDLCALC, LDLDIRECT              Physical Exam:    VS:  BP (!) 106/58   Pulse (!) 120   Ht 5\' 1"  (1.549 m)   Wt 185 lb 3.2 oz (84 kg)   SpO2 94%   BMI 34.99 kg/m     Wt Readings from Last 3 Encounters:  04/07/21 185 lb 3.2 oz (84 kg)  04/01/21 181 lb (82.1 kg)  03/09/21 171 lb 3.2 oz (77.7 kg)     GEN:  Well nourished, well developed in no acute distress HEENT: Normal NECK: No JVD; No carotid bruits LYMPHATICS: No lymphadenopathy CARDIAC: irreg irreg tachy, no murmurs, rubs, gallops RESPIRATORY:  Clear to auscultation without rales, wheezing or rhonchi  ABDOMEN: Soft, non-tender, non-distended MUSCULOSKELETAL:  No edema; No deformity  SKIN: Warm and dry NEUROLOGIC:  Alert and oriented x 3 PSYCHIATRIC:  Normal affect   ASSESSMENT:    1. Atypical atrial flutter ([place])   2. Atrial flutter, unspecified type ([place])   3. COPD with acute exacerbation ([place])   4. History of ventricular septal myectomy   5. Chronic anticoagulation    PLAN:    In order of problems listed above:  Atypical atrial flutter (HCC) Heart rate currently 120 bpm.  We will go ahead and set her up for a cardioversion.  She has not missed any doses of Pradaxa.  Continue with other medications.  I have also discussed with her the possibility of repeat ablation.  I would like for her to discuss this further with Dr.  Quentin Ore.  Prior ablation was done at [facility] in 2018.  Does not tolerate amiodarone.  COPD with acute exacerbation ([place]) Recent hospitalization.  Notes reviewed.  History of ventricular septal myectomy Performed at [facility] in the early 2000's.  Chronic anticoagulation Continue with Pradaxa  150 mg twice a day.  No bleeding.  Last creatinine 0.96, last hemoglobin 9.9.    Shared Decision Making/Informed Consent The risks (stroke, cardiac arrhythmias rarely resulting in the need for a temporary or permanent pacemaker, skin irritation or burns and complications associated with conscious sedation including aspiration, arrhythmia, respiratory failure and death), benefits (restoration of normal sinus rhythm) and alternatives of a direct current cardioversion were explained in detail to Ms. Hassinger and she agrees to proceed.      Medication Adjustments/Labs and Tests Ordered: Current medicines are reviewed at length with the patient today.  Concerns regarding medicines are outlined above.  Orders Placed This Encounter  Procedures   Ambulatory referral to Cardiac Electrophysiology   EKG 12-Lead   No orders of the defined types were placed in this encounter.   Patient Instructions  Medication Instructions:   Your physician recommends that you continue on your current medications as directed. Please refer to the Current Medication list given to you today.   *If you need a refill on your cardiac medications before your next appointment, please call your pharmacy*   Lab Work: [place]    If you have labs (blood work) drawn today and your tests are completely normal, you will receive your results only by: [place] (if you have MyChart) OR A paper copy in the mail If you have any lab test that is abnormal or we need to change your treatment, we will call you to review the results.    Testing/Procedures: for atrial flutter/atrial fibrillation.Your physician has recommended  that you have a Cardioversion (DCCV). Electrical Cardioversion uses a jolt of electricity to your heart either through paddles or wired patches attached to your chest. This is a controlled, usually prescheduled, procedure. Defibrillation is done under light anesthesia in the hospital, and you usually go home the day of the procedure. This is done to get your heart back into a normal rhythm. You are not awake for the procedure. Please see the instruction sheet given to you today.    Follow-Up: At [facility], you and your health needs are our priority.  As part of our continuing mission to provide you with exceptional heart care, we have created designated Provider Care Teams.  These Care Teams include your primary Cardiologist (physician) and Advanced Practice Providers (APPs -  Physician Assistants and Nurse Practitioners) who all work together to provide you with the care you need, when you need it.  We recommend signing up for the patient portal called "MyChart".  Sign up information is provided on this After Visit Summary.  MyChart is used to connect with patients for Virtual Visits (Telemedicine).  Patients are able to view lab/test results, encounter notes, upcoming appointments, etc.  Non-urgent messages can be sent to your provider as well.   To learn more about what you can do with MyChart, go to [email or web address].     Your next appointment:  AFTER CARDIOVERSION FOR ATRIAL FIBRILLATION/ATRIAL FLUTTER  You have been referred to Electrophysiology Department   The format for your next appointment:   In Person  Provider:   Lars Mage, MD   Other Instructions   Signed, Candee Furbish, MD  04/07/2021 12:45 PM    [place]

## 2021-04-07 NOTE — Assessment & Plan Note (Signed)
Performed at [facility] in the early 2000's.

## 2021-04-07 NOTE — Assessment & Plan Note (Signed)
Heart rate currently 120 bpm.  We will go ahead and set her up for a cardioversion.  She has not missed any doses of Pradaxa.  Continue with other medications.  I have also discussed with her the possibility of repeat ablation.  I would like for her to discuss this further with Dr. Quentin Ore.  Prior ablation was done at [facility] in 2018.  Does not tolerate amiodarone.

## 2021-04-07 NOTE — Assessment & Plan Note (Signed)
Continue with Pradaxa 150 mg twice a day.  No bleeding.  Last creatinine 0.96, last hemoglobin 9.9.

## 2021-04-07 NOTE — Assessment & Plan Note (Signed)
Recent hospitalization.  Notes reviewed.

## 2021-04-07 NOTE — Progress Notes (Signed)
Cardiology Office Note:    Date:  04/07/2021   ID:  Kathleen Wright, DOB [birth year], MRN [id number]  PCP:  Ginger Organ   [facility] Providers Cardiologist:  None     Referring MD: Heath Lark D, DO    History of Present Illness:    Kathleen Wright is a 55 y.o. female here for evaluation of hypertrophic cardiomyopathy post myomectomy at [facility] in 2006, paroxysmal atrial fibrillation with recent discharge from hospital on 03/09/2021.  Her symptoms were worsening dyspnea and she was admitted with hypoxic respiratory failure multifactorial in the setting of chronic diastolic heart failure as well as COPD exacerbation.  She was given IV steroids breathing treatments Rocephin and azithromycin and then IV diuresis with Lasix which required several days before she reached euvolemia.  On 03/06/2021 she did have an episode of atrial fibrillation with rapid ventricular response and was transferred to the stepdown unit and was placed on a Cardizem drip.  She stabilized shortly after this.  She was sent to [facility] skilled nursing facility on discharge.  She had some minor delirium during the hospitalization thought to be secondary to steroids.  In the past she has had some elevated liver enzymes with amiodarone.  She has been taking Pradaxa 150 mg twice a day for her atrial fibrillation as well as diltiazem 180 mg once a day.  She has metoprolol 25 mg twice a day as well.  She was sent out with furosemide 20 mg as needed for edema.  She was last seen in the atrial fibrillation clinic by Roderic Palau, NP on 09/04/2020.  Previously she had felt as though she was in atrial fibrillation and had successful cardioversion on 09/05/2020 and had remained in sinus rhythm until episode on 03/06/2021.  Today, she is back in atrial flutter/fibrillation with heart rate of 120 bpm.  Frustrated.  Past Medical History:  Diagnosis Date   Asthma    Cervical cancer ([place])    a. treated with partial removal  of cervix   COPD (chronic obstructive pulmonary disease) ([place])    Hypertrophic cardiomyopathy ([place])    a. s/p myomectomy at [facility] 08/2004   Paroxysmal atrial flutter (HCC)    a. post-op after myomectomy in 2006. s/p TEE/DCCV.   Transient atrial fibrillation ([place]) 2006   a. after myomectomy at [facility] in 08/2004 - was on amiodarone but developed elevated LFTs felt possibly secondary to amiodarone     Past Surgical History:  Procedure Laterality Date   CARDIOVERSION N/A 08/12/2015   Procedure: CARDIOVERSION;  Surgeon: Satira Sark, MD;  Location[facility];  Service: Cardiovascular;  Laterality: N/A;   CARDIOVERSION N/A 08/24/2020   Procedure: CARDIOVERSION;  Surgeon: Jerline Pain, MD;  Location: [facility] ENDOSCOPY;  Service: Cardiovascular;  Laterality: N/A;   Septal myomectomy  2006   [facility]   TEE WITHOUT CARDIOVERSION N/A 03/29/2016   Procedure: TRANSESOPHAGEAL ECHOCARDIOGRAM (TEE);  Surgeon: Lelon Perla, MD;  Location: [facility] ENDOSCOPY;  Service: Cardiovascular;  Laterality: N/A;   TEE WITHOUT CARDIOVERSION N/A 06/23/2016   Procedure: TRANSESOPHAGEAL ECHOCARDIOGRAM (TEE);  Surgeon: Thayer Headings, MD;  Location: [facility] ENDOSCOPY;  Service: Cardiovascular;  Laterality: N/A;   TUBAL LIGATION      Current Medications: Current Meds  Medication Sig   albuterol (PROVENTIL) (2.5 MG/3ML) 0.083% nebulizer solution Take 2.5 mg by nebulization every 6 (six) hours as needed for wheezing or shortness of breath.   ALPRAZolam (XANAX) 0.25 MG tablet Take 1 tablet (0.25 mg total)  by mouth 3 (three) times daily as needed for anxiety.   aspirin EC 81 MG tablet Take 81 mg by mouth daily.   Budeson-Glycopyrrol-Formoterol (BREZTRI AEROSPHERE) 160-9-4.8 MCG/ACT AERO Take 2 puffs first thing in am and then another 2 puffs about 12 hours later.   dabigatran (PRADAXA) 150 MG CAPS capsule Take 1 capsule (150 mg total) by mouth 2 (two) times daily.   diltiazem (CARDIZEM CD) 180 MG 24 hr capsule Take 180 mg by mouth  daily.   diltiazem (CARDIZEM) 30 MG tablet TAKE (1) TABLET BY MOUTH EVERY FOUR HOURS AS NEEDED FOR AFIB HEART RATE OVER 100. (Patient taking differently: Take 30 mg by mouth every 4 (four) hours as needed (AFIB HEART RATE OVER 100).)   furosemide (LASIX) 20 MG tablet Take 1 tablet (20 mg total) by mouth daily as needed for edema.   ipratropium (ATROVENT) 0.02 % nebulizer solution Take 0.25 mg by nebulization every 4 (four) hours as needed for wheezing or shortness of breath.   metoprolol tartrate (LOPRESSOR) 25 MG tablet Take 1 tablet (25 mg total) by mouth 2 (two) times daily.   omeprazole (PRILOSEC) 40 MG capsule Take 40 mg by mouth daily.   oxyCODONE (OXY IR/ROXICODONE) 5 MG immediate release tablet Take 1 tablet (5 mg total) by mouth every 6 (six) hours as needed for moderate pain, breakthrough pain or severe pain.   potassium chloride (KLOR-CON) 10 MEQ tablet Take 10 mEq by mouth daily as needed (when taking lasix).   PROAIR HFA 108 (90 Base) MCG/ACT inhaler Inhale 2 puffs into the lungs every 6 (six) hours as needed for wheezing or shortness of breath.      Allergies:   Amiodarone   Social History   Socioeconomic History   Marital status: Married    Spouse name: Not on file   Number of children: Not on file   Years of education: Not on file   Highest education level: Not on file  Occupational History   Occupation: Housekeeper  Tobacco Use   Smoking status: Former    Packs/day: 0.50    Types: Cigarettes    Quit date: 12/11/2020    Years since quitting: 0.3   Smokeless tobacco: Never   Tobacco comments:    6-8 cigarettes daily  Vaping Use   Vaping Use: Never used  Substance and Sexual Activity   Alcohol use: No    Alcohol/week: 0.0 standard drinks   Drug use: No   Sexual activity: Not on file  Other Topics Concern   Not on file  Social History Narrative   Not on file   Social Determinants of Health   Financial Resource Strain: Not on file  Food Insecurity: Not on file   Transportation Needs: Not on file  Physical Activity: Not on file  Stress: Not on file  Social Connections: Not on file     Family History: The patient's family history includes Diabetes in an other family member; Heart failure in her father; Lung disease in an other family member.  ROS:   Please see the history of present illness.     All other systems reviewed and are negative.  EKGs/Labs/Other Studies Reviewed:    The following studies were reviewed today:  ECHO 02/28/21:   1. Left ventricular ejection fraction, by estimation, is 65 to 70%. The  left ventricle has normal function. The left ventricle has no regional  wall motion abnormalities. There is moderate asymmetric left ventricular  hypertrophy of the septal segment.  Left  ventricular diastolic parameters are consistent with Grade III  diastolic dysfunction (restrictive).   2. Right ventricular systolic function is low normal. The right  ventricular size is mildly enlarged. Mildly increased right ventricular  wall thickness. There is mildly elevated pulmonary artery systolic  pressure. The estimated right ventricular systolic   pressure is 74.1 mmHg.   3. Left atrial size was severely dilated.   4. Right atrial size was moderately dilated.   5. Moderate pericardial effusion. The pericardial effusion is posterior  to the left ventricle.   6. The mitral valve is abnormal. Moderate mitral valve regurgitation.  Moderate mitral annular calcification.   7. Tricuspid valve regurgitation is mild to moderate.   8. The aortic valve is tricuspid. Aortic valve regurgitation is not  visualized.   9. The inferior vena cava is normal in size with greater than 50%  respiratory variability, suggesting right atrial pressure of 3 mmHg.   Comparison(s): Prior images reviewed side by side. LVEF remains normal.  There is moderate LVH including septal hypertrophy related to [place]. Patient  reported to be status post septal myomectomy. No  obvious LVOT gradient at  rest.   CT scan of chest 02/28/2021-three-vessel coronary artery calcification noted.  EKG:  EKG is  ordered today.  The ekg ordered today demonstrates atrial flutter/fibrillation 120 bpm variable conduction with nonspecific ST-T wave changes noted.  Recent Labs: 02/27/2021: B Natriuretic Peptide 610.0 02/28/2021: ALT 16 03/09/2021: Magnesium 2.1 04/01/2021: BUN 16; Creatinine, Ser 0.96; Hemoglobin 9.9; Platelets 438.0; Potassium 4.5; Pro B Natriuretic peptide (BNP) 710.0; Sodium 137; TSH 2.66  Recent Lipid Panel No results found for: CHOL, TRIG, HDL, CHOLHDL, VLDL, LDLCALC, LDLDIRECT              Physical Exam:    VS:  BP (!) 106/58   Pulse (!) 120   Ht 5\' 1"  (1.549 m)   Wt 185 lb 3.2 oz (84 kg)   SpO2 94%   BMI 34.99 kg/m     Wt Readings from Last 3 Encounters:  04/07/21 185 lb 3.2 oz (84 kg)  04/01/21 181 lb (82.1 kg)  03/09/21 171 lb 3.2 oz (77.7 kg)     GEN:  Well nourished, well developed in no acute distress HEENT: Normal NECK: No JVD; No carotid bruits LYMPHATICS: No lymphadenopathy CARDIAC: irreg irreg tachy, no murmurs, rubs, gallops RESPIRATORY:  Clear to auscultation without rales, wheezing or rhonchi  ABDOMEN: Soft, non-tender, non-distended MUSCULOSKELETAL:  No edema; No deformity  SKIN: Warm and dry NEUROLOGIC:  Alert and oriented x 3 PSYCHIATRIC:  Normal affect   ASSESSMENT:    1. Atypical atrial flutter ([place])   2. Atrial flutter, unspecified type ([place])   3. COPD with acute exacerbation ([place])   4. History of ventricular septal myectomy   5. Chronic anticoagulation    PLAN:    In order of problems listed above:  Atypical atrial flutter (HCC) Heart rate currently 120 bpm.  We will go ahead and set her up for a cardioversion.  She has not missed any doses of Pradaxa.  Continue with other medications.  I have also discussed with her the possibility of repeat ablation.  I would like for her to discuss this further with Dr.  Quentin Ore.  Prior ablation was done at [facility] in 2018.  Does not tolerate amiodarone.  COPD with acute exacerbation ([place]) Recent hospitalization.  Notes reviewed.  History of ventricular septal myectomy Performed at [facility] in the early 2000's.  Chronic anticoagulation Continue with Pradaxa  150 mg twice a day.  No bleeding.  Last creatinine 0.96, last hemoglobin 9.9.    Shared Decision Making/Informed Consent The risks (stroke, cardiac arrhythmias rarely resulting in the need for a temporary or permanent pacemaker, skin irritation or burns and complications associated with conscious sedation including aspiration, arrhythmia, respiratory failure and death), benefits (restoration of normal sinus rhythm) and alternatives of a direct current cardioversion were explained in detail to Ms. Domino and she agrees to proceed.      Medication Adjustments/Labs and Tests Ordered: Current medicines are reviewed at length with the patient today.  Concerns regarding medicines are outlined above.  Orders Placed This Encounter  Procedures   Ambulatory referral to Cardiac Electrophysiology   EKG 12-Lead   No orders of the defined types were placed in this encounter.   Patient Instructions  Medication Instructions:   Your physician recommends that you continue on your current medications as directed. Please refer to the Current Medication list given to you today.   *If you need a refill on your cardiac medications before your next appointment, please call your pharmacy*   Lab Work: [place]    If you have labs (blood work) drawn today and your tests are completely normal, you will receive your results only by: [place] (if you have MyChart) OR A paper copy in the mail If you have any lab test that is abnormal or we need to change your treatment, we will call you to review the results.    Testing/Procedures: for atrial flutter/atrial fibrillation.Your physician has recommended  that you have a Cardioversion (DCCV). Electrical Cardioversion uses a jolt of electricity to your heart either through paddles or wired patches attached to your chest. This is a controlled, usually prescheduled, procedure. Defibrillation is done under light anesthesia in the hospital, and you usually go home the day of the procedure. This is done to get your heart back into a normal rhythm. You are not awake for the procedure. Please see the instruction sheet given to you today.    Follow-Up: At [facility], you and your health needs are our priority.  As part of our continuing mission to provide you with exceptional heart care, we have created designated Provider Care Teams.  These Care Teams include your primary Cardiologist (physician) and Advanced Practice Providers (APPs -  Physician Assistants and Nurse Practitioners) who all work together to provide you with the care you need, when you need it.  We recommend signing up for the patient portal called "MyChart".  Sign up information is provided on this After Visit Summary.  MyChart is used to connect with patients for Virtual Visits (Telemedicine).  Patients are able to view lab/test results, encounter notes, upcoming appointments, etc.  Non-urgent messages can be sent to your provider as well.   To learn more about what you can do with MyChart, go to [email or web address].     Your next appointment:  AFTER CARDIOVERSION FOR ATRIAL FIBRILLATION/ATRIAL FLUTTER  You have been referred to Electrophysiology Department   The format for your next appointment:   In Person  Provider:   Lars Mage, MD   Other Instructions   Signed, Candee Furbish, MD  04/07/2021 12:45 PM    [place]

## 2021-04-07 NOTE — Patient Instructions (Addendum)
Medication Instructions:   Your physician recommends that you continue on your current medications as directed. Please refer to the Current Medication list given to you today.   *If you need a refill on your cardiac medications before your next appointment, please call your pharmacy*   Lab Work: [place]    If you have labs (blood work) drawn today and your tests are completely normal, you will receive your results only by: [place] (if you have MyChart) OR A paper copy in the mail If you have any lab test that is abnormal or we need to change your treatment, we will call you to review the results.    Testing/Procedures: for atrial flutter/atrial fibrillation.Your physician has recommended that you have a Cardioversion (DCCV). Electrical Cardioversion uses a jolt of electricity to your heart either through paddles or wired patches attached to your chest. This is a controlled, usually prescheduled, procedure. Defibrillation is done under light anesthesia in the hospital, and you usually go home the day of the procedure. This is done to get your heart back into a normal rhythm. You are not awake for the procedure. Please see the instruction sheet given to you today.    Follow-Up: At [facility], you and your health needs are our priority.  As part of our continuing mission to provide you with exceptional heart care, we have created designated Provider Care Teams.  These Care Teams include your primary Cardiologist (physician) and Advanced Practice Providers (APPs -  Physician Assistants and Nurse Practitioners) who all work together to provide you with the care you need, when you need it.  We recommend signing up for the patient portal called "MyChart".  Sign up information is provided on this After Visit Summary.  MyChart is used to connect with patients for Virtual Visits (Telemedicine).  Patients are able to view lab/test results, encounter notes, upcoming  appointments, etc.  Non-urgent messages can be sent to your provider as well.   To learn more about what you can do with MyChart, go to [email or web address].     Your next appointment:  AFTER CARDIOVERSION FOR ATRIAL FIBRILLATION/ATRIAL FLUTTER  You have been referred to Electrophysiology Department   The format for your next appointment:   In Person  Provider:   Lars Mage, MD   Other Instructions

## 2021-04-08 LAB — ALPHA-1-ANTITRYPSIN PHENOTYP: A-1 Antitrypsin: 203 mg/dL — ABNORMAL HIGH (ref 101–187)

## 2021-04-08 NOTE — Patient Instructions (Signed)
Kathleen Wright  04/08/2021     @PREFPERIOPPHARMACY @   Your procedure is scheduled on  04/13/2021.   Report to [facility] at  0730 A.M.   Call this number if you have problems the morning of surgery:  [phone]   Remember:  Do not eat or drink after midnight.        DO NOT miss any doses of your pradaxa.    Take these medicines the morning of surgery with A SIP OF WATER          xanax(if needed), diltiazem,prilosec, oxycodone (if needed)    Do not wear jewelry, make-up or nail polish.  Do not wear lotions, powders, or perfumes, or deodorant.  Do not shave 48 hours prior to surgery.  Men may shave face and neck.  Do not bring valuables to the hospital.  [facility] is not responsible for any belongings or valuables.  Contacts, dentures or bridgework may not be worn into surgery.  Leave your suitcase in the car.  After surgery it may be brought to your room.  For patients admitted to the hospital, discharge time will be determined by your treatment team.  Patients discharged the day of surgery will not be allowed to drive home and must have someone with them for 24 hours.    Special instructions:   DO NOT smoke tobacco or vape for 24 hours before your procedure.  Please read over the following fact sheets that you were given. Anesthesia Post-op Instructions and Care and Recovery After Surgery      Electrical Cardioversion Electrical cardioversion is the delivery of a jolt of electricity to restore a normal rhythm to the heart. A rhythm that is too fast or is not regular keeps the heart from pumping well. In this procedure, sticky patches or metal paddles are placed on the chest to deliver electricity to the heart from a device. This procedure may be done in an emergency if: There is low or no blood pressure as a result of the heart rhythm. Normal rhythm must be restored as fast as possible to protect the brain and heart from further damage. It may save a  life. This may also be a scheduled procedure for irregular or fast heart rhythms that are not immediately life-threatening. Tell a health care provider about: Any allergies you have. All medicines you are taking, including vitamins, herbs, eye drops, creams, and over-the-counter medicines. Any problems you or family members have had with anesthetic medicines. Any blood disorders you have. Any surgeries you have had. Any medical conditions you have. Whether you are pregnant or may be pregnant. What are the risks? Generally, this is a safe procedure. However, problems may occur, including: Allergic reactions to medicines. A blood clot that breaks free and travels to other parts of your body. The possible return of an abnormal heart rhythm within hours or days after the procedure. Your heart stopping (cardiac arrest). This is rare. What happens before the procedure? Medicines Your health care provider may have you start taking: Blood-thinning medicines (anticoagulants) so your blood does not clot as easily. Medicines to help stabilize your heart rate and rhythm. Ask your health care provider about: Changing or stopping your regular medicines. This is especially important if you are taking diabetes medicines or blood thinners. Taking medicines such as aspirin and ibuprofen. These medicines can thin your blood. Do not take these medicines unless your health care provider tells you to take them. Taking  over-the-counter medicines, vitamins, herbs, and supplements. General instructions Follow instructions from your health care provider about eating or drinking restrictions. Plan to have someone take you home from the hospital or clinic. If you will be going home right after the procedure, plan to have someone with you for 24 hours. Ask your health care provider what steps will be taken to help prevent infection. These may include washing your skin with a germ-killing soap. What happens during  the procedure?  An IV will be inserted into one of your veins. Sticky patches (electrodes) or metal paddles may be placed on your chest. You will be given a medicine to help you relax (sedative). An electrical shock will be delivered. The procedure may vary among health care providers and hospitals. What can I expect after the procedure? Your blood pressure, heart rate, breathing rate, and blood oxygen level will be monitored until you leave the hospital or clinic. Your heart rhythm will be watched to make sure it does not change. You may have some redness on the skin where the shocks were given. Follow these instructions at home: Do not drive for 24 hours if you were given a sedative during your procedure. Take over-the-counter and prescription medicines only as told by your health care provider. Ask your health care provider how to check your pulse. Check it often. Rest for 48 hours after the procedure or as told by your health care provider. Avoid or limit your caffeine use as told by your health care provider. Keep all follow-up visits as told by your health care provider. This is important. Contact a health care provider if: You feel like your heart is beating too quickly or your pulse is not regular. You have a serious muscle cramp that does not go away. Get help right away if: You have discomfort in your chest. You are dizzy or you feel faint. You have trouble breathing or you are short of breath. Your speech is slurred. You have trouble moving an arm or leg on one side of your body. Your fingers or toes turn cold or blue. Summary Electrical cardioversion is the delivery of a jolt of electricity to restore a normal rhythm to the heart. This procedure may be done right away in an emergency or may be a scheduled procedure if the condition is not an emergency. Generally, this is a safe procedure. After the procedure, check your pulse often as told by your health care  provider. This information is not intended to replace advice given to you by your health care provider. Make sure you discuss any questions you have with your health care provider. Document Revised: 12/31/2018 Document Reviewed: 12/31/2018 Elsevier Patient Education  [place] After This sheet gives you information about how to care for yourself after your procedure. Your health care provider may also give you more specific instructions. If you have problems or questions, contact your health care provider. What can I expect after the procedure? After the procedure, it is common to have: Tiredness. Forgetfulness about what happened after the procedure. Impaired judgment for important decisions. Nausea or vomiting. Some difficulty with balance. Follow these instructions at home: For the time period you were told by your health care provider:   Rest as needed. Do not participate in activities where you could fall or become injured. Do not drive or use machinery. Do not drink alcohol. Do not take sleeping pills or medicines that cause drowsiness. Do not make important decisions or  sign legal documents. Do not take care of children on your own. Eating and drinking Follow the diet that is recommended by your health care provider. Drink enough fluid to keep your urine pale yellow. If you vomit: Drink water, juice, or soup when you can drink without vomiting. Make sure you have little or no nausea before eating solid foods. General instructions Have a responsible adult stay with you for the time you are told. It is important to have someone help care for you until you are awake and alert. Take over-the-counter and prescription medicines only as told by your health care provider. If you have sleep apnea, surgery and certain medicines can increase your risk for breathing problems. Follow instructions from your health care provider about wearing your  sleep device: Anytime you are sleeping, including during daytime naps. While taking prescription pain medicines, sleeping medicines, or medicines that make you drowsy. Avoid smoking. Keep all follow-up visits as told by your health care provider. This is important. Contact a health care provider if: You keep feeling nauseous or you keep vomiting. You feel light-headed. You are still sleepy or having trouble with balance after 24 hours. You develop a rash. You have a fever. You have redness or swelling around the IV site. Get help right away if: You have trouble breathing. You have new-onset confusion at home. Summary For several hours after your procedure, you may feel tired. You may also be forgetful and have poor judgment. Have a responsible adult stay with you for the time you are told. It is important to have someone help care for you until you are awake and alert. Rest as told. Do not drive or operate machinery. Do not drink alcohol or take sleeping pills. Get help right away if you have trouble breathing, or if you suddenly become confused. This information is not intended to replace advice given to you by your health care provider. Make sure you discuss any questions you have with your health care provider. Document Revised: 02/13/2020 Document Reviewed: 05/02/2019 Elsevier Patient Education  2022 [employer].

## 2021-04-09 ENCOUNTER — Encounter (HOSPITAL_COMMUNITY): Payer: Self-pay

## 2021-04-09 ENCOUNTER — Encounter (HOSPITAL_COMMUNITY)
Admission: RE | Admit: 2021-04-09 | Discharge: 2021-04-09 | Disposition: A | Discharge status: Home / Self Care | Payer: Medicare Other | Source: Ambulatory Visit | Attending: Cardiology | Admitting: Cardiology

## 2021-04-09 VITALS — BP 99/83 | HR 95 | Temp 98.4°F | Resp 18 | Ht 61.0 in | Wt 185.2 lb

## 2021-04-09 DIAGNOSIS — R0609 Other forms of dyspnea: Secondary | ICD-10-CM | POA: Insufficient documentation

## 2021-04-09 DIAGNOSIS — Z79899 Other long term (current) drug therapy: Secondary | ICD-10-CM | POA: Insufficient documentation

## 2021-04-09 DIAGNOSIS — I484 Atypical atrial flutter: Secondary | ICD-10-CM | POA: Insufficient documentation

## 2021-04-09 DIAGNOSIS — Z01812 Encounter for preprocedural laboratory examination: Secondary | ICD-10-CM | POA: Insufficient documentation

## 2021-04-09 HISTORY — DX: Sleep apnea, unspecified: G47.30

## 2021-04-09 LAB — BASIC METABOLIC PANEL
Anion gap: 8 (ref 5–15)
BUN: 13 mg/dL (ref 6–20)
CO2: 27 mmol/L (ref 22–32)
Calcium: 9 mg/dL (ref 8.9–10.3)
Chloride: 102 mmol/L (ref 98–111)
Creatinine, Ser: 0.71 mg/dL (ref 0.44–1.00)
GFR, Estimated: >60 mL/min (ref >60)
Glucose, Bld: 109 mg/dL — ABNORMAL HIGH (ref 70–99)
Potassium: 4.2 mmol/L (ref 3.5–5.1)
Sodium: 137 mmol/L (ref 135–145)

## 2021-04-09 LAB — PROTIME-INR
INR: 1.7 — ABNORMAL HIGH (ref 0.8–1.2)
Prothrombin Time: 20.3 seconds — ABNORMAL HIGH (ref 11.4–15.2)

## 2021-04-13 ENCOUNTER — Ambulatory Visit (HOSPITAL_COMMUNITY): Payer: Medicare Other | Admitting: Certified Registered Nurse Anesthetist

## 2021-04-13 ENCOUNTER — Encounter (HOSPITAL_COMMUNITY)
Admission: RE | Disposition: A | Discharge status: Home / Self Care | Payer: Self-pay | Source: Ambulatory Visit | Attending: Cardiology

## 2021-04-13 ENCOUNTER — Encounter (HOSPITAL_COMMUNITY): Payer: Self-pay | Admitting: Cardiology

## 2021-04-13 ENCOUNTER — Ambulatory Visit (HOSPITAL_COMMUNITY)
Admission: RE | Admit: 2021-04-13 | Discharge: 2021-04-13 | Disposition: A | Discharge status: Home / Self Care | Payer: Medicare Other | Source: Ambulatory Visit | Attending: Cardiology | Admitting: Cardiology

## 2021-04-13 DIAGNOSIS — Z7982 Long term (current) use of aspirin: Secondary | ICD-10-CM | POA: Insufficient documentation

## 2021-04-13 DIAGNOSIS — Z8249 Family history of ischemic heart disease and other diseases of the circulatory system: Secondary | ICD-10-CM | POA: Insufficient documentation

## 2021-04-13 DIAGNOSIS — Z79899 Other long term (current) drug therapy: Secondary | ICD-10-CM | POA: Diagnosis not present

## 2021-04-13 DIAGNOSIS — Z7901 Long term (current) use of anticoagulants: Secondary | ICD-10-CM | POA: Insufficient documentation

## 2021-04-13 DIAGNOSIS — I480 Paroxysmal atrial fibrillation: Secondary | ICD-10-CM | POA: Insufficient documentation

## 2021-04-13 DIAGNOSIS — I484 Atypical atrial flutter: Secondary | ICD-10-CM | POA: Diagnosis not present

## 2021-04-13 DIAGNOSIS — J441 Chronic obstructive pulmonary disease with (acute) exacerbation: Secondary | ICD-10-CM | POA: Insufficient documentation

## 2021-04-13 DIAGNOSIS — Z87891 Personal history of nicotine dependence: Secondary | ICD-10-CM | POA: Insufficient documentation

## 2021-04-13 DIAGNOSIS — Z888 Allergy status to other drugs, medicaments and biological substances status: Secondary | ICD-10-CM | POA: Diagnosis not present

## 2021-04-13 DIAGNOSIS — J449 Chronic obstructive pulmonary disease, unspecified: Secondary | ICD-10-CM | POA: Diagnosis not present

## 2021-04-13 DIAGNOSIS — I422 Other hypertrophic cardiomyopathy: Secondary | ICD-10-CM | POA: Diagnosis not present

## 2021-04-13 HISTORY — PX: CARDIOVERSION: SHX1299

## 2021-04-13 SURGERY — CARDIOVERSION
Anesthesia: General

## 2021-04-13 MED ORDER — LACTATED RINGERS IV SOLN: INTRAVENOUS | Status: DC

## 2021-04-13 MED ORDER — CHLORHEXIDINE GLUCONATE 0.12 % MT SOLN: 15.0000 mL | Freq: Once | OROMUCOSAL | Status: DC

## 2021-04-13 MED ORDER — PROPOFOL 10 MG/ML IV BOLUS
INTRAVENOUS | Status: DC | PRN
  Administered 2021-04-13: 80 mg via INTRAVENOUS

## 2021-04-13 MED ORDER — ORAL CARE MOUTH RINSE: 15.0000 mL | Freq: Once | OROMUCOSAL | Status: DC

## 2021-04-13 NOTE — Anesthesia Postprocedure Evaluation (Signed)
Anesthesia Post Note  Patient: Kathleen Wright  Procedure(s) Performed: CARDIOVERSION  Patient location during evaluation: Phase II Anesthesia Type: General Level of consciousness: awake Pain management: pain level controlled Vital Signs Assessment: post-procedure vital signs reviewed and stable Respiratory status: spontaneous breathing and respiratory function stable Cardiovascular status: blood pressure returned to baseline and stable Postop Assessment: no headache and no apparent nausea or vomiting Anesthetic complications: no Comments: Late entry   No notable events documented.   Last Vitals:  Vitals:   04/13/21 0945 04/13/21 1003  BP: 98/63 98/73  Pulse: 62 62  Resp: 18 18  Temp:  36.8 C  SpO2: 95% 94%    Last Pain:  Vitals:   04/13/21 1003  TempSrc: Oral  PainSc:                  Louann Sjogren

## 2021-04-13 NOTE — Anesthesia Preprocedure Evaluation (Signed)
Anesthesia Evaluation  Patient identified by MRN, date of birth, ID band Patient awake    Reviewed: Allergy & Precautions, H&P , NPO status , Patient's Chart, lab work & pertinent test results, reviewed documented beta blocker date and time   Airway Mallampati: II  TM Distance: >3 FB Neck ROM: full    Dental no notable dental hx.    Pulmonary asthma , sleep apnea , COPD, former smoker,    Pulmonary exam normal breath sounds clear to auscultation       Cardiovascular Exercise Tolerance: Good + DOE   Rhythm:irregular Rate:Tachycardia     Neuro/Psych negative neurological ROS  negative psych ROS   GI/Hepatic negative GI ROS, Neg liver ROS,   Endo/Other  negative endocrine ROS  Renal/GU negative Renal ROS  negative genitourinary   Musculoskeletal   Abdominal   Peds  Hematology negative hematology ROS (+)   Anesthesia Other Findings   Reproductive/Obstetrics negative OB ROS                             Anesthesia Physical Anesthesia Plan  ASA: 3  Anesthesia Plan: General   Post-op Pain Management:    Induction:   PONV Risk Score and Plan: Propofol infusion  Airway Management Planned:   Additional Equipment:   Intra-op Plan:   Post-operative Plan:   Informed Consent: I have reviewed the patients History and Physical, chart, labs and discussed the procedure including the risks, benefits and alternatives for the proposed anesthesia with the patient or authorized representative who has indicated his/her understanding and acceptance.     Dental Advisory Given  Plan Discussed with: CRNA  Anesthesia Plan Comments:         Anesthesia Quick Evaluation

## 2021-04-13 NOTE — Transfer of Care (Signed)
Immediate Anesthesia Transfer of Care Note  Patient: Kathleen Wright  Procedure(s) Performed: CARDIOVERSION  Patient Location: PACU  Anesthesia Type:General  Level of Consciousness: awake  Airway & Oxygen Therapy: Patient Spontanous Breathing and Patient connected to nasal cannula oxygen  Post-op Assessment: Report given to RN and Post -op Vital signs reviewed and stable  Post vital signs: Reviewed and stable  Last Vitals:  Vitals Value Taken Time  BP 76/51   Temp 98.2   Pulse 54   Resp 14   SpO2 99%     Last Pain:  Vitals:   04/13/21 0801  TempSrc: Oral         Complications: No notable events documented.

## 2021-04-13 NOTE — Interval H&P Note (Signed)
History and Physical Interval Note:  04/13/2021 8:21 AM  [name]  has presented today for surgery, with the diagnosis of a-fib.  The various methods of treatment have been discussed with the patient and family. After consideration of risks, benefits and other options for treatment, the patient has consented to  Procedure(s): CARDIOVERSION (N/A) as a surgical intervention.  The patient's history has been reviewed, patient examined, no change in status, stable for surgery.  I have reviewed the patient's chart and labs.  Questions were answered to the patient's satisfaction.     [name]

## 2021-04-13 NOTE — Progress Notes (Signed)
Electrical Cardioversion Procedure Note ELLAMARIE NAEVE [id number] 1966-04-21  Procedure: Electrical Cardioversion Indications:  Atrial Flutter  Procedure Details Consent: Risks of procedure as well as the alternatives and risks of each were explained to the (patient/caregiver).  Consent for procedure obtained. Time Out: Verified patient identification, verified procedure, site/side was marked, verified correct patient position, special equipment/implants available, medications/allergies/relevent history reviewed, required imaging and test results available.  Performed  Patient placed on cardiac monitor, pulse oximetry, supplemental oxygen as necessary.  Sedation given:  propofol Pacer pads placed anterior and posterior chest.  Cardioverted 1 time(s).  Cardioverted at 100.  Evaluation Findings: Post procedure EKG shows: NSR Complications: None Patient did tolerate procedure well.   Jarome Lamas Kairav Russomanno 04/13/2021, 9:06 AM

## 2021-04-13 NOTE — CV Procedure (Signed)
Elective direct-current cardioversion  Indication: Persistent atypical atrial flutter  Description of procedure: After informed consent was obtained, patient was taken to the PACU where a timeout was performed.  Anterior and posterior pads were placed in standard fashion and connected to a biphasic defibrillator.  Deep sedation was achieved via use of propofol per the anesthesia service.  WIth sandbag on anterior chest pad, a single synchronized 100 J shock was delivered with restoration of sinus bradycardia and then sinus rhythm.  Postconversion pause noted.  Patient was hemodynamically stable throughout and there were no immediate complications.  Sinus rhythm with PACs confirmed by ECG.  Disposition: Patient will be discharged in the care of her daughter after appropriate observation post anesthesia.  She already has scheduled follow-up with Dr. Quentin Ore to discuss further efforts at rhythm management.  No changes in present regimen.  Satira Sark, M.D., F.A.C.C.

## 2021-04-14 ENCOUNTER — Telehealth: Payer: Self-pay | Admitting: Cardiology

## 2021-04-14 ENCOUNTER — Telehealth: Payer: Self-pay | Admitting: Pulmonary Disease

## 2021-04-14 ENCOUNTER — Encounter (HOSPITAL_COMMUNITY): Payer: Self-pay | Admitting: Cardiology

## 2021-04-14 NOTE — Telephone Encounter (Signed)
Spoke with pt and advised per Dr Marlou Porch increase Furosemide 20mg  to 40mg  (2 tablets) by mouth twice daily x 3 days.  Pt verbalizes understanding and agrees with current plan.

## 2021-04-14 NOTE — Telephone Encounter (Signed)
Spoke with pt who reports current O2 sat is 89.  Pt states she began having some mild increased SOB and wheezing since last night.  Pt is able to have a conversation and does not sound SOB on the phone.  Pt with history of COPD and exacerbation requiring hospitalization 6 weeks ago.  Pt denies current fever, chills or edema.  She does not feel her heart is out of rhythm.  She does have productive cough with greenish/brown sputum.  She took a breathing treatment last night and has used her inhaler today.  She is taking medications as prescribed.  Pt is scheduled to see Dr Quentin Ore on 05/04/2021.  Pt states she contacted her pulmonologist today as well and was advised to contact cardiology.  Pt advised to contact her PCP as well as pulmonology again to discuss current symptoms.  Will forward information to Dr Marlou Porch for review and recommendation.  Reviewed ED precautions with pt.

## 2021-04-14 NOTE — Telephone Encounter (Signed)
Patient states her oxygen has been ranging 80-87, but her normal parameters are 94-97. She states she is unsure whether or not this has anything to do with 11/01 cardioversion Dr. [name], however,  she would like to inform Dr. Marlou Porch. Please return call to discuss when able.

## 2021-04-14 NOTE — Telephone Encounter (Signed)
Called and spoke with patient. She stated that she had a cardioversion done yesterday. Today her O2 levels have been running 80-87% on room air. She does not have any O2 at home. I asked her if she had called Dr. Myles Gip office to make them aware, she replied she has not.   I advised her to call his office and make them aware as this could be a complication from the procedure. She verbalized understanding.   Nothing further needed at time of call.

## 2021-04-14 NOTE — Telephone Encounter (Signed)
Agree with plan. Would also take her lasix 40mg  PO BID for 3 days.  Thanks  Candee Furbish, MD

## 2021-04-15 NOTE — Telephone Encounter (Signed)
VS please advise.  You have nothing open in your schedule .  She see's you in RDS.  thanks

## 2021-04-22 DIAGNOSIS — J449 Chronic obstructive pulmonary disease, unspecified: Secondary | ICD-10-CM | POA: Diagnosis not present

## 2021-04-28 ENCOUNTER — Encounter: Payer: Self-pay | Admitting: Pulmonary Disease

## 2021-04-28 ENCOUNTER — Ambulatory Visit: Payer: Medicare Other | Admitting: Pulmonary Disease

## 2021-04-28 ENCOUNTER — Other Ambulatory Visit: Payer: Self-pay

## 2021-04-28 VITALS — BP 114/68 | HR 68 | Temp 98.2°F | Ht 61.0 in | Wt 188.0 lb

## 2021-04-28 DIAGNOSIS — R0683 Snoring: Secondary | ICD-10-CM | POA: Diagnosis not present

## 2021-04-28 DIAGNOSIS — G894 Chronic pain syndrome: Secondary | ICD-10-CM | POA: Diagnosis not present

## 2021-04-28 DIAGNOSIS — I4891 Unspecified atrial fibrillation: Secondary | ICD-10-CM | POA: Diagnosis not present

## 2021-04-28 NOTE — Patient Instructions (Signed)
Will arrange for home sleep study Will call to arrange for follow up after sleep study reviewed  

## 2021-04-28 NOTE — Progress Notes (Signed)
Kathleen Wright, Kathleen Wright, and Kathleen Wright  Chief Complaint  Patient presents with   Consult    Patient is suspected to have Kathleen apnea. Low O2 while sleeping, hallucinations during hospital stay.     Past Surgical History:  She  has a past surgical history that includes Septal myomectomy (06/13/2004); Tubal ligation; Cardioversion (N/A, 08/12/2015); TEE without cardioversion (N/A, 03/29/2016); TEE without cardioversion (N/A, 06/23/2016); Cardioversion (N/A, 08/24/2020); transesophageal echocardiogram (2005); and Cardioversion (N/A, 04/13/2021).  Past Medical History:  Asthma, Cervical cancer, COPD, Hypertrophic cardiomyopathy, PAF, HTN  Constitutional:  BP 114/68 (BP Location: Left Arm, Patient Position: Sitting)   Pulse 68   Temp 98.2 F (36.8 C) (Temporal)   Ht 5\' 1"  (1.549 m)   Wt 188 lb (85.3 kg)   SpO2 94% Comment: ra  BMI 35.52 kg/m   Brief Summary:  Kathleen Wright is a 55 y.o. female former smoker with obstructive Kathleen apnea and COPD.      Subjective:   She is here with her daughter.  She was in hospital in September for A fib and COPD exacerbation with hypoxia and hypercapnia.  She was having trouble with hallucinations in the hospital.  This improved after she was started on oxygen and Bipap.  Felt to also have steroid induced psychosis.  She was seen by Dr. Melvyn Wright after discharge.  Started on breztri, but this made her breathing worse.  She stopped breztri and has been using albuterol 2 to 3 times per day.  She has tried several other inhalers and all of them made her breathing worse and caused increased cough.  She had PFT ordered by Dr. Melvyn Wright.  She had cardioversion at beginning of November.  She feels her breathing got worse and her oxygen level was running low again.  She snores at night and is a restless sleeper.  She wakes up feeling like she can't breathing.  She has to nap during the day while watching TV.  She goes to Kathleen at 11 pm.  She  falls asleep after a while.  She wakes up 3 times to use the bathroom.  She gets out of bed at 7 am.  She feels tired in the morning.  She denies morning headache.  She does not use anything to help her fall Kathleen or stay awake.  She denies Kathleen walking, Kathleen talking, bruxism, or nightmares.  There is no history of restless legs.  She denies Kathleen hallucinations, Kathleen paralysis, or cataplexy.  The Epworth score is 9 out of 24.  Ambulatory oximetry today on room air: lowest SpO2 92%, but only could walk 2 laps before stopping due to dyspnea.   Physical Exam:   Appearance - well kempt   ENMT - no sinus tenderness, no oral exudate, no LAN, Mallampati 3 airway, no stridor, wears dentures  Respiratory - equal breath sounds bilaterally, no wheezing or rales  CV - s1s2 regular rate and rhythm, no murmurs  Ext - no clubbing, no edema  Skin - no rashes  Psych - normal mood and affect   Wright testing:  ABG with 32% FiO2 03/02/21 >> pH 7.29, PCO2 64, PO2 81.3 A1AT 04/01/21 >> 203, MM IgE 04/01/21 >> 11  Chest Imaging:  CT angio chest 02/28/21 >> bronchial wall thickening, multifocal pneumonia  Kathleen Tests:    Cardiac Tests:  Echo 02/28/21 >> EF 65 to 70%, mod LVH, grade 3 DD, RVSP 43.4 mmHg, severe LA dilation, mod RA dilation, mod pericardial effusion, mod MR, mild/mod  TR  Social History:  She  reports that she quit smoking about 4 months ago. Her smoking use included cigarettes. She smoked an average of .5 packs per day. She has never used smokeless tobacco. She reports that she does not drink alcohol and does not use drugs.  Family History:  Her family history includes Diabetes in an other family member; Heart failure in her father; Lung disease in an other family member.    Discussion:  She has snoring, Kathleen disruption, apnea, and daytime sleepiness.  She has history of hypertension and atrial fibrillation.  She had elevated carbon dioxide level during acute illness when  in hospital in September 2022.  I am concerned she could have obstructive Kathleen apnea.  She has prior history of smoking.  She carries a diagnosis of COPD.  She has difficulty tolerating inhaler therapy, and has only been able to tolerate albuterol thus far.  Assessment/Plan:   Snoring with excessive daytime sleepiness. - will need to arrange for a home Kathleen study  History of tobacco abuse with presumed COPD. - she was not able to tolerate breztri due to increased cough and dyspnea - continue prn albuterol - will make sure PFT has been scheduled  Atrial fibrillation, valvular heart disease. - followed by Dr. Candee Furbish with [place]  Obesity. - discussed how weight can impact Kathleen and risk for Kathleen disordered breathing - discussed options to assist with weight loss: combination of diet modification, cardiovascular and strength training exercises  Cardiovascular risk. - had an extensive discussion regarding the adverse health consequences related to untreated Kathleen disordered breathing - specifically discussed the risks for hypertension, coronary artery disease, cardiac dysrhythmias, cerebrovascular disease, and diabetes - lifestyle modification discussed  Safe driving practices. - discussed how Kathleen disruption can increase risk of accidents, particularly when driving - safe driving practices were discussed  Therapies for obstructive Kathleen apnea. - if the Kathleen study shows significant Kathleen apnea, then various therapies for treatment were reviewed: CPAP, oral appliance, and surgical interventions  Time Spent Involved in Patient Wright on Day of Examination:  42 minutes  Follow up:   Patient Instructions  Will arrange for home Kathleen study Will call to arrange for follow up after Kathleen study reviewed  Medication List:   Allergies as of 04/28/2021       Reactions   Amiodarone Other (See Comments)   Liver function        Medication List        Accurate as  of April 28, 2021 11:10 AM. If you have any questions, ask your nurse or doctor.          STOP taking these medications    Breztri Aerosphere 160-9-4.8 MCG/ACT Aero Generic drug: Budeson-Glycopyrrol-Formoterol Stopped by: Kathleen Mires, MD       TAKE these medications    ALPRAZolam 0.25 MG tablet Commonly known as: Xanax Take 1 tablet (0.25 mg total) by mouth 3 (three) times daily as needed for anxiety.   aspirin EC 81 MG tablet Take 81 mg by mouth daily.   dabigatran 150 MG Caps capsule Commonly known as: Pradaxa Take 1 capsule (150 mg total) by mouth 2 (two) times daily.   diltiazem 180 MG 24 hr capsule Commonly known as: CARDIZEM CD Take 180 mg by mouth daily.   diltiazem 30 MG tablet Commonly known as: CARDIZEM TAKE (1) TABLET BY MOUTH EVERY FOUR HOURS AS NEEDED FOR AFIB HEART RATE OVER 100.   furosemide 20 MG tablet Commonly  known as: LASIX Take 1 tablet (20 mg total) by mouth daily as needed for edema. What changed: when to take this   hydroxypropyl methylcellulose / hypromellose 2.5 % ophthalmic solution Commonly known as: ISOPTO TEARS / GONIOVISC Place 1 drop into both eyes 2 (two) times daily as needed for dry eyes.   ipratropium 0.02 % nebulizer solution Commonly known as: ATROVENT Take 0.25 mg by nebulization every 4 (four) hours as needed for wheezing or shortness of breath.   metoprolol tartrate 25 MG tablet Commonly known as: LOPRESSOR Take 1 tablet (25 mg total) by mouth 2 (two) times daily.   omeprazole 40 MG capsule Commonly known as: PRILOSEC Take 40 mg by mouth daily.   oxyCODONE 5 MG immediate release tablet Commonly known as: Oxy IR/ROXICODONE Take 1 tablet (5 mg total) by mouth every 6 (six) hours as needed for moderate pain, breakthrough pain or severe pain.   potassium chloride 10 MEQ tablet Commonly known as: KLOR-CON Take 10 mEq by mouth daily.   ProAir HFA 108 (90 Base) MCG/ACT inhaler Generic drug: albuterol Inhale 2  puffs into the lungs every 6 (six) hours as needed for wheezing or shortness of breath.   VITAMIN C PO Take 1 tablet by mouth daily.   VITAMIN D3 PO Take 1 tablet by mouth daily.        Signature:  Kathleen Mires, MD [place] Pager - [phone] 04/28/2021, 11:10 AM

## 2021-05-04 ENCOUNTER — Ambulatory Visit: Payer: Medicare Other | Admitting: Cardiology

## 2021-05-04 ENCOUNTER — Other Ambulatory Visit: Payer: Self-pay

## 2021-05-04 ENCOUNTER — Encounter: Payer: Self-pay | Admitting: Cardiology

## 2021-05-04 VITALS — BP 134/84 | HR 63 | Ht 61.0 in | Wt 188.2 lb

## 2021-05-04 DIAGNOSIS — I421 Obstructive hypertrophic cardiomyopathy: Secondary | ICD-10-CM

## 2021-05-04 DIAGNOSIS — Z7901 Long term (current) use of anticoagulants: Secondary | ICD-10-CM

## 2021-05-04 DIAGNOSIS — I484 Atypical atrial flutter: Secondary | ICD-10-CM

## 2021-05-04 DIAGNOSIS — I5033 Acute on chronic diastolic (congestive) heart failure: Secondary | ICD-10-CM | POA: Diagnosis not present

## 2021-05-04 DIAGNOSIS — I480 Paroxysmal atrial fibrillation: Secondary | ICD-10-CM

## 2021-05-04 MED ORDER — TORSEMIDE 20 MG PO TABS: 40.0000 mg | ORAL_TABLET | Freq: Every day | ORAL | 3 refills | Status: DC

## 2021-05-04 MED ORDER — POTASSIUM CHLORIDE ER 10 MEQ PO TBCR: 20.0000 meq | EXTENDED_RELEASE_TABLET | Freq: Every day | ORAL | 3 refills | Status: DC

## 2021-05-04 NOTE — Progress Notes (Signed)
Electrophysiology Office Note:    Date:  05/04/2021   ID:  Kathleen Wright, DOB 12-Jul-1965, MRN [id number]  PCP:  Ginger Organ  [facility] Cardiologist:  None  [facility] Electrophysiologist:  Vickie Epley, MD   Referring MD: Jerline Pain, MD   Chief Complaint: Atrial fibrillation  History of Present Illness:    Kathleen Wright is a 55 y.o. female who presents for an evaluation of atrial fibrillation at the request of Dr. Marlou Porch. Their medical history includes hypertrophic cardiomyopathy post myectomy at [facility] in 2006, persistent atrial fibrillation and COPD.  She has had prior cardioversions for her atrial fibrillation.  When she saw Dr. Marlou Porch in clinic on October 26, she was back in atrial fibrillation/flutter with heart rates of 120.  She is on Pradaxa for stroke prophylaxis.  She had a catheter ablation in 2018 at [facility].  She did not tolerate amiodarone in the past.  Today she tells me her breathing is relatively stable.  She is not on home oxygen.  She has good days and bad days.  She can definitely tell when she is out of rhythm.  She tells me that since her most recent cardioversion she is put on at least 15 to 20 pounds of fluid.  This is mostly in the lower extremities.  She tells me that the Lasix is not working.    Past Medical History:  Diagnosis Date   Asthma    Cervical cancer ([place])    a. treated with partial removal of cervix   COPD (chronic obstructive pulmonary disease) ([place])    Hypertrophic cardiomyopathy ([place])    a. s/p myomectomy at [facility] 08/2004   Paroxysmal atrial flutter ([facility])    a. post-op after myomectomy in 2006. s/p TEE/DCCV.   Sleep apnea    Transient atrial fibrillation ([place]) 2006   a. after myomectomy at [facility] in 08/2004 - was on amiodarone but developed elevated LFTs felt possibly secondary to amiodarone     Past Surgical History:  Procedure Laterality Date   CARDIOVERSION N/A 08/12/2015   Procedure: CARDIOVERSION;  Surgeon:  Satira Sark, MD;  Location: [facility] ENDO SUITE;  Service: Cardiovascular;  Laterality: N/A;   CARDIOVERSION N/A 08/24/2020   Procedure: CARDIOVERSION;  Surgeon: Jerline Pain, MD;  Location: [facility] ENDOSCOPY;  Service: Cardiovascular;  Laterality: N/A;   CARDIOVERSION N/A 04/13/2021   Procedure: CARDIOVERSION;  Surgeon: Satira Sark, MD;  Location: [facility] ORS;  Service: Cardiovascular;  Laterality: N/A;   Septal myomectomy  06/13/2004   [facility]   TEE WITHOUT CARDIOVERSION N/A 03/29/2016   Procedure: TRANSESOPHAGEAL ECHOCARDIOGRAM (TEE);  Surgeon: Lelon Perla, MD;  Location: [facility] ENDOSCOPY;  Service: Cardiovascular;  Laterality: N/A;   TEE WITHOUT CARDIOVERSION N/A 06/23/2016   Procedure: TRANSESOPHAGEAL ECHOCARDIOGRAM (TEE);  Surgeon: Thayer Headings, MD;  Location[facility] ENDOSCOPY;  Service: Cardiovascular;  Laterality: N/A;   TRANSESOPHAGEAL ECHOCARDIOGRAM  2005   TUBAL LIGATION      Current Medications: Current Meds  Medication Sig   ALPRAZolam (XANAX) 0.25 MG tablet Take 1 tablet (0.25 mg total) by mouth 3 (three) times daily as needed for anxiety.   Ascorbic Acid (VITAMIN C PO) Take 1 tablet by mouth daily.   aspirin EC 81 MG tablet Take 81 mg by mouth daily.   Cholecalciferol (VITAMIN D3 PO) Take 1 tablet by mouth daily.   dabigatran (PRADAXA) 150 MG CAPS capsule Take 1 capsule (150 mg total) by mouth 2 (two) times daily.  diltiazem (CARDIZEM CD) 180 MG 24 hr capsule Take 180 mg by mouth daily.   diltiazem (CARDIZEM) 30 MG tablet TAKE (1) TABLET BY MOUTH EVERY FOUR HOURS AS NEEDED FOR AFIB HEART RATE OVER 100.   hydroxypropyl methylcellulose / hypromellose (ISOPTO TEARS / GONIOVISC) 2.5 % ophthalmic solution Place 1 drop into both eyes 2 (two) times daily as needed for dry eyes.   ipratropium (ATROVENT) 0.02 % nebulizer solution Take 0.25 mg by nebulization every 4 (four) hours as needed for wheezing or shortness of breath.   omeprazole (PRILOSEC) 40 MG capsule Take 40 mg by mouth  daily.   oxyCODONE (OXY IR/ROXICODONE) 5 MG immediate release tablet Take 1 tablet (5 mg total) by mouth every 6 (six) hours as needed for moderate pain, breakthrough pain or severe pain.   potassium chloride (KLOR-CON) 10 MEQ tablet Take 2 tablets (20 mEq total) by mouth daily.   PROAIR HFA 108 (90 Base) MCG/ACT inhaler Inhale 2 puffs into the lungs every 6 (six) hours as needed for wheezing or shortness of breath.    torsemide (DEMADEX) 20 MG tablet Take 2 tablets (40 mg total) by mouth daily.   [DISCONTINUED] furosemide (LASIX) 20 MG tablet Take 1 tablet (20 mg total) by mouth daily as needed for edema. (Patient taking differently: Take 20 mg by mouth 2 (two) times daily as needed for edema.)   [DISCONTINUED] potassium chloride (KLOR-CON) 10 MEQ tablet Take 10 mEq by mouth daily.     Allergies:   Amiodarone   Social History   Socioeconomic History   Marital status: Married    Spouse name: Not on file   Number of children: Not on file   Years of education: Not on file   Highest education level: Not on file  Occupational History   Occupation: Housekeeper  Tobacco Use   Smoking status: Former    Packs/day: 0.50    Types: Cigarettes    Quit date: 12/11/2020    Years since quitting: 0.3   Smokeless tobacco: Never   Tobacco comments:    6-8 cigarettes daily  Vaping Use   Vaping Use: Never used  Substance and Sexual Activity   Alcohol use: No    Alcohol/week: 0.0 standard drinks   Drug use: No   Sexual activity: Not on file  Other Topics Concern   Not on file  Social History Narrative   Not on file   Social Determinants of Health   Financial Resource Strain: Not on file  Food Insecurity: Not on file  Transportation Needs: Not on file  Physical Activity: Not on file  Stress: Not on file  Social Connections: Not on file     Family History: The patient's family history includes Diabetes in an other family member; Heart failure in her father; Lung disease in an other family  member.  ROS:   Please see the history of present illness.    All other systems reviewed and are negative.  EKGs/Labs/Other Studies Reviewed:    The following studies were reviewed today:  December 21, 2016 [facility] A. fib ablation Access:  -Right femoral vein: 9 Fr 35 cm BriteTip  -Right femoral vein: 8.5 Fr 63 cm SL1  -Right femoral vein: 8.5 Fr 90 cm Agilis small curl    Comments:  -CARTO fast anatomic map of right atrium to assist in localization of the septum for transseptal and for placement of coronary sinus catheter  -CARTO Sound map of the aortic root and aortic sinus used to merge with  segmented aorta anatomy from the cardiac CT scan  -Uncomplicated transseptal puncture with Baylis needle with intracardiac echocardiographic and fluoroscopic guidance  -Pre-aligned left atrial anatomy from the non-invasive imaging used to guide collection of fast anatomic map geometry for CARTO 3 mapping system.  CARTO Confidense collection of atrial substrate with the 2-6-2 F curve PentaRay while defining the  geometry.  After collection of the geometry the merge was refined.  -Wide antral circumferential ablation for isolation of the left sided pulmonary veins followed by the right sided pulmonary veins.  Left atrial intracardiac echocardiographic guidance for ablation  -Ablation output was limited on the posterior wall to 20W to minimize esophageal heating  -Synchronized direct current cardioversion with restoration of sinus rhythm  -Entrance and exit block confirmed in all veins all veins without adenosine secondary to intraoperative diminished blood pressure recordings   She was seen by Dr. Dwana Curd in follow-up in September with recurrence of her atrial fibrillation/flutter.   February 28, 2021 echo Left ventricular function normal, 65% Right ventricular function low normal Mildly enlarged right ventricle RVSP 43 Severely dilated left atrium Moderately dilated right atrium Moderate  pericardial effusion   EKG:  The ekg ordered today demonstrates sinus rhythm   Recent Labs: 02/27/2021: B Natriuretic Peptide 610.0 02/28/2021: ALT 16 03/09/2021: Magnesium 2.1 04/01/2021: Hemoglobin 9.9; Platelets 438.0; Pro B Natriuretic peptide (BNP) 710.0; TSH 2.66 04/09/2021: BUN 13; Creatinine, Ser 0.71; Potassium 4.2; Sodium 137  Recent Lipid Panel No results found for: CHOL, TRIG, HDL, CHOLHDL, VLDL, LDLCALC, LDLDIRECT  Physical Exam:    VS:  BP 134/84   Pulse 63   Ht 5\' 1"  (1.549 m)   Wt 188 lb 3.2 oz (85.4 kg)   SpO2 91%   BMI 35.56 kg/m     Wt Readings from Last 3 Encounters:  05/04/21 188 lb 3.2 oz (85.4 kg)  04/28/21 188 lb (85.3 kg)  04/09/21 185 lb 3.2 oz (84 kg)     GEN: Chronically ill-appearing in no distress HEENT: Normal NECK: No JVD; No carotid bruits LYMPHATICS: No lymphadenopathy CARDIAC: RRR, no murmurs, rubs, gallops.  Warm extremities.  2+ pitting edema to bilateral thighs. RESPIRATORY:  Clear to auscultation without rales, wheezing or rhonchi  ABDOMEN: Soft, non-tender, non-distended MUSCULOSKELETAL: 2+ pitting bilateral lower extremity edema as above.  No deformity  SKIN: Warm and dry NEUROLOGIC:  Alert and oriented x 3 PSYCHIATRIC:  Normal affect       ASSESSMENT:    1. PAF (paroxysmal atrial fibrillation) ([place])   2. Atypical atrial flutter (HCC)   3. Hypertrophic obstructive cardiomyopathy ([place])   4. Chronic anticoagulation    PLAN:    In order of problems listed above:  1. PAF (paroxysmal atrial fibrillation) (HCC) Highly symptomatic paroxysms of atrial fibrillation.  Given her trouble with diastolic heart failure, a rhythm control strategy is indicated.  She is failed amiodarone in the past due to side effects.  She is on dabigatran for stroke prophylaxis.  She is not a candidate for catheter ablation given her respiratory troubles.  We discussed alternative antiarrhythmic drug options including Tikosyn during today's visit.  I  think Tikosyn is the best choice for her and will allow [place] to avoid the potential for off target effects with amiodarone.  We discussed the process for loading Tikosyn and she is interested in proceeding.  We will get this arranged for her.  2. Atypical atrial flutter ([place]) See #1  3. Hypertrophic obstructive cardiomyopathy ([place]) Post myectomy at [facility] in the past.  Rhythm control is indicated.  4. Chronic anticoagulation On Pradaxa.  5.  Acute on chronic diastolic heart failure NYHA class III.  Warm and volume overloaded on exam today with significant lower extremity edema.  She thinks she is gained 15 to 20 pounds since recent cardioversion.  The Lasix is clearly not working.  We will discontinue the Lasix today and transition to torsemide 40 mg by mouth once daily.  We will increase the potassium to 20 mEq daily.  I will have her seen early next week with lab work at that visit to recheck her kidney function and electrolytes.  60 minutes spent during today's encounter including direct patient care, chart review.  Medication Adjustments/Labs and Tests Ordered: Current medicines are reviewed at length with the patient today.  Concerns regarding medicines are outlined above.  Orders Placed This Encounter  Procedures   EKG 12-Lead   Meds ordered this encounter  Medications   potassium chloride (KLOR-CON) 10 MEQ tablet    Sig: Take 2 tablets (20 mEq total) by mouth daily.    Dispense:  180 tablet    Refill:  3   torsemide (DEMADEX) 20 MG tablet    Sig: Take 2 tablets (40 mg total) by mouth daily.    Dispense:  180 tablet    Refill:  3     Signed, Bowe Sidor T. Quentin Ore, MD, [facility], [facility] 05/04/2021 7:01 PM    Electrophysiology [facility]

## 2021-05-04 NOTE — Patient Instructions (Addendum)
Medication Instructions:  Your physician has recommended you make the following change in your medication:    STOP lasix (furosemide)  2.   START taking torsemide 20 mg-  Take 2 tablets (40 mg) by mouth daily  3.   Increase your potassium 10 meq-  Take 2 tablets (20 meq) by mouth daily   Lab Work: None ordered. If you have labs (blood work) drawn today and your tests are completely normal, you will receive your results only by: [place] (if you have MyChart) OR A paper copy in the mail If you have any lab test that is abnormal or we need to change your treatment, we will call you to review the results.  Testing/Procedures: None ordered.  Follow-Up: At [facility], you and your health needs are our priority.  As part of our continuing mission to provide you with exceptional heart care, we have created designated Provider Care Teams.  These Care Teams include your primary Cardiologist (physician) and Advanced Practice Providers (APPs -  Physician Assistants and Nurse Practitioners) who all work together to provide you with the care you need, when you need it.  Your next appointment:    May 10, 2021 at 9:40 am with Tommye Standard, PA at the [facility] office.  Tikosyn (Dofetilide) Hospital Admission  Prior to day of admission: Check with drug insurance company for cost of drug to ensure affordability --- Dofetilide 500 mcg twice a day.  GoodRx is an option if insurance copay is unaffordable.  All patients are tested for COVID-19 prior to admission.  No Benadryl is allowed 3 days prior to admission.  Please ensure no missed doses of your anticoagulation (blood thinner) for 3 weeks prior to admission. If a dose is missed please notify our office immediately.  A pharmacist will review all your medications for potential interactions with Tikosyn. If any medication changes are needed prior to admission we will be in touch with you.  If any new medications are started AFTER  your admission date is set with Nurse, adult. Please notify our office immediately so your medication list can be updated and reviewed by our pharmacist again. On day of admission: Tikosyn initiation requires a 3 night/4 day hospital stay with constant telemetry monitoring. You will have an EKG after each dose of Tikosyn as well as daily lab draws.  If the drug does not convert you to normal rhythm a cardioversion after the 4th dose of Tikosyn.  Afib Clinic office visit on the morning of admission is needed for preliminary labs/ekg.  Time of admission is dependent on bed availability in the hospital. In some instances, you will be sent home until bed is available. Rarely admission can be delayed to the following day if hospital census prevents available beds.  You may bring personal belongings/clothing with you to the hospital. Please leave your suitcase in the car until you arrive in admissions.  Questions please call our office at [phone]

## 2021-05-05 ENCOUNTER — Telehealth: Payer: Self-pay | Admitting: Pharmacist

## 2021-05-05 NOTE — Telephone Encounter (Signed)
Medication list reviewed in anticipation of upcoming Tikosyn initiation. Patient is not taking any contraindicated or QTc prolonging medications.   One drug interaction to monitor:  Concurrent use of DILTIAZEM and DOFETILIDE may result in increased plasma concentration of dofetilide.  Monitor electrolytes while on torsemide and dofetilide.  Patient is anticoagulated on Pradaxa on the appropriate dose. Please ensure that patient has not missed any anticoagulation doses in the 3 weeks prior to Tikosyn initiation.   Patient will need to be counseled to avoid use of Benadryl while on Tikosyn and in the 2-3 days prior to Tikosyn initiation.

## 2021-05-09 NOTE — Progress Notes (Signed)
Cardiology Office Note Date:  05/09/2021  Patient ID:  Kathleen Wright, Graves 01-23-1966, MRN [id number] PCP:  Ginger Organ  Cardiologist:  Dr. Marlou Porch Electrophysiologist: Dr. Quentin Ore    Chief Complaint: planned f/u  History of Present Illness: ALIZAYA Kathleen Wright is a 55 y.o. female with history of HOCM (s/p myomectomy at [facility] 2006), chronic CHF (diastolic), COPD, AFib (prior ablation [facility] 2018)  Edema has been felt to be chronic (?), at her last AFib clinic visit, rec VVS eval  She saw Dr. Marlou Porch 04/07/21, was back in AF rates 120s, planned for DCCV and EP follow up  DCCV 04/13/2021 successful  She comes today to be seen for Dr. Quentin Ore, he saw her for the 1st consult and her only visit with him 05/04/21, not felt to be a repeat ablation candidate 2/2 chronic respiratory issues.  Planned for Tikosyn.  Though was markedly volume OL, the pt reporting lasix no longer effective. Lasix stopped, started on torsemide, her K+ adjusted and planned for early APP eval and labs.  TODAY She is doing much better, legs are back to normal, breathing easier.  She does not suspect she is in Afib today Afib when fast she feels palpitations can tell in her chest though usually makes her feel tired, without eneergy Denies any CP or cardiac awareness She reports compliance with her Pradaxa  She is scheduled for Tikosyn loading admission 05/25/21   AAD Hx Diagnosed 2006 (seems about the time of her myomectomy) Notes report intolerant of amiodarone 2/2 elevated LFTs Had a 10 hear hiatus of her AF though started back up about 2016 Planned for PVI ablation Aug 2017 though pre-procedure TEE + LAA thrombus and cancelled Rescheduled Oct 2017 though TEE again with improved thrombus NOT resolved and again cancelled  Planned to refer to [facility]  PVI ablation at [facility] 03/07/2017  Followed predominantly at the AFib clinic, has some AFib issues though the pt not wanted to consider repeat ablation (march 2021  visit), more Afib march 2022 felt provoked by personal stressors associated with her husband's illness/death.  May 22, 2021 referred to Dr. Quentin Ore, not felt a repeat PVI candidate> planned for Tikosyn    Past Medical History:  Diagnosis Date   Asthma    Cervical cancer ([place])    a. treated with partial removal of cervix   COPD (chronic obstructive pulmonary disease) ([place])    Hypertrophic cardiomyopathy ([place])    a. s/p myomectomy at [facility] 08/2004   Paroxysmal atrial flutter ([place])    a. post-op after myomectomy in 2006. s/p TEE/DCCV.   Sleep apnea    Transient atrial fibrillation ([place]) 2006   a. after myomectomy at [facility] in 08/2004 - was on amiodarone but developed elevated LFTs felt possibly secondary to amiodarone     Past Surgical History:  Procedure Laterality Date   CARDIOVERSION N/A 08/12/2015   Procedure: CARDIOVERSION;  Surgeon: Satira Sark, MD;  [facility] ENDO [facility];  Service: Cardiovascular;  Laterality: N/A;   CARDIOVERSION N/A 08/24/2020   Procedure: CARDIOVERSION;  Surgeon: Jerline Pain, MD;  Location: [facility] ENDOSCOPY;  Service: Cardiovascular;  Laterality: N/A;   CARDIOVERSION N/A 04/13/2021   Procedure: CARDIOVERSION;  Surgeon: Satira Sark, MD;  Location: [facility] ORS;  Service: Cardiovascular;  Laterality: N/A;   Septal myomectomy  06/13/2004   [facility]   TEE WITHOUT CARDIOVERSION N/A 03/29/2016   Procedure: TRANSESOPHAGEAL ECHOCARDIOGRAM (TEE);  Surgeon: Lelon Perla, MD;  Location: [facility];  Service: Cardiovascular;  Laterality: N/A;  TEE WITHOUT CARDIOVERSION N/A 06/23/2016   Procedure: TRANSESOPHAGEAL ECHOCARDIOGRAM (TEE);  Surgeon: Thayer Headings, MD;  Location: [facility] ENDOSCOPY;  Service: Cardiovascular;  Laterality: N/A;   TRANSESOPHAGEAL ECHOCARDIOGRAM  2005   TUBAL LIGATION      Current Outpatient Medications  Medication Sig Dispense Refill   ALPRAZolam (XANAX) 0.25 MG tablet Take 1 tablet (0.25 mg total) by mouth 3 (three) times daily as needed for  anxiety. 10 tablet 0   Ascorbic Acid (VITAMIN C PO) Take 1 tablet by mouth daily.     aspirin EC 81 MG tablet Take 81 mg by mouth daily.     Cholecalciferol (VITAMIN D3 PO) Take 1 tablet by mouth daily.     dabigatran (PRADAXA) 150 MG CAPS capsule Take 1 capsule (150 mg total) by mouth 2 (two) times daily. 60 capsule 6   diltiazem (CARDIZEM CD) 180 MG 24 hr capsule Take 180 mg by mouth daily.     diltiazem (CARDIZEM) 30 MG tablet TAKE (1) TABLET BY MOUTH EVERY FOUR HOURS AS NEEDED FOR AFIB HEART RATE OVER 100. 45 tablet 3   hydroxypropyl methylcellulose / hypromellose (ISOPTO TEARS / GONIOVISC) 2.5 % ophthalmic solution Place 1 drop into both eyes 2 (two) times daily as needed for dry eyes.     ipratropium (ATROVENT) 0.02 % nebulizer solution Take 0.25 mg by nebulization every 4 (four) hours as needed for wheezing or shortness of breath.     metoprolol tartrate (LOPRESSOR) 25 MG tablet Take 1 tablet (25 mg total) by mouth 2 (two) times daily. 60 tablet 2   omeprazole (PRILOSEC) 40 MG capsule Take 40 mg by mouth daily.     oxyCODONE (OXY IR/ROXICODONE) 5 MG immediate release tablet Take 1 tablet (5 mg total) by mouth every 6 (six) hours as needed for moderate pain, breakthrough pain or severe pain. 10 tablet 0   potassium chloride (KLOR-CON) 10 MEQ tablet Take 2 tablets (20 mEq total) by mouth daily. 180 tablet 3   PROAIR HFA 108 (90 Base) MCG/ACT inhaler Inhale 2 puffs into the lungs every 6 (six) hours as needed for wheezing or shortness of breath.      torsemide (DEMADEX) 20 MG tablet Take 2 tablets (40 mg total) by mouth daily. 180 tablet 3   No current facility-administered medications for this visit.    Allergies:   Amiodarone   Social History:  The patient  reports that she quit smoking about 4 months ago. Her smoking use included cigarettes. She smoked an average of .5 packs per day. She has never used smokeless tobacco. She reports that she does not drink alcohol and does not use drugs.    Family History:  The patient's family history includes Diabetes in an other family member; Heart failure in her father; Lung disease in an other family member.  ROS:  Please see the history of present illness.    All other systems are reviewed and otherwise negative.   PHYSICAL EXAM:  VS:  There were no vitals taken for this visit. BMI: There is no height or weight on file to calculate BMI. Well nourished, well developed, in no acute distress HEENT: normocephalic, atraumatic Neck: no JVD, carotid bruits or masses Cardiac:  RRR; no significant murmurs, no rubs, or gallops Lungs:  CTA b/l, no wheezing, rhonchi or rales Abd: soft, nontender MS: no deformity or atrophy Ext: no edema Skin: warm and dry, no rash Neuro:  No gross deficits appreciated Psych: euthymic mood, full affect    EKG:  Done today and reviewed by myself shows  SB 57bpm, manually measured QT 440-438ms, QTc 429-464ms   02/28/21: TTE IMPRESSIONS   1. Left ventricular ejection fraction, by estimation, is 65 to 70%. The  left ventricle has normal function. The left ventricle has no regional  wall motion abnormalities. There is moderate asymmetric left ventricular  hypertrophy of the septal segment.  Left ventricular diastolic parameters are consistent with Grade III  diastolic dysfunction (restrictive).   2. Right ventricular systolic function is low normal. The right  ventricular size is mildly enlarged. Mildly increased right ventricular  wall thickness. There is mildly elevated pulmonary artery systolic  pressure. The estimated right ventricular systolic   pressure is 15.1 mmHg.   3. Left atrial size was severely dilated.   4. Right atrial size was moderately dilated.   5. Moderate pericardial effusion. The pericardial effusion is posterior  to the left ventricle.   6. The mitral valve is abnormal. Moderate mitral valve regurgitation.  Moderate mitral annular calcification.   7. Tricuspid valve  regurgitation is mild to moderate.   8. The aortic valve is tricuspid. Aortic valve regurgitation is not  visualized.   9. The inferior vena cava is normal in size with greater than 50%  respiratory variability, suggesting right atrial pressure of 3 mmHg.   Comparison(s): Prior images reviewed side by side. LVEF remains normal.  There is moderate LVH including septal hypertrophy related to [place]. Patient  reported to be status post septal myomectomy. No obvious LVOT gradient at  rest.   Recent Labs: 02/27/2021: B Natriuretic Peptide 610.0 02/28/2021: ALT 16 03/09/2021: Magnesium 2.1 04/01/2021: Hemoglobin 9.9; Platelets 438.0; Pro B Natriuretic peptide (BNP) 710.0; TSH 2.66 04/09/2021: BUN 13; Creatinine, Ser 0.71; Potassium 4.2; Sodium 137  No results found for requested labs within last 8760 hours.   CrCl cannot be calculated (Patient's most recent lab result is older than the maximum 21 days allowed.).   Wt Readings from Last 3 Encounters:  05/04/21 188 lb 3.2 oz (85.4 kg)  04/28/21 188 lb (85.3 kg)  04/09/21 185 lb 3.2 oz (84 kg)     Other studies reviewed: Additional studies/records reviewed today include: summarized above  ASSESSMENT AND PLAN:  Paroxysmal Afib, atypical Aflutter CHA2DS2Vasc is 2, on Pradaxa, appropriately dosed Planned for Tikosyn admission once volume stable/euvolemic  She had a mod pericardial effusion on her echo in Sept Had a bit of a drop in H/H sep-Oct   Update labs today, plan for limited echo during her Tikosyn admission No rub, heart sounds not distant, BP is OK   HOCM Hx of myomectomy remotely No LVOT on most recent echo this year  Chronic CHF (diastolic) Recent adjustment in diuretic Appears euvolemic Labs today    Disposition: F/u with Afib c linic as scheduled for her Tikosyn initiation admission  Current medicines are reviewed at length with the patient today.  The patient did not have any concerns regarding  medicines.  Venetia Night, PA-C 05/09/2021 8:48 AM     [address] [place] [address] [phone] (office)  [phone] (fax)

## 2021-05-10 ENCOUNTER — Other Ambulatory Visit: Payer: Self-pay

## 2021-05-10 ENCOUNTER — Ambulatory Visit: Payer: Medicare Other | Admitting: Physician Assistant

## 2021-05-10 ENCOUNTER — Encounter: Payer: Self-pay | Admitting: Physician Assistant

## 2021-05-10 VITALS — BP 104/60 | HR 59 | Ht 61.0 in | Wt 176.0 lb

## 2021-05-10 DIAGNOSIS — I480 Paroxysmal atrial fibrillation: Secondary | ICD-10-CM | POA: Diagnosis not present

## 2021-05-10 DIAGNOSIS — I3139 Other pericardial effusion (noninflammatory): Secondary | ICD-10-CM

## 2021-05-10 DIAGNOSIS — I5032 Chronic diastolic (congestive) heart failure: Secondary | ICD-10-CM

## 2021-05-10 DIAGNOSIS — I421 Obstructive hypertrophic cardiomyopathy: Secondary | ICD-10-CM

## 2021-05-10 DIAGNOSIS — Z79899 Other long term (current) drug therapy: Secondary | ICD-10-CM | POA: Diagnosis not present

## 2021-05-10 LAB — BASIC METABOLIC PANEL
BUN/Creatinine Ratio: 20 (ref 9–23)
BUN: 21 mg/dL (ref 6–24)
CO2: 28 mmol/L (ref 20–29)
Calcium: 9.1 mg/dL (ref 8.7–10.2)
Chloride: 92 mmol/L — ABNORMAL LOW (ref 96–106)
Creatinine, Ser: 1.05 mg/dL — ABNORMAL HIGH (ref 0.57–1.00)
Glucose: 105 mg/dL — ABNORMAL HIGH (ref 70–99)
Potassium: 3.8 mmol/L (ref 3.5–5.2)
Sodium: 136 mmol/L (ref 134–144)
eGFR: 62 mL/min/{1.73_m2} (ref >59)

## 2021-05-10 LAB — CBC
Hematocrit: 31.8 % — ABNORMAL LOW (ref 34.0–46.6)
Hemoglobin: 10.5 g/dL — ABNORMAL LOW (ref 11.1–15.9)
MCH: 26 pg — ABNORMAL LOW (ref 26.6–33.0)
MCHC: 33 g/dL (ref 31.5–35.7)
MCV: 79 fL (ref 79–97)
Platelets: 397 10*3/uL (ref 150–450)
RBC: 4.04 x10E6/uL (ref 3.77–5.28)
RDW: 16 % — ABNORMAL HIGH (ref 11.7–15.4)
WBC: 11.5 10*3/uL — ABNORMAL HIGH (ref 3.4–10.8)

## 2021-05-10 LAB — MAGNESIUM: Magnesium: 1.8 mg/dL (ref 1.6–2.3)

## 2021-05-10 NOTE — Patient Instructions (Signed)
Medication Instructions:   Your physician recommends that you continue on your current medications as directed. Please refer to the Current Medication list given to you today.   *If you need a refill on your cardiac medications before your next appointment, please call your pharmacy*   Lab Work: BMET [place]    If you have labs (blood work) drawn today and your tests are completely normal, you will receive your results only by: [place] (if you have MyChart) OR A paper copy in the mail If you have any lab test that is abnormal or we need to change your treatment, we will call you to review the results.   Testing/Procedures: NONE ORDERED  TODAY    Follow-Up: At [facility], you and your health needs are our priority.  As part of our continuing mission to provide you with exceptional heart care, we have created designated Provider Care Teams.  These Care Teams include your primary Cardiologist (physician) and Advanced Practice Providers (APPs -  Physician Assistants and Nurse Practitioners) who all work together to provide you with the care you need, when you need it.  We recommend signing up for the patient portal called "MyChart".  Sign up information is provided on this After Visit Summary.  MyChart is used to connect with patients for Virtual Visits (Telemedicine).  Patients are able to view lab/test results, encounter notes, upcoming appointments, etc.  Non-urgent messages can be sent to your provider as well.   To learn more about what you can do with MyChart, go to [email or web address].    Your next appointment:  AS SCHEDULED   The format for your next appointment:   In Person  Provider:   You will follow up in the [facility] located at [facility]. Your provider will be: Roderic Palau, [facility]     Other Instructions

## 2021-05-11 ENCOUNTER — Other Ambulatory Visit: Payer: Self-pay | Admitting: *Deleted

## 2021-05-11 MED ORDER — POTASSIUM CHLORIDE ER 10 MEQ PO TBCR: 30.0000 meq | EXTENDED_RELEASE_TABLET | Freq: Every day | ORAL | 3 refills | Status: DC

## 2021-05-11 MED ORDER — MAGNESIUM OXIDE -MG SUPPLEMENT 200 MG PO TABS: 200.0000 mg | ORAL_TABLET | Freq: Every day | ORAL | 1 refills | Status: DC

## 2021-05-22 DIAGNOSIS — J449 Chronic obstructive pulmonary disease, unspecified: Secondary | ICD-10-CM | POA: Diagnosis not present

## 2021-05-24 ENCOUNTER — Other Ambulatory Visit (HOSPITAL_COMMUNITY): Payer: Self-pay | Admitting: Nurse Practitioner

## 2021-05-24 ENCOUNTER — Ambulatory Visit: Payer: Medicare Other | Admitting: Internal Medicine

## 2021-05-24 LAB — SARS CORONAVIRUS 2 (TAT 6-24 HRS): SARS Coronavirus 2: POSITIVE — AB

## 2021-05-25 ENCOUNTER — Ambulatory Visit (HOSPITAL_COMMUNITY): Payer: Medicare Other | Admitting: Nurse Practitioner

## 2021-06-03 ENCOUNTER — Ambulatory Visit: Payer: Medicare Other | Admitting: Pulmonary Disease

## 2021-06-03 DIAGNOSIS — G894 Chronic pain syndrome: Secondary | ICD-10-CM | POA: Diagnosis not present

## 2021-06-03 DIAGNOSIS — I872 Venous insufficiency (chronic) (peripheral): Secondary | ICD-10-CM | POA: Diagnosis not present

## 2021-06-03 DIAGNOSIS — M1991 Primary osteoarthritis, unspecified site: Secondary | ICD-10-CM | POA: Diagnosis not present

## 2021-06-03 DIAGNOSIS — M47816 Spondylosis without myelopathy or radiculopathy, lumbar region: Secondary | ICD-10-CM | POA: Diagnosis not present

## 2021-06-08 ENCOUNTER — Ambulatory Visit (HOSPITAL_COMMUNITY)
Admission: RE | Admit: 2021-06-08 | Discharge: 2021-06-08 | Disposition: A | Discharge status: Home / Self Care | Payer: Medicare Other | Source: Ambulatory Visit | Attending: Nurse Practitioner | Admitting: Nurse Practitioner

## 2021-06-08 ENCOUNTER — Inpatient Hospital Stay (HOSPITAL_COMMUNITY)
Admission: RE | Admit: 2021-06-08 | Discharge: 2021-06-11 | DRG: 310 | Disposition: A | Discharge status: Home / Self Care | Payer: Medicare Other | Source: Ambulatory Visit | Attending: Cardiology | Admitting: Cardiology

## 2021-06-08 ENCOUNTER — Encounter (HOSPITAL_COMMUNITY): Payer: Self-pay | Admitting: Cardiology

## 2021-06-08 ENCOUNTER — Other Ambulatory Visit: Payer: Self-pay

## 2021-06-08 VITALS — BP 108/56 | HR 46 | Ht 61.0 in | Wt 180.8 lb

## 2021-06-08 DIAGNOSIS — I4892 Unspecified atrial flutter: Secondary | ICD-10-CM | POA: Diagnosis not present

## 2021-06-08 DIAGNOSIS — I4819 Other persistent atrial fibrillation: Principal | ICD-10-CM | POA: Diagnosis present

## 2021-06-08 DIAGNOSIS — G4730 Sleep apnea, unspecified: Secondary | ICD-10-CM | POA: Diagnosis present

## 2021-06-08 DIAGNOSIS — I422 Other hypertrophic cardiomyopathy: Secondary | ICD-10-CM | POA: Diagnosis not present

## 2021-06-08 DIAGNOSIS — I480 Paroxysmal atrial fibrillation: Secondary | ICD-10-CM

## 2021-06-08 DIAGNOSIS — Z87891 Personal history of nicotine dependence: Secondary | ICD-10-CM

## 2021-06-08 DIAGNOSIS — J449 Chronic obstructive pulmonary disease, unspecified: Secondary | ICD-10-CM | POA: Diagnosis present

## 2021-06-08 DIAGNOSIS — Z8541 Personal history of malignant neoplasm of cervix uteri: Secondary | ICD-10-CM | POA: Diagnosis not present

## 2021-06-08 DIAGNOSIS — E876 Hypokalemia: Secondary | ICD-10-CM | POA: Diagnosis present

## 2021-06-08 DIAGNOSIS — Z8249 Family history of ischemic heart disease and other diseases of the circulatory system: Secondary | ICD-10-CM | POA: Diagnosis not present

## 2021-06-08 DIAGNOSIS — I4891 Unspecified atrial fibrillation: Secondary | ICD-10-CM | POA: Diagnosis present

## 2021-06-08 DIAGNOSIS — I421 Obstructive hypertrophic cardiomyopathy: Secondary | ICD-10-CM | POA: Diagnosis not present

## 2021-06-08 DIAGNOSIS — I472 Ventricular tachycardia, unspecified: Secondary | ICD-10-CM | POA: Diagnosis present

## 2021-06-08 DIAGNOSIS — D6869 Other thrombophilia: Secondary | ICD-10-CM

## 2021-06-08 LAB — BASIC METABOLIC PANEL
Anion gap: 10 (ref 5–15)
BUN: 18 mg/dL (ref 6–20)
CO2: 30 mmol/L (ref 22–32)
Calcium: 9.3 mg/dL (ref 8.9–10.3)
Chloride: 99 mmol/L (ref 98–111)
Creatinine, Ser: 0.96 mg/dL (ref 0.44–1.00)
GFR, Estimated: >60 mL/min (ref >60)
Glucose, Bld: 95 mg/dL (ref 70–99)
Potassium: 4.1 mmol/L (ref 3.5–5.1)
Sodium: 139 mmol/L (ref 135–145)

## 2021-06-08 LAB — MAGNESIUM: Magnesium: 1.9 mg/dL (ref 1.7–2.4)

## 2021-06-08 MED ORDER — MAGNESIUM OXIDE -MG SUPPLEMENT 400 (240 MG) MG PO TABS
200.0000 mg | ORAL_TABLET | Freq: Every day | ORAL | Status: DC
  Administered 2021-06-09 – 2021-06-11 (×3): 200 mg via ORAL
  Filled 2021-06-08 (×3): qty 1

## 2021-06-08 MED ORDER — METOPROLOL TARTRATE 25 MG PO TABS
25.0000 mg | ORAL_TABLET | Freq: Two times a day (BID) | ORAL | Status: DC
  Filled 2021-06-08: qty 1

## 2021-06-08 MED ORDER — ASPIRIN EC 81 MG PO TBEC
81.0000 mg | DELAYED_RELEASE_TABLET | Freq: Every day | ORAL | Status: DC
  Administered 2021-06-09 – 2021-06-11 (×3): 81 mg via ORAL
  Filled 2021-06-08 (×3): qty 1

## 2021-06-08 MED ORDER — TORSEMIDE 20 MG PO TABS
40.0000 mg | ORAL_TABLET | Freq: Every day | ORAL | Status: DC
  Administered 2021-06-09 – 2021-06-11 (×3): 40 mg via ORAL
  Filled 2021-06-08 (×3): qty 2

## 2021-06-08 MED ORDER — POTASSIUM CHLORIDE ER 10 MEQ PO TBCR
30.0000 meq | EXTENDED_RELEASE_TABLET | Freq: Every day | ORAL | Status: DC
  Filled 2021-06-08: qty 3

## 2021-06-08 MED ORDER — HYDROCODONE-ACETAMINOPHEN 10-325 MG PO TABS
1.0000 | ORAL_TABLET | ORAL | Status: DC
  Administered 2021-06-08 – 2021-06-11 (×16): 1 via ORAL
  Filled 2021-06-08 (×16): qty 1

## 2021-06-08 MED ORDER — SODIUM CHLORIDE 0.9% FLUSH: 3.0000 mL | INTRAVENOUS | Status: DC | PRN

## 2021-06-08 MED ORDER — HYPROMELLOSE (GONIOSCOPIC) 2.5 % OP SOLN
1.0000 [drp] | Freq: Two times a day (BID) | OPHTHALMIC | Status: DC | PRN
  Filled 2021-06-08: qty 15

## 2021-06-08 MED ORDER — PANTOPRAZOLE SODIUM 40 MG PO TBEC
40.0000 mg | DELAYED_RELEASE_TABLET | Freq: Every day | ORAL | Status: DC
  Administered 2021-06-09 – 2021-06-11 (×3): 40 mg via ORAL
  Filled 2021-06-08 (×3): qty 1

## 2021-06-08 MED ORDER — SODIUM CHLORIDE 0.9 % IV SOLN
250.0000 mL | INTRAVENOUS | Status: DC | PRN
  Administered 2021-06-08: 16:00:00 250 mL via INTRAVENOUS

## 2021-06-08 MED ORDER — DOFETILIDE 500 MCG PO CAPS
500.0000 ug | ORAL_CAPSULE | Freq: Two times a day (BID) | ORAL | Status: DC
  Administered 2021-06-08 – 2021-06-09 (×2): 500 ug via ORAL
  Filled 2021-06-08 (×2): qty 1

## 2021-06-08 MED ORDER — ALPRAZOLAM 0.25 MG PO TABS: 0.2500 mg | ORAL_TABLET | Freq: Three times a day (TID) | ORAL | Status: DC | PRN

## 2021-06-08 MED ORDER — DILTIAZEM HCL ER COATED BEADS 180 MG PO CP24
180.0000 mg | ORAL_CAPSULE | Freq: Every day | ORAL | Status: DC
  Administered 2021-06-09: 11:00:00 180 mg via ORAL
  Filled 2021-06-08: qty 1

## 2021-06-08 MED ORDER — SODIUM CHLORIDE 0.9% FLUSH
3.0000 mL | Freq: Two times a day (BID) | INTRAVENOUS | Status: DC
  Administered 2021-06-08 – 2021-06-11 (×4): 3 mL via INTRAVENOUS

## 2021-06-08 MED ORDER — DABIGATRAN ETEXILATE MESYLATE 150 MG PO CAPS
150.0000 mg | ORAL_CAPSULE | Freq: Two times a day (BID) | ORAL | Status: DC
  Administered 2021-06-08 – 2021-06-11 (×6): 150 mg via ORAL
  Filled 2021-06-08 (×7): qty 1

## 2021-06-08 MED ORDER — MAGNESIUM SULFATE 2 GM/50ML IV SOLN
2.0000 g | Freq: Once | INTRAVENOUS | Status: AC
  Administered 2021-06-08: 16:00:00 2 g via INTRAVENOUS
  Filled 2021-06-08: qty 50

## 2021-06-08 NOTE — H&P (Signed)
Electrophysiology H&P  Note   Primary Care Physician: Ginger Organ Referring Physician: Dr. Renaldo Harrison is a 55 y.o. female with a h/o a h/o persistent afib, HOCM, that had ablation at [facility], in July of  2018. She has had more afib occurrence with fluid retention and is now in the afib clinic for admission for Tikosyn as she is not a good repeat ablation candidate.   She is in SR today. No benadryl use, no meds that were contraindicated with Tikosyn. No missed anticoagulation for at least 3 weeks. She was positive for   covid 12/14 so additional testing was not needed. Qtc is acceptable.   Today, she denies symptoms of palpitations, chest pain, shortness of breath, orthopnea, PND, lower extremity edema, dizziness, presyncope, syncope, or neurologic sequela. The patient is tolerating medications without difficulties and is otherwise without complaint today.   Presents to [facility] for [facility] admission  Past Medical History:  Diagnosis Date   Asthma    Cervical cancer ([place])    a. treated with partial removal of cervix   COPD (chronic obstructive pulmonary disease) ([place])    Hypertrophic cardiomyopathy ([place])    a. s/p myomectomy at [facility] 08/2004   Paroxysmal atrial flutter ([place])    a. post-op after myomectomy in 2006. s/p TEE/DCCV.   Sleep apnea    Transient atrial fibrillation ([place]) 2006   a. after myomectomy at [facility] in 08/2004 - was on amiodarone but developed elevated LFTs felt possibly secondary to amiodarone    Past Surgical History:  Procedure Laterality Date   CARDIOVERSION N/A 08/12/2015   Procedure: CARDIOVERSION;  Surgeon: Satira Sark, MD;  [facility] ENDO [facility];  Service: Cardiovascular;  Laterality: N/A;   CARDIOVERSION N/A 08/24/2020   Procedure: CARDIOVERSION;  Surgeon: Jerline Pain, MD;  Location: [facility] ENDOSCOPY;  Service: Cardiovascular;  Laterality: N/A;   CARDIOVERSION N/A 04/13/2021   Procedure: CARDIOVERSION;  Surgeon:  Satira Sark, MD;  Location: [facility] ORS;  Service: Cardiovascular;  Laterality: N/A;   Septal myomectomy  06/13/2004   [facility]   TEE WITHOUT CARDIOVERSION N/A 03/29/2016   Procedure: TRANSESOPHAGEAL ECHOCARDIOGRAM (TEE);  Surgeon: Lelon Perla, MD;  Location[facility] ENDOSCOPY;  Service: Cardiovascular;  Laterality: N/A;   TEE WITHOUT CARDIOVERSION N/A 06/23/2016   Procedure: TRANSESOPHAGEAL ECHOCARDIOGRAM (TEE);  Surgeon: Thayer Headings, MD;  Location: [facility] ENDOSCOPY;  Service: Cardiovascular;  Laterality: N/A;   TRANSESOPHAGEAL ECHOCARDIOGRAM  2005   TUBAL LIGATION      Current Facility-Administered Medications  Medication Dose Route Frequency Provider Last Rate Last Admin   0.9 %  sodium chloride infusion  250 mL Intravenous PRN [name], NP       ALPRAZolam Duanne Moron) tablet 0.25 mg  0.25 mg Oral TID PRN [name], NP       [START ON 06/09/2021] aspirin EC tablet 81 mg  81 mg Oral Daily [name], NP       dabigatran (PRADAXA) capsule 150 mg  150 mg Oral Q12H [name], NP       [START ON 06/09/2021] diltiazem (CARDIZEM CD) 24 hr capsule 180 mg  180 mg Oral Daily [name], NP       dofetilide (TIKOSYN) capsule 500 mcg  500 mcg Oral BID [name], NP       HYDROcodone-acetaminophen (NORCO) 10-325 MG per tablet 1 tablet  1 tablet Oral Q4H [name], NP  hydroxypropyl methylcellulose / hypromellose (ISOPTO TEARS / GONIOVISC) 2.5 % ophthalmic solution 1 drop  1 drop Both Eyes BID PRN [name], NP       [START ON 06/09/2021] magnesium oxide (MAG-OX) tablet 200 mg  200 mg Oral Daily [name], NP       magnesium sulfate IVPB 2 g 50 mL  2 g Intravenous Once Kris Mouton, RPH       metoprolol tartrate (LOPRESSOR) tablet 25 mg  25 mg Oral BID [name], NP       [START ON 06/09/2021] pantoprazole (PROTONIX) EC tablet 40 mg  40 mg Oral Daily [name], NP       [START ON 06/09/2021] potassium chloride (KLOR-CON) CR  tablet 30 mEq  30 mEq Oral Daily [name], NP       sodium chloride flush (NS) 0.9 % injection 3 mL  3 mL Intravenous Q12H [name], NP       sodium chloride flush (NS) 0.9 % injection 3 mL  3 mL Intravenous PRN [name], NP       [START ON 06/09/2021] torsemide (DEMADEX) tablet 40 mg  40 mg Oral Daily [name], NP        Allergies  Allergen Reactions   Amiodarone Other (See Comments)    Liver function    Social History   Socioeconomic History   Marital status: Married    Spouse name: Not on file   Number of children: Not on file   Years of education: Not on file   Highest education level: Not on file  Occupational History   Occupation: Housekeeper  Tobacco Use   Smoking status: Former    Packs/day: 0.50    Types: Cigarettes    Quit date: 12/11/2020    Years since quitting: 0.4   Smokeless tobacco: Never   Tobacco comments:    6-8 cigarettes daily  Vaping Use   Vaping Use: Never used  Substance and Sexual Activity   Alcohol use: No    Alcohol/week: 0.0 standard drinks   Drug use: No   Sexual activity: Not on file  Other Topics Concern   Not on file  Social History Narrative   Not on file   Social Determinants of Health   Financial Resource Strain: Not on file  Food Insecurity: Not on file  Transportation Needs: Not on file  Physical Activity: Not on file  Stress: Not on file  Social Connections: Not on file  Intimate Partner Violence: Not on file    Family History  Problem Relation Age of Onset   Heart failure Father    Diabetes Other    Lung disease Other     ROS- All systems are reviewed and negative except as per the HPI above  Physical Exam: There were no vitals filed for this visit.  Wt Readings from Last 3 Encounters:  06/08/21 82 kg  05/10/21 79.8 kg  05/04/21 85.4 kg    Labs: Lab Results  Component Value Date   NA 139 06/08/2021   K 4.1 06/08/2021   CL 99 06/08/2021   CO2 30 06/08/2021   GLUCOSE 95  06/08/2021   BUN 18 06/08/2021   CREATININE 0.96 06/08/2021   CALCIUM 9.3 06/08/2021   MG 1.9 06/08/2021   Lab Results  Component Value Date   INR 1.7 (H) 04/09/2021   No results found for: CHOL, HDL, LDLCALC, TRIG   GEN- The patient is  well appearing, alert and oriented x 3 today.   Head- normocephalic, atraumatic Eyes-  Sclera clear, conjunctiva pink Ears- hearing intact Oropharynx- clear Neck- supple, no JVP Lymph- no cervical lymphadenopathy Lungs- Clear to ausculation bilaterally, normal work of breathing Heart- Regular rate and rhythm, no murmurs, rubs or gallops, PMI not laterally displaced GI- soft, NT, ND, + BS Extremities- no clubbing, cyanosis, or edema MS- no significant deformity or atrophy Skin- no rash or lesion Psych- euthymic mood, full affect Neuro- strength and sensation are intact  EKG-Vent. rate 46 BPM PR interval 194 ms QRS duration 114 ms QT/QTcB 472/413 ms P-R-T axes 60 37 98 Sinus bradycardia Left ventricular hypertrophy with repolarization abnormality ( Cornell product ) Cannot rule out Septal infarct , age undetermined Abnormal ECG   Assessment and Plan:  1. Afib For Tikosyn admit No benadryl  No missed anticoagulation, continue pradaxa 150 mg bid  Qtc is acceptable at 413 ms  K 4.1 Mg 1.9, will supp but OK to start this evening.   Dr. Quentin Ore to see.   [address] [place], PA-C  06/08/2021 12:38 PM  [phone]

## 2021-06-08 NOTE — Progress Notes (Signed)
Pharmacy: Dofetilide (Tikosyn) - Initial Consult Assessment and Electrolyte Replacement  Pharmacy consulted to assist in monitoring and replacing electrolytes in this 55 y.o. female admitted on 06/08/2021 undergoing dofetilide initiation.   Assessment:  Patient Exclusion Criteria: If any screening criteria checked as "Yes", then  patient  should NOT receive dofetilide until criteria item is corrected.  If Yes please indicate correction plan.  YES  NO Patient  Exclusion Criteria Correction Plan   []   [x]   Baseline QTc interval is greater than or equal to 440 msec. IF above YES box checked dofetilide contraindicated unless patient has ICD; then may proceed if QTc 500-550 msec or with known ventricular conduction abnormalities may proceed with QTc 550-600 msec. QTc = 413    []   [x]   Patient is known or suspected to have a digoxin level greater than 2 ng/ml: No results found for: DIGOXIN     []   [x]   Creatinine clearance less than 20 ml/min (calculated using Cockcroft-Gault, actual body weight and serum creatinine): Estimated Creatinine Clearance: 63.5 mL/min (by C-G formula based on SCr of 0.96 mg/dL).     []   [x]  Patient has received drugs known to prolong the QT intervals within the last 48 hours (phenothiazines, tricyclics or tetracyclic antidepressants, erythromycin, H-1 antihistamines, cisapride, fluoroquinolones, azithromycin, ondansetron).   Updated information on QT prolonging agents is available to be searched on the following database:QT prolonging agents     []   [x]   Patient received a dose of hydrochlorothiazide (Oretic) alone or in any combination including triamterene (Dyazide, Maxzide) in the last 48 hours.    []   [x]  Patient received a medication known to increase dofetilide plasma concentrations prior to initial dofetilide dose:  Trimethoprim (Primsol, Proloprim) in the last 36 hours Verapamil (Calan, Verelan) in the last 36 hours or a sustained release dose  in the last 72 hours Megestrol (Megace) in the last 5 days  Cimetidine (Tagamet) in the last 6 hours Ketoconazole (Nizoral) in the last 24 hours Itraconazole (Sporanox) in the last 48 hours  Prochlorperazine (Compazine) in the last 36 hours     []   [x]   Patient is known to have a history of torsades de pointes; congenital or acquired long QT syndromes.    []   [x]   Patient has received a Class 1 antiarrhythmic with less than 2 half-lives since last dose. (Disopyramide, Quinidine, Procainamide, Lidocaine, Mexiletine, Flecainide, Propafenone)    []   [x]   Patient has received amiodarone therapy in the past 3 months or amiodarone level is greater than 0.3 ng/ml.    Patient has been appropriately anticoagulated with pradaxa.  Labs:    Component Value Date/Time   K 4.1 06/08/2021 1128   MG 1.9 06/08/2021 1128     Plan: Potassium: K >/= 4: Appropriate to initiate Tikosyn, no replacement needed    Magnesium: Mg 1.8-2: Give Mg 2 gm IV x1 to prevent Mg from dropping below 1.8 - do not need to recheck Mg. Appropriate to initiate Tikosyn   Thank you for allowing pharmacy to participate in this patient's care   Hildred Laser, PharmD Clinical Pharmacist **Pharmacist phone directory can now be found on [email or web address] ([email or web address]).  Listed under [place].

## 2021-06-08 NOTE — Progress Notes (Signed)
Primary Care Physician: Ginger Organ Referring Physician: Dr. Renaldo Harrison is a 55 y.o. female with a h/o a h/o persistent afib, HOCM, that had ablation at [facility], in July of  2018. She has had more afib occurrence with fluid retention and is now in the afib clinic for admission for Tikosyn as she is not a good repeat ablation candidate.   She is in SR today. No benadryl use, no meds that were contraindicated with Tikosyn. No missed anticoagulation for at least 3 weeks. She was positive for   covid 12/14 so additional testing was not needed. Qtc is acceptable.   Today, she denies symptoms of palpitations, chest pain, shortness of breath, orthopnea, PND, lower extremity edema, dizziness, presyncope, syncope, or neurologic sequela. The patient is tolerating medications without difficulties and is otherwise without complaint today.   Past Medical History:  Diagnosis Date   Asthma    Cervical cancer ([place])    a. treated with partial removal of cervix   COPD (chronic obstructive pulmonary disease) ([place])    Hypertrophic cardiomyopathy ([place])    a. s/p myomectomy at [facility] 08/2004   Paroxysmal atrial flutter ([facility])    a. post-op after myomectomy in 2006. s/p TEE/DCCV.   Sleep apnea    Transient atrial fibrillation ([place]) 2006   a. after myomectomy at [facility] in 08/2004 - was on amiodarone but developed elevated LFTs felt possibly secondary to amiodarone    Past Surgical History:  Procedure Laterality Date   CARDIOVERSION N/A 08/12/2015   Procedure: CARDIOVERSION;  Surgeon: Satira Sark, MD;  Location: [facility] ENDO SUITE;  Service: Cardiovascular;  Laterality: N/A;   CARDIOVERSION N/A 08/24/2020   Procedure: CARDIOVERSION;  Surgeon: Jerline Pain, MD;  Location: [facility] ENDOSCOPY;  Service: Cardiovascular;  Laterality: N/A;   CARDIOVERSION N/A 04/13/2021   Procedure: CARDIOVERSION;  Surgeon: Satira Sark, MD;  Location: [facility] ORS;  Service: Cardiovascular;   Laterality: N/A;   Septal myomectomy  06/13/2004   [facility]   TEE WITHOUT CARDIOVERSION N/A 03/29/2016   Procedure: TRANSESOPHAGEAL ECHOCARDIOGRAM (TEE);  Surgeon: Lelon Perla, MD;  Location: [facility] ENDOSCOPY;  Service: Cardiovascular;  Laterality: N/A;   TEE WITHOUT CARDIOVERSION N/A 06/23/2016   Procedure: TRANSESOPHAGEAL ECHOCARDIOGRAM (TEE);  Surgeon: Thayer Headings, MD;  Location: [facility] ENDOSCOPY;  Service: Cardiovascular;  Laterality: N/A;   TRANSESOPHAGEAL ECHOCARDIOGRAM  2005   TUBAL LIGATION      Current Outpatient Medications  Medication Sig Dispense Refill   ALPRAZolam (XANAX) 0.25 MG tablet Take 1 tablet (0.25 mg total) by mouth 3 (three) times daily as needed for anxiety. 10 tablet 0   Ascorbic Acid (VITAMIN C PO) Take 1 tablet by mouth daily.     aspirin EC 81 MG tablet Take 81 mg by mouth daily.     Cholecalciferol (VITAMIN D3 PO) Take 1 tablet by mouth daily.     dabigatran (PRADAXA) 150 MG CAPS capsule Take 1 capsule (150 mg total) by mouth 2 (two) times daily. 60 capsule 6   diltiazem (CARDIZEM CD) 180 MG 24 hr capsule Take 180 mg by mouth daily.     diltiazem (CARDIZEM) 30 MG tablet TAKE (1) TABLET BY MOUTH EVERY FOUR HOURS AS NEEDED FOR AFIB HEART RATE OVER 100. 45 tablet 3   HYDROcodone-acetaminophen (NORCO) 10-325 MG tablet Take 1 tablet by mouth every 4 (four) hours.     hydroxypropyl methylcellulose / hypromellose (ISOPTO TEARS / GONIOVISC) 2.5 % ophthalmic solution Place 1  drop into both eyes 2 (two) times daily as needed for dry eyes.     ibuprofen (ADVIL) 800 MG tablet Take 800 mg by mouth as needed.     ipratropium (ATROVENT) 0.02 % nebulizer solution Take 0.25 mg by nebulization every 4 (four) hours as needed for wheezing or shortness of breath.     Magnesium Oxide (MAG-OXIDE) 200 MG TABS Take 1 tablet (200 mg total) by mouth daily. 90 tablet 1   metoprolol tartrate (LOPRESSOR) 25 MG tablet Take 1 tablet (25 mg total) by mouth 2 (two) times daily. 60 tablet 2    omeprazole (PRILOSEC) 40 MG capsule Take 40 mg by mouth daily.     potassium chloride (KLOR-CON) 10 MEQ tablet Take 3 tablets (30 mEq total) by mouth daily. 270 tablet 3   PROAIR HFA 108 (90 Base) MCG/ACT inhaler Inhale 2 puffs into the lungs every 6 (six) hours as needed for wheezing or shortness of breath.      torsemide (DEMADEX) 20 MG tablet Take 2 tablets (40 mg total) by mouth daily. 180 tablet 3   No current facility-administered medications for this encounter.    Allergies  Allergen Reactions   Amiodarone Other (See Comments)    Liver function    Social History   Socioeconomic History   Marital status: Married    Spouse name: Not on file   Number of children: Not on file   Years of education: Not on file   Highest education level: Not on file  Occupational History   Occupation: Housekeeper  Tobacco Use   Smoking status: Former    Packs/day: 0.50    Types: Cigarettes    Quit date: 12/11/2020    Years since quitting: 0.4   Smokeless tobacco: Never   Tobacco comments:    6-8 cigarettes daily  Vaping Use   Vaping Use: Never used  Substance and Sexual Activity   Alcohol use: No    Alcohol/week: 0.0 standard drinks   Drug use: No   Sexual activity: Not on file  Other Topics Concern   Not on file  Social History Narrative   Not on file   Social Determinants of Health   Financial Resource Strain: Not on file  Food Insecurity: Not on file  Transportation Needs: Not on file  Physical Activity: Not on file  Stress: Not on file  Social Connections: Not on file  Intimate Partner Violence: Not on file    Family History  Problem Relation Age of Onset   Heart failure Father    Diabetes Other    Lung disease Other     ROS- All systems are reviewed and negative except as per the HPI above  Physical Exam: Vitals:   06/08/21 1103  BP: (!) 108/56  Pulse: (!) 46  Weight: 82 kg  Height: 5\' 1"  (1.549 m)   Wt Readings from Last 3 Encounters:  06/08/21 82 kg   05/10/21 79.8 kg  05/04/21 85.4 kg    Labs: Lab Results  Component Value Date   NA 136 05/10/2021   K 3.8 05/10/2021   CL 92 (L) 05/10/2021   CO2 28 05/10/2021   GLUCOSE 105 (H) 05/10/2021   BUN 21 05/10/2021   CREATININE 1.05 (H) 05/10/2021   CALCIUM 9.1 05/10/2021   MG 1.8 05/10/2021   Lab Results  Component Value Date   INR 1.7 (H) 04/09/2021   No results found for: CHOL, HDL, LDLCALC, TRIG   GEN- The patient is well appearing, alert  and oriented x 3 today.   Head- normocephalic, atraumatic Eyes-  Sclera clear, conjunctiva pink Ears- hearing intact Oropharynx- clear Neck- supple, no JVP Lymph- no cervical lymphadenopathy Lungs- Clear to ausculation bilaterally, normal work of breathing Heart- Regular rate and rhythm, no murmurs, rubs or gallops, PMI not laterally displaced GI- soft, NT, ND, + BS Extremities- no clubbing, cyanosis, or edema MS- no significant deformity or atrophy Skin- no rash or lesion Psych- euthymic mood, full affect Neuro- strength and sensation are intact  EKG-Vent. rate 46 BPM PR interval 194 ms QRS duration 114 ms QT/QTcB 472/413 ms P-R-T axes 60 37 98 Sinus bradycardia Left ventricular hypertrophy with repolarization abnormality ( Cornell product ) Cannot rule out Septal infarct , age undetermined Abnormal ECG   Assessment and Plan:  1. Afib For Tikosyn admit No benadryl  No missed anticoagulation, continue pradaxa 150 mg bid  Qtc is acceptable at 413 ms  Bmet/mag pending    To 6 E 30   Leveon Pelzer C. Daisha Filosa, [facility] [address] [place], [place] [address] [phone]

## 2021-06-08 NOTE — TOC Initial Note (Addendum)
Transition of Care [facility]) - Initial/Assessment Note    Patient Details  Name: Kathleen Wright MRN: [id number] Date of Birth: 10/02/1965  Transition of Care [facility]) CM/SW Contact:    Carles Collet, RN Phone Number: 06/08/2021, 5:13 PM  Clinical Narrative:                 Patient admitted for Tikosyn initiation. From home, lives alone.  Benefit check sen to [place]. Please send DC meds through [place].  PCP- Collene Mares Cardiologist/ EP- [place] [employer]  Pharmacy for Tikosyn refills to be sent to: [facility], [place], [place] [address] [phone]    Expected Discharge Plan: Home/Self Care Barriers to Discharge: Continued Medical Work up   Patient Goals and CMS Choice        Expected Discharge Plan and Services Expected Discharge Plan: Home/Self Care   Discharge Planning Services: CM Consult, Medication Assistance                                          Prior Living Arrangements/Services                       Activities of Daily Living Home Assistive Devices/Equipment: Grab bars in shower, Hand-held shower hose, Shower chair with back ADL Screening (condition at time of admission) Patient's cognitive ability adequate to safely complete daily activities?: Yes Is the patient deaf or have difficulty hearing?: No Does the patient have difficulty seeing, even when wearing glasses/contacts?: No Does the patient have difficulty concentrating, remembering, or making decisions?: No Patient able to express need for assistance with ADLs?: No Does the patient have difficulty dressing or bathing?: No Independently performs ADLs?: Yes (appropriate for developmental age) Does the patient have difficulty walking or climbing stairs?: No Weakness of Legs: None Weakness of Arms/Hands: None  Permission Sought/Granted                  Emotional Assessment              Admission diagnosis:   A-fib [facility]) [I48.91] Patient Active Problem List   Diagnosis Date Noted   A-fib ([place]) 06/08/2021   Chronic anticoagulation 04/07/2021   DOE (dyspnea on exertion) 04/01/2021   COPD with acute exacerbation ([place]) 02/28/2021   Protein-calorie malnutrition, mild ([place]) 02/28/2021   Respiratory failure with hypoxia ([place]) 02/28/2021   Left carotid bruit 08/27/2015   COPD exacerbation ([place]) 08/10/2015   PAF (paroxysmal atrial fibrillation) ([place]) 08/10/2015   Hypertrophic obstructive cardiomyopathy ([place]) 06/10/2015   Atypical atrial flutter ([place]) 06/10/2015   History of atrial fibrillation 06/10/2015   Elevated troponin 06/10/2015   COPD GOLD ?  06/10/2015   Tobacco abuse 06/10/2015   Chest pain 06/09/2015   History of ventricular septal myectomy 06/09/2015   Palpitations 06/09/2015   H N P-LUMBAR 09/17/2008   DEGENERATIVE [place] DISEASE, LUMBAR SPINE 09/17/2008   HIGH BLOOD PRESSURE 09/17/2008   PCP:  Cory Munch, PA-C Pharmacy:   [facility], [place] [address] Phone: [phone] Fax: [phone]     Social Determinants of Health (SDOH) Interventions    Readmission Risk Interventions No flowsheet data found.

## 2021-06-09 ENCOUNTER — Other Ambulatory Visit (HOSPITAL_COMMUNITY): Payer: Self-pay

## 2021-06-09 DIAGNOSIS — I4819 Other persistent atrial fibrillation: Secondary | ICD-10-CM | POA: Diagnosis not present

## 2021-06-09 LAB — BASIC METABOLIC PANEL WITH GFR
Anion gap: 9 (ref 5–15)
BUN: 22 mg/dL — ABNORMAL HIGH (ref 6–20)
CO2: 26 mmol/L (ref 22–32)
Calcium: 8.8 mg/dL — ABNORMAL LOW (ref 8.9–10.3)
Chloride: 100 mmol/L (ref 98–111)
Creatinine, Ser: 0.92 mg/dL (ref 0.44–1.00)
GFR, Estimated: >60 mL/min
Glucose, Bld: 98 mg/dL (ref 70–99)
Potassium: 3.3 mmol/L — ABNORMAL LOW (ref 3.5–5.1)
Sodium: 135 mmol/L (ref 135–145)

## 2021-06-09 LAB — MAGNESIUM: Magnesium: 2.1 mg/dL (ref 1.7–2.4)

## 2021-06-09 MED ORDER — METOPROLOL TARTRATE 25 MG PO TABS
25.0000 mg | ORAL_TABLET | Freq: Two times a day (BID) | ORAL | Status: DC
  Administered 2021-06-09: 23:00:00 25 mg via ORAL
  Filled 2021-06-09: qty 1

## 2021-06-09 MED ORDER — METOPROLOL TARTRATE 12.5 MG HALF TABLET: 12.5000 mg | ORAL_TABLET | Freq: Two times a day (BID) | ORAL | Status: DC

## 2021-06-09 MED ORDER — DOCUSATE SODIUM 100 MG PO CAPS
100.0000 mg | ORAL_CAPSULE | Freq: Every day | ORAL | Status: DC
  Administered 2021-06-09 – 2021-06-11 (×3): 100 mg via ORAL
  Filled 2021-06-09 (×3): qty 1

## 2021-06-09 MED ORDER — METOPROLOL TARTRATE 12.5 MG HALF TABLET
12.5000 mg | ORAL_TABLET | Freq: Two times a day (BID) | ORAL | Status: DC
  Administered 2021-06-09: 11:00:00 12.5 mg via ORAL

## 2021-06-09 MED ORDER — POTASSIUM CHLORIDE CRYS ER 10 MEQ PO TBCR
40.0000 meq | EXTENDED_RELEASE_TABLET | Freq: Every day | ORAL | Status: DC
  Administered 2021-06-09 – 2021-06-11 (×3): 40 meq via ORAL
  Filled 2021-06-09 (×4): qty 4

## 2021-06-09 MED ORDER — DOFETILIDE 250 MCG PO CAPS
250.0000 ug | ORAL_CAPSULE | Freq: Two times a day (BID) | ORAL | Status: DC
  Administered 2021-06-09 – 2021-06-11 (×4): 250 ug via ORAL
  Filled 2021-06-09 (×4): qty 1

## 2021-06-09 MED ORDER — POTASSIUM CHLORIDE CRYS ER 20 MEQ PO TBCR
40.0000 meq | EXTENDED_RELEASE_TABLET | Freq: Once | ORAL | Status: AC
  Administered 2021-06-09: 08:00:00 40 meq via ORAL
  Filled 2021-06-09: qty 2

## 2021-06-09 NOTE — Progress Notes (Addendum)
Morning EKG reviewed    Shows remains in NSR at 50 bpm with stable QTc at ~480 ms when corrected for rate and QRS  Continue  Tikosyn 500 mcg BID.   Pt will not require DCCV  Pt did have 15 seconds of WCT at CL of 480 ms. Dr. Quentin Ore contacted to see if any changes needed at this time.   K was 3.3 this am and supp is ongoing.   ADDENDUM Reviewed with Dr. Quentin Ore.   Stop diltiazem  Increase lopressor as tolerated (will put back on 25 for tonight, and increase in am)  Decrease tikosyn to 250 mcg BID despite relatively stable QTc in setting of WCT.   Shirley Friar, PA-C  Pager: [phone]  06/09/2021 10:37 AM

## 2021-06-09 NOTE — Progress Notes (Signed)
Patient had 30 beats WCT. Patient stated she felt dizzy during episode.  Oda Kilts notified and aware.

## 2021-06-09 NOTE — TOC Benefit Eligibility Note (Signed)
Patient Teacher, English as a foreign language completed.    The patient is currently admitted and upon discharge could be taking dofetilide (Tikosyn) 500 mcg.  The current 30 day co-pay is, $3.95.   The patient is insured through [place], [place] Patient Advocate Specialist [place] Patient Advocate Team Direct Number: [phone]  Fax: [phone]

## 2021-06-09 NOTE — Progress Notes (Signed)
Electrophysiology Rounding Note  Patient Name: Kathleen Wright Date of Encounter: 06/09/2021  Primary Cardiologist: None  Electrophysiologist: Vickie Epley, MD    Subjective   Pt  remains in sinus brady  on Tikosyn 500 mcg BID   QTc from EKG last pm shows stable QTc at ~470-480  The patient is doing well today.  At this time, the patient denies chest pain, shortness of breath, or any new concerns.  Inpatient Medications    Scheduled Meds:  aspirin EC  81 mg Oral Daily   dabigatran  150 mg Oral Q12H   diltiazem  180 mg Oral Daily   dofetilide  500 mcg Oral BID   HYDROcodone-acetaminophen  1 tablet Oral Q4H   magnesium oxide  200 mg Oral Daily   metoprolol tartrate  25 mg Oral BID   pantoprazole  40 mg Oral Daily   potassium chloride  40 mEq Oral Daily   potassium chloride  40 mEq Oral Once   sodium chloride flush  3 mL Intravenous Q12H   torsemide  40 mg Oral Daily   Continuous Infusions:  sodium chloride 250 mL (06/08/21 1557)   PRN Meds: sodium chloride, ALPRAZolam, hydroxypropyl methylcellulose / hypromellose, sodium chloride flush   Vital Signs    Vitals:   06/08/21 1239 06/08/21 2020 06/08/21 2046 06/09/21 0544  BP: (!) 102/53 100/62  (!) 91/57  Pulse: (!) 46 (!) 55  (!) 53  Resp: 18   18  Temp: 98.1 F (36.7 C)  98.2 F (36.8 C) 98.1 F (36.7 C)  TempSrc: Oral  Oral Oral  SpO2: 95% 98%  94%  Weight: 81.5 kg     Height: 5\' 2"  (1.575 m)       Intake/Output Summary (Last 24 hours) at 06/09/2021 0755 Last data filed at 06/08/2021 1616 Gross per 24 hour  Intake 9.08 ml  Output --  Net 9.08 ml   Filed Weights   06/08/21 1239  Weight: 81.5 kg    Physical Exam    GEN- The patient is well appearing, alert and oriented x 3 today.   Head- normocephalic, atraumatic Eyes-  Sclera clear, conjunctiva pink Ears- hearing intact Oropharynx- clear Neck- supple Lungs- Clear to ausculation bilaterally, normal work of breathing Heart-  Slow but  regular  rate and rhythm, no murmurs, rubs or gallops GI- soft, NT, ND, + BS Extremities- no clubbing, cyanosis, or edema Skin- no rash or lesion Psych- euthymic mood, full affect Neuro- strength and sensation are intact  Labs    CBC No results for input(s): WBC, NEUTROABS, HGB, HCT, MCV, PLT in the last 72 hours. Basic Metabolic Panel Recent Labs    06/08/21 1128 06/09/21 0447  NA 139 135  K 4.1 3.3*  CL 99 100  CO2 30 26  GLUCOSE 95 98  BUN 18 22*  CREATININE 0.96 0.92  CALCIUM 9.3 8.8*  MG 1.9 2.1    Potassium  Date/Time Value Ref Range Status  06/09/2021 04:47 AM 3.3 (L) 3.5 - 5.1 mmol/L Final    Comment:    DELTA CHECK NOTED   Magnesium  Date/Time Value Ref Range Status  06/09/2021 04:47 AM 2.1 1.7 - 2.4 mg/dL Final    Comment:    Performed at [facility], [place] [address], [place], [place] [address]    Telemetry    Sinus bradycardia 40-50s mostly (personally reviewed)  Radiology    No results found.   Patient Profile     Kathleen Curet  Wright is a 55 y.o. female with a past medical history significant for persistent atrial fibrillation.  They were admitted for tikosyn load.   Assessment & Plan    Persistent atrial fibrillation Pt  remains in NSR/sinus brady  on Tikosyn 500 mcg BID  Continue  Pradaxa Mg stable. K 3.3. Supp aggressively.  CHA2DS2VASC is at least 4.  2. Sinus bradycardia Follow  3. Hypokalemia K 3.3. Give 40 now then 40 in 2 hours.  Mg 2.1  Patient will not require cardioversion.   For questions or updates, please contact [place] Please consult [email or web address] for contact info under Cardiology/STEMI.  Signed, Shirley Friar, PA-C  06/09/2021, 7:55 AM

## 2021-06-09 NOTE — Progress Notes (Signed)
Pharmacy: Dofetilide (Tikosyn) - Follow Up Assessment and Electrolyte Replacement  Pharmacy consulted to assist in monitoring and replacing electrolytes in this 55 y.o. female admitted on 06/08/2021 undergoing dofetilide initiation.   Labs:    Component Value Date/Time   K 3.3 (L) 06/09/2021 0447   MG 2.1 06/09/2021 0447     Plan: Potassium: K 54meq x2 per MD  Magnesium: -No supplementation needed   Thank you for allowing pharmacy to participate in this patient's care   Hildred Laser, PharmD Clinical Pharmacist **Pharmacist phone directory can now be found on [email or web address] ([email or web address]).  Listed under [place].

## 2021-06-10 DIAGNOSIS — I4819 Other persistent atrial fibrillation: Secondary | ICD-10-CM | POA: Diagnosis not present

## 2021-06-10 LAB — MAGNESIUM: Magnesium: 1.9 mg/dL (ref 1.7–2.4)

## 2021-06-10 LAB — BASIC METABOLIC PANEL
Anion gap: 9 (ref 5–15)
BUN: 25 mg/dL — ABNORMAL HIGH (ref 6–20)
CO2: 29 mmol/L (ref 22–32)
Calcium: 9.2 mg/dL (ref 8.9–10.3)
Chloride: 98 mmol/L (ref 98–111)
Creatinine, Ser: 1.29 mg/dL — ABNORMAL HIGH (ref 0.44–1.00)
GFR, Estimated: 49 mL/min — ABNORMAL LOW (ref >60)
Glucose, Bld: 128 mg/dL — ABNORMAL HIGH (ref 70–99)
Potassium: 4.1 mmol/L (ref 3.5–5.1)
Sodium: 136 mmol/L (ref 135–145)

## 2021-06-10 MED ORDER — METOPROLOL TARTRATE 50 MG PO TABS
50.0000 mg | ORAL_TABLET | Freq: Two times a day (BID) | ORAL | Status: DC
  Administered 2021-06-10 – 2021-06-11 (×3): 50 mg via ORAL
  Filled 2021-06-10 (×3): qty 1

## 2021-06-10 MED ORDER — MAGNESIUM SULFATE 2 GM/50ML IV SOLN
2.0000 g | Freq: Once | INTRAVENOUS | Status: AC
  Administered 2021-06-10: 09:00:00 2 g via INTRAVENOUS
  Filled 2021-06-10: qty 50

## 2021-06-10 NOTE — Progress Notes (Signed)
Pharmacy: Dofetilide (Tikosyn) - Follow Up Assessment and Electrolyte Replacement  Pharmacy consulted to assist in monitoring and replacing electrolytes in this 55 y.o. female admitted on 06/08/2021 undergoing dofetilide initiation.   Labs:    Component Value Date/Time   K 4.1 06/10/2021 0155   MG 1.9 06/10/2021 0155     Plan: Potassium: K >/= 4: No additional supplementation needed  Magnesium: -Mg 2gm IV ordered   As patient has required on average 40 mEq of potassium replacement every day, recommend discharging patient with prescription for:  Potassium chloride 40 mEq  daily.  She is noted on Potassium chloride 30 mEq and could consider just continuing her home dose.   Thank you for allowing pharmacy to participate in this patient's care   Hildred Laser, PharmD Clinical Pharmacist **Pharmacist phone directory can now be found on [email or web address] ([email or web address]).  Listed under [place].

## 2021-06-10 NOTE — Care Management (Signed)
1447 06-10-21 Case Manager spoke with patient regarding Tikosyn co pay cost. Patient is agreeable to cost. Patient would like the initial Rx to be sent to [facility] and the refills sent to [facility] in [place]. No further needs at this time.

## 2021-06-10 NOTE — Progress Notes (Signed)
°   06/10/21 0404  Assess: MEWS Score  Temp 98.1 F (36.7 C)  BP 100/64  Pulse Rate (!) 48  ECG Heart Rate (!) 48  Resp 16  SpO2 97 %  O2 Device Room Air  Assess: MEWS Score  MEWS Temp 0  MEWS Systolic 1  MEWS Pulse 1  MEWS RR 0  MEWS LOC 0  MEWS Score 2  MEWS Score Color Yellow  Assess: if the MEWS score is Yellow or Red  Were vital signs taken at a resting state? Yes  Focused Assessment No change from prior assessment  Early Detection of Sepsis Score *See Row Information* Low  MEWS guidelines implemented *See Row Information* Yes  Take Vital Signs  Increase Vital Sign Frequency  Yellow: Q 2hr X 2 then Q 4hr X 2, if remains yellow, continue Q 4hrs  Escalate  MEWS: Escalate Yellow: discuss with charge nurse/RN and consider discussing with provider and RRT  Notify: Charge Nurse/RN  Name of Charge Nurse/RN Notified Cejay Cambre RN  Date Charge Nurse/RN Notified 06/10/21  Time Charge Nurse/RN Notified 0404  Document  Patient Outcome Other (Comment) (pt stable remains on unit)  Progress note created (see row info) Yes

## 2021-06-10 NOTE — Progress Notes (Addendum)
Electrophysiology Rounding Note  Patient Name: Kathleen Wright Date of Encounter: 06/10/2021  Primary Cardiologist: None  Electrophysiologist: Vickie Epley, MD    Subjective   Pt  remains in sinus brady  on Tikosyn 250 mcg BID   QTc from EKG last pm shows stable QTc at ~460-470  The patient is doing well today.  At this time, the patient denies chest pain, shortness of breath, or any new concerns.  She has asked [place] to look at two knots on her legs.   Inpatient Medications    Scheduled Meds:  aspirin EC  81 mg Oral Daily   dabigatran  150 mg Oral Q12H   docusate sodium  100 mg Oral Daily   dofetilide  250 mcg Oral BID   HYDROcodone-acetaminophen  1 tablet Oral Q4H   magnesium oxide  200 mg Oral Daily   metoprolol tartrate  50 mg Oral BID   pantoprazole  40 mg Oral Daily   potassium chloride  40 mEq Oral Daily   sodium chloride flush  3 mL Intravenous Q12H   torsemide  40 mg Oral Daily   Continuous Infusions:  sodium chloride 250 mL (06/08/21 1557)   magnesium sulfate bolus IVPB     PRN Meds: sodium chloride, ALPRAZolam, hydroxypropyl methylcellulose / hypromellose, sodium chloride flush   Vital Signs    Vitals:   06/09/21 2006 06/09/21 2235 06/10/21 0404 06/10/21 0604  BP: 99/68 (!) 100/58 100/64 (!) 103/54  Pulse: (!) 53  (!) 48   Resp: 17  16   Temp: 98.1 F (36.7 C)  98.1 F (36.7 C)   TempSrc: Oral  Oral   SpO2: 94%  97%   Weight:      Height:        Intake/Output Summary (Last 24 hours) at 06/10/2021 0656 Last data filed at 06/09/2021 2000 Gross per 24 hour  Intake 240 ml  Output --  Net 240 ml   Filed Weights   06/08/21 1239  Weight: 81.5 kg    Physical Exam    GEN- The patient is well appearing, alert and oriented x 3 today.   Head- normocephalic, atraumatic Eyes-  Sclera clear, conjunctiva pink Ears- hearing intact Oropharynx- clear Neck- supple Lungs- Clear to ausculation bilaterally, normal work of breathing Heart-  Slow  but regular  rate and rhythm, no murmurs, rubs or gallops GI- soft, NT, ND, + BS Extremities- no clubbing, cyanosis, or edema Skin- no rash or lesion Psych- euthymic mood, full affect Neuro- strength and sensation are intact  Labs    CBC No results for input(s): WBC, NEUTROABS, HGB, HCT, MCV, PLT in the last 72 hours. Basic Metabolic Panel Recent Labs    06/09/21 0447 06/10/21 0155  NA 135 136  K 3.3* 4.1  CL 100 98  CO2 26 29  GLUCOSE 98 128*  BUN 22* 25*  CREATININE 0.92 1.29*  CALCIUM 8.8* 9.2  MG 2.1 1.9    Potassium  Date/Time Value Ref Range Status  06/10/2021 01:55 AM 4.1 3.5 - 5.1 mmol/L Final    Comment:    DELTA CHECK NOTED   Magnesium  Date/Time Value Ref Range Status  06/10/2021 01:55 AM 1.9 1.7 - 2.4 mg/dL Final    Comment:    Performed at [facility], [place] [address], [place], [place] [address]    Telemetry    Sinus bradycardia 40-50s (personally reviewed)  Radiology    No results found.   Patient Profile  JULITA OZBUN is a 55 y.o. female with a past medical history significant for persistent atrial fibrillation.  They were admitted for tikosyn load.   Assessment & Plan    Persistent atrial fibrillation Pt  remains in sinus brady  on Tikosyn 250 mcg BID  Continue  pradaxa K 4.1, Mg 1.9 will sup CHA2DS2VASC is at least 4  2. NSVT No further with correction of K and with decrease of tikosyn to 250 mcg BID  3. Sinus bradycardia Follow. Gently uptitrate lopressor with NSVT.   4. Knots on bilateral lower legs Just above ankle, slightly medial, bilaterally at nearly the same location.  Unclear etiology but doubt vascular. Recommend PCP f/u +/- [place] as outpatient.   Pt will not require [place]. Home tomorrow tentatively if QT remains stable.   For questions or updates, please contact [place] Please consult [email or web address] for contact info under Cardiology/STEMI.  Signed, Shirley Friar, PA-C  06/10/2021, 6:56 AM

## 2021-06-10 NOTE — Progress Notes (Addendum)
Morning EKG reviewed    Shows remains in NSR at 47 bpm with stable QTc at ~450 ms when measured manually and corrected for QRS (Not scanned in immediately due to error with machine)  Continue  Tikosyn 250 mcg BID.   Plan for home tomorrow if QTc remains stable.   Shirley Friar, PA-C  Pager: [phone]  06/10/2021 11:05 AM

## 2021-06-11 ENCOUNTER — Other Ambulatory Visit (HOSPITAL_COMMUNITY): Payer: Self-pay

## 2021-06-11 LAB — BASIC METABOLIC PANEL
Anion gap: 8 (ref 5–15)
BUN: 28 mg/dL — ABNORMAL HIGH (ref 6–20)
CO2: 29 mmol/L (ref 22–32)
Calcium: 9.1 mg/dL (ref 8.9–10.3)
Chloride: 98 mmol/L (ref 98–111)
Creatinine, Ser: 1.08 mg/dL — ABNORMAL HIGH (ref 0.44–1.00)
GFR, Estimated: >60 mL/min (ref >60)
Glucose, Bld: 99 mg/dL (ref 70–99)
Potassium: 4 mmol/L (ref 3.5–5.1)
Sodium: 135 mmol/L (ref 135–145)

## 2021-06-11 LAB — MAGNESIUM: Magnesium: 2.3 mg/dL (ref 1.7–2.4)

## 2021-06-11 MED ORDER — METOPROLOL TARTRATE 50 MG PO TABS
50.0000 mg | ORAL_TABLET | Freq: Two times a day (BID) | ORAL | 6 refills | Status: DC
  Filled 2021-06-11: qty 60, 30d supply, fill #0

## 2021-06-11 MED ORDER — DOFETILIDE 250 MCG PO CAPS
250.0000 ug | ORAL_CAPSULE | Freq: Two times a day (BID) | ORAL | 6 refills | Status: DC
  Filled 2021-06-11: qty 60, 30d supply, fill #0

## 2021-06-11 NOTE — Progress Notes (Signed)
EKG from yesterday evening 06/10/2021 reviewed    Shows remains in NSR at 58 bpm with stable QTc at ~480 ms.  Continue  Tikosyn 250 mcg BID.   Plan for home this afternoon if QTc remains stable.    K  4.0. Mg 2.3.  Full note pending disposition.  Shirley Friar, PA-C  Pager: [phone]  06/11/2021 7:01 AM

## 2021-06-11 NOTE — Discharge Summary (Signed)
ELECTROPHYSIOLOGY PROCEDURE DISCHARGE SUMMARY    Patient ID: Kathleen Wright,  MRN: [id number], DOB/AGE: 56-Feb-1967 56 y.o.  Admit date: 06/08/2021 Discharge date: 06/11/2021  Primary Care Physician: Cory Munch, PA-C  Primary Cardiologist: None  Electrophysiologist: Vickie Epley, MD   Primary Discharge Diagnosis:  1.  Persistent atrial fibrillation status post Tikosyn loading this admission  Secondary Discharge Diagnosis:  2. Sinus bradycardia 3. NSVT  Allergies  Allergen Reactions   Amiodarone Other (See Comments)    Liver function     Procedures This Admission:  1.  Tikosyn loading  Brief HPI: Kathleen Wright is a 55 y.o. female with a past medical history as noted above.  They were referred to EP in the outpatient setting for treatment options of atrial fibrillation.  Risks, benefits, and alternatives to Tikosyn were reviewed with the patient who wished to proceed.    Hospital Course:  The patient was admitted and Tikosyn was initiated.  Renal function and electrolytes were followed during the hospitalization.  Their QTc remained stable, but she did have an episode of 15 seconds of a WCT that self terminating, prompting down titration of tikosyn to 250 mcg. She presented in sinus bradycardia and did not require cardioversion. They were monitored until discharge on telemetry which demonstrated sinus bradycardia, worse at night.  On the day of discharge, they were examined by Dr. Quentin Ore  who considered them stable for discharge to home.  Follow-up has been arranged with the Atrial Fibrillation clinic in approximately 1 week and with Dr. Quentin Ore  in 4 weeks.   Physical Exam: Vitals:   06/10/21 1536 06/10/21 1956 06/11/21 0423 06/11/21 0818  BP: 117/77 100/68 (!) 107/50 (!) 103/54  Pulse: 60 61 60 66  Resp: 20 17 17    Temp: (!) 97.3 F (36.3 C) 98.5 F (36.9 C) 98.4 F (36.9 C)   TempSrc: Oral Oral Oral   SpO2: 98% 98% 98%   Weight:      Height:         GEN- The patient is well appearing, alert and oriented x 3 today.   HEENT: normocephalic, atraumatic; sclera clear, conjunctiva pink; hearing intact; oropharynx clear; neck supple, no JVP Lymph- no cervical lymphadenopathy Lungs- Clear to ausculation bilaterally, normal work of breathing.  No wheezes, rales, rhonchi Heart- Regular rate and rhythm, no murmurs, rubs or gallops, PMI not laterally displaced GI- soft, non-tender, non-distended, bowel sounds present, no hepatosplenomegaly Extremities- no clubbing, cyanosis, or edema; DP/PT/radial pulses 2+ bilaterally MS- no significant deformity or atrophy Skin- warm and dry, no rash or lesion Psych- euthymic mood, full affect Neuro- strength and sensation are intact   Labs:   Lab Results  Component Value Date   WBC 11.5 (H) 05/10/2021   HGB 10.5 (L) 05/10/2021   HCT 31.8 (L) 05/10/2021   MCV 79 05/10/2021   PLT 397 05/10/2021    Recent Labs  Lab 06/11/21 0211  NA 135  K 4.0  CL 98  CO2 29  BUN 28*  CREATININE 1.08*  CALCIUM 9.1  GLUCOSE 99     Discharge Medications:  Allergies as of 06/11/2021       Reactions   Amiodarone Other (See Comments)   Liver function        Medication List     STOP taking these medications    diltiazem 30 MG tablet Commonly known as: CARDIZEM       TAKE these medications    ALPRAZolam 0.25 MG tablet  Commonly known as: Xanax Take 1 tablet (0.25 mg total) by mouth 3 (three) times daily as needed for anxiety.   aspirin EC 81 MG tablet Take 81 mg by mouth daily.   dabigatran 150 MG Caps capsule Commonly known as: Pradaxa Take 1 capsule (150 mg total) by mouth 2 (two) times daily.   docusate sodium 100 MG capsule Commonly known as: COLACE Take 100 mg by mouth daily as needed for mild constipation.   dofetilide 250 MCG capsule Commonly known as: TIKOSYN Take 1 capsule (250 mcg total) by mouth 2 (two) times daily.   HYDROcodone-acetaminophen 10-325 MG tablet Commonly  known as: NORCO Take 1 tablet by mouth every 4 (four) hours.   hydroxypropyl methylcellulose / hypromellose 2.5 % ophthalmic solution Commonly known as: ISOPTO TEARS / GONIOVISC Place 1 drop into both eyes 2 (two) times daily as needed for dry eyes.   ipratropium 0.02 % nebulizer solution Commonly known as: ATROVENT Take 0.25 mg by nebulization every 4 (four) hours as needed for wheezing or shortness of breath.   Magnesium Oxide 200 MG Tabs Commonly known as: Mag-Oxide Take 1 tablet (200 mg total) by mouth daily.   metoprolol tartrate 50 MG tablet Commonly known as: LOPRESSOR Take 1 tablet (50 mg total) by mouth 2 (two) times daily. What changed:  medication strength how much to take   omeprazole 40 MG capsule Commonly known as: PRILOSEC Take 40 mg by mouth daily.   potassium chloride 10 MEQ tablet Commonly known as: KLOR-CON Take 3 tablets (30 mEq total) by mouth daily.   ProAir HFA 108 (90 Base) MCG/ACT inhaler Generic drug: albuterol Inhale 2 puffs into the lungs every 6 (six) hours as needed for wheezing or shortness of breath.   torsemide 20 MG tablet Commonly known as: Demadex Take 2 tablets (40 mg total) by mouth daily.   VITAMIN C PO Take 1 tablet by mouth daily.   VITAMIN D3 PO Take 1 tablet by mouth daily.        Disposition:    Follow-up Information     [facility] Follow up.   Specialty: Cardiology Why: on 1/5 at 130 pm for post tikosyn follow up Contact information: [address] [id number] [place] [phone]                Duration of Discharge Encounter: Greater than 30 minutes including physician time.  Jacalyn Lefevre, PA-C  06/11/2021 11:06 AM

## 2021-06-11 NOTE — Progress Notes (Signed)
Pharmacy: Dofetilide (Tikosyn) - Follow Up Assessment and Electrolyte Replacement  Pharmacy consulted to assist in monitoring and replacing electrolytes in this 55 y.o. female admitted on 06/08/2021 undergoing dofetilide initiation.  Labs:    Component Value Date/Time   K 4.0 06/11/2021 0211   MG 2.3 06/11/2021 0211     Plan: Potassium: K >/= 4: No additional supplementation needed  Magnesium: Mg > 2: No additional supplementation needed   As patient has required on average 40 mEq of potassium replacement every day, recommend discharging patient with prescription for:  Potassium chloride 40 mEq  daily  Thank you for allowing pharmacy to participate in this patient's care   Hildred Laser, PharmD Clinical Pharmacist **Pharmacist phone directory can now be found on [email or web address] ([email or web address]).  Listed under [place].

## 2021-06-11 NOTE — Progress Notes (Signed)
Patient given discharge instructions and stated understanding.  Patient is waiting on TOC medications.

## 2021-06-17 ENCOUNTER — Other Ambulatory Visit: Payer: Self-pay

## 2021-06-17 ENCOUNTER — Ambulatory Visit (HOSPITAL_COMMUNITY)
Admit: 2021-06-17 | Discharge: 2021-06-17 | Disposition: A | Discharge status: Home / Self Care | Payer: Medicare Other | Attending: Physician Assistant | Admitting: Physician Assistant

## 2021-06-17 VITALS — BP 106/62 | HR 58 | Ht 62.0 in | Wt 178.8 lb

## 2021-06-17 DIAGNOSIS — I5032 Chronic diastolic (congestive) heart failure: Secondary | ICD-10-CM | POA: Diagnosis not present

## 2021-06-17 DIAGNOSIS — Z7901 Long term (current) use of anticoagulants: Secondary | ICD-10-CM | POA: Diagnosis not present

## 2021-06-17 DIAGNOSIS — I480 Paroxysmal atrial fibrillation: Secondary | ICD-10-CM | POA: Diagnosis not present

## 2021-06-17 DIAGNOSIS — I421 Obstructive hypertrophic cardiomyopathy: Secondary | ICD-10-CM | POA: Diagnosis not present

## 2021-06-17 LAB — BASIC METABOLIC PANEL
Anion gap: 9 (ref 5–15)
BUN: 18 mg/dL (ref 6–20)
CO2: 32 mmol/L (ref 22–32)
Calcium: 9.4 mg/dL (ref 8.9–10.3)
Chloride: 96 mmol/L — ABNORMAL LOW (ref 98–111)
Creatinine, Ser: 0.93 mg/dL (ref 0.44–1.00)
GFR, Estimated: >60 mL/min (ref >60)
Glucose, Bld: 99 mg/dL (ref 70–99)
Potassium: 3.6 mmol/L (ref 3.5–5.1)
Sodium: 137 mmol/L (ref 135–145)

## 2021-06-17 LAB — MAGNESIUM: Magnesium: 2 mg/dL (ref 1.7–2.4)

## 2021-06-17 MED ORDER — DOFETILIDE 250 MCG PO CAPS: 250.0000 ug | ORAL_CAPSULE | Freq: Two times a day (BID) | ORAL | 6 refills | Status: DC

## 2021-06-17 NOTE — Progress Notes (Signed)
Primary Care Physician: Ginger Organ Referring Physician: Dr. Renaldo Harrison is a 56 y.o. female with a h/o a h/o persistent afib, HOCM, that had ablation at [facility], in July of  2018. She has had more afib occurrence with fluid retention and is now in the afib clinic for admission for Tikosyn as she is not a good repeat ablation candidate.   On follow up today, patient is s/p dofetilide loading 12/27-12/30/22. She presented in SR and did not require DCCV. Her dose had to be down titrated due to a short episode of WCT. She is doing well today with no heart racing or palpitations.   Today, she denies symptoms of palpitations, chest pain, shortness of breath, orthopnea, PND, lower extremity edema, dizziness, presyncope, syncope, or neurologic sequela. The patient is tolerating medications without difficulties and is otherwise without complaint today.   Past Medical History:  Diagnosis Date   Asthma    Cervical cancer ([place])    a. treated with partial removal of cervix   COPD (chronic obstructive pulmonary disease) ([place])    Hypertrophic cardiomyopathy ([place])    a. s/p myomectomy at [facility] 08/2004   Paroxysmal atrial flutter ([facility])    a. post-op after myomectomy in 2006. s/p TEE/DCCV.   Sleep apnea    Transient atrial fibrillation ([place]) 2006   a. after myomectomy at [facility] in 08/2004 - was on amiodarone but developed elevated LFTs felt possibly secondary to amiodarone    Past Surgical History:  Procedure Laterality Date   CARDIOVERSION N/A 08/12/2015   Procedure: CARDIOVERSION;  Surgeon: Satira Sark, MD;  Location: [facility] ENDO SUITE;  Service: Cardiovascular;  Laterality: N/A;   CARDIOVERSION N/A 08/24/2020   Procedure: CARDIOVERSION;  Surgeon: Jerline Pain, MD;  Location: [facility] ENDOSCOPY;  Service: Cardiovascular;  Laterality: N/A;   CARDIOVERSION N/A 04/13/2021   Procedure: CARDIOVERSION;  Surgeon: Satira Sark, MD;  Location: [facility] ORS;  Service:  Cardiovascular;  Laterality: N/A;   Septal myomectomy  06/13/2004   [facility]   TEE WITHOUT CARDIOVERSION N/A 03/29/2016   Procedure: TRANSESOPHAGEAL ECHOCARDIOGRAM (TEE);  Surgeon: Lelon Perla, MD;  Location: [facility] ENDOSCOPY;  Service: Cardiovascular;  Laterality: N/A;   TEE WITHOUT CARDIOVERSION N/A 06/23/2016   Procedure: TRANSESOPHAGEAL ECHOCARDIOGRAM (TEE);  Surgeon: Thayer Headings, MD;  Location[facility] ENDOSCOPY;  Service: Cardiovascular;  Laterality: N/A;   TRANSESOPHAGEAL ECHOCARDIOGRAM  2005   TUBAL LIGATION      Current Outpatient Medications  Medication Sig Dispense Refill   ALPRAZolam (XANAX) 0.25 MG tablet Take 1 tablet (0.25 mg total) by mouth 3 (three) times daily as needed for anxiety. 10 tablet 0   Ascorbic Acid (VITAMIN C PO) Take 1 tablet by mouth daily.     aspirin EC 81 MG tablet Take 81 mg by mouth daily.     Cholecalciferol (VITAMIN D3 PO) Take 1 tablet by mouth daily.     dabigatran (PRADAXA) 150 MG CAPS capsule Take 1 capsule (150 mg total) by mouth 2 (two) times daily. 60 capsule 6   docusate sodium (COLACE) 100 MG capsule Take 100 mg by mouth daily as needed for mild constipation.     dofetilide (TIKOSYN) 250 MCG capsule Take 1 capsule (250 mcg total) by mouth 2 (two) times daily. 60 capsule 6   HYDROcodone-acetaminophen (NORCO) 10-325 MG tablet Take 1 tablet by mouth every 4 (four) hours.     hydroxypropyl methylcellulose / hypromellose (ISOPTO TEARS / GONIOVISC) 2.5 % ophthalmic  solution Place 1 drop into both eyes 2 (two) times daily as needed for dry eyes.     ipratropium (ATROVENT) 0.02 % nebulizer solution Take 0.25 mg by nebulization every 4 (four) hours as needed for wheezing or shortness of breath.     Magnesium Oxide (MAG-OXIDE) 200 MG TABS Take 1 tablet (200 mg total) by mouth daily. (Patient taking differently: Take 250 mg by mouth daily. Taking 250mg  by mouth daily) 90 tablet 1   metoprolol tartrate (LOPRESSOR) 50 MG tablet Take 1 tablet (50 mg total) by  mouth 2 (two) times daily. 60 tablet 6   omeprazole (PRILOSEC) 40 MG capsule Take 40 mg by mouth daily.     potassium chloride (KLOR-CON) 10 MEQ tablet Take 3 tablets (30 mEq total) by mouth daily. 270 tablet 3   PROAIR HFA 108 (90 Base) MCG/ACT inhaler Inhale 2 puffs into the lungs every 6 (six) hours as needed for wheezing or shortness of breath.      torsemide (DEMADEX) 20 MG tablet Take 2 tablets (40 mg total) by mouth daily. 180 tablet 3   No current facility-administered medications for this encounter.    Allergies  Allergen Reactions   Amiodarone Other (See Comments)    Liver function    Social History   Socioeconomic History   Marital status: Married    Spouse name: Not on file   Number of children: Not on file   Years of education: Not on file   Highest education level: Not on file  Occupational History   Occupation: Housekeeper  Tobacco Use   Smoking status: Former    Packs/day: 0.50    Types: Cigarettes    Quit date: 12/11/2020    Years since quitting: 0.5   Smokeless tobacco: Never   Tobacco comments:    6-8 cigarettes daily  Vaping Use   Vaping Use: Never used  Substance and Sexual Activity   Alcohol use: No    Alcohol/week: 0.0 standard drinks   Drug use: No   Sexual activity: Not on file  Other Topics Concern   Not on file  Social History Narrative   Not on file   Social Determinants of Health   Financial Resource Strain: Not on file  Food Insecurity: Not on file  Transportation Needs: Not on file  Physical Activity: Not on file  Stress: Not on file  Social Connections: Not on file  Intimate Partner Violence: Not on file    Family History  Problem Relation Age of Onset   Heart failure Father    Diabetes Other    Lung disease Other     ROS- All systems are reviewed and negative except as per the HPI above  Physical Exam: Vitals:   06/17/21 1330  BP: 106/62  Pulse: (!) 58  Weight: 81.1 kg  Height: 5\' 2"  (1.575 m)    Wt Readings  from Last 3 Encounters:  06/17/21 81.1 kg  06/08/21 81.5 kg  06/08/21 82 kg    Labs: Lab Results  Component Value Date   NA 135 06/11/2021   K 4.0 06/11/2021   CL 98 06/11/2021   CO2 29 06/11/2021   GLUCOSE 99 06/11/2021   BUN 28 (H) 06/11/2021   CREATININE 1.08 (H) 06/11/2021   CALCIUM 9.1 06/11/2021   MG 2.3 06/11/2021   Lab Results  Component Value Date   INR 1.7 (H) 04/09/2021   No results found for: CHOL, HDL, LDLCALC, TRIG  GEN- The patient is a well appearing obese  female, alert and oriented x 3 today.   HEENT-head normocephalic, atraumatic, sclera clear, conjunctiva pink, hearing intact, trachea midline. Lungs- Clear to ausculation bilaterally, normal work of breathing Heart- slightly irregular rate and rhythm, no murmurs, rubs or gallops  GI- soft, NT, ND, + BS Extremities- no clubbing, cyanosis, or edema MS- no significant deformity or atrophy Skin- no rash or lesion Psych- euthymic mood, full affect Neuro- strength and sensation are intact   EKG- SB, 1st degree AV block, PACs Vent. rate 58 BPM PR interval 210 ms QRS duration 118 ms QT/QTcB 512/502 ms (~475 ms when measured manually)   Assessment and Plan:  1. Paroxysmal Afib S/p dofetilide loading 12/27-12/30/22 Patient appears to be maintaining SR. Continue dofetilide 250 mcg BID. QT stable.  Check bmet/mag today. Continue pradaxa 150 mg BID  2. HOCM S/p myomectomy  3. Chronic HFpEF No signs or symptoms of fluid overload.    Follow up with Dr Quentin Ore as scheduled.    [facility] [address] [place], [place] [address] [phone]

## 2021-06-18 ENCOUNTER — Other Ambulatory Visit (HOSPITAL_COMMUNITY): Payer: Self-pay | Admitting: *Deleted

## 2021-06-18 MED ORDER — POTASSIUM CHLORIDE ER 10 MEQ PO TBCR: 40.0000 meq | EXTENDED_RELEASE_TABLET | Freq: Every day | ORAL | 3 refills | Status: DC

## 2021-06-21 ENCOUNTER — Ambulatory Visit: Payer: Medicare Other | Admitting: Internal Medicine

## 2021-06-22 DIAGNOSIS — J449 Chronic obstructive pulmonary disease, unspecified: Secondary | ICD-10-CM | POA: Diagnosis not present

## 2021-07-07 DIAGNOSIS — M47816 Spondylosis without myelopathy or radiculopathy, lumbar region: Secondary | ICD-10-CM | POA: Diagnosis not present

## 2021-07-07 DIAGNOSIS — I872 Venous insufficiency (chronic) (peripheral): Secondary | ICD-10-CM | POA: Diagnosis not present

## 2021-07-07 DIAGNOSIS — I809 Phlebitis and thrombophlebitis of unspecified site: Secondary | ICD-10-CM | POA: Diagnosis not present

## 2021-07-07 DIAGNOSIS — M1991 Primary osteoarthritis, unspecified site: Secondary | ICD-10-CM | POA: Diagnosis not present

## 2021-07-07 DIAGNOSIS — G894 Chronic pain syndrome: Secondary | ICD-10-CM | POA: Diagnosis not present

## 2021-07-20 ENCOUNTER — Encounter: Payer: Self-pay | Admitting: Cardiology

## 2021-07-20 ENCOUNTER — Other Ambulatory Visit: Payer: Self-pay

## 2021-07-20 ENCOUNTER — Ambulatory Visit: Payer: Medicare Other | Admitting: Cardiology

## 2021-07-20 VITALS — BP 110/62 | HR 57 | Ht 61.0 in | Wt 185.2 lb

## 2021-07-20 DIAGNOSIS — I421 Obstructive hypertrophic cardiomyopathy: Secondary | ICD-10-CM

## 2021-07-20 DIAGNOSIS — I480 Paroxysmal atrial fibrillation: Secondary | ICD-10-CM

## 2021-07-20 DIAGNOSIS — Z79899 Other long term (current) drug therapy: Secondary | ICD-10-CM

## 2021-07-20 MED ORDER — POTASSIUM CHLORIDE ER 10 MEQ PO TBCR: 40.0000 meq | EXTENDED_RELEASE_TABLET | Freq: Every day | ORAL | 3 refills | Status: DC

## 2021-07-20 NOTE — Patient Instructions (Addendum)
Medication Instructions:  Your physician recommends that you continue on your current medications as directed. Please refer to the Current Medication list given to you today. *If you need a refill on your cardiac medications before your next appointment, please call your pharmacy*  Lab Work: None. If you have labs (blood work) drawn today and your tests are completely normal, you will receive your results only by: [place] (if you have MyChart) OR A paper copy in the mail If you have any lab test that is abnormal or we need to change your treatment, we will call you to review the results.  Testing/Procedures: None.  Follow-Up: At [facility], you and your health needs are our priority.  As part of our continuing mission to provide you with exceptional heart care, we have created designated Provider Care Teams.  These Care Teams include your primary Cardiologist (physician) and Advanced Practice Providers (APPs -  Physician Assistants and Nurse Practitioners) who all work together to provide you with the care you need, when you need it.  Your physician wants you to follow-up in: 4 months with  or one of the following Advanced Practice Providers on your designated Care Team:    Tommye Standard, PA-C Legrand Como "Jonni Sanger" [place], [place]   You will receive a reminder letter in the mail two months in advance. If you don't receive a letter, please call our office to schedule the follow-up appointment.  We recommend signing up for the patient portal called "MyChart".  Sign up information is provided on this After Visit Summary.  MyChart is used to connect with patients for Virtual Visits (Telemedicine).  Patients are able to view lab/test results, encounter notes, upcoming appointments, etc.  Non-urgent messages can be sent to your provider as well.   To learn more about what you can do with MyChart, go to [email or web address].    Any Other Special Instructions Will Be Listed Below (If  Applicable).

## 2021-07-20 NOTE — Progress Notes (Signed)
Electrophysiology Office Follow up Visit Note:    Date:  07/20/2021   ID:  Kathleen Wright, DOB 1965/12/09, MRN [id number]  PCP:  Ginger Organ  [facility] Cardiologist:  None  [facility] Electrophysiologist:  Vickie Epley, MD    Interval History:    Kathleen Wright is a 56 y.o. female who presents for a follow up visit. They were last seen in clinic 05/04/2021.  Kathleen Wright is s/p Tikosyn loading 06/08/2021.  Since their last appointment, she has been feeling pretty good. She denies any recurrent arrhythmias.  Her potassium supplement was increased to 40 mEQ daily.  No bleeding issues on anticoagulation. Remains compliant.  She denies any palpitations, chest pain, or shortness of breath. No lightheadedness, headaches, syncope, orthopnea, PND, lower extremity edema or exertional symptoms.        Past Medical History:  Diagnosis Date   Asthma    Cervical cancer ([place])    a. treated with partial removal of cervix   COPD (chronic obstructive pulmonary disease) ([place])    Hypertrophic cardiomyopathy ([place])    a. s/p myomectomy at [facility] 08/2004   Paroxysmal atrial flutter ([facility])    a. post-op after myomectomy in 2006. s/p TEE/DCCV.   Sleep apnea    Transient atrial fibrillation ([place]) 2006   a. after myomectomy at [facility] in 08/2004 - was on amiodarone but developed elevated LFTs felt possibly secondary to amiodarone     Past Surgical History:  Procedure Laterality Date   CARDIOVERSION N/A 08/12/2015   Procedure: CARDIOVERSION;  Surgeon: Satira Sark, MD;  Location: [facility] ENDO SUITE;  Service: Cardiovascular;  Laterality: N/A;   CARDIOVERSION N/A 08/24/2020   Procedure: CARDIOVERSION;  Surgeon: Jerline Pain, MD;  Location: [facility] ENDOSCOPY;  Service: Cardiovascular;  Laterality: N/A;   CARDIOVERSION N/A 04/13/2021   Procedure: CARDIOVERSION;  Surgeon: Satira Sark, MD;  Location: [facility] ORS;  Service: Cardiovascular;  Laterality: N/A;   Septal myomectomy   06/13/2004   [facility]   TEE WITHOUT CARDIOVERSION N/A 03/29/2016   Procedure: TRANSESOPHAGEAL ECHOCARDIOGRAM (TEE);  Surgeon: Lelon Perla, MD;  Location: [facility] ENDOSCOPY;  Service: Cardiovascular;  Laterality: N/A;   TEE WITHOUT CARDIOVERSION N/A 06/23/2016   Procedure: TRANSESOPHAGEAL ECHOCARDIOGRAM (TEE);  Surgeon: Thayer Headings, MD;  Location: [facility] ENDOSCOPY;  Service: Cardiovascular;  Laterality: N/A;   TRANSESOPHAGEAL ECHOCARDIOGRAM  2005   TUBAL LIGATION      Current Medications: Current Meds  Medication Sig   ALPRAZolam (XANAX) 0.25 MG tablet Take 1 tablet (0.25 mg total) by mouth 3 (three) times daily as needed for anxiety.   Ascorbic Acid (VITAMIN C PO) Take 1 tablet by mouth daily.   aspirin EC 81 MG tablet Take 81 mg by mouth daily.   Cholecalciferol (VITAMIN D3 PO) Take 1 tablet by mouth daily.   dabigatran (PRADAXA) 150 MG CAPS capsule Take 1 capsule (150 mg total) by mouth 2 (two) times daily.   docusate sodium (COLACE) 100 MG capsule Take 100 mg by mouth daily as needed for mild constipation.   dofetilide (TIKOSYN) 250 MCG capsule Take 1 capsule (250 mcg total) by mouth 2 (two) times daily.   HYDROcodone-acetaminophen (NORCO) 10-325 MG tablet Take 1 tablet by mouth every 4 (four) hours.   hydroxypropyl methylcellulose / hypromellose (ISOPTO TEARS / GONIOVISC) 2.5 % ophthalmic solution Place 1 drop into both eyes 2 (two) times daily as needed for dry eyes.   ipratropium (ATROVENT) 0.02 % nebulizer solution Take 0.25 mg by nebulization  every 4 (four) hours as needed for wheezing or shortness of breath.   Magnesium Oxide (MAG-OXIDE) 200 MG TABS Take 1 tablet (200 mg total) by mouth daily. (Patient taking differently: Take 250 mg by mouth daily. Taking 250mg  by mouth daily)   metoprolol tartrate (LOPRESSOR) 50 MG tablet Take 1 tablet (50 mg total) by mouth 2 (two) times daily.   omeprazole (PRILOSEC) 40 MG capsule Take 40 mg by mouth daily.   PROAIR HFA 108 (90 Base) MCG/ACT  inhaler Inhale 2 puffs into the lungs every 6 (six) hours as needed for wheezing or shortness of breath.    torsemide (DEMADEX) 20 MG tablet Take 2 tablets (40 mg total) by mouth daily.   [DISCONTINUED] potassium chloride (KLOR-CON) 10 MEQ tablet Take 4 tablets (40 mEq total) by mouth daily.     Allergies:   Amiodarone   Social History   Socioeconomic History   Marital status: Widowed    Spouse name: Not on file   Number of children: Not on file   Years of education: Not on file   Highest education level: Not on file  Occupational History   Occupation: Housekeeper  Tobacco Use   Smoking status: Former    Packs/day: 0.50    Types: Cigarettes    Quit date: 12/11/2020    Years since quitting: 0.6   Smokeless tobacco: Never   Tobacco comments:    6-8 cigarettes daily  Vaping Use   Vaping Use: Never used  Substance and Sexual Activity   Alcohol use: No    Alcohol/week: 0.0 standard drinks   Drug use: No   Sexual activity: Not on file  Other Topics Concern   Not on file  Social History Narrative   Not on file   Social Determinants of Health   Financial Resource Strain: Not on file  Food Insecurity: Not on file  Transportation Needs: Not on file  Physical Activity: Not on file  Stress: Not on file  Social Connections: Not on file     Family History: The patient's family history includes Diabetes in an other family member; Heart failure in her father; Lung disease in an other family member.  ROS:   Please see the history of present illness.    All other systems reviewed and are negative.  EKGs/Labs/Other Studies Reviewed:    The following studies were reviewed today:  02/28/2021 Echo:  1. The left ventricle has normal systolic function with an ejection  fraction of 60-65%. The cavity size was normal. Left ventricular diastolic  parameters were normal.   2. The right ventricle has normal systolic function. The cavity was  normal. There is no increase in right  ventricular wall thickness.   3. Left atrial size was moderately dilated.   4. Right atrial size was mildly dilated.   5. The mitral valve is degenerative. Moderate thickening of the mitral  valve leaflet. Moderate calcification of the mitral valve leaflet. There  is moderate mitral annular calcification present. Mitral valve  regurgitation is moderate by color flow  Doppler.   6. MR is poorly characterized but appears moderate in some apical views.   7. The aortic valve is tricuspid. Moderate thickening of the aortic valve. Sclerosis without any evidence of stenosis of the aortic valve.  02/27/2021 CTA Chest: FINDINGS: Cardiovascular: Good opacification of the central and segmental pulmonary arteries. No focal filling defects are demonstrated. No evidence of significant pulmonary embolus. Cardiac enlargement with particular prominence of the right heart and left atrium.  No pericardial effusions. Coronary artery and mild aortic calcification. Normal caliber thoracic aorta. No evidence of aortic dissection. Great vessel origins are patent.   Mediastinum/Nodes: Thyroid gland is unremarkable. Moderate prominence of mediastinal lymph nodes without pathologic enlargement. Esophagus is decompressed.   Lungs/Pleura: Motion artifact limits evaluation. There is bronchial wall thickening with peribronchial nodular airspace infiltrates most prominent in the perihilar regions bilaterally. Changes are most likely to represent multifocal pneumonia, bronchopneumonia, or other airways disease. No pleural effusions. No pneumothorax.   Upper Abdomen: Reflux of contrast material into the hepatic veins may indicate right heart failure.   Musculoskeletal: Postoperative changes in the mediastinum with sternotomy wires present. Degenerative changes in the spine.   Review of the MIP images confirms the above findings.   IMPRESSION: 1. No evidence of significant pulmonary embolus. 2. Cardiac  enlargement with evidence of right heart failure. 3. Bronchial wall thickening with peribronchial nodular airspace infiltrates likely representing multifocal pneumonia or airways disease.  11/2018 Monitor: Sinus rhythm with interventricular conduction delay Average HR 59 bpm (rate 36-184 bpm) Nonsustained ventricular tachycardia Nonsustained atrial tachycardia No sustained arrhythmias No afib  EKG:   07/20/2021: Sinus rhythm.  QTc 465 ms  Recent Labs: 02/27/2021: B Natriuretic Peptide 610.0 02/28/2021: ALT 16 04/01/2021: Pro B Natriuretic peptide (BNP) 710.0; TSH 2.66 05/10/2021: Hemoglobin 10.5; Platelets 397 06/17/2021: BUN 18; Creatinine, Ser 0.93; Magnesium 2.0; Potassium 3.6; Sodium 137  Recent Lipid Panel No results found for: CHOL, TRIG, HDL, CHOLHDL, VLDL, LDLCALC, LDLDIRECT  Physical Exam:    VS:  BP 110/62    Pulse (!) 57    Ht 5\' 1"  (1.549 m)    Wt 185 lb 3.2 oz (84 kg)    SpO2 97%    BMI 34.99 kg/m     Wt Readings from Last 3 Encounters:  07/20/21 185 lb 3.2 oz (84 kg)  06/17/21 178 lb 12.8 oz (81.1 kg)  06/08/21 179 lb 11.2 oz (81.5 kg)     GEN: Well nourished, well developed in no acute distress HEENT: Normal NECK: No JVD; No carotid bruits LYMPHATICS: No lymphadenopathy CARDIAC: RRR, no murmurs, rubs, gallops RESPIRATORY:  Clear to auscultation without rales, wheezing or rhonchi  ABDOMEN: Soft, non-tender, non-distended MUSCULOSKELETAL:  No edema; No deformity  SKIN: Warm and dry NEUROLOGIC:  Alert and oriented x 3 PSYCHIATRIC:  Normal affect        ASSESSMENT:    1. PAF (paroxysmal atrial fibrillation) ([place])   2. HOCM (hypertrophic obstructive cardiomyopathy) ([place])   3. Encounter for long-term (current) use of high-risk medication    PLAN:    In order of problems listed above:  #atrial fibrillation Maintaining sinus rhythm on Tikosyn.  Continue 250 mcg by mouth twice daily.  QTc acceptable today.  We will plan to see her back in 4 months with an  APP.  Blood work at that visit.  Continue Pradaxa for stroke prophylaxis.  We will refill potassium supplements today.  #HOCM No obstructive symptoms today.  She is post myectomy at [facility].   Follow-up in 4 months with APP.   Medication Adjustments/Labs and Tests Ordered: Current medicines are reviewed at length with the patient today.  Concerns regarding medicines are outlined above.   Orders Placed This Encounter  Procedures   EKG 12-Lead   Meds ordered this encounter  Medications   potassium chloride (KLOR-CON) 10 MEQ tablet    Sig: Take 4 tablets (40 mEq total) by mouth daily.    Dispense:  360 tablet  Refill:  3   I,Mathew Stumpf,acting as a scribe for Vickie Epley, MD.,have documented all relevant documentation on the behalf of Vickie Epley, MD,as directed by  Vickie Epley, MD while in the presence of Vickie Epley, MD.  I, Vickie Epley, MD, have reviewed all documentation for this visit. The documentation on 07/20/21 for the exam, diagnosis, procedures, and orders are all accurate and complete.   Signed, Lars Mage, MD, [facility], [facility] 07/20/2021 3:00 PM    Electrophysiology [place]

## 2021-07-29 ENCOUNTER — Ambulatory Visit: Payer: Medicare Other

## 2021-07-29 ENCOUNTER — Other Ambulatory Visit: Payer: Self-pay

## 2021-07-29 DIAGNOSIS — G4733 Obstructive sleep apnea (adult) (pediatric): Secondary | ICD-10-CM

## 2021-07-29 DIAGNOSIS — R0683 Snoring: Secondary | ICD-10-CM

## 2021-08-02 DIAGNOSIS — M47816 Spondylosis without myelopathy or radiculopathy, lumbar region: Secondary | ICD-10-CM | POA: Diagnosis not present

## 2021-08-02 DIAGNOSIS — G894 Chronic pain syndrome: Secondary | ICD-10-CM | POA: Diagnosis not present

## 2021-08-02 DIAGNOSIS — M1991 Primary osteoarthritis, unspecified site: Secondary | ICD-10-CM | POA: Diagnosis not present

## 2021-08-05 ENCOUNTER — Telehealth: Payer: Self-pay | Admitting: Pulmonary Disease

## 2021-08-05 DIAGNOSIS — G4733 Obstructive sleep apnea (adult) (pediatric): Secondary | ICD-10-CM | POA: Diagnosis not present

## 2021-08-05 NOTE — Telephone Encounter (Signed)
HST 07/29/21 >> AHI 6.1, SpO2 low 77%   Please inform her that her sleep study shows mild obstructive sleep apnea.  Please arrange for ROV with me or NP to discuss treatment options.

## 2021-08-05 NOTE — Telephone Encounter (Signed)
Called and spoke to patient regarding HST results. She voiced understanding and made an appt for [place] office. Will call patient if any openings for RDS come up next week. Nothing further needed.

## 2021-08-10 ENCOUNTER — Encounter (HOSPITAL_COMMUNITY): Payer: Medicare Other

## 2021-08-10 ENCOUNTER — Ambulatory Visit (HOSPITAL_COMMUNITY)
Admission: RE | Admit: 2021-08-10 | Discharge: 2021-08-10 | Disposition: A | Discharge status: Home / Self Care | Payer: Medicare Other | Source: Ambulatory Visit | Attending: Internal Medicine | Admitting: Internal Medicine

## 2021-08-10 ENCOUNTER — Other Ambulatory Visit: Payer: Self-pay

## 2021-08-10 DIAGNOSIS — J449 Chronic obstructive pulmonary disease, unspecified: Secondary | ICD-10-CM | POA: Diagnosis not present

## 2021-08-10 DIAGNOSIS — R0609 Other forms of dyspnea: Secondary | ICD-10-CM | POA: Diagnosis not present

## 2021-08-10 LAB — PULMONARY FUNCTION TEST
DL/VA % pred: 64 %
DL/VA: 2.8 ml/min/mmHg/L
DLCO unc % pred: 46 %
DLCO unc: 8.59 ml/min/mmHg
FEF 25-75 Post: 0.46 L/sec
FEF 25-75 Pre: 0.34 L/sec
FEF2575-%Change-Post: 34 %
FEF2575-%Pred-Post: 19 %
FEF2575-%Pred-Pre: 14 %
FEV1-%Change-Post: 10 %
FEV1-%Pred-Post: 36 %
FEV1-%Pred-Pre: 33 %
FEV1-Post: 0.87 L
FEV1-Pre: 0.79 L
FEV1FVC-%Change-Post: 0 %
FEV1FVC-%Pred-Pre: 67 %
FEV6-%Change-Post: 13 %
FEV6-%Pred-Post: 54 %
FEV6-%Pred-Pre: 47 %
FEV6-Post: 1.6 L
FEV6-Pre: 1.41 L
FEV6FVC-%Change-Post: 3 %
FEV6FVC-%Pred-Post: 100 %
FEV6FVC-%Pred-Pre: 97 %
FVC-%Change-Post: 9 %
FVC-%Pred-Post: 53 %
FVC-%Pred-Pre: 48 %
FVC-Post: 1.64 L
FVC-Pre: 1.49 L
Post FEV1/FVC ratio: 53 %
Post FEV6/FVC ratio: 98 %
Pre FEV1/FVC ratio: 53 %
Pre FEV6/FVC Ratio: 95 %
RV % pred: 187 %
RV: 3.29 L
TLC % pred: 108 %
TLC: 4.98 L

## 2021-08-10 MED ORDER — ALBUTEROL SULFATE (2.5 MG/3ML) 0.083% IN NEBU
2.5000 mg | INHALATION_SOLUTION | Freq: Once | RESPIRATORY_TRACT | Status: AC
  Administered 2021-08-10: 2.5 mg via RESPIRATORY_TRACT

## 2021-08-11 ENCOUNTER — Ambulatory Visit: Payer: Medicare Other | Admitting: Nurse Practitioner

## 2021-08-11 ENCOUNTER — Encounter: Payer: Self-pay | Admitting: Nurse Practitioner

## 2021-08-11 VITALS — BP 118/60 | HR 56 | Ht 61.0 in | Wt 180.8 lb

## 2021-08-11 DIAGNOSIS — Z720 Tobacco use: Secondary | ICD-10-CM

## 2021-08-11 DIAGNOSIS — J449 Chronic obstructive pulmonary disease, unspecified: Secondary | ICD-10-CM | POA: Diagnosis not present

## 2021-08-11 DIAGNOSIS — J9691 Respiratory failure, unspecified with hypoxia: Secondary | ICD-10-CM

## 2021-08-11 DIAGNOSIS — G4733 Obstructive sleep apnea (adult) (pediatric): Secondary | ICD-10-CM | POA: Diagnosis not present

## 2021-08-11 DIAGNOSIS — I480 Paroxysmal atrial fibrillation: Secondary | ICD-10-CM | POA: Diagnosis not present

## 2021-08-11 MED ORDER — STIOLTO RESPIMAT 2.5-2.5 MCG/ACT IN AERS
2.0000 | INHALATION_SPRAY | Freq: Every day | RESPIRATORY_TRACT | 5 refills | Status: DC
Start: 2021-08-11 — End: 2022-06-03

## 2021-08-11 MED ORDER — STIOLTO RESPIMAT 2.5-2.5 MCG/ACT IN AERS: 2.0000 | INHALATION_SPRAY | Freq: Every day | RESPIRATORY_TRACT | 0 refills | Status: DC

## 2021-08-11 MED ORDER — ALBUTEROL SULFATE (2.5 MG/3ML) 0.083% IN NEBU: 2.5000 mg | INHALATION_SOLUTION | Freq: Four times a day (QID) | RESPIRATORY_TRACT | 5 refills | Status: DC | PRN

## 2021-08-11 NOTE — Progress Notes (Signed)
Reviewed and agree with assessment/plan. ? ? ?Chesley Mires, MD ?[place] ?08/11/2021, 3:27 PM ?Pager:  [phone] ? ?

## 2021-08-11 NOTE — Progress Notes (Signed)
@Patient  ID: Kathleen Wright, female    DOB: 12-05-1965, 56 y.o.   MRN: [id number]  Chief Complaint  Patient presents with   Follow-up    HST results    Referring provider: Ginger Organ  HPI: 56 year old female, former smoker followed for mild obstructive sleep apnea and COPD. She is a patient of Dr. Gustavus Bryant and followed by Dr. Halford Chessman for sleep. Most recent office visit was 04/28/2021. Past medical history significant for PAF, hypertrophic cardiomyopathy, HTN, DDD. She is followed by cardiology.   TEST/EVENTS:  02/28/2021 echocardiogram: EF 65 to 70%.  Moderate LVH.  G3 DD.  RV function low normal and mildly enlarged.  Mildly elevated PASP (43.4 mmHg).  LA severely dilated and RA moderately dilated.  Moderate pericardial effusion without tamponade.  Moderate MR.  Mild to moderate TR. 07/29/2021 HST: AHI 6.1/h, SPO2 low 77%.  Mild obstructive sleep apnea. 08/10/2021 PFTs: FVC 1.64 (53), FEV1 0.87 (36), ratio 53, TLC 108%, DLCOunc 46%.  Severe obstructive sleep apnea with increased lung volumes and severe diffusion defect.  Borderline bronchodilator response (10%)  04/28/2021: OV with Dr. Halford Chessman.  Seen for sleep consult.  Previously in the hospital in September for A-fib and COPD exacerbation with hypoxia and hypercapnia.  Improved after starting on oxygen and BiPAP.  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  Seen by Dr. Melvyn Novas after discharge and started on [facility] for COPD; however reported this made her breathing worse and noticed she was coughing more so she stopped it.  PFTs were ordered at former visit.  Noted that she snores at night, naps frequently during the day and has daytime fatigue symptoms.  Walking oximetry on room air with SPO2 low of 92%.  HST ordered  08/11/2021: Today-HST follow-up Patient presents today after home sleep study to discuss results which showed mild obstructive sleep apnea.  Her average oxygen level during the test was 93%.  She did have a noted low of 77%.   She continues to have daytime fatigue symptoms and naps in the afternoon.  She denies any morning headaches, narcolepsy or drowsy driving.  She had PFTs yesterday which showed severe obstructive lung disease with severe diffusion defect.  She had a borderline bronchodilator response (10%). she is not currently on any maintenance therapies.  She has tried some in the past but does not member what they were.  She did not like frustrated because it made her cough persistently.  She does continue to have shortness of breath with exertion and rare dry cough.  She is using albuterol 1-2 times a day.  She denies wheezing, orthopnea, chest pain, lower extremity swelling, PND, or palpitations.  Overall she feels well and offers no further complaints  Allergies  Allergen Reactions   Amiodarone Other (See Comments)    Liver function    Immunization History  Administered Date(s) Administered   Influenza,inj,Quad PF,6+ Mos 03/07/2021   Moderna Sars-Covid-2 Vaccination 01/09/2020, 02/06/2020   Pneumococcal Polysaccharide-23 03/07/2021    Past Medical History:  Diagnosis Date   Asthma    Cervical cancer ([place])    a. treated with partial removal of cervix   COPD (chronic obstructive pulmonary disease) ([place])    Hypertrophic cardiomyopathy ([place])    a. s/p myomectomy at [facility] 08/2004   Paroxysmal atrial flutter ([place])    a. post-op after myomectomy in 2006. s/p TEE/DCCV.   Sleep apnea    Transient atrial fibrillation ([place]) 2006   a. after myomectomy at [facility] in 08/2004 - was on amiodarone  but developed elevated LFTs felt possibly secondary to amiodarone     Tobacco History: Social History   Tobacco Use  Smoking Status Former   Packs/day: 0.50   Types: Cigarettes   Quit date: 12/11/2020   Years since quitting: 0.6  Smokeless Tobacco Never  Tobacco Comments   6-8 cigarettes daily   Counseling given: Not Answered Tobacco comments: 6-8 cigarettes daily   Outpatient Medications Prior to Visit   Medication Sig Dispense Refill   ALPRAZolam (XANAX) 0.25 MG tablet Take 1 tablet (0.25 mg total) by mouth 3 (three) times daily as needed for anxiety. 10 tablet 0   Ascorbic Acid (VITAMIN C PO) Take 1 tablet by mouth daily.     aspirin EC 81 MG tablet Take 81 mg by mouth daily.     Cholecalciferol (VITAMIN D3 PO) Take 1 tablet by mouth daily.     dabigatran (PRADAXA) 150 MG CAPS capsule Take 1 capsule (150 mg total) by mouth 2 (two) times daily. 60 capsule 6   docusate sodium (COLACE) 100 MG capsule Take 100 mg by mouth daily as needed for mild constipation.     dofetilide (TIKOSYN) 250 MCG capsule Take 1 capsule (250 mcg total) by mouth 2 (two) times daily. 60 capsule 6   HYDROcodone-acetaminophen (NORCO) 10-325 MG tablet Take 1 tablet by mouth every 4 (four) hours.     hydroxypropyl methylcellulose / hypromellose (ISOPTO TEARS / GONIOVISC) 2.5 % ophthalmic solution Place 1 drop into both eyes 2 (two) times daily as needed for dry eyes.     Magnesium Oxide (MAG-OXIDE) 200 MG TABS Take 1 tablet (200 mg total) by mouth daily. (Patient taking differently: Take 250 mg by mouth daily. Taking 250mg  by mouth daily) 90 tablet 1   metoprolol tartrate (LOPRESSOR) 50 MG tablet Take 1 tablet (50 mg total) by mouth 2 (two) times daily. 60 tablet 6   omeprazole (PRILOSEC) 40 MG capsule Take 40 mg by mouth daily.     potassium chloride (KLOR-CON) 10 MEQ tablet Take 4 tablets (40 mEq total) by mouth daily. 360 tablet 3   PROAIR HFA 108 (90 Base) MCG/ACT inhaler Inhale 2 puffs into the lungs every 6 (six) hours as needed for wheezing or shortness of breath.      torsemide (DEMADEX) 20 MG tablet Take 2 tablets (40 mg total) by mouth daily. 180 tablet 3   ipratropium (ATROVENT) 0.02 % nebulizer solution Take 0.25 mg by nebulization every 4 (four) hours as needed for wheezing or shortness of breath. (Patient not taking: Reported on 08/11/2021)     No facility-administered medications prior to visit.     Review of  Systems:   Constitutional: No weight loss or gain, night sweats, fevers, chills +daytime fatigue HEENT: No headaches, difficulty swallowing, tooth/dental problems, or sore throat. No sneezing, itching, ear ache, nasal congestion, or post nasal drip CV:  No chest pain, orthopnea, PND, swelling in lower extremities, anasarca, dizziness, palpitations, syncope Resp: +shortness of breath with exertion; rare dry cough. No excess mucus or change in color of mucus.  No hemoptysis. No wheezing.  No chest wall deformity GI:  No heartburn, indigestion, abdominal pain, nausea, vomiting, diarrhea, change in bowel habits, loss of appetite, bloody stools.  GU: No dysuria, change in color of urine, urgency or frequency.  No flank pain, no hematuria  Skin: No rash, lesions, ulcerations MSK:  No joint pain or swelling.  No decreased range of motion.  No back pain. Neuro: No dizziness or lightheadedness.  Psych:  No depression or anxiety. Mood stable.     Physical Exam:  BP 118/60 (BP Location: Left Arm, Cuff Size: Normal)    Pulse (!) 56    Ht 5\' 1"  (1.549 m)    Wt 180 lb 12.8 oz (82 kg)    SpO2 94%    BMI 34.16 kg/m   GEN: Pleasant, interactive, well-appearing; obese; in no acute distress HEENT:  Normocephalic and atraumatic. EACs patent bilaterally. TM pearly gray with present light reflex bilaterally. PERRLA. Sclera white. Nasal turbinates pink, moist and patent bilaterally. No rhinorrhea present. Oropharynx pink and moist, without exudate or edema. No lesions, ulcerations, or postnasal drip.  NECK:  Supple w/ fair ROM. No JVD present. Normal carotid impulses w/o bruits. Thyroid symmetrical with no goiter or nodules palpated. No lymphadenopathy.   CV: RRR, no m/r/g, no peripheral edema. Pulses intact, +2 bilaterally. No cyanosis, pallor or clubbing. PULMONARY:  Unlabored, regular breathing. Clear bilaterally A&P w/o wheezes/rales/rhonchi. No accessory muscle use. No dullness to percussion. GI: BS present  and normoactive. Soft, non-tender to palpation. No organomegaly or masses detected. No CVA tenderness. MSK: No erythema, warmth or tenderness. Cap refil <2 sec all extrem. No deformities or joint swelling noted.  Neuro: A/Ox3. No focal deficits noted.   Skin: Warm, no lesions or rashe Psych: Normal affect and behavior. Judgement and thought content appropriate.     Lab Results:  CBC    Component Value Date/Time   WBC 11.5 (H) 05/10/2021 1042   WBC 9.2 04/01/2021 1003   RBC 4.04 05/10/2021 1042   RBC 3.71 (L) 04/01/2021 1003   HGB 10.5 (L) 05/10/2021 1042   HCT 31.8 (L) 05/10/2021 1042   PLT 397 05/10/2021 1042   MCV 79 05/10/2021 1042   MCH 26.0 (L) 05/10/2021 1042   MCH 27.0 03/09/2021 0538   MCHC 33.0 05/10/2021 1042   MCHC 30.8 04/01/2021 1003   RDW 16.0 (H) 05/10/2021 1042   LYMPHSABS 2.0 04/01/2021 1003   LYMPHSABS 2.1 06/16/2016 1153   MONOABS 1.3 (H) 04/01/2021 1003   EOSABS 0.2 04/01/2021 1003   EOSABS 0.2 06/16/2016 1153   BASOSABS 0.1 04/01/2021 1003   BASOSABS 0.0 06/16/2016 1153    BMET    Component Value Date/Time   NA 137 06/17/2021 1353   NA 136 05/10/2021 1042   K 3.6 06/17/2021 1353   CL 96 (L) 06/17/2021 1353   CO2 32 06/17/2021 1353   GLUCOSE 99 06/17/2021 1353   BUN 18 06/17/2021 1353   BUN 21 05/10/2021 1042   CREATININE 0.93 06/17/2021 1353   CREATININE 1.17 (H) 03/22/2016 1434   CALCIUM 9.4 06/17/2021 1353   GFRNONAA >60 06/17/2021 1353   GFRAA >60 08/23/2019 1113    BNP    Component Value Date/Time   BNP 610.0 (H) 02/27/2021 2009     Imaging:  No results found.    PFT Results Latest Ref Rng & Units 08/10/2021  FVC-Pre L 1.49  FVC-Predicted Pre % 48  FVC-Post L 1.64  FVC-Predicted Post % 53  Pre FEV1/FVC % % 53  Post FEV1/FCV % % 53  FEV1-Pre L 0.79  FEV1-Predicted Pre % 33  FEV1-Post L 0.87  DLCO uncorrected ml/min/mmHg 8.59  DLCO UNC% % 46  DLVA Predicted % 64  TLC L 4.98  TLC % Predicted % 108  RV % Predicted %  187    No results found for: NITRICOXIDE      Assessment & Plan:   Stage 3 severe COPD by GOLD  classification ([place]) PFTs obtained yesterday consistent with severe COPD.  Continues to have issues with activity tolerance and DOE.  Not currently on any maintenance inhalers; prefers not to go back on [place].  Discussed initiating therapy with LAMA/LABA -patient agreed.  Continue to use albuterol as needed.  Advised to notify if any worsening side effects occur with inhaler therapy.  Patient Instructions  Start use CPAP 5-15 cmH2O every night, minimum of 4-6 hours a night.  Change equipment every 30 days or as directed by DME. Wash your tubing with warm soap and water daily, hang to dry. Wash humidifier portion weekly.  Maintain clean equipment, as directed by home health agency.  Be aware of reduced alertness and do not drive or operate heavy machinery if experiencing this or drowsiness.  Exercise encouraged, as tolerated. Healthy weight management discussed.  Avoid or decrease alcohol consumption and medications that make you more sleepy, if possible. Notify if persistent daytime sleepiness occurs even with consistent use of CPAP.  Continue Albuterol inhaler (Proair) 2 puffs or 3 mL neb every 6 hours as needed for shortness of breath or wheezing. Notify if symptoms persist despite rescue inhaler/neb use. Continue omeprazole 40 mg daily Stop atrovent nebs Start Stiolto 2 puffs daily for COPD. This is your maintenance inhaler   Follow up in 6-8 weeks with Dr. Halford Chessman or Roxan Diesel for CPAP compliance check. If symptoms do not improve or worsen, please contact office for sooner follow up or seek emergency care.    Mild obstructive sleep apnea Recent HST with very mild obstructive sleep apnea. We discussed how untreated sleep apnea puts an individual at risk for cardiac arrhthymias, pulm HTN, DM, stroke and increases their risk for daytime accidents; although these are minimal given her very  mild severity. We also briefly reviewed treatment options including weight loss, side sleeping position, oral appliance, CPAP therapy or referral to ENT for possible surgical options.  Patient preferred to go ahead and initiate therapy with CPAP. Will start with auto CPAP 5-15 cmH2O with nasal mask. Educated on use. Follow up within 31-90 days of starting for compliance  Respiratory failure with hypoxia (HCC) Appears to be resolved without any O2 requirements. Oxygen stable at visit. Previous walking oximetry without desaturations. SpO2 low on HST was 77% however, average was 93%.   Paroxysmal atrial fibrillation (HCC) Rate controlled at visit. Currently on Tikosyn. Not anticoagulated. Follow up with cardiology as scheduled.    I spent 35 minutes of dedicated to the care of this patient on the date of this encounter to include pre-visit review of records, face-to-face time with the patient discussing conditions above, post visit ordering of testing, clinical documentation with the electronic health record, making appropriate referrals as documented, and communicating necessary findings to members of the patients care team.  Clayton Bibles, NP 08/11/2021  Pt aware and understands NP's role.

## 2021-08-11 NOTE — Patient Instructions (Addendum)
Start use CPAP 5-15 cmH2O every night, minimum of 4-6 hours a night.  ?Change equipment every 30 days or as directed by DME. Wash your tubing with warm soap and water daily, hang to dry. Wash humidifier portion weekly.  ?Maintain clean equipment, as directed by home health agency.  ?Be aware of reduced alertness and do not drive or operate heavy machinery if experiencing this or drowsiness.  ?Exercise encouraged, as tolerated. ?Healthy weight management discussed.  ?Avoid or decrease alcohol consumption and medications that make you more sleepy, if possible. ?Notify if persistent daytime sleepiness occurs even with consistent use of CPAP. ? ?Continue Albuterol inhaler (Proair) 2 puffs or 3 mL neb every 6 hours as needed for shortness of breath or wheezing. Notify if symptoms persist despite rescue inhaler/neb use. ?Continue omeprazole 40 mg daily ?Stop atrovent nebs ?Start Stiolto 2 puffs daily for COPD. This is your maintenance inhaler  ? ?Follow up in 6-8 weeks with Dr. Halford Chessman or Roxan Diesel for CPAP compliance check. If symptoms do not improve or worsen, please contact office for sooner follow up or seek emergency care. ?

## 2021-08-11 NOTE — Assessment & Plan Note (Signed)
Rate controlled at visit. Currently on Tikosyn. Not anticoagulated. Follow up with cardiology as scheduled.  ?

## 2021-08-11 NOTE — Assessment & Plan Note (Addendum)
Appears to be resolved without any O2 requirements. Oxygen stable at visit. Previous walking oximetry without desaturations. SpO2 low on HST was 77% however, average was 93%.  ?

## 2021-08-11 NOTE — Assessment & Plan Note (Signed)
Recent HST with very mild obstructive sleep apnea. We discussed how untreated sleep apnea puts an individual at risk for cardiac arrhthymias, pulm HTN, DM, stroke and increases their risk for daytime accidents; although these are minimal given her very mild severity. We also briefly reviewed treatment options including weight loss, side sleeping position, oral appliance, CPAP therapy or referral to ENT for possible surgical options.  Patient preferred to go ahead and initiate therapy with CPAP. Will start with auto CPAP 5-15 cmH2O with nasal mask. Educated on use. Follow up within 31-90 days of starting for compliance ?

## 2021-08-11 NOTE — Assessment & Plan Note (Signed)
PFTs obtained yesterday consistent with severe COPD.  Continues to have issues with activity tolerance and DOE.  Not currently on any maintenance inhalers; prefers not to go back on [place].  Discussed initiating therapy with LAMA/LABA -patient agreed.  Continue to use albuterol as needed.  Advised to notify if any worsening side effects occur with inhaler therapy. ? ?Patient Instructions  ?Start use CPAP 5-15 cmH2O every night, minimum of 4-6 hours a night.  ?Change equipment every 30 days or as directed by DME. Wash your tubing with warm soap and water daily, hang to dry. Wash humidifier portion weekly.  ?Maintain clean equipment, as directed by home health agency.  ?Be aware of reduced alertness and do not drive or operate heavy machinery if experiencing this or drowsiness.  ?Exercise encouraged, as tolerated. ?Healthy weight management discussed.  ?Avoid or decrease alcohol consumption and medications that make you more sleepy, if possible. ?Notify if persistent daytime sleepiness occurs even with consistent use of CPAP. ? ?Continue Albuterol inhaler (Proair) 2 puffs or 3 mL neb every 6 hours as needed for shortness of breath or wheezing. Notify if symptoms persist despite rescue inhaler/neb use. ?Continue omeprazole 40 mg daily ?Stop atrovent nebs ?Start Stiolto 2 puffs daily for COPD. This is your maintenance inhaler  ? ?Follow up in 6-8 weeks with Dr. Halford Chessman or Roxan Diesel for CPAP compliance check. If symptoms do not improve or worsen, please contact office for sooner follow up or seek emergency care. ? ? ?

## 2021-08-16 ENCOUNTER — Telehealth: Payer: Self-pay | Admitting: Pulmonary Disease

## 2021-08-17 NOTE — Telephone Encounter (Signed)
Lm for Janett Billow with [facility]  ?

## 2021-08-18 NOTE — Telephone Encounter (Signed)
Called and spoke with Janett Billow at [facility] to let her know that I sent OV notes from 11/16 with Dr. Halford Chessman where we ordered the [place] and I also faxed over [place] notes from 3/1 where it says start CPAP and we ordered it for patient. She expressed understanding. Nothing further needed at this time.  ?

## 2021-09-02 DIAGNOSIS — G894 Chronic pain syndrome: Secondary | ICD-10-CM | POA: Diagnosis not present

## 2021-09-02 DIAGNOSIS — I890 Lymphedema, not elsewhere classified: Secondary | ICD-10-CM | POA: Diagnosis not present

## 2021-09-02 DIAGNOSIS — M47816 Spondylosis without myelopathy or radiculopathy, lumbar region: Secondary | ICD-10-CM | POA: Diagnosis not present

## 2021-09-02 DIAGNOSIS — M1991 Primary osteoarthritis, unspecified site: Secondary | ICD-10-CM | POA: Diagnosis not present

## 2021-09-02 DIAGNOSIS — I872 Venous insufficiency (chronic) (peripheral): Secondary | ICD-10-CM | POA: Diagnosis not present

## 2021-09-02 DIAGNOSIS — I809 Phlebitis and thrombophlebitis of unspecified site: Secondary | ICD-10-CM | POA: Diagnosis not present

## 2021-09-02 DIAGNOSIS — G4733 Obstructive sleep apnea (adult) (pediatric): Secondary | ICD-10-CM | POA: Diagnosis not present

## 2021-09-28 ENCOUNTER — Ambulatory Visit (INDEPENDENT_AMBULATORY_CARE_PROVIDER_SITE_OTHER): Payer: Medicare Other | Admitting: Pulmonary Disease

## 2021-09-28 ENCOUNTER — Encounter: Payer: Self-pay | Admitting: Pulmonary Disease

## 2021-09-28 VITALS — BP 136/74 | HR 56 | Temp 97.9°F | Ht 61.0 in | Wt 182.0 lb

## 2021-09-28 DIAGNOSIS — G4733 Obstructive sleep apnea (adult) (pediatric): Secondary | ICD-10-CM | POA: Diagnosis not present

## 2021-09-28 DIAGNOSIS — J449 Chronic obstructive pulmonary disease, unspecified: Secondary | ICD-10-CM

## 2021-09-28 NOTE — Patient Instructions (Signed)
Follow up in 6 months in [place] office ?

## 2021-09-28 NOTE — Progress Notes (Signed)
? ?[facility] ? ?Chief Complaint  ?Patient presents with  ? Follow-up  ?  Dry cough since starting Stiolto   ? ? ?Past Surgical History:  ?She  has a past surgical history that includes Septal myomectomy (06/13/2004); Tubal ligation; Cardioversion (N/A, 08/12/2015); TEE without cardioversion (N/A, 03/29/2016); TEE without cardioversion (N/A, 06/23/2016); Cardioversion (N/A, 08/24/2020); transesophageal echocardiogram (2005); and Cardioversion (N/A, 04/13/2021). ? ?Past Medical History:  ?Asthma, Cervical cancer, COPD, Hypertrophic cardiomyopathy, PAF, HTN ? ?Constitutional:  ?BP 136/74 (BP Location: Left Arm, Patient Position: Sitting, Cuff Size: Normal)   Pulse (!) 56   Temp 97.9 ?F (36.6 ?C) (Oral)   Ht '5\' 1"'$  (1.549 m)   Wt 182 lb (82.6 kg)   SpO2 99%   BMI 34.39 kg/m?  ? ?Brief Summary:  ?Kathleen Wright is a 56 y.o. female former smoker with obstructive sleep apnea and COPD. ?  ? ? ? ?Subjective:  ? ?PFT showed severe obstruction, air trapping, and moderate to severe diffusion defect. ? ?Stiolto helps breathing, but causes a cough.  Cough isn't bad and she feels she can tolerate this.   ? ?Sleep study showed mild sleep apnea.  Started on auto CPAP.  Helps her sleep, but still getting adjusted to wearing a mask. ? ?Physical Exam:  ? ?Appearance - well kempt  ? ?ENMT - no sinus tenderness, no oral exudate, no LAN, Mallampati 3 airway, no stridor, wears dentures ? ?Respiratory - equal breath sounds bilaterally, no wheezing or rales ? ?CV - s1s2 regular rate and rhythm, no murmurs ? ?Ext - no clubbing, no edema ? ?Skin - no rashes ? ?Psych - normal mood and affect ?  ?Pulmonary testing:  ?ABG with 32% FiO2 03/02/21 >> pH 7.29, PCO2 64, PO2 81.3 ?A1AT 04/01/21 >> 203, MM ?IgE 04/01/21 >> 11 ?PFT 08/10/21 >> FEV1 0.87 (36%), FEV1% 53, TLC 4.98 (108%), DLCO 46% ? ?Chest Imaging:  ?CT angio chest 02/28/21 >> bronchial wall thickening, multifocal pneumonia ? ?Sleep Tests:   ?HST 07/29/21 >> AHI 6.1, SpO2 low 77% ?Auto CPAP 08/29/21 to 09/27/21 >> used on 18 of 30 nights with average 4 hrs 22 min.  Average AHI 1.6 with median CPAP 7 and 95 th percentile CPAP 11 cm H2O ? ?Cardiac Tests:  ?Echo 02/28/21 >> EF 65 to 70%, mod LVH, grade 3 DD, RVSP 43.4 mmHg, severe LA dilation, mod RA dilation, mod pericardial effusion, mod MR, mild/mod TR ? ?Social History:  ?She  reports that she quit smoking about 9 months ago. Her smoking use included cigarettes. She smoked an average of .5 packs per day. She has never used smokeless tobacco. She reports that she does not drink alcohol and does not use drugs. ? ?Family History:  ?Her family history includes Diabetes in an other family member; Heart failure in her father; Lung disease in an other family member. ?  ? ? ?Assessment/Plan:  ? ?Obstructive sleep apnea. ?- she is compliant with CPAP and reports benefit from therapy ?- uses [facility] for her DME ?- continue auto CPAP 5 to 15 cm H2O ? ?History of tobacco abuse with presumed COPD. ?- she was not able to tolerate breztri or breo due to increased cough and dyspnea ?- continue stiolto ?- prn albuterol ? ?Atrial fibrillation, valvular heart disease. ?- followed by Dr. Candee Furbish with [facility] [facility] ? ?Obesity. ?- discussed how weight can impact sleep and risk for sleep disordered breathing ?- discussed options to assist with weight  loss: combination of diet modification, cardiovascular and strength training exercises ? ?Time Spent Involved in Patient Care on Day of Examination:  ?36 minutes ? ?Follow up:  ? ?Patient Instructions  ?Follow up in 6 months in [place] office ? ?Medication List:  ? ?Allergies as of 09/28/2021   ? ?   Reactions  ? Amiodarone Other (See Comments)  ? Liver function  ? ?  ? ?  ?Medication List  ?  ? ?  ? Accurate as of September 28, 2021  2:43 PM. If you have any questions, ask your nurse or doctor.  ?  ?  ? ?  ? ?ALPRAZolam 0.25 MG tablet ?Commonly known as:  Xanax ?Take 1 tablet (0.25 mg total) by mouth 3 (three) times daily as needed for anxiety. ?  ?aspirin EC 81 MG tablet ?Take 81 mg by mouth daily. ?  ?dabigatran 150 MG Caps capsule ?Commonly known as: Pradaxa ?Take 1 capsule (150 mg total) by mouth 2 (two) times daily. ?  ?docusate sodium 100 MG capsule ?Commonly known as: COLACE ?Take 100 mg by mouth daily as needed for mild constipation. ?  ?dofetilide 250 MCG capsule ?Commonly known as: TIKOSYN ?Take 1 capsule (250 mcg total) by mouth 2 (two) times daily. ?  ?HYDROcodone-acetaminophen 10-325 MG tablet ?Commonly known as: NORCO ?Take 1 tablet by mouth every 4 (four) hours. ?  ?hydroxypropyl methylcellulose / hypromellose 2.5 % ophthalmic solution ?Commonly known as: ISOPTO TEARS / GONIOVISC ?Place 1 drop into both eyes 2 (two) times daily as needed for dry eyes. ?  ?Magnesium Oxide 200 MG Tabs ?Commonly known as: Mag-Oxide ?Take 1 tablet (200 mg total) by mouth daily. ?What changed:  ?how much to take ?additional instructions ?  ?metoprolol tartrate 50 MG tablet ?Commonly known as: LOPRESSOR ?Take 1 tablet (50 mg total) by mouth 2 (two) times daily. ?  ?omeprazole 40 MG capsule ?Commonly known as: PRILOSEC ?Take 40 mg by mouth daily. ?  ?potassium chloride 10 MEQ tablet ?Commonly known as: KLOR-CON ?Take 4 tablets (40 mEq total) by mouth daily. ?  ?ProAir HFA 108 (90 Base) MCG/ACT inhaler ?Generic drug: albuterol ?Inhale 2 puffs into the lungs every 6 (six) hours as needed for wheezing or shortness of breath. ?  ?albuterol (2.5 MG/3ML) 0.083% nebulizer solution ?Commonly known as: PROVENTIL ?Take 3 mLs (2.5 mg total) by nebulization every 6 (six) hours as needed for wheezing or shortness of breath. ?  ?Stiolto Respimat 2.5-2.5 MCG/ACT Aers ?Generic drug: Tiotropium Bromide-Olodaterol ?Inhale 2 puffs into the lungs daily. ?What changed: Another medication with the same name was removed. Continue taking this medication, and follow the directions you see  here. ?Changed by: Chesley Mires, MD ?  ?torsemide 20 MG tablet ?Commonly known as: Demadex ?Take 2 tablets (40 mg total) by mouth daily. ?  ?VITAMIN C PO ?Take 1 tablet by mouth daily. ?  ?VITAMIN D3 PO ?Take 1 tablet by mouth daily. ?  ? ?  ? ? ?Signature:  ?Chesley Mires, MD ?[place] ?Pager - [phone] ?09/28/2021, 2:43 PM ?  ? ? ? ? ? ? ? ? ?

## 2021-10-03 DIAGNOSIS — G4733 Obstructive sleep apnea (adult) (pediatric): Secondary | ICD-10-CM | POA: Diagnosis not present

## 2021-10-04 DIAGNOSIS — M47816 Spondylosis without myelopathy or radiculopathy, lumbar region: Secondary | ICD-10-CM | POA: Diagnosis not present

## 2021-10-04 DIAGNOSIS — J449 Chronic obstructive pulmonary disease, unspecified: Secondary | ICD-10-CM | POA: Diagnosis not present

## 2021-10-04 DIAGNOSIS — M1991 Primary osteoarthritis, unspecified site: Secondary | ICD-10-CM | POA: Diagnosis not present

## 2021-10-04 DIAGNOSIS — G894 Chronic pain syndrome: Secondary | ICD-10-CM | POA: Diagnosis not present

## 2021-11-01 ENCOUNTER — Telehealth (INDEPENDENT_AMBULATORY_CARE_PROVIDER_SITE_OTHER): Payer: Medicare Other | Admitting: Nurse Practitioner

## 2021-11-01 ENCOUNTER — Encounter: Payer: Self-pay | Admitting: Nurse Practitioner

## 2021-11-01 DIAGNOSIS — G4733 Obstructive sleep apnea (adult) (pediatric): Secondary | ICD-10-CM

## 2021-11-01 DIAGNOSIS — J449 Chronic obstructive pulmonary disease, unspecified: Secondary | ICD-10-CM | POA: Diagnosis not present

## 2021-11-01 NOTE — Assessment & Plan Note (Signed)
Wears nightly but doesn't always spend the entire night in the bed so only 73% >4 hours. Advise that she aim to increase her usage to 6 hours a night, if not the whole night, if possible. Continues to receive good benefit and OSA appears well controlled.

## 2021-11-01 NOTE — Progress Notes (Signed)
Patient ID: Kathleen Wright, female     DOB: [birth year], 57 y.o.      MRN: [id number]  Chief Complaint  Patient presents with   Follow-up    She is doing well with the nasal cannula.     Virtual Visit via Video Note  I connected with Kathleen Wright on 11/01/21 at 11:00 AM EDT by a video enabled telemedicine application and verified that I am speaking with the correct person using two identifiers.  Location: Patient: Home Provider: Office   I discussed the limitations of evaluation and management by telemedicine and the availability of in person appointments. The patient expressed understanding and agreed to proceed.  History of Present Illness: 56 year old female, former smoker followed for mild obstructive sleep apnea and COPD.  She is a patient Dr. Gustavus Bryant and followed by Dr. Halford Chessman for sleep.  Most recent office visit was 09/28/2021.  Past medical history significant for PAF, hypertrophic cardiomyopathy, hypertension, DDD.  She is followed by cardiology.  08/11/2021: OV with Marci Polito NP.  Follow-up for HST which showed mild obstructive sleep apnea; reported continued daytime fatigue symptoms and frequent naps in the afternoon.  Open to CPAP therapy.  New start sent for auto 5 to 15 cmH2O. she also underwent PFTs prior to this visit which showed severe COPD with severe diffusion defect. Was not on any maintenance therapies; had tried some inhalers which made her cough in the past but was unsure what they were.  Started on [employer].   09/28/2021: OV with Dr. Halford Chessman.  Has been on CPAP for a few weeks; use 18 out of 30 nights.  Reported benefit from use.  Felt like Stiolto had helped her breathing.  Felt like it made her cough afterwards but cough was not bad so she was able to tolerate it.  Continued on Stiolto.  11/01/2021: Today-follow-up Patient presents today via virtual visit for follow-up to evaluate for CPAP compliance.  She reports that she has been wearing it every night but does feel like she  takes it off sometimes.  She also sometimes falls asleep on the couch at first and then moves to the bed and will apply it when she gets in the bed.  Feels like it has taken some getting used to as she doesn't like anything on her face.  She does feel like her daytime fatigue symptoms have improved and that she sleeps better at night with it.  Denies morning headaches or drowsy driving. Breathing overall stable.  Does feel like Stiolto helped.  Denies any wheezing or increased shortness of breath.  09/29/2021-10/28/2021 Airview download: CPAP 5-15 30/30 days used; 73% >4 hr; average 4 hours 43 minutes Pressure median 7.4, 95th 11 Leaks median 0, 95th 9 AHI 0.9  Allergies  Allergen Reactions   Amiodarone Other (See Comments)    Liver function   Immunization History  Administered Date(s) Administered   Influenza,inj,Quad PF,6+ Mos 03/07/2021   Moderna Sars-Covid-2 Vaccination 01/09/2020, 02/06/2020   Pneumococcal Polysaccharide-23 03/07/2021   Past Medical History:  Diagnosis Date   Asthma    Cervical cancer ([place])    a. treated with partial removal of cervix   COPD (chronic obstructive pulmonary disease) ([place])    Hypertrophic cardiomyopathy ([place])    a. s/p myomectomy at [facility] 08/2004   Paroxysmal atrial flutter ([place])    a. post-op after myomectomy in 2006. s/p TEE/DCCV.   Sleep apnea    Transient atrial fibrillation ([place]) 2006   a. after myomectomy  at [facility] in 08/2004 - was on amiodarone but developed elevated LFTs felt possibly secondary to amiodarone     Tobacco History: Social History   Tobacco Use  Smoking Status Former   Packs/day: 0.50   Types: Cigarettes   Quit date: 12/11/2020   Years since quitting: 0.8  Smokeless Tobacco Never  Tobacco Comments   6-8 cigarettes daily   Counseling given: Not Answered Tobacco comments: 6-8 cigarettes daily   Outpatient Medications Prior to Visit  Medication Sig Dispense Refill   albuterol (PROVENTIL) (2.5 MG/3ML) 0.083% nebulizer  solution Take 3 mLs (2.5 mg total) by nebulization every 6 (six) hours as needed for wheezing or shortness of breath. 75 mL 5   ALPRAZolam (XANAX) 0.25 MG tablet Take 1 tablet (0.25 mg total) by mouth 3 (three) times daily as needed for anxiety. 10 tablet 0   Ascorbic Acid (VITAMIN C PO) Take 1 tablet by mouth daily.     aspirin EC 81 MG tablet Take 81 mg by mouth daily.     Cholecalciferol (VITAMIN D3 PO) Take 1 tablet by mouth daily.     dabigatran (PRADAXA) 150 MG CAPS capsule Take 1 capsule (150 mg total) by mouth 2 (two) times daily. 60 capsule 6   docusate sodium (COLACE) 100 MG capsule Take 100 mg by mouth daily as needed for mild constipation.     dofetilide (TIKOSYN) 250 MCG capsule Take 1 capsule (250 mcg total) by mouth 2 (two) times daily. 60 capsule 6   HYDROcodone-acetaminophen (NORCO) 10-325 MG tablet Take 1 tablet by mouth every 4 (four) hours.     hydroxypropyl methylcellulose / hypromellose (ISOPTO TEARS / GONIOVISC) 2.5 % ophthalmic solution Place 1 drop into both eyes 2 (two) times daily as needed for dry eyes.     Magnesium Oxide (MAG-OXIDE) 200 MG TABS Take 1 tablet (200 mg total) by mouth daily. (Patient taking differently: Take 250 mg by mouth daily. Taking '250mg'$  by mouth daily) 90 tablet 1   metoprolol tartrate (LOPRESSOR) 50 MG tablet Take 1 tablet (50 mg total) by mouth 2 (two) times daily. 60 tablet 6   omeprazole (PRILOSEC) 40 MG capsule Take 40 mg by mouth daily.     potassium chloride (KLOR-CON) 10 MEQ tablet Take 4 tablets (40 mEq total) by mouth daily. 360 tablet 3   PROAIR HFA 108 (90 Base) MCG/ACT inhaler Inhale 2 puffs into the lungs every 6 (six) hours as needed for wheezing or shortness of breath.      Tiotropium Bromide-Olodaterol (STIOLTO RESPIMAT) 2.5-2.5 MCG/ACT AERS Inhale 2 puffs into the lungs daily. 1 each 5   torsemide (DEMADEX) 20 MG tablet Take 2 tablets (40 mg total) by mouth daily. 180 tablet 3   No facility-administered medications prior to visit.      Review of Systems:   Constitutional: No weight loss or gain, night sweats, fevers, chills, fatigue, or lassitude. HEENT: No headaches, difficulty swallowing, tooth/dental problems, or sore throat. No sneezing, itching, ear ache, nasal congestion, or post nasal drip CV:  No chest pain, orthopnea, PND, swelling in lower extremities, anasarca, dizziness, palpitations, syncope Resp: +shortness of breath with exertion (improved). No excess mucus or change in color of mucus. No productive or non-productive. No hemoptysis. No wheezing.  No chest wall deformity GU: No dysuria, change in color of urine, urgency or frequency.  No flank pain, no hematuria  Skin: No rash, lesions, ulcerations Neuro: No dizziness or lightheadedness.  Psych: No depression or anxiety. Mood stable.   Observations/Objective:  Patient is well-developed, well-nourished in no acute distress; A&Ox3. Resting comfortably at home. Unlabored breathing. Speech is clear and coherent with logical content.    Assessment and Plan: Mild obstructive sleep apnea Wears nightly but doesn't always spend the entire night in the bed so only 73% >4 hours. Advise that she aim to increase her usage to 6 hours a night, if not the whole night, if possible. Continues to receive good benefit and OSA appears well controlled.   Stage 3 severe COPD by GOLD classification ([place]) Compensated on current regimen. Good response to Stiolto; able to tolerate cough which resolves after initial use. Continue PRN albuterol.     Follow Up Instructions: Follow up in 5 months with Dr. Halford Chessman at the [place] office, as previously scheduled. If symptoms do not improve or worsen, please contact office for sooner follow up or seek emergency care.    I discussed the assessment and treatment plan with the patient. The patient was provided an opportunity to ask questions and all were answered. The patient agreed with the plan and demonstrated an understanding of the  instructions.   The patient was advised to call back or seek an in-person evaluation if the symptoms worsen or if the condition fails to improve as anticipated.  I provided 22 minutes of non-face-to-face time during this encounter.   Clayton Bibles, NP

## 2021-11-01 NOTE — Assessment & Plan Note (Signed)
Compensated on current regimen. Good response to Stiolto; able to tolerate cough which resolves after initial use. Continue PRN albuterol.

## 2021-11-02 DIAGNOSIS — G4733 Obstructive sleep apnea (adult) (pediatric): Secondary | ICD-10-CM | POA: Diagnosis not present

## 2021-11-02 DIAGNOSIS — G894 Chronic pain syndrome: Secondary | ICD-10-CM | POA: Diagnosis not present

## 2021-11-05 ENCOUNTER — Telehealth: Payer: Self-pay | Admitting: Nurse Practitioner

## 2021-11-05 NOTE — Telephone Encounter (Signed)
Printed last office note from [place] NP. Faxed to number that Mariann Laster from [employer] provided. Called and left voicemail that office notes were being faxed. Nothing further needed

## 2021-11-10 NOTE — Progress Notes (Signed)
Reviewed and agree with assessment/plan.   Chesley Mires, MD [facility] Pulmonary/Critical Care 11/10/2021, 12:47 PM Pager:  [phone]

## 2021-11-21 NOTE — Progress Notes (Unsigned)
Cardiology Office Note Date:  11/21/2021  Patient ID:  Kathleen Wright, Kathleen Wright [birth year], MRN [id number] PCP:  [facility]  Cardiologist:  Dr. Marlou Porch Electrophysiologist: Dr. Quentin Ore    Chief Complaint: planned f/u  History of Present Illness: Kathleen Wright is a 56 y.o. female with history of HOCM (s/p myomectomy at [facility] 2006), chronic CHF (diastolic), COPD, AFib (prior ablation [facility] 2018)  Edema has been felt to be chronic (?), at her last AFib clinic visit, rec VVS eval  She saw Dr. Marlou Porch 04/07/21, was back in AF rates 120s, planned for DCCV and EP follow up  DCCV 04/13/2021 successful  She saw  Dr. Quentin Ore, he saw her for the 1st consult 05/04/21, not felt to be a repeat ablation candidate 2/2 chronic respiratory issues.  Planned for Tikosyn.  Though was markedly volume OL, the pt reporting lasix no longer effective. Lasix stopped, started on torsemide, her K+ adjusted and planned for early APP eval and labs.  I saw her April 30, 2021 She is doing much better, legs are back to normal, breathing easier.  She does not suspect she is in Afib today Afib when fast she feels palpitations can tell in her chest though usually makes her feel tired, without eneergy Denies any CP or cardiac awareness She reports compliance with her Pradaxa  She is scheduled for Tikosyn loading admission 05/25/21  She saw Dr. Quentin Ore post Kathleen Wright load Feb 2023, doing well without symptoms of AFib. Planned for 4 mo APP visit.  TODAY She does not think she has had any AFib since on Tikosyn. She was previously well aware of her Afib and thinks she would be able to tell. No CP She follows with her pulmonologist Dr. Halford Chessman regularly they have been trying to find the right combination of inhalers for her, currently the best that she has haad. Has a cough ut overall her breathing is better No dizzy spells, near syncope or syncope. No bleeding or signs of bleeding  She reports excellent mediation  compliance  She inquires aboyut b/l areas on her loewer legs (below mid-shin medial aspects of both legs that has a firm and sensitive area. +/- 6 months or longer, inchanged.   AAD Hx Diagnosed 2006 (seems about the time of her myomectomy) Notes report intolerant of amiodarone 2/2 elevated LFTs Had a 10 year hiatus of her AF though started back up about 2016 Planned for PVI ablation Aug 2017 though pre-procedure TEE + LAA thrombus and cancelled Rescheduled Oct 2017 though TEE again with improved thrombus NOT resolved and again cancelled  Planned to refer to [facility]  PVI ablation at [facility] 03/07/2017  Followed predominantly at the AFib clinic, has some AFib issues though the pt not wanted to consider repeat ablation (march 2021 visit), more Afib march 2022 felt provoked by personal stressors associated with her husband's illness/death.  2021-04-30 referred to Dr. Quentin Ore, not felt a repeat PVI candidate> planned for Tikosyn  Tikosyn started Dec 2022 (dose reduced after a NSVT episode during load)    Past Medical History:  Diagnosis Date   Asthma    Cervical cancer ([place])    a. treated with partial removal of cervix   COPD (chronic obstructive pulmonary disease) ([place])    Hypertrophic cardiomyopathy ([place])    a. s/p myomectomy at [facility] 08/2004   Paroxysmal atrial flutter ([place])    a. post-op after myomectomy in 2006. s/p TEE/DCCV.   Sleep apnea    Transient atrial fibrillation ([place]) 2006  a. after myomectomy at [facility] in 08/2004 - was on amiodarone but developed elevated LFTs felt possibly secondary to amiodarone     Past Surgical History:  Procedure Laterality Date   CARDIOVERSION N/A 08/12/2015   Procedure: CARDIOVERSION;  Surgeon: Satira Sark, MD;  Location: [facility] ENDO SUITE;  Service: Cardiovascular;  Laterality: N/A;   CARDIOVERSION N/A 08/24/2020   Procedure: CARDIOVERSION;  Surgeon: Jerline Pain, MD;  Location: [facility] ENDOSCOPY;  Service: Cardiovascular;  Laterality: N/A;    CARDIOVERSION N/A 04/13/2021   Procedure: CARDIOVERSION;  Surgeon: Satira Sark, MD;  Location: [facility] ORS;  Service: Cardiovascular;  Laterality: N/A;   Septal myomectomy  06/13/2004   [facility]   TEE WITHOUT CARDIOVERSION N/A 03/29/2016   Procedure: TRANSESOPHAGEAL ECHOCARDIOGRAM (TEE);  Surgeon: Lelon Perla, MD;  Location: [facility] ENDOSCOPY;  Service: Cardiovascular;  Laterality: N/A;   TEE WITHOUT CARDIOVERSION N/A 06/23/2016   Procedure: TRANSESOPHAGEAL ECHOCARDIOGRAM (TEE);  Surgeon: Thayer Headings, MD;  Location[facility] ENDOSCOPY;  Service: Cardiovascular;  Laterality: N/A;   TRANSESOPHAGEAL ECHOCARDIOGRAM  2005   TUBAL LIGATION      Current Outpatient Medications  Medication Sig Dispense Refill   albuterol (PROVENTIL) (2.5 MG/3ML) 0.083% nebulizer solution Take 3 mLs (2.5 mg total) by nebulization every 6 (six) hours as needed for wheezing or shortness of breath. 75 mL 5   ALPRAZolam (XANAX) 0.25 MG tablet Take 1 tablet (0.25 mg total) by mouth 3 (three) times daily as needed for anxiety. 10 tablet 0   Ascorbic Acid (VITAMIN C PO) Take 1 tablet by mouth daily.     aspirin EC 81 MG tablet Take 81 mg by mouth daily.     Cholecalciferol (VITAMIN D3 PO) Take 1 tablet by mouth daily.     dabigatran (PRADAXA) 150 MG CAPS capsule Take 1 capsule (150 mg total) by mouth 2 (two) times daily. 60 capsule 6   docusate sodium (COLACE) 100 MG capsule Take 100 mg by mouth daily as needed for mild constipation.     dofetilide (TIKOSYN) 250 MCG capsule Take 1 capsule (250 mcg total) by mouth 2 (two) times daily. 60 capsule 6   HYDROcodone-acetaminophen (NORCO) 10-325 MG tablet Take 1 tablet by mouth every 4 (four) hours.     hydroxypropyl methylcellulose / hypromellose (ISOPTO TEARS / GONIOVISC) 2.5 % ophthalmic solution Place 1 drop into both eyes 2 (two) times daily as needed for dry eyes.     Magnesium Oxide (MAG-OXIDE) 200 MG TABS Take 1 tablet (200 mg total) by mouth daily. (Patient taking differently:  Take 250 mg by mouth daily. Taking '250mg'$  by mouth daily) 90 tablet 1   metoprolol tartrate (LOPRESSOR) 50 MG tablet Take 1 tablet (50 mg total) by mouth 2 (two) times daily. 60 tablet 6   omeprazole (PRILOSEC) 40 MG capsule Take 40 mg by mouth daily.     potassium chloride (KLOR-CON) 10 MEQ tablet Take 4 tablets (40 mEq total) by mouth daily. 360 tablet 3   PROAIR HFA 108 (90 Base) MCG/ACT inhaler Inhale 2 puffs into the lungs every 6 (six) hours as needed for wheezing or shortness of breath.      Tiotropium Bromide-Olodaterol (STIOLTO RESPIMAT) 2.5-2.5 MCG/ACT AERS Inhale 2 puffs into the lungs daily. 1 each 5   torsemide (DEMADEX) 20 MG tablet Take 2 tablets (40 mg total) by mouth daily. 180 tablet 3   No current facility-administered medications for this visit.    Allergies:   Amiodarone   Social History:  The patient  reports that she quit smoking about a year ago. Her smoking use included cigarettes. She smoked an average of .5 packs per day. She has never used smokeless tobacco. She reports that she does not drink alcohol and does not use drugs.   Family History:  The patient's family history includes Diabetes in an other family member; Heart failure in her father; Lung disease in an other family member.  ROS:  Please see the history of present illness.    All other systems are reviewed and otherwise negative.   PHYSICAL EXAM:  VS:  There were no vitals taken for this visit. BMI: There is no height or weight on file to calculate BMI. Well nourished, well developed, in no acute distress HEENT: normocephalic, atraumatic Neck: no JVD, carotid bruits or masses Cardiac:  RRR; no significant murmurs, no rubs, or gallops Lungs:  CTA b/l, no wheezing, rhonchi or rales Abd: soft, nontender MS: no deformity or atrophy Ext: no edema, b/l LE has area of subcutaneous firmness,  is below midshin, medial to anterior aspect of her legs b/l, no skin changes, 5cm x 5cm Skin: warm and dry, no  rash Neuro:  No gross deficits appreciated Psych: euthymic mood, full affect    EKG:  Done today and reviewed by myself shows  SB 57bpm, 1st degree VAblock 27m, QTc 4725m  02/28/21: TTE IMPRESSIONS   1. Left ventricular ejection fraction, by estimation, is 65 to 70%. The  left ventricle has normal function. The left ventricle has no regional  wall motion abnormalities. There is moderate asymmetric left ventricular  hypertrophy of the septal segment.  Left ventricular diastolic parameters are consistent with Grade III  diastolic dysfunction (restrictive).   2. Right ventricular systolic function is low normal. The right  ventricular size is mildly enlarged. Mildly increased right ventricular  wall thickness. There is mildly elevated pulmonary artery systolic  pressure. The estimated right ventricular systolic   pressure is 4316.1mHg.   3. Left atrial size was severely dilated.   4. Right atrial size was moderately dilated.   5. Moderate pericardial effusion. The pericardial effusion is posterior  to the left ventricle.   6. The mitral valve is abnormal. Moderate mitral valve regurgitation.  Moderate mitral annular calcification.   7. Tricuspid valve regurgitation is mild to moderate.   8. The aortic valve is tricuspid. Aortic valve regurgitation is not  visualized.   9. The inferior vena cava is normal in size with greater than 50%  respiratory variability, suggesting right atrial pressure of 3 mmHg.   Comparison(s): Prior images reviewed side by side. LVEF remains normal.  There is moderate LVH including septal hypertrophy related to RVPushmatahaPatient  reported to be status post septal myomectomy. No obvious LVOT gradient at  rest.   Recent Labs: 02/27/2021: B Natriuretic Peptide 610.0 02/28/2021: ALT 16 04/01/2021: Pro B Natriuretic peptide (BNP) 710.0; TSH 2.66 05/10/2021: Hemoglobin 10.5; Platelets 397 06/17/2021: BUN 18; Creatinine, Ser 0.93; Magnesium 2.0; Potassium 3.6;  Sodium 137  No results found for requested labs within last 365 days.   CrCl cannot be calculated (Patient's most recent lab result is older than the maximum 21 days allowed.).   Wt Readings from Last 3 Encounters:  09/28/21 182 lb (82.6 kg)  08/11/21 180 lb 12.8 oz (82 kg)  07/20/21 185 lb 3.2 oz (84 kg)     Other studies reviewed: Additional studies/records reviewed today include: summarized above  ASSESSMENT AND PLAN:  Paroxysmal Afib, atypical Aflutter CHA2DS2Vasc is  2, on  Pradaxa, appropriately dosed QTc is stable on dofetilide No AFib by symptoms Labs today Tikosyn teaching re-enforced  HOCM Hx of myomectomy remotely No LVOT gradient on most recent echo this year No murmur appreciated on exam  Chronic CHF (diastolic) She does not appear volume OL  4. LE findigs Unclear to me, unchanged in [place] longer, has been evaluated by her PMD, no clear diagnosis/what he felt it was Last time was resolved she said was after a hospitalization that they gave her IV diuretics, though she is not edematous above/below or around the areas. She might consider derm eval    Disposition: have her back in [place] if needed   Current medicines are reviewed at length with the patient today.  The patient did not have any concerns regarding medicines.  SiVenetia NightPA-C 11/21/2021 9:42 AM     [place] [phone])  ([phone])

## 2021-11-22 ENCOUNTER — Ambulatory Visit (INDEPENDENT_AMBULATORY_CARE_PROVIDER_SITE_OTHER): Payer: Medicare Other | Admitting: Physician Assistant

## 2021-11-22 ENCOUNTER — Encounter: Payer: Self-pay | Admitting: Physician Assistant

## 2021-11-22 VITALS — BP 114/66 | HR 7 | Ht 61.0 in | Wt 190.0 lb

## 2021-11-22 DIAGNOSIS — Z5181 Encounter for therapeutic drug level monitoring: Secondary | ICD-10-CM | POA: Diagnosis not present

## 2021-11-22 DIAGNOSIS — Z79899 Other long term (current) drug therapy: Secondary | ICD-10-CM | POA: Diagnosis not present

## 2021-11-22 DIAGNOSIS — I480 Paroxysmal atrial fibrillation: Secondary | ICD-10-CM

## 2021-11-22 DIAGNOSIS — I5032 Chronic diastolic (congestive) heart failure: Secondary | ICD-10-CM

## 2021-11-22 NOTE — Patient Instructions (Signed)
Medication Instructions:   Your physician recommends that you continue on your current medications as directed. Please refer to the Current Medication list given to you today.  *If you need a refill on your cardiac medications before your next appointment, please call your pharmacy*   Lab Work:  BMET AND [place]   If you have labs (blood work) drawn today and your tests are completely normal, you will receive your results only by: [place] (if you have MyChart) OR A paper copy in the mail If you have any lab test that is abnormal or we need to change your treatment, we will call you to review the results.   Testing/Procedures: NONE ORDERED  TODAY\    Follow-Up: At [facility], you and your health needs are our priority.  As part of our continuing mission to provide you with exceptional heart care, we have created designated Provider Care Teams.  These Care Teams include your primary Cardiologist (physician) and Advanced Practice Providers (APPs -  Physician Assistants and Nurse Practitioners) who all work together to provide you with the care you need, when you need it.  We recommend signing up for the patient portal called "MyChart".  Sign up information is provided on this After Visit Summary.  MyChart is used to connect with patients for Virtual Visits (Telemedicine).  Patients are able to view lab/test results, encounter notes, upcoming appointments, etc.  Non-urgent messages can be sent to your provider as well.   To learn more about what you can do with MyChart, go to [email or web address].    Your next appointment:   6 month(s)  The format for your next appointment:   In Person  Provider:   Tommye Standard, PA-C    Other Instructions  Important Information About [place]

## 2021-11-23 LAB — BASIC METABOLIC PANEL
BUN/Creatinine Ratio: 18 (ref 9–23)
BUN: 17 mg/dL (ref 6–24)
CO2: 22 mmol/L (ref 20–29)
Calcium: 9.2 mg/dL (ref 8.7–10.2)
Chloride: 99 mmol/L (ref 96–106)
Creatinine, Ser: 0.96 mg/dL (ref 0.57–1.00)
Glucose: 70 mg/dL (ref 70–99)
Potassium: 4.1 mmol/L (ref 3.5–5.2)
Sodium: 138 mmol/L (ref 134–144)
eGFR: 69 mL/min/{1.73_m2} (ref >59)

## 2021-11-23 LAB — MAGNESIUM: Magnesium: 2.1 mg/dL (ref 1.6–2.3)

## 2021-12-01 DIAGNOSIS — M25512 Pain in left shoulder: Secondary | ICD-10-CM | POA: Diagnosis not present

## 2021-12-01 DIAGNOSIS — G4733 Obstructive sleep apnea (adult) (pediatric): Secondary | ICD-10-CM | POA: Diagnosis not present

## 2021-12-01 DIAGNOSIS — J449 Chronic obstructive pulmonary disease, unspecified: Secondary | ICD-10-CM | POA: Diagnosis not present

## 2021-12-01 DIAGNOSIS — G894 Chronic pain syndrome: Secondary | ICD-10-CM | POA: Diagnosis not present

## 2021-12-01 DIAGNOSIS — I4891 Unspecified atrial fibrillation: Secondary | ICD-10-CM | POA: Diagnosis not present

## 2021-12-03 DIAGNOSIS — G4733 Obstructive sleep apnea (adult) (pediatric): Secondary | ICD-10-CM | POA: Diagnosis not present

## 2021-12-10 DIAGNOSIS — M25512 Pain in left shoulder: Secondary | ICD-10-CM | POA: Diagnosis not present

## 2021-12-29 ENCOUNTER — Ambulatory Visit (INDEPENDENT_AMBULATORY_CARE_PROVIDER_SITE_OTHER): Payer: Medicare Other | Admitting: Podiatry

## 2021-12-29 ENCOUNTER — Encounter: Payer: Self-pay | Admitting: Podiatry

## 2021-12-29 DIAGNOSIS — L603 Nail dystrophy: Secondary | ICD-10-CM

## 2021-12-30 NOTE — Progress Notes (Signed)
Subjective:   Patient ID: Kathleen Wright, female   DOB: 56 y.o.   MRN: [id number]   HPI Patient is concerned because the right hallux nail came off and it does not seem to be growing back and this occurred several months ago.  Patient is not having pain currently but is just concerned about appearance and what may happen does not smoke and tries to be active   Review of Systems  All other systems reviewed and are negative.       Objective:  Physical Exam Vitals and nursing note reviewed.  Constitutional:      Appearance: She is well-developed.  Pulmonary:     Effort: Pulmonary effort is normal.  Musculoskeletal:        General: Normal range of motion.  Skin:    General: Skin is warm.  Neurological:     Mental Status: She is alert.     Neurovascular found to be intact muscle strength was found to be adequate range of motion within normal limits.  Patient is found to have a loss of the right hallux nail it is localized crusted over with no indications currently that there is any infection or drainage currently.  Good digital perfusion well oriented     Assessment:  Probability that this is related to some form of trauma and does not appear to be fungal or infected      Plan:  H&P reviewed condition and recommended watching it and hopefully will grow out in a normal fashion over time.  Patient is encouraged to call questions concerns and at this point is discharged but can be seen back as needed or understands ultimately may require permanent nail removal if it were to become damaged or ingrown

## 2022-01-02 DIAGNOSIS — G4733 Obstructive sleep apnea (adult) (pediatric): Secondary | ICD-10-CM | POA: Diagnosis not present

## 2022-01-05 DIAGNOSIS — M47816 Spondylosis without myelopathy or radiculopathy, lumbar region: Secondary | ICD-10-CM | POA: Diagnosis not present

## 2022-01-05 DIAGNOSIS — I10 Essential (primary) hypertension: Secondary | ICD-10-CM | POA: Diagnosis not present

## 2022-01-05 DIAGNOSIS — I4891 Unspecified atrial fibrillation: Secondary | ICD-10-CM | POA: Diagnosis not present

## 2022-01-05 DIAGNOSIS — G894 Chronic pain syndrome: Secondary | ICD-10-CM | POA: Diagnosis not present

## 2022-01-07 DIAGNOSIS — M25512 Pain in left shoulder: Secondary | ICD-10-CM | POA: Diagnosis not present

## 2022-01-10 ENCOUNTER — Other Ambulatory Visit: Payer: Self-pay | Admitting: Orthopedic Surgery

## 2022-01-10 ENCOUNTER — Other Ambulatory Visit (HOSPITAL_COMMUNITY): Payer: Self-pay | Admitting: Orthopedic Surgery

## 2022-01-10 DIAGNOSIS — M25512 Pain in left shoulder: Secondary | ICD-10-CM

## 2022-01-28 ENCOUNTER — Ambulatory Visit (HOSPITAL_COMMUNITY)
Admission: RE | Admit: 2022-01-28 | Discharge: 2022-01-28 | Disposition: A | Discharge status: Home / Self Care | Payer: Medicare Other | Source: Ambulatory Visit | Attending: Orthopedic Surgery | Admitting: Orthopedic Surgery

## 2022-01-28 DIAGNOSIS — M19012 Primary osteoarthritis, left shoulder: Secondary | ICD-10-CM | POA: Diagnosis not present

## 2022-01-28 DIAGNOSIS — M25512 Pain in left shoulder: Secondary | ICD-10-CM | POA: Insufficient documentation

## 2022-02-02 DIAGNOSIS — G4733 Obstructive sleep apnea (adult) (pediatric): Secondary | ICD-10-CM | POA: Diagnosis not present

## 2022-02-03 ENCOUNTER — Other Ambulatory Visit (HOSPITAL_COMMUNITY): Payer: Self-pay | Admitting: Physician Assistant

## 2022-02-03 MED ORDER — DOFETILIDE 250 MCG PO CAPS: 250.0000 ug | ORAL_CAPSULE | Freq: Two times a day (BID) | ORAL | 11 refills | Status: DC

## 2022-02-04 DIAGNOSIS — M25512 Pain in left shoulder: Secondary | ICD-10-CM | POA: Diagnosis not present

## 2022-02-07 DIAGNOSIS — G894 Chronic pain syndrome: Secondary | ICD-10-CM | POA: Diagnosis not present

## 2022-02-07 DIAGNOSIS — M47816 Spondylosis without myelopathy or radiculopathy, lumbar region: Secondary | ICD-10-CM | POA: Diagnosis not present

## 2022-02-07 DIAGNOSIS — I4891 Unspecified atrial fibrillation: Secondary | ICD-10-CM | POA: Diagnosis not present

## 2022-02-07 DIAGNOSIS — I10 Essential (primary) hypertension: Secondary | ICD-10-CM | POA: Diagnosis not present

## 2022-02-17 ENCOUNTER — Telehealth: Payer: Self-pay | Admitting: *Deleted

## 2022-02-17 NOTE — Patient Outreach (Signed)
  Care Coordination   02/17/2022 Name: Kathleen Wright MRN: [id number] DOB: 10/15/1965   Care Coordination Outreach Attempts:  An unsuccessful telephone outreach was attempted today to offer the patient information about available care coordination services as a benefit of their health plan.   Follow Up Plan:  Additional outreach attempts will be made to offer the patient care coordination information and services.   Encounter Outcome:  No Answer  Care Coordination Interventions Activated:  No   Care Coordination Interventions:  No, not indicated   Chong Sicilian, BSN, RN-BC [place] [facility] Pharmacist, community Dial: [phone]

## 2022-02-22 ENCOUNTER — Telehealth: Payer: Self-pay | Admitting: *Deleted

## 2022-02-22 NOTE — Patient Outreach (Signed)
  Care Coordination   02/22/2022 Name: Kathleen Wright MRN: [id number] DOB: 19-Feb-1966   Care Coordination Outreach Attempts:  A second unsuccessful outreach was attempted today to offer the patient with information about available care coordination services as a benefit of their health plan.     Follow Up Plan:  Additional outreach attempts will be made to offer the patient care coordination information and services.   Encounter Outcome:  No Answer  Care Coordination Interventions Activated:  No   Care Coordination Interventions:  No, not indicated    Chong Sicilian, BSN, RN-BC RN Care Coordinator [place]: [phone] Main #: [phone]

## 2022-03-03 DIAGNOSIS — G894 Chronic pain syndrome: Secondary | ICD-10-CM | POA: Diagnosis not present

## 2022-03-03 DIAGNOSIS — M47816 Spondylosis without myelopathy or radiculopathy, lumbar region: Secondary | ICD-10-CM | POA: Diagnosis not present

## 2022-03-03 DIAGNOSIS — I10 Essential (primary) hypertension: Secondary | ICD-10-CM | POA: Diagnosis not present

## 2022-03-03 DIAGNOSIS — J449 Chronic obstructive pulmonary disease, unspecified: Secondary | ICD-10-CM | POA: Diagnosis not present

## 2022-03-03 DIAGNOSIS — I4891 Unspecified atrial fibrillation: Secondary | ICD-10-CM | POA: Diagnosis not present

## 2022-03-05 DIAGNOSIS — G4733 Obstructive sleep apnea (adult) (pediatric): Secondary | ICD-10-CM | POA: Diagnosis not present

## 2022-03-08 ENCOUNTER — Telehealth: Payer: Self-pay | Admitting: *Deleted

## 2022-03-08 NOTE — Patient Outreach (Signed)
  Care Coordination   03/08/2022 Name: Kathleen Wright MRN: [id number] DOB: 1965/08/12   Care Coordination Outreach Attempts:  A third unsuccessful outreach was attempted today to offer the patient with information about available care coordination services as a benefit of their health plan.   Follow Up Plan:  No further outreach attempts will be made at this time. We have been unable to contact the patient to offer or enroll patient in care coordination services  Encounter Outcome:  No Answer  Care Coordination Interventions Activated:  No   Care Coordination Interventions:  No, not indicated    Chong Sicilian, BSN, RN-BC RN Care Coordinator [place]: [phone] Main #: [phone]

## 2022-04-04 DIAGNOSIS — G4733 Obstructive sleep apnea (adult) (pediatric): Secondary | ICD-10-CM | POA: Diagnosis not present

## 2022-04-26 ENCOUNTER — Encounter: Payer: Self-pay | Admitting: Pulmonary Disease

## 2022-04-26 ENCOUNTER — Ambulatory Visit (INDEPENDENT_AMBULATORY_CARE_PROVIDER_SITE_OTHER): Payer: Medicare Other | Admitting: Pulmonary Disease

## 2022-04-26 VITALS — BP 132/80 | HR 58 | Temp 98.3°F | Ht 61.0 in | Wt 199.4 lb

## 2022-04-26 DIAGNOSIS — G4733 Obstructive sleep apnea (adult) (pediatric): Secondary | ICD-10-CM | POA: Diagnosis not present

## 2022-04-26 DIAGNOSIS — J4489 Other specified chronic obstructive pulmonary disease: Secondary | ICD-10-CM

## 2022-04-26 NOTE — Patient Instructions (Signed)
Follow up in 6 months 

## 2022-04-26 NOTE — Progress Notes (Signed)
[facility]  Chief Complaint  Patient presents with   Follow-up    Cpap working well but she does not like the mask. She states it makes the inside of her nose tender    Past Surgical History:  She  has a past surgical history that includes Septal myomectomy (06/13/2004); Tubal ligation; Cardioversion (N/A, 08/12/2015); TEE without cardioversion (N/A, 03/29/2016); TEE without cardioversion (N/A, 06/23/2016); Cardioversion (N/A, 08/24/2020); transesophageal echocardiogram (2005); and Cardioversion (N/A, 04/13/2021).  Past Medical History:  Asthma, Cervical cancer, COPD, Hypertrophic cardiomyopathy, PAF, HTN  Constitutional:  BP 132/80   Pulse (!) 58   Temp 98.3 F (36.8 C) (Oral)   Ht '5\' 1"'$  (1.549 m)   Wt 199 lb 6.4 oz (90.4 kg)   SpO2 98% Comment: ra  BMI 37.68 kg/m   Brief Summary:  Kathleen Wright is a 56 y.o. female former smoker with obstructive sleep apnea and COPD.      Subjective:   Stiolto helps.  She will cough up phlegm after using sometimes and then her breathing and wheezing get better.  She uses CPAP nightly.  Mask causes irritation sometimes, but not too bad.  Pressure is okay.  Feels rested.  Physical Exam:   Appearance - well kempt   ENMT - no sinus tenderness, no oral exudate, no LAN, Mallampati 3 airway, no stridor, wears dentures  Respiratory - equal breath sounds bilaterally, no wheezing or rales  CV - s1s2 regular rate and rhythm, no murmurs  Ext - no clubbing, no edema  Skin - no rashes  Psych - normal mood and affect    Pulmonary testing:  ABG with 32% FiO2 03/02/21 >> pH 7.29, PCO2 64, PO2 81.3 A1AT 04/01/21 >> 203, MM IgE 04/01/21 >> 11 PFT 08/10/21 >> FEV1 0.87 (36%), FEV1% 53, TLC 4.98 (108%), DLCO 46%  Chest Imaging:  CT angio chest 02/28/21 >> bronchial wall thickening, multifocal pneumonia  Sleep Tests:  HST 07/29/21 >> AHI 6.1, SpO2 low 77% Auto CPAP 03/26/22 to 04/24/22 >> used on  29 of 30 nights with average 5 hrs 6 min.  Average AHI 0.9 with median CPAP 7 and 95 th percentile CPAP 9 cm H2O  Cardiac Tests:  Echo 02/28/21 >> EF 65 to 70%, mod LVH, grade 3 DD, RVSP 43.4 mmHg, severe LA dilation, mod RA dilation, mod pericardial effusion, mod MR, mild/mod TR  Social History:  She  reports that she quit smoking about 16 months ago. Her smoking use included cigarettes. She smoked an average of .5 packs per day. She has never used smokeless tobacco. She reports that she does not drink alcohol and does not use drugs.  Family History:  Her family history includes Diabetes in an other family member; Heart failure in her father; Lung disease in an other family member.     Assessment/Plan:   COPD with chronic bronchitis. - she didn't tolerate breztri or breo  - continue stiolto - prn albuterol  Obstructive sleep apnea. - she is compliant with CPAP and reports benefit from therapy - uses [facility] for her DME - current CPAP ordered March 2023 - continue auto CPAP 5 to 15 cm H2O - she will look up CPAP mask options on-line  Atrial fibrillation, valvular heart disease. - followed by Dr. Candee Furbish with [facility]  Obesity. - discussed how weight can impact sleep and risk for sleep disordered breathing - discussed options to assist with weight loss: combination of diet modification,  cardiovascular and strength training exercises  Time Spent Involved in Patient Care on Day of Examination:  26 minutes  Follow up:   Patient Instructions  Follow up in 6 months  Medication List:   Allergies as of 04/26/2022       Reactions   Amiodarone Other (See Comments)   Liver function        Medication List        Accurate as of April 26, 2022 12:08 PM. If you have any questions, ask your nurse or doctor.          aspirin EC 81 MG tablet Take 81 mg by mouth daily.   dabigatran 150 MG Caps capsule Commonly known as: Pradaxa Take 1 capsule  (150 mg total) by mouth 2 (two) times daily.   docusate sodium 100 MG capsule Commonly known as: COLACE Take 100 mg by mouth daily as needed for mild constipation.   dofetilide 250 MCG capsule Commonly known as: TIKOSYN Take 1 capsule (250 mcg total) by mouth 2 (two) times daily.   HYDROcodone-acetaminophen 10-325 MG tablet Commonly known as: NORCO Take 1 tablet by mouth every 4 (four) hours.   hydroxypropyl methylcellulose / hypromellose 2.5 % ophthalmic solution Commonly known as: ISOPTO TEARS / GONIOVISC Place 1 drop into both eyes 2 (two) times daily as needed for dry eyes.   Magnesium Oxide -Mg Supplement 200 MG Tabs Commonly known as: Mag-Oxide Take 1 tablet (200 mg total) by mouth daily. What changed:  how much to take additional instructions   metoprolol tartrate 50 MG tablet Commonly known as: LOPRESSOR Take 1 tablet (50 mg total) by mouth 2 (two) times daily.   omeprazole 40 MG capsule Commonly known as: PRILOSEC Take 40 mg by mouth daily.   potassium chloride 10 MEQ tablet Commonly known as: KLOR-CON Take 4 tablets (40 mEq total) by mouth daily.   ProAir HFA 108 (90 Base) MCG/ACT inhaler Generic drug: albuterol Inhale 2 puffs into the lungs every 6 (six) hours as needed for wheezing or shortness of breath.   albuterol (2.5 MG/3ML) 0.083% nebulizer solution Commonly known as: PROVENTIL Take 3 mLs (2.5 mg total) by nebulization every 6 (six) hours as needed for wheezing or shortness of breath.   Stiolto Respimat 2.5-2.5 MCG/ACT Aers Generic drug: Tiotropium Bromide-Olodaterol Inhale 2 puffs into the lungs daily.   torsemide 20 MG tablet Commonly known as: Demadex Take 2 tablets (40 mg total) by mouth daily.   VITAMIN C PO Take 1 tablet by mouth daily.   VITAMIN D3 PO Take 1 tablet by mouth daily.        Signature:  Chesley Mires, MD [place] Pager - [phone] 04/26/2022, 12:08 PM

## 2022-05-04 DIAGNOSIS — M1991 Primary osteoarthritis, unspecified site: Secondary | ICD-10-CM | POA: Diagnosis not present

## 2022-05-04 DIAGNOSIS — J449 Chronic obstructive pulmonary disease, unspecified: Secondary | ICD-10-CM | POA: Diagnosis not present

## 2022-05-04 DIAGNOSIS — I4891 Unspecified atrial fibrillation: Secondary | ICD-10-CM | POA: Diagnosis not present

## 2022-05-04 DIAGNOSIS — G894 Chronic pain syndrome: Secondary | ICD-10-CM | POA: Diagnosis not present

## 2022-05-05 DIAGNOSIS — G4733 Obstructive sleep apnea (adult) (pediatric): Secondary | ICD-10-CM | POA: Diagnosis not present

## 2022-06-03 ENCOUNTER — Other Ambulatory Visit: Payer: Self-pay | Admitting: Cardiology

## 2022-06-03 ENCOUNTER — Other Ambulatory Visit: Payer: Self-pay | Admitting: Nurse Practitioner

## 2022-06-03 DIAGNOSIS — J449 Chronic obstructive pulmonary disease, unspecified: Secondary | ICD-10-CM

## 2022-06-04 DIAGNOSIS — G4733 Obstructive sleep apnea (adult) (pediatric): Secondary | ICD-10-CM | POA: Diagnosis not present

## 2022-06-29 DIAGNOSIS — G894 Chronic pain syndrome: Secondary | ICD-10-CM | POA: Diagnosis not present

## 2022-06-29 DIAGNOSIS — J449 Chronic obstructive pulmonary disease, unspecified: Secondary | ICD-10-CM | POA: Diagnosis not present

## 2022-06-29 DIAGNOSIS — M47816 Spondylosis without myelopathy or radiculopathy, lumbar region: Secondary | ICD-10-CM | POA: Diagnosis not present

## 2022-06-29 DIAGNOSIS — I480 Paroxysmal atrial fibrillation: Secondary | ICD-10-CM | POA: Diagnosis not present

## 2022-07-05 DIAGNOSIS — G4733 Obstructive sleep apnea (adult) (pediatric): Secondary | ICD-10-CM | POA: Diagnosis not present

## 2022-07-15 ENCOUNTER — Encounter: Payer: Self-pay | Admitting: *Deleted

## 2022-07-21 ENCOUNTER — Encounter (HOSPITAL_COMMUNITY): Payer: Self-pay | Admitting: *Deleted

## 2022-08-05 DIAGNOSIS — G4733 Obstructive sleep apnea (adult) (pediatric): Secondary | ICD-10-CM | POA: Diagnosis not present

## 2022-08-15 ENCOUNTER — Encounter: Payer: Self-pay | Admitting: Pulmonary Disease

## 2022-08-30 ENCOUNTER — Telehealth: Payer: Self-pay | Admitting: Pulmonary Disease

## 2022-08-30 NOTE — Telephone Encounter (Signed)
Patient will need to f/u with PCP or Dr. Quentin Ore who prescribed.  Will route to both for patient.

## 2022-08-30 NOTE — Telephone Encounter (Signed)
PT called in to reschd her appt w/Dr. Halford Chessman.  If we are the practice that put her on the heart pill Tikosin . PT states she is in AFIB and she wanted to talk to a nurse. Perhaps needs medication increase, she said. Her heart rate has been good until know.

## 2022-09-06 DIAGNOSIS — J449 Chronic obstructive pulmonary disease, unspecified: Secondary | ICD-10-CM | POA: Diagnosis not present

## 2022-09-06 DIAGNOSIS — G894 Chronic pain syndrome: Secondary | ICD-10-CM | POA: Diagnosis not present

## 2022-09-06 DIAGNOSIS — J069 Acute upper respiratory infection, unspecified: Secondary | ICD-10-CM | POA: Diagnosis not present

## 2022-09-20 ENCOUNTER — Ambulatory Visit (HOSPITAL_COMMUNITY)
Admission: RE | Admit: 2022-09-20 | Discharge: 2022-09-20 | Disposition: A | Discharge status: Home / Self Care | Payer: 59 | Source: Ambulatory Visit | Attending: Physician Assistant | Admitting: Physician Assistant

## 2022-09-20 ENCOUNTER — Encounter (HOSPITAL_COMMUNITY): Payer: Self-pay | Admitting: Physician Assistant

## 2022-09-20 VITALS — BP 126/80 | HR 59 | Ht 61.0 in | Wt 211.6 lb

## 2022-09-20 DIAGNOSIS — I4892 Unspecified atrial flutter: Secondary | ICD-10-CM | POA: Diagnosis not present

## 2022-09-20 DIAGNOSIS — I440 Atrioventricular block, first degree: Secondary | ICD-10-CM | POA: Insufficient documentation

## 2022-09-20 DIAGNOSIS — I480 Paroxysmal atrial fibrillation: Secondary | ICD-10-CM | POA: Diagnosis not present

## 2022-09-20 DIAGNOSIS — Z5181 Encounter for therapeutic drug level monitoring: Secondary | ICD-10-CM | POA: Diagnosis not present

## 2022-09-20 DIAGNOSIS — I421 Obstructive hypertrophic cardiomyopathy: Secondary | ICD-10-CM | POA: Diagnosis not present

## 2022-09-20 DIAGNOSIS — Z79899 Other long term (current) drug therapy: Secondary | ICD-10-CM | POA: Insufficient documentation

## 2022-09-20 DIAGNOSIS — I5032 Chronic diastolic (congestive) heart failure: Secondary | ICD-10-CM | POA: Diagnosis not present

## 2022-09-20 LAB — BASIC METABOLIC PANEL
Anion gap: 7 (ref 5–15)
BUN: 12 mg/dL (ref 6–20)
CO2: 24 mmol/L (ref 22–32)
Calcium: 8.5 mg/dL — ABNORMAL LOW (ref 8.9–10.3)
Chloride: 107 mmol/L (ref 98–111)
Creatinine, Ser: 0.89 mg/dL (ref 0.44–1.00)
GFR, Estimated: >60 mL/min (ref >60)
Glucose, Bld: 103 mg/dL — ABNORMAL HIGH (ref 70–99)
Potassium: 4.6 mmol/L (ref 3.5–5.1)
Sodium: 138 mmol/L (ref 135–145)

## 2022-09-20 LAB — MAGNESIUM: Magnesium: 2.1 mg/dL (ref 1.7–2.4)

## 2022-09-20 NOTE — Progress Notes (Signed)
Primary Care Physician: Elfredia NevinsFusco, Lawrence, MD Referring Physician: Dr. Ancil LinseyLambert    Kathleen Wright is a 57 y.o. female with a h/o a h/o persistent afib, HOCM, s/p [facility]ib [facility] July of  2018. Patient is s/p dofetilide loading 12/27-12/30/22. She presented in SR and did not require DCCV. Her dose had to be down titrated due to a short episode of WCT.   On follow up today, patient reports that she has done well overall since her last visit. She did have two episodes of afib that lasted roughly two hours each. There did not appear to be any specific trigger that she could identify. No bleeding issues on anticoagulation. Patient did stop her KCL supplement one and a half weeks ago due to GI side effects.   Today, she denies symptoms of palpitations, chest pain, shortness of breath, orthopnea, PND, lower extremity edema, dizziness, presyncope, syncope, or neurologic sequela. The patient is tolerating medications without difficulties and is otherwise without complaint today.   Past Medical History:  Diagnosis Date   Asthma    Cervical cancer    a. treated with partial removal of cervix   COPD (chronic obstructive pulmonary disease)    Hypertrophic cardiomyopathy    a. [facility]/p [facility]   Paroxysmal atrial flutter    a. post-op after myomectomy in 2006. s/p TEE/DCCV.   Sleep apnea    Transient atrial fibrillation 2006   a. after [facility] in 08/[facility] - was on amiodarone but developed elevated LFTs felt possibly secondary to amiodarone    Past Surgical History:  Procedure Laterality Date   CARDIOVERSION N/A 08/12/2015   Procedure: CARDIOVERSION;  Surgeon: Jonelle SidleSamuel G McDowell, MD;  Location[facility] ENDO [facility];  Service: Cardiovascular;  Laterality: N/A;   CARDIOVERSION N/A 08/24/2020   Procedure: CARDIOVERSION;  Surgeon: Jake BatheSkains, Mark C, MD;  [facility];  Service: Cardiovascular;  Laterality: N/A;   CARDIOVERSION N/A 04/13/2021   Procedure:  CARDIOVERSION;  Surgeon: Jonelle SidleMcDowell, Samuel G, MD;  Location: [facility] ORS;  Service: Cardiovascular;  Laterality: N/A;   Septal myomectomy  06/13/2004   [facility]   TEE WITHOUT CARDIOVERSION N/A 03/29/2016   Procedure: TRANSESOPHAGEAL ECHOCARDIOGRAM (TEE);  Surgeon: Lewayne BuntingBrian S Crenshaw, MD;  [facility];  Service: Cardiovascular;  Laterality: N/A;   TEE WITHOUT CARDIOVERSION N/A 06/23/2016   Procedure: TRANSESOPHAGEAL ECHOCARDIOGRAM (TEE);  Surgeon: Vesta MixerPhilip J Nahser, MD;  [facility];  Service: Cardiovascular;  Laterality: N/A;   TRANSESOPHAGEAL ECHOCARDIOGRAM  2005   TUBAL LIGATION      Current Outpatient Medications  Medication Sig Dispense Refill   Ascorbic Acid (VITAMIN C PO) Take 1 tablet by mouth daily.     aspirin EC 81 MG tablet Take 81 mg by mouth daily.     Cholecalciferol (VITAMIN D3 PO) Take 1 tablet by mouth daily.     dabigatran (PRADAXA) 150 MG CAPS capsule Take 1 capsule (150 mg total) by mouth 2 (two) times daily. 60 capsule 6   docusate sodium (COLACE) 100 MG capsule Take 100 mg by mouth daily as needed for mild constipation.     dofetilide (TIKOSYN) 250 MCG capsule Take 1 capsule (250 mcg total) by mouth 2 (two) times daily. 60 capsule 11   HYDROcodone-acetaminophen (NORCO) 10-325 MG tablet Take 1 tablet by mouth every 4 (four) hours.     hydroxypropyl methylcellulose / hypromellose (ISOPTO TEARS / GONIOVISC) 2.5 % ophthalmic solution Place 1 drop into both eyes 2 (two) times daily as  needed for dry eyes.     Magnesium Oxide (MAG-OXIDE) 200 MG TABS Take 1 tablet (200 mg total) by mouth daily. (Patient taking differently: Take 250 mg by mouth daily. Taking 250mg  by mouth daily) 90 tablet 1   metoprolol tartrate (LOPRESSOR) 50 MG tablet Take 1 tablet (50 mg total) by mouth 2 (two) times daily. 60 tablet 6   omeprazole (PRILOSEC) 40 MG capsule Take 40 mg by mouth daily.     potassium chloride (KLOR-CON) 10 MEQ tablet Take 4 tablets (40 mEq total) by mouth daily. 360  tablet 3   PROAIR HFA 108 (90 Base) MCG/ACT inhaler Inhale 2 puffs into the lungs every 6 (six) hours as needed for wheezing or shortness of breath.      STIOLTO RESPIMAT 2.5-2.5 MCG/ACT AERS INHALE 2 PUFFS INTO THE LUNGS DAILY. 4 g 0   torsemide (DEMADEX) 20 MG tablet TAKE 2 TABLETS BY MOUTH DAILY. 180 tablet 1   albuterol (PROVENTIL) (2.5 MG/3ML) 0.083% nebulizer solution Take 3 mLs (2.5 mg total) by nebulization every 6 (six) hours as needed for wheezing or shortness of breath. 75 mL 5   No current facility-administered medications for this encounter.    Allergies  Allergen Reactions   Amiodarone Other (See Comments)    Liver function    Social History   Socioeconomic History   Marital status: Widowed    Spouse name: Not on file   Number of children: Not on file   Years of education: Not on file   Highest education level: Not on file  Occupational History   Occupation: Housekeeper  Tobacco Use   Smoking status: Former    Packs/day: .5    Types: Cigarettes    Quit date: 12/11/2020    Years since quitting: 1.7   Smokeless tobacco: Never   Tobacco comments:    Former smoker 09/20/22  Vaping Use   Vaping Use: Never used  Substance and Sexual Activity   Alcohol use: No    Alcohol/week: 0.0 standard drinks of alcohol   Drug use: No   Sexual activity: Not on file  Other Topics Concern   Not on file  Social History Narrative   Not on file   Social Determinants of Health   Financial Resource Strain: Not on file  Food Insecurity: Not on file  Transportation Needs: Not on file  Physical Activity: Not on file  Stress: Not on file  Social Connections: Not on file  Intimate Partner Violence: Not on file    Family History  Problem Relation Age of Onset   Heart failure Father    Diabetes Other    Lung disease Other     ROS- All systems are reviewed and negative except as per the HPI above  Physical Exam: Vitals:   09/20/22 0955  BP: 126/80  Pulse: (!) 59  SpO2:  96%  Weight: 96 kg  Height: 5\' 1"  (1.549 m)    Wt Readings from Last 3 Encounters:  09/20/22 96 kg  04/26/22 90.4 kg  11/22/21 86.2 kg    Labs: Lab Results  Component Value Date   NA 138 09/20/2022   K 4.6 09/20/2022   CL 107 09/20/2022   CO2 24 09/20/2022   GLUCOSE 103 (H) 09/20/2022   BUN 12 09/20/2022   CREATININE 0.89 09/20/2022   CALCIUM 8.5 (L) 09/20/2022   MG 2.1 09/20/2022   Lab Results  Component Value Date   INR 1.7 (H) 04/09/2021   No results found for: "CHOL", "  HDL", "LDLCALC", "TRIG"   GEN- The patient is a well appearing female, alert and oriented x 3 today.   HEENT-head normocephalic, atraumatic, sclera clear, conjunctiva pink, hearing intact, trachea midline. Lungs- Clear to ausculation bilaterally, normal work of breathing Heart- Regular rate and rhythm, no murmurs, rubs or gallops  GI- soft, NT, ND, + BS Extremities- no clubbing, cyanosis, or edema MS- no significant deformity or atrophy Skin- no rash or lesion Psych- euthymic mood, full affect Neuro- strength and sensation are intact    EKG today demonstrates SB, 1st degree AV block, LVH Vent. rate 59 BPM PR interval 238 ms QRS duration 120 ms QT/QTcB 486/481 ms   CHA2DS2-VASc Score = 2  The patient's score is based upon: CHF History: 1 HTN History: 0 Diabetes History: 0 Stroke History: 0 Vascular Disease History: 0 Age Score: 0 Gender Score: 1       ASSESSMENT AND PLAN: 1. Paroxysmal Atrial Fibrillation/atrial flutter The patient's CHA2DS2-VASc score is 2, indicating a 2.2% annual risk of stroke.   S/p dofetilide loading 12/27-12/30/22 Patient appears to be maintaining SR with brief, infrequent episodes.  Continue dofetilide 250 mcg BID. QT stable.  Check bmet/mag today. Continue pradaxa 150 mg BID  2. HOCM S/p myomectomy  3. Chronic HFpEF Fluid status appears stable today.    Follow up in the AF clinic in 6 months for dofetilide monitoring.    Jorja Loa  PA-C [facility] [address] [place], [place] [address] [phone]

## 2022-09-23 ENCOUNTER — Other Ambulatory Visit (HOSPITAL_COMMUNITY): Payer: Self-pay | Admitting: *Deleted

## 2022-09-23 MED ORDER — POTASSIUM CHLORIDE ER 10 MEQ PO TBCR: 10.0000 meq | EXTENDED_RELEASE_TABLET | Freq: Every day | ORAL | 6 refills | Status: DC

## 2022-10-03 DIAGNOSIS — I10 Essential (primary) hypertension: Secondary | ICD-10-CM | POA: Diagnosis not present

## 2022-10-03 DIAGNOSIS — J449 Chronic obstructive pulmonary disease, unspecified: Secondary | ICD-10-CM | POA: Diagnosis not present

## 2022-10-03 DIAGNOSIS — M1991 Primary osteoarthritis, unspecified site: Secondary | ICD-10-CM | POA: Diagnosis not present

## 2022-10-03 DIAGNOSIS — G894 Chronic pain syndrome: Secondary | ICD-10-CM | POA: Diagnosis not present

## 2022-11-08 ENCOUNTER — Ambulatory Visit: Payer: Medicare Other | Admitting: Pulmonary Disease

## 2022-11-11 ENCOUNTER — Encounter: Payer: Self-pay | Admitting: Pulmonary Disease

## 2022-11-11 ENCOUNTER — Ambulatory Visit (INDEPENDENT_AMBULATORY_CARE_PROVIDER_SITE_OTHER): Payer: 59 | Admitting: Pulmonary Disease

## 2022-11-11 VITALS — BP 94/64 | HR 51 | Ht 61.0 in | Wt 201.4 lb

## 2022-11-11 DIAGNOSIS — J449 Chronic obstructive pulmonary disease, unspecified: Secondary | ICD-10-CM

## 2022-11-11 DIAGNOSIS — G4733 Obstructive sleep apnea (adult) (pediatric): Secondary | ICD-10-CM | POA: Diagnosis not present

## 2022-11-11 DIAGNOSIS — J301 Allergic rhinitis due to pollen: Secondary | ICD-10-CM | POA: Diagnosis not present

## 2022-11-11 MED ORDER — FLUTICASONE PROPIONATE 50 MCG/ACT NA SUSP: 1.0000 | Freq: Every day | NASAL | 2 refills | Status: DC | PRN

## 2022-11-11 NOTE — Progress Notes (Signed)
  CPAP DL for OV 11/11/2022 

## 2022-11-11 NOTE — Progress Notes (Signed)
[facility]  Chief Complaint  Patient presents with   Follow-up    Pt f/u     Past Surgical History:  She  has a past surgical history that includes Septal myomectomy (06/13/2004); Tubal ligation; Cardioversion (N/A, 08/12/2015); TEE without cardioversion (N/A, 03/29/2016); TEE without cardioversion (N/A, 06/23/2016); Cardioversion (N/A, 08/24/2020); transesophageal echocardiogram (2005); and Cardioversion (N/A, 04/13/2021).  Past Medical History:  Asthma, Cervical cancer, COPD, Hypertrophic cardiomyopathy, PAF, HTN  Constitutional:  BP 94/64   Pulse (!) 51   Ht 5\' 1"  (1.549 m)   Wt 201 lb 6.4 oz (91.4 kg)   SpO2 92%   BMI 38.05 kg/m   Brief Summary:  Kathleen Wright is a 57 y.o. female former smoker with obstructive sleep apnea and COPD.      Subjective:   Uses albuterol once or twice per day.  Has occasional cough with clear sputum.  Not having wheeze.  Keeps up with activity okay.  Not having chest pain or leg swelling.  Sleeping okay.  No issues with CPAP mask or pressure setting.  Has more trouble with allergies this time of year.  Hasn't been using anything for this.  Physical Exam:   Appearance - well kempt   ENMT - no sinus tenderness, no oral exudate, no LAN, Mallampati 3 airway, no stridor, wears dentures  Respiratory - equal breath sounds bilaterally, no wheezing or rales  CV - s1s2 regular rate and rhythm, no murmurs  Ext - no clubbing, no edema  Skin - no rashes  Psych - normal mood and affect     Pulmonary testing:  ABG with 32% FiO2 03/02/21 >> pH 7.29, PCO2 64, PO2 81.3 A1AT 04/01/21 >> 203, MM IgE 04/01/21 >> 11 PFT 08/10/21 >> FEV1 0.87 (36%), FEV1% 53, TLC 4.98 (108%), DLCO 46%  Chest Imaging:  CT angio chest 02/28/21 >> bronchial wall thickening, multifocal pneumonia  Sleep Tests:  HST 07/29/21 >> AHI 6.1, SpO2 low 77% Auto CPAP 03/26/22 to 04/24/22 >> used on 29 of 30 nights with average 5 hrs 6  min.  Average AHI 0.9 with median CPAP 7 and 95 th percentile CPAP 9 cm H2O  Cardiac Tests:  Echo 02/28/21 >> EF 65 to 70%, mod LVH, grade 3 DD, RVSP 43.4 mmHg, severe LA dilation, mod RA dilation, mod pericardial effusion, mod MR, mild/mod TR  Social History:  She  reports that she quit smoking about 23 months ago. Her smoking use included cigarettes. She smoked an average of .5 packs per day. She has never used smokeless tobacco. She reports that she does not drink alcohol and does not use drugs.  Family History:  Her family history includes Diabetes in an other family member; Heart failure in her father; Lung disease in an other family member.     Assessment/Plan:   COPD with chronic bronchitis. - she didn't tolerate breztri or breo  - continue stiolto 2.5 mcg two puffs daily - prn albuterol  Obstructive sleep apnea. - she is compliant with CPAP and reports benefit from therapy - uses [facility] for her DME - current CPAP ordered March 2023 - continue auto CPAP 5 to 15 cm H2O  Allergic rhinitis. - she can try OTC fluticasone nose spray prn  Atrial fibrillation, valvular heart disease. - followed by Dr. Donato Schultz with [facility]  Time Spent Involved in Patient Care on Day of Examination:  25 minutes  Follow up:   Patient Instructions  Follow  up in 1 year  Medication List:   Allergies as of 11/11/2022       Reactions   Amiodarone Other (See Comments)   Liver function        Medication List        Accurate as of Nov 11, 2022 10:38 AM. If you have any questions, ask your nurse or doctor.          aspirin EC 81 MG tablet Take 81 mg by mouth daily.   dabigatran 150 MG Caps capsule Commonly known as: Pradaxa Take 1 capsule (150 mg total) by mouth 2 (two) times daily.   docusate sodium 100 MG capsule Commonly known as: COLACE Take 100 mg by mouth daily as needed for mild constipation.   dofetilide 250 MCG capsule Commonly known as:  TIKOSYN Take 1 capsule (250 mcg total) by mouth 2 (two) times daily.   fluticasone 50 MCG/ACT nasal spray Commonly known as: FLONASE Place 1 spray into both nostrils daily as needed for allergies or rhinitis. Started by: Coralyn Helling, MD   HYDROcodone-acetaminophen 10-325 MG tablet Commonly known as: NORCO Take 1 tablet by mouth every 4 (four) hours.   hydroxypropyl methylcellulose / hypromellose 2.5 % ophthalmic solution Commonly known as: ISOPTO TEARS / GONIOVISC Place 1 drop into both eyes 2 (two) times daily as needed for dry eyes.   Magnesium Oxide -Mg Supplement 200 MG Tabs Commonly known as: Mag-Oxide Take 1 tablet (200 mg total) by mouth daily. What changed:  how much to take additional instructions   metoprolol tartrate 50 MG tablet Commonly known as: LOPRESSOR Take 1 tablet (50 mg total) by mouth 2 (two) times daily.   omeprazole 40 MG capsule Commonly known as: PRILOSEC Take 40 mg by mouth daily.   potassium chloride 10 MEQ tablet Commonly known as: KLOR-CON Take 1 tablet (10 mEq total) by mouth daily.   ProAir HFA 108 (90 Base) MCG/ACT inhaler Generic drug: albuterol Inhale 2 puffs into the lungs every 6 (six) hours as needed for wheezing or shortness of breath.   albuterol (2.5 MG/3ML) 0.083% nebulizer solution Commonly known as: PROVENTIL Take 3 mLs (2.5 mg total) by nebulization every 6 (six) hours as needed for wheezing or shortness of breath.   Stiolto Respimat 2.5-2.5 MCG/ACT Aers Generic drug: Tiotropium Bromide-Olodaterol INHALE 2 PUFFS INTO THE LUNGS DAILY.   torsemide 20 MG tablet Commonly known as: DEMADEX TAKE 2 TABLETS BY MOUTH DAILY.   VITAMIN C PO Take 1 tablet by mouth daily.   VITAMIN D3 PO Take 1 tablet by mouth daily.        Signature:  Coralyn Helling, MD [facility] Pulmonary/Critical Care Pager - [phone] 11/11/2022, 10:38 AM

## 2022-11-11 NOTE — Patient Instructions (Signed)
Follow up in 1 year.

## 2022-11-16 DIAGNOSIS — G4733 Obstructive sleep apnea (adult) (pediatric): Secondary | ICD-10-CM | POA: Diagnosis not present

## 2022-12-05 DIAGNOSIS — I10 Essential (primary) hypertension: Secondary | ICD-10-CM | POA: Diagnosis not present

## 2022-12-05 DIAGNOSIS — G894 Chronic pain syndrome: Secondary | ICD-10-CM | POA: Diagnosis not present

## 2022-12-05 DIAGNOSIS — J449 Chronic obstructive pulmonary disease, unspecified: Secondary | ICD-10-CM | POA: Diagnosis not present

## 2022-12-05 DIAGNOSIS — M1991 Primary osteoarthritis, unspecified site: Secondary | ICD-10-CM | POA: Diagnosis not present

## 2023-01-31 ENCOUNTER — Other Ambulatory Visit (HOSPITAL_COMMUNITY): Payer: Self-pay | Admitting: Cardiology

## 2023-02-03 DIAGNOSIS — J449 Chronic obstructive pulmonary disease, unspecified: Secondary | ICD-10-CM | POA: Diagnosis not present

## 2023-02-03 DIAGNOSIS — M1991 Primary osteoarthritis, unspecified site: Secondary | ICD-10-CM | POA: Diagnosis not present

## 2023-02-03 DIAGNOSIS — I10 Essential (primary) hypertension: Secondary | ICD-10-CM | POA: Diagnosis not present

## 2023-02-03 DIAGNOSIS — G894 Chronic pain syndrome: Secondary | ICD-10-CM | POA: Diagnosis not present

## 2023-02-03 DIAGNOSIS — I4891 Unspecified atrial fibrillation: Secondary | ICD-10-CM | POA: Diagnosis not present

## 2023-02-03 DIAGNOSIS — M47816 Spondylosis without myelopathy or radiculopathy, lumbar region: Secondary | ICD-10-CM | POA: Diagnosis not present

## 2023-03-02 ENCOUNTER — Other Ambulatory Visit (HOSPITAL_COMMUNITY): Payer: Self-pay | Admitting: Cardiology

## 2023-03-02 NOTE — Telephone Encounter (Signed)
This is a A-Fib clinic pt

## 2023-03-22 ENCOUNTER — Ambulatory Visit (HOSPITAL_COMMUNITY)
Admission: RE | Admit: 2023-03-22 | Discharge: 2023-03-22 | Disposition: A | Discharge status: Home / Self Care | Payer: 59 | Source: Ambulatory Visit | Attending: Physician Assistant | Admitting: Physician Assistant

## 2023-03-22 VITALS — BP 114/66 | HR 47 | Ht 61.0 in | Wt 206.4 lb

## 2023-03-22 DIAGNOSIS — I480 Paroxysmal atrial fibrillation: Secondary | ICD-10-CM | POA: Insufficient documentation

## 2023-03-22 DIAGNOSIS — Z79899 Other long term (current) drug therapy: Secondary | ICD-10-CM | POA: Insufficient documentation

## 2023-03-22 DIAGNOSIS — G4733 Obstructive sleep apnea (adult) (pediatric): Secondary | ICD-10-CM | POA: Insufficient documentation

## 2023-03-22 DIAGNOSIS — Z7902 Long term (current) use of antithrombotics/antiplatelets: Secondary | ICD-10-CM | POA: Insufficient documentation

## 2023-03-22 DIAGNOSIS — I5032 Chronic diastolic (congestive) heart failure: Secondary | ICD-10-CM | POA: Insufficient documentation

## 2023-03-22 DIAGNOSIS — I421 Obstructive hypertrophic cardiomyopathy: Secondary | ICD-10-CM | POA: Insufficient documentation

## 2023-03-22 DIAGNOSIS — I4892 Unspecified atrial flutter: Secondary | ICD-10-CM | POA: Insufficient documentation

## 2023-03-22 LAB — BASIC METABOLIC PANEL
Anion gap: 10 (ref 5–15)
BUN: 14 mg/dL (ref 6–20)
CO2: 25 mmol/L (ref 22–32)
Calcium: 8.9 mg/dL (ref 8.9–10.3)
Chloride: 107 mmol/L (ref 98–111)
Creatinine, Ser: 1.06 mg/dL — ABNORMAL HIGH (ref 0.44–1.00)
GFR, Estimated: >60 mL/min (ref >60)
Glucose, Bld: 109 mg/dL — ABNORMAL HIGH (ref 70–99)
Potassium: 5.1 mmol/L (ref 3.5–5.1)
Sodium: 142 mmol/L (ref 135–145)

## 2023-03-22 LAB — MAGNESIUM: Magnesium: 2.1 mg/dL (ref 1.7–2.4)

## 2023-03-22 MED ORDER — METOPROLOL TARTRATE 25 MG PO TABS: 25.0000 mg | ORAL_TABLET | Freq: Two times a day (BID) | ORAL | 6 refills | Status: DC

## 2023-03-22 NOTE — Progress Notes (Signed)
Primary Care Physician: Elfredia Nevins, MD Referring Physician: Dr. Lalla Brothers  Primary Cardiologist: Dr Anne Fu Primary EP: Dr Ancil Linsey is a 57 y.o. female with a h/o a h/o persistent afib, HOCM, s/p afib ablation at [facility] in July of  2018. Patient is s/p dofetilide loading 12/27-12/30/22. She presented in SR and did not require DCCV. Her dose had to be down titrated due to a short episode of WCT.   On follow up today, patient reports that she has done reasonably well since her last visit. She has had 3-4 episodes of afib, each lasting about 2 hours. No bleeding issues on anticoagulation. She has noticed slow HR at times in the 40's bpm associated with dizziness, no presyncope or falls.   Today, she denies symptoms of palpitations, chest pain, shortness of breath, orthopnea, PND, lower extremity edema, presyncope, syncope, or neurologic sequela. The patient is tolerating medications without difficulties and is otherwise without complaint today.   Past Medical History:  Diagnosis Date   Asthma    Cervical cancer (HCC)    a. treated with partial removal of cervix   COPD (chronic obstructive pulmonary disease) (HCC)    Hypertrophic cardiomyopathy (HCC)    a. s/p myomectomy at [facility] 08/2004   Paroxysmal atrial flutter (HCC)    a. post-op after myomectomy in 2006. s/p TEE/DCCV.   Sleep apnea    Transient atrial fibrillation (HCC) 2006   a. after myomectomy at [facility] in 08/2004 - was on amiodarone but developed elevated LFTs felt possibly secondary to amiodarone     Current Outpatient Medications  Medication Sig Dispense Refill   albuterol (PROVENTIL) (2.5 MG/3ML) 0.083% nebulizer solution Take 3 mLs (2.5 mg total) by nebulization every 6 (six) hours as needed for wheezing or shortness of breath. 75 mL 5   Ascorbic Acid (VITAMIN C PO) Take 1 tablet by mouth daily.     aspirin EC 81 MG tablet Take 81 mg by mouth daily.     Cholecalciferol (VITAMIN D3 PO) Take 1  tablet by mouth daily.     dabigatran (PRADAXA) 150 MG CAPS capsule Take 1 capsule (150 mg total) by mouth 2 (two) times daily. 60 capsule 6   docusate sodium (COLACE) 100 MG capsule Take 100 mg by mouth daily as needed for mild constipation.     dofetilide (TIKOSYN) 250 MCG capsule TAKE (1) CAPSULE BY MOUTH TWICE DAILY. 60 capsule 6   fluticasone (FLONASE) 50 MCG/ACT nasal spray Place 1 spray into both nostrils daily as needed for allergies or rhinitis. 16 g 2   HYDROcodone-acetaminophen (NORCO) 10-325 MG tablet Take 1 tablet by mouth every 4 (four) hours.     hydroxypropyl methylcellulose / hypromellose (ISOPTO TEARS / GONIOVISC) 2.5 % ophthalmic solution Place 1 drop into both eyes 2 (two) times daily as needed for dry eyes.     ibuprofen (ADVIL) 800 MG tablet Take 800 mg by mouth 2 (two) times a week.     Magnesium Oxide (MAG-OXIDE) 200 MG TABS Take 1 tablet (200 mg total) by mouth daily. (Patient taking differently: Take 250 mg by mouth daily. Taking 250mg  by mouth daily) 90 tablet 1   metoprolol tartrate (LOPRESSOR) 50 MG tablet Take 1 tablet (50 mg total) by mouth 2 (two) times daily. 60 tablet 6   omeprazole (PRILOSEC) 40 MG capsule Take 40 mg by mouth daily.     potassium chloride (KLOR-CON) 10 MEQ tablet Take 1 tablet (10 mEq total) by mouth daily. (  Patient taking differently: Take 40 mEq by mouth daily.) 30 tablet 6   PROAIR HFA 108 (90 Base) MCG/ACT inhaler Inhale 2 puffs into the lungs every 6 (six) hours as needed for wheezing or shortness of breath.      STIOLTO RESPIMAT 2.5-2.5 MCG/ACT AERS INHALE 2 PUFFS INTO THE LUNGS DAILY. 4 g 0   torsemide (DEMADEX) 20 MG tablet TAKE 2 TABLETS BY MOUTH DAILY. 180 tablet 1   No current facility-administered medications for this encounter.    ROS- All systems are reviewed and negative except as per the HPI above  Physical Exam: Vitals:   03/22/23 0958  BP: 114/66  Pulse: (!) 47  Weight: 93.6 kg  Height: 5\' 1"  (1.549 m)     Wt  Readings from Last 3 Encounters:  03/22/23 93.6 kg  11/11/22 91.4 kg  09/20/22 96 kg    GEN: Well nourished, well developed in no acute distress NECK: No JVD; No carotid bruits CARDIAC: Regular rate and rhythm, no murmurs, rubs, gallops RESPIRATORY:  Clear to auscultation without rales, wheezing or rhonchi  ABDOMEN: Soft, non-tender, non-distended EXTREMITIES:  No edema; No deformity     EKG today demonstrates SB, 1st degree AV block Vent. rate 47 BPM PR interval 230 ms QRS duration 116 ms QT/QTcB 514/454 ms   CHA2DS2-VASc Score = 2  The patient's score is based upon: CHF History: 1 HTN History: 0 Diabetes History: 0 Stroke History: 0 Vascular Disease History: 0 Age Score: 0 Gender Score: 1       ASSESSMENT AND PLAN: Paroxysmal Atrial Fibrillation/atrial flutter The patient's CHA2DS2-VASc score is 2, indicating a 2.2% annual risk of stroke.   S/p dofetilide loading 12/27-12/30/22 Patient in SR today, low burden of afib Continue dofetilide 250 mcg BID, QT stable Check bmet/mag today Continue pradaxa 150 mg BID Decrease Lopressor to 25 mg BID, may take an extra PRN dose for heart racing.   HOCM S/p myomectomy  Chronic HFpEF Fluid status appears stable  OSA  Encouraged nightly CPAP   Follow up in the AF clinic in 6 months.    Jorja Loa PA-C Afib Clinic [facility] [address] [place], [place] [address] [phone]

## 2023-03-22 NOTE — Patient Instructions (Signed)
Decrease metoprolol to 25mg twice a day 

## 2023-04-05 DIAGNOSIS — G894 Chronic pain syndrome: Secondary | ICD-10-CM | POA: Diagnosis not present

## 2023-04-05 DIAGNOSIS — M1991 Primary osteoarthritis, unspecified site: Secondary | ICD-10-CM | POA: Diagnosis not present

## 2023-04-05 DIAGNOSIS — I10 Essential (primary) hypertension: Secondary | ICD-10-CM | POA: Diagnosis not present

## 2023-04-05 DIAGNOSIS — J449 Chronic obstructive pulmonary disease, unspecified: Secondary | ICD-10-CM | POA: Diagnosis not present

## 2023-06-02 DIAGNOSIS — M47816 Spondylosis without myelopathy or radiculopathy, lumbar region: Secondary | ICD-10-CM | POA: Diagnosis not present

## 2023-06-02 DIAGNOSIS — Z0001 Encounter for general adult medical examination with abnormal findings: Secondary | ICD-10-CM | POA: Diagnosis not present

## 2023-06-02 DIAGNOSIS — J449 Chronic obstructive pulmonary disease, unspecified: Secondary | ICD-10-CM | POA: Diagnosis not present

## 2023-06-02 DIAGNOSIS — J209 Acute bronchitis, unspecified: Secondary | ICD-10-CM | POA: Diagnosis not present

## 2023-06-02 DIAGNOSIS — I480 Paroxysmal atrial fibrillation: Secondary | ICD-10-CM | POA: Diagnosis not present

## 2023-06-02 DIAGNOSIS — G894 Chronic pain syndrome: Secondary | ICD-10-CM | POA: Diagnosis not present

## 2023-06-02 DIAGNOSIS — J0100 Acute maxillary sinusitis, unspecified: Secondary | ICD-10-CM | POA: Diagnosis not present

## 2023-06-02 DIAGNOSIS — I10 Essential (primary) hypertension: Secondary | ICD-10-CM | POA: Diagnosis not present

## 2023-06-02 DIAGNOSIS — M1991 Primary osteoarthritis, unspecified site: Secondary | ICD-10-CM | POA: Diagnosis not present

## 2023-06-19 ENCOUNTER — Other Ambulatory Visit: Payer: Self-pay

## 2023-06-19 ENCOUNTER — Emergency Department (HOSPITAL_COMMUNITY)
Admission: EM | Admit: 2023-06-19 | Discharge: 2023-06-19 | Disposition: A | Discharge status: Home / Self Care | Payer: 59 | Attending: Emergency Medicine | Admitting: Emergency Medicine

## 2023-06-19 ENCOUNTER — Encounter (HOSPITAL_COMMUNITY): Payer: Self-pay

## 2023-06-19 DIAGNOSIS — Z7982 Long term (current) use of aspirin: Secondary | ICD-10-CM | POA: Diagnosis not present

## 2023-06-19 DIAGNOSIS — W274XXA Contact with kitchen utensil, initial encounter: Secondary | ICD-10-CM | POA: Diagnosis not present

## 2023-06-19 DIAGNOSIS — T148XXA Other injury of unspecified body region, initial encounter: Secondary | ICD-10-CM

## 2023-06-19 DIAGNOSIS — S61011A Laceration without foreign body of right thumb without damage to nail, initial encounter: Secondary | ICD-10-CM | POA: Diagnosis not present

## 2023-06-19 NOTE — ED Triage Notes (Signed)
 Pt sliced rt thumb with a mandolin. Pt states she came because her PCP wanted her to get checked out because when she try's to take the pressure bandage off she starts bleeding again. Pt states she does take blood thinners.

## 2023-06-19 NOTE — ED Provider Notes (Signed)
 [place] EMERGENCY DEPARTMENT AT [facility] Provider Note   CSN: [id number] Arrival date & time: 06/19/23  1541     History  Chief Complaint  Patient presents with   Extremity Laceration    Kathleen Wright is a 58 y.o. female who sliced her thumb last night around 9 PM using a mandolin slicer.  She is on Pradaxa .  She cleaned it last night but took the bandage off this morning and it was bleeding.  Her granddaughter who is a nurse wrapped it with a pressure dressing.  She called her PCP who told her to come to the emergency department.  To date on her tetanus vaccination.  HPI     Home Medications Prior to Admission medications   Medication Sig Start Date End Date Taking? Authorizing Provider  albuterol  (PROVENTIL ) (2.5 MG/3ML) 0.083% nebulizer solution Take 3 mLs (2.5 mg total) by nebulization every 6 (six) hours as needed for wheezing or shortness of breath. 08/11/21 03/22/23  Cobb, Katherine V, NP  Ascorbic Acid (VITAMIN C PO) Take 1 tablet by mouth daily.    [provider]  aspirin  EC 81 MG tablet Take 81 mg by mouth daily.    [provider]  Cholecalciferol (VITAMIN D3 PO) Take 1 tablet by mouth daily.    [provider]  dabigatran  (PRADAXA ) 150 MG CAPS capsule Take 1 capsule (150 mg total) by mouth 2 (two) times daily. 06/27/18   Dow Arland BROCKS, NP  docusate sodium  (COLACE) 100 MG capsule Take 100 mg by mouth daily as needed for mild constipation.    [provider]  dofetilide  (TIKOSYN ) 250 MCG capsule TAKE (1) CAPSULE BY MOUTH TWICE DAILY. 03/02/23   Fenton, Clint R, PA  fluticasone  (FLONASE ) 50 MCG/ACT nasal spray Place 1 spray into both nostrils daily as needed for allergies or rhinitis. 11/11/22   Sood, Vineet, MD  HYDROcodone -acetaminophen  (NORCO) 10-325 MG tablet Take 1 tablet by mouth every 4 (four) hours. 04/12/21   [provider]  hydroxypropyl methylcellulose / hypromellose (ISOPTO TEARS / GONIOVISC) 2.5 %  ophthalmic solution Place 1 drop into both eyes 2 (two) times daily as needed for dry eyes.    [provider]  ibuprofen (ADVIL) 800 MG tablet Take 800 mg by mouth 2 (two) times a week. 01/02/23   [provider]  Magnesium  Oxide (MAG-OXIDE) 200 MG TABS Take 1 tablet (200 mg total) by mouth daily. Patient taking differently: Take 250 mg by mouth daily. Taking 250mg  by mouth daily 05/11/21   Leverne Charlies Helling, PA-C  metoprolol  tartrate (LOPRESSOR ) 25 MG tablet Take 1 tablet (25 mg total) by mouth 2 (two) times daily. 03/22/23   Fenton, Clint R, PA  omeprazole  (PRILOSEC) 40 MG capsule Take 40 mg by mouth daily. 02/11/21   [provider]  potassium chloride  (KLOR-CON ) 10 MEQ tablet Take 1 tablet (10 mEq total) by mouth daily. Patient taking differently: Take 40 mEq by mouth daily. 09/23/22   Fenton, Clint R, PA  PROAIR  HFA 108 (90 Base) MCG/ACT inhaler Inhale 2 puffs into the lungs every 6 (six) hours as needed for wheezing or shortness of breath.  05/27/15   [provider]  STIOLTO RESPIMAT  2.5-2.5 MCG/ACT AERS INHALE 2 PUFFS INTO THE LUNGS DAILY. 06/03/22   Cobb, Comer GAILS, NP  torsemide  (DEMADEX ) 20 MG tablet TAKE 2 TABLETS BY MOUTH DAILY. 06/03/22   Jeffrie Oneil BROCKS, MD      Allergies    Amiodarone  Review of Systems   Review of Systems  Physical Exam Updated Vital Signs BP 130/68 (BP Location: Left Arm) Comment (BP Location): Finger lac to right thumb  Pulse 63   Temp 98.7 F (37.1 C) (Oral)   Resp 14   Ht 5' 1 (1.549 m)   Wt 93.6 kg   SpO2 90%   BMI 38.99 kg/m  Physical Exam Vitals and nursing note reviewed.  Constitutional:      General: She is not in acute distress.    Appearance: She is well-developed. She is not diaphoretic.  HENT:     Head: Normocephalic and atraumatic.     Right Ear: External ear normal.     Left Ear: External ear normal.     Nose: Nose normal.     Mouth/Throat:     Mouth: Mucous membranes are moist.  Eyes:      General: No scleral icterus.    Conjunctiva/sclera: Conjunctivae normal.  Cardiovascular:     Rate and Rhythm: Normal rate and regular rhythm.     Heart sounds: Normal heart sounds. No murmur heard.    No friction rub. No gallop.  Pulmonary:     Effort: Pulmonary effort is normal. No respiratory distress.     Breath sounds: Normal breath sounds.  Abdominal:     General: Bowel sounds are normal. There is no distension.     Palpations: Abdomen is soft. There is no mass.     Tenderness: There is no abdominal tenderness. There is no guarding.  Musculoskeletal:     Cervical back: Normal range of motion.     Comments: Small tissue avulsion to the radial side of the palmar surface of the right thumb, no active bleeding.  Skin:    General: Skin is warm and dry.  Neurological:     Mental Status: She is alert and oriented to person, place, and time.  Psychiatric:        Behavior: Behavior normal.     ED Results / Procedures / Treatments   Labs (all labs ordered are listed, but only abnormal results are displayed) Labs Reviewed - No data to display  EKG None  Radiology No results found.  Procedures Procedures    Medications Ordered in ED Medications - No data to display  ED Course/ Medical Decision Making/ A&P                                 Medical Decision Making Patient here for evaluation of tissue injury.  She has an avulsion injury, it is not actively bleeding.  Discussed bleeding mgmt. Patient is UTD on tetanus. Safe for discharge at this time            Final Clinical Impression(s) / ED Diagnoses Final diagnoses:  Soft tissue avulsion    Rx / DC Orders ED Discharge Orders     None         Arloa Chroman, PA-C 06/19/23 1640    Francesca Elsie CROME, MD 06/19/23 [phone]

## 2023-06-19 NOTE — Discharge Instructions (Signed)
Contact a health care provider if:  You received a tetanus shot and you have swelling, severe pain, redness, or bleeding at the injection site.  Your pain is not controlled with medicine.  You have any of these signs of infection:  More redness, swelling, or pain around the wound.  Fluid or blood coming from the wound.  Warmth coming from the wound.  A fever or chills.  You are nauseous or you vomit.  You are dizzy.  You have a new rash or hardness around the wound.  Get help right away if:  You have a red streak of skin near the area around your wound.  Pus or a bad smell coming from the wound.  Your wound has been closed with staples, sutures, skin glue, or adhesive strips and it begins to open up and separate.  Your wound is bleeding, and the bleeding does not stop with gentle pressure.  These symptoms may represent a serious problem that is an emergency. Do not wait to see if the symptoms will go away. Get medical help right away. Call your local emergency services (911 in the [place] Do not drive yourself to the hospital.

## 2023-07-05 ENCOUNTER — Telehealth: Payer: Self-pay | Admitting: Cardiology

## 2023-07-05 NOTE — Telephone Encounter (Signed)
Spoke with the patient's daughter who states that her PCP failed to send in her PA for pradaxa on time and the patient just took her last dose yesterday. She states that she talked to her insurance company and they advised it was being expedited but it will still take 3-4 business days. She states that the pharmacy will not fill it until they get the PA. I advised that we do not have samples but that she could try using GoodRx and not file through insurance to see how much it would cost.

## 2023-07-05 NOTE — Telephone Encounter (Signed)
Pt c/o medication issue:  1. Name of Medication:   dabigatran (PRADAXA) 150 MG CAPS capsule    2. How are you currently taking this medication (dosage and times per day)? Take 1 capsule (150 mg total) by mouth 2 (two) times daily.   3. Are you having a reaction (difficulty breathing--STAT)? No  4. What is your medication issue? Pt requesting samples. Waiting on PA from pcp and is not getting response. Took last one last night

## 2023-08-01 DIAGNOSIS — Z9229 Personal history of other drug therapy: Secondary | ICD-10-CM | POA: Diagnosis not present

## 2023-08-01 DIAGNOSIS — G894 Chronic pain syndrome: Secondary | ICD-10-CM | POA: Diagnosis not present

## 2023-08-01 DIAGNOSIS — M1991 Primary osteoarthritis, unspecified site: Secondary | ICD-10-CM | POA: Diagnosis not present

## 2023-08-01 DIAGNOSIS — I10 Essential (primary) hypertension: Secondary | ICD-10-CM | POA: Diagnosis not present

## 2023-08-01 DIAGNOSIS — I480 Paroxysmal atrial fibrillation: Secondary | ICD-10-CM | POA: Diagnosis not present

## 2023-08-01 DIAGNOSIS — M47816 Spondylosis without myelopathy or radiculopathy, lumbar region: Secondary | ICD-10-CM | POA: Diagnosis not present

## 2023-08-01 DIAGNOSIS — Z0001 Encounter for general adult medical examination with abnormal findings: Secondary | ICD-10-CM | POA: Diagnosis not present

## 2023-08-01 DIAGNOSIS — J449 Chronic obstructive pulmonary disease, unspecified: Secondary | ICD-10-CM | POA: Diagnosis not present

## 2023-08-01 DIAGNOSIS — E782 Mixed hyperlipidemia: Secondary | ICD-10-CM | POA: Diagnosis not present

## 2023-09-20 ENCOUNTER — Ambulatory Visit (HOSPITAL_COMMUNITY)
Admission: RE | Admit: 2023-09-20 | Discharge: 2023-09-20 | Disposition: A | Discharge status: Home / Self Care | Payer: 59 | Source: Ambulatory Visit | Attending: Physician Assistant | Admitting: Physician Assistant

## 2023-09-20 ENCOUNTER — Encounter (HOSPITAL_COMMUNITY): Payer: Self-pay | Admitting: Physician Assistant

## 2023-09-20 VITALS — BP 116/72 | HR 50 | Ht 61.0 in | Wt 212.6 lb

## 2023-09-20 DIAGNOSIS — G4733 Obstructive sleep apnea (adult) (pediatric): Secondary | ICD-10-CM | POA: Insufficient documentation

## 2023-09-20 DIAGNOSIS — Z79899 Other long term (current) drug therapy: Secondary | ICD-10-CM | POA: Insufficient documentation

## 2023-09-20 DIAGNOSIS — I480 Paroxysmal atrial fibrillation: Secondary | ICD-10-CM | POA: Insufficient documentation

## 2023-09-20 DIAGNOSIS — J449 Chronic obstructive pulmonary disease, unspecified: Secondary | ICD-10-CM | POA: Diagnosis not present

## 2023-09-20 DIAGNOSIS — I421 Obstructive hypertrophic cardiomyopathy: Secondary | ICD-10-CM | POA: Insufficient documentation

## 2023-09-20 DIAGNOSIS — I4892 Unspecified atrial flutter: Secondary | ICD-10-CM | POA: Insufficient documentation

## 2023-09-20 DIAGNOSIS — Z7902 Long term (current) use of antithrombotics/antiplatelets: Secondary | ICD-10-CM | POA: Diagnosis not present

## 2023-09-20 DIAGNOSIS — I5032 Chronic diastolic (congestive) heart failure: Secondary | ICD-10-CM | POA: Diagnosis not present

## 2023-09-20 DIAGNOSIS — Z5181 Encounter for therapeutic drug level monitoring: Secondary | ICD-10-CM | POA: Diagnosis not present

## 2023-09-20 DIAGNOSIS — I4819 Other persistent atrial fibrillation: Secondary | ICD-10-CM | POA: Diagnosis present

## 2023-09-20 LAB — MAGNESIUM: Magnesium: 1.9 mg/dL (ref 1.7–2.4)

## 2023-09-20 LAB — BASIC METABOLIC PANEL WITH GFR
Anion gap: 11 (ref 5–15)
BUN: 19 mg/dL (ref 6–20)
CO2: 23 mmol/L (ref 22–32)
Calcium: 8.9 mg/dL (ref 8.9–10.3)
Chloride: 104 mmol/L (ref 98–111)
Creatinine, Ser: 0.82 mg/dL (ref 0.44–1.00)
GFR, Estimated: >60 mL/min (ref >60)
Glucose, Bld: 109 mg/dL — ABNORMAL HIGH (ref 70–99)
Potassium: 4.1 mmol/L (ref 3.5–5.1)
Sodium: 138 mmol/L (ref 135–145)

## 2023-09-20 NOTE — Progress Notes (Signed)
 Primary Care Physician: Elfredia Nevins, MD Referring Physician: Dr. Lalla Brothers  Primary Cardiologist: Dr Anne Fu Primary EP: Dr Ancil Linsey is a 58 y.o. female with a h/o a h/o persistent afib, COPD, HOCM, s/p afib ablation at [facility] in July of  2018. Patient is s/p dofetilide loading 12/27-12/30/22. She presented in SR and did not require DCCV. Her dose had to be down titrated due to a short episode of WCT.   Patient returns for follow up for atrial fibrillation and dofetilide monitoring. She reports that she has done well since her last visit. She has had 1-2 brief episodes of afib. No specific triggers that she could identify. No bleeding issues on anticoagulation.   Today, she  denies symptoms of palpitations, chest pain, orthopnea, PND, lower extremity edema, dizziness, presyncope, syncope, bleeding, or neurologic sequela. The patient is tolerating medications without difficulties and is otherwise without complaint today.    Past Medical History:  Diagnosis Date   Asthma    Cervical cancer (HCC)    a. treated with partial removal of cervix   COPD (chronic obstructive pulmonary disease) (HCC)    Hypertrophic cardiomyopathy (HCC)    a. s/p myomectomy at [facility] 08/2004   Paroxysmal atrial flutter (HCC)    a. post-op after myomectomy in 2006. s/p TEE/DCCV.   Sleep apnea    Transient atrial fibrillation (HCC) 2006   a. after myomectomy at [facility] in 08/2004 - was on amiodarone but developed elevated LFTs felt possibly secondary to amiodarone     Current Outpatient Medications  Medication Sig Dispense Refill   Ascorbic Acid (VITAMIN C PO) Take 1 tablet by mouth daily.     aspirin EC 81 MG tablet Take 81 mg by mouth daily.     Cholecalciferol (VITAMIN D3 PO) Take 1 tablet by mouth daily.     dabigatran (PRADAXA) 150 MG CAPS capsule Take 1 capsule (150 mg total) by mouth 2 (two) times daily. 60 capsule 6   docusate sodium (COLACE) 100 MG capsule Take 100 mg by mouth  daily as needed for mild constipation.     dofetilide (TIKOSYN) 250 MCG capsule TAKE (1) CAPSULE BY MOUTH TWICE DAILY. 60 capsule 6   fluticasone (FLONASE) 50 MCG/ACT nasal spray Place 1 spray into both nostrils daily as needed for allergies or rhinitis. 16 g 2   hydroxypropyl methylcellulose / hypromellose (ISOPTO TEARS / GONIOVISC) 2.5 % ophthalmic solution Place 1 drop into both eyes 2 (two) times daily as needed for dry eyes.     ibuprofen (ADVIL) 800 MG tablet Take 800 mg by mouth 2 (two) times a week.     levothyroxine (SYNTHROID) 25 MCG tablet Take 25 mcg by mouth daily.     Magnesium Oxide (MAG-OXIDE) 200 MG TABS Take 1 tablet (200 mg total) by mouth daily. (Patient taking differently: Take 250 mg by mouth daily. Taking 250mg  by mouth daily) 90 tablet 1   metoprolol tartrate (LOPRESSOR) 25 MG tablet Take 1 tablet (25 mg total) by mouth 2 (two) times daily. 60 tablet 6   omeprazole (PRILOSEC) 40 MG capsule Take 40 mg by mouth daily.     Oxycodone HCl 10 MG TABS Take 10 mg by mouth every 6 (six) hours.     potassium chloride (KLOR-CON) 10 MEQ tablet Take 1 tablet (10 mEq total) by mouth daily. (Patient taking differently: Take 40 mEq by mouth daily.) 30 tablet 6   PROAIR HFA 108 (90 Base) MCG/ACT inhaler Inhale 2 puffs  into the lungs every 6 (six) hours as needed for wheezing or shortness of breath.      RESTASIS 0.05 % ophthalmic emulsion Place 1 drop into both eyes 2 (two) times daily.     STIOLTO RESPIMAT 2.5-2.5 MCG/ACT AERS INHALE 2 PUFFS INTO THE LUNGS DAILY. 4 g 0   torsemide (DEMADEX) 20 MG tablet TAKE 2 TABLETS BY MOUTH DAILY. 180 tablet 1   albuterol (PROVENTIL) (2.5 MG/3ML) 0.083% nebulizer solution Take 3 mLs (2.5 mg total) by nebulization every 6 (six) hours as needed for wheezing or shortness of breath. 75 mL 5   No current facility-administered medications for this encounter.    ROS- All systems are reviewed and negative except as per the HPI above  Physical Exam: Vitals:    09/20/23 0955  BP: 116/72  Pulse: (!) 50  Weight: 96.4 kg  Height: 5\' 1"  (1.549 m)      Wt Readings from Last 3 Encounters:  09/20/23 96.4 kg  03/22/23 93.6 kg  11/11/22 91.4 kg    GEN: Well nourished, well developed in no acute distress NECK: No JVD; No carotid bruits CARDIAC: Regular rate and rhythm, no murmurs, rubs, gallops RESPIRATORY:  faint wheeze right side ABDOMEN: Soft, non-tender, non-distended EXTREMITIES:  No edema; No deformity     EKG today demonstrates SB, 1st degree AV block Vent. rate 50 BPM PR interval 230 ms QRS duration 118 ms QT/QTcB 492/448 ms   CHA2DS2-VASc Score = 2  The patient's score is based upon: CHF History: 1 HTN History: 0 Diabetes History: 0 Stroke History: 0 Vascular Disease History: 0 Age Score: 0 Gender Score: 1       ASSESSMENT AND PLAN: Paroxysmal Atrial Fibrillation/atrial flutter The patient's CHA2DS2-VASc score is 2, indicating a 2.2% annual risk of stroke.   S/p dofetilide loading 05/2021 Patient appears to be maintaining SR Continue dofetilide 250 mcg BID Continue Pradaxa 150 mg BID Continue Lopressor 25 mg BID   High Risk Medication Monitoring (ICD 10: Z79.899) QT interval on ECG acceptable for dofetilide monitoring. Check bmet/mag today.      HOCM  S/p myomectomy   Chronic HFpEF EF 65-70% Fluid status appears stable today  OSA  Encouraged nightly CPAP   Follow up in the AF clinic in 6 months.    Jorja Loa PA-C Afib Clinic [facility] [address] [place], [place] [address] [phone]

## 2023-09-27 ENCOUNTER — Other Ambulatory Visit: Payer: Self-pay | Admitting: Cardiology

## 2023-09-28 ENCOUNTER — Telehealth: Payer: Self-pay | Admitting: Cardiology

## 2023-09-28 DIAGNOSIS — G894 Chronic pain syndrome: Secondary | ICD-10-CM | POA: Diagnosis not present

## 2023-09-28 DIAGNOSIS — I10 Essential (primary) hypertension: Secondary | ICD-10-CM | POA: Diagnosis not present

## 2023-09-28 DIAGNOSIS — J449 Chronic obstructive pulmonary disease, unspecified: Secondary | ICD-10-CM | POA: Diagnosis not present

## 2023-09-28 DIAGNOSIS — M47816 Spondylosis without myelopathy or radiculopathy, lumbar region: Secondary | ICD-10-CM | POA: Diagnosis not present

## 2023-09-28 DIAGNOSIS — M1991 Primary osteoarthritis, unspecified site: Secondary | ICD-10-CM | POA: Diagnosis not present

## 2023-09-28 DIAGNOSIS — I480 Paroxysmal atrial fibrillation: Secondary | ICD-10-CM | POA: Diagnosis not present

## 2023-09-28 NOTE — Telephone Encounter (Signed)
 Spoke with the patient who states that she is taking torsemide and not lasix. She states that her PCP must have gotten things mixed up. She is aware that she needs to either schedule a follow up with our office for future refills of her torsemide or she will need her PCP to prescribe it.   Spoke with pharmacist, Heidi Llamas and she is aware.

## 2023-09-28 NOTE — Telephone Encounter (Signed)
 Pt c/o medication issue:  1. Name of Medication: torsemide (DEMADEX) 20 MG tablet   2. How are you currently taking this medication (dosage and times per day)?   3. Are you having a reaction (difficulty breathing--STAT)?   4. What is your medication issue? Dup therapy, we sent torsemide and pcp sent in furosemide today, pharmacy requesting cb to know which one to keep/discontinue. Will put on hold until hearing back from us 

## 2023-09-28 NOTE — Telephone Encounter (Signed)
 Spoke with pharmacist Heidi Llamas and she states patient's PCP sent in Rx for lasix and torsemide was sent in form our office.   They would like to know which one should the patient be taking? They will place both medications on hold until our office return call  Tried to reach PCP office and left voicemail to return call to office.

## 2023-10-12 ENCOUNTER — Other Ambulatory Visit: Payer: Self-pay

## 2023-10-12 ENCOUNTER — Inpatient Hospital Stay (HOSPITAL_COMMUNITY)
Admission: EM | Admit: 2023-10-12 | Discharge: 2023-10-18 | DRG: 190 | Disposition: A | Discharge status: Home / Self Care | Attending: Internal Medicine | Admitting: Internal Medicine

## 2023-10-12 ENCOUNTER — Emergency Department (HOSPITAL_COMMUNITY)

## 2023-10-12 ENCOUNTER — Encounter (HOSPITAL_COMMUNITY): Payer: Self-pay

## 2023-10-12 DIAGNOSIS — B957 Other staphylococcus as the cause of diseases classified elsewhere: Secondary | ICD-10-CM | POA: Diagnosis not present

## 2023-10-12 DIAGNOSIS — Z79890 Hormone replacement therapy (postmenopausal): Secondary | ICD-10-CM

## 2023-10-12 DIAGNOSIS — J449 Chronic obstructive pulmonary disease, unspecified: Secondary | ICD-10-CM

## 2023-10-12 DIAGNOSIS — Z8249 Family history of ischemic heart disease and other diseases of the circulatory system: Secondary | ICD-10-CM

## 2023-10-12 DIAGNOSIS — Z90712 Acquired absence of cervix with remaining uterus: Secondary | ICD-10-CM

## 2023-10-12 DIAGNOSIS — E039 Hypothyroidism, unspecified: Secondary | ICD-10-CM | POA: Diagnosis not present

## 2023-10-12 DIAGNOSIS — I421 Obstructive hypertrophic cardiomyopathy: Secondary | ICD-10-CM | POA: Diagnosis not present

## 2023-10-12 DIAGNOSIS — R7989 Other specified abnormal findings of blood chemistry: Secondary | ICD-10-CM | POA: Diagnosis not present

## 2023-10-12 DIAGNOSIS — Z87891 Personal history of nicotine dependence: Secondary | ICD-10-CM | POA: Diagnosis not present

## 2023-10-12 DIAGNOSIS — I5032 Chronic diastolic (congestive) heart failure: Secondary | ICD-10-CM | POA: Diagnosis present

## 2023-10-12 DIAGNOSIS — Z7982 Long term (current) use of aspirin: Secondary | ICD-10-CM

## 2023-10-12 DIAGNOSIS — R0602 Shortness of breath: Secondary | ICD-10-CM | POA: Diagnosis not present

## 2023-10-12 DIAGNOSIS — J9601 Acute respiratory failure with hypoxia: Secondary | ICD-10-CM | POA: Diagnosis present

## 2023-10-12 DIAGNOSIS — J441 Chronic obstructive pulmonary disease with (acute) exacerbation: Secondary | ICD-10-CM | POA: Diagnosis not present

## 2023-10-12 DIAGNOSIS — E66812 Obesity, class 2: Secondary | ICD-10-CM | POA: Diagnosis present

## 2023-10-12 DIAGNOSIS — Z79899 Other long term (current) drug therapy: Secondary | ICD-10-CM

## 2023-10-12 DIAGNOSIS — R7881 Bacteremia: Secondary | ICD-10-CM | POA: Diagnosis not present

## 2023-10-12 DIAGNOSIS — Z888 Allergy status to other drugs, medicaments and biological substances status: Secondary | ICD-10-CM

## 2023-10-12 DIAGNOSIS — Z1152 Encounter for screening for COVID-19: Secondary | ICD-10-CM | POA: Diagnosis not present

## 2023-10-12 DIAGNOSIS — R9431 Abnormal electrocardiogram [ECG] [EKG]: Secondary | ICD-10-CM

## 2023-10-12 DIAGNOSIS — G894 Chronic pain syndrome: Secondary | ICD-10-CM | POA: Diagnosis present

## 2023-10-12 DIAGNOSIS — R0689 Other abnormalities of breathing: Secondary | ICD-10-CM | POA: Diagnosis not present

## 2023-10-12 DIAGNOSIS — Z8679 Personal history of other diseases of the circulatory system: Secondary | ICD-10-CM | POA: Diagnosis not present

## 2023-10-12 DIAGNOSIS — Z833 Family history of diabetes mellitus: Secondary | ICD-10-CM | POA: Diagnosis not present

## 2023-10-12 DIAGNOSIS — I480 Paroxysmal atrial fibrillation: Secondary | ICD-10-CM | POA: Diagnosis present

## 2023-10-12 DIAGNOSIS — Z8541 Personal history of malignant neoplasm of cervix uteri: Secondary | ICD-10-CM

## 2023-10-12 DIAGNOSIS — Z7902 Long term (current) use of antithrombotics/antiplatelets: Secondary | ICD-10-CM | POA: Diagnosis not present

## 2023-10-12 DIAGNOSIS — Z6838 Body mass index (BMI) 38.0-38.9, adult: Secondary | ICD-10-CM

## 2023-10-12 DIAGNOSIS — J189 Pneumonia, unspecified organism: Secondary | ICD-10-CM | POA: Insufficient documentation

## 2023-10-12 DIAGNOSIS — Z5181 Encounter for therapeutic drug level monitoring: Secondary | ICD-10-CM | POA: Diagnosis not present

## 2023-10-12 LAB — BASIC METABOLIC PANEL WITH GFR
Anion gap: 7 (ref 5–15)
BUN: 20 mg/dL (ref 6–20)
CO2: 26 mmol/L (ref 22–32)
Calcium: 8.5 mg/dL — ABNORMAL LOW (ref 8.9–10.3)
Chloride: 104 mmol/L (ref 98–111)
Creatinine, Ser: 0.83 mg/dL (ref 0.44–1.00)
GFR, Estimated: >60 mL/min (ref >60)
Glucose, Bld: 106 mg/dL — ABNORMAL HIGH (ref 70–99)
Potassium: 3.7 mmol/L (ref 3.5–5.1)
Sodium: 137 mmol/L (ref 135–145)

## 2023-10-12 LAB — CBC
HCT: 36.6 % (ref 36.0–46.0)
Hemoglobin: 11.6 g/dL — ABNORMAL LOW (ref 12.0–15.0)
MCH: 28.5 pg (ref 26.0–34.0)
MCHC: 31.7 g/dL (ref 30.0–36.0)
MCV: 89.9 fL (ref 80.0–100.0)
Platelets: 358 10*3/uL (ref 150–400)
RBC: 4.07 MIL/uL (ref 3.87–5.11)
RDW: 15.1 % (ref 11.5–15.5)
WBC: 14 10*3/uL — ABNORMAL HIGH (ref 4.0–10.5)
nRBC: 0 % (ref 0.0–0.2)

## 2023-10-12 LAB — BRAIN NATRIURETIC PEPTIDE: B Natriuretic Peptide: 514 pg/mL — ABNORMAL HIGH (ref 0.0–100.0)

## 2023-10-12 LAB — RESP PANEL BY RT-PCR (RSV, FLU A&B, COVID)  RVPGX2
Influenza A by PCR: NEGATIVE
Influenza B by PCR: NEGATIVE
Resp Syncytial Virus by PCR: NEGATIVE
SARS Coronavirus 2 by RT PCR: NEGATIVE

## 2023-10-12 MED ORDER — MAGNESIUM SULFATE 2 GM/50ML IV SOLN
2.0000 g | Freq: Once | INTRAVENOUS | Status: AC
  Administered 2023-10-12: 2 g via INTRAVENOUS
  Filled 2023-10-12: qty 50

## 2023-10-12 MED ORDER — SODIUM CHLORIDE 0.9 % IV SOLN
1.0000 g | Freq: Once | INTRAVENOUS | Status: AC
  Administered 2023-10-12: 1 g via INTRAVENOUS
  Filled 2023-10-12: qty 10

## 2023-10-12 MED ORDER — METHYLPREDNISOLONE SODIUM SUCC 125 MG IJ SOLR
125.0000 mg | Freq: Once | INTRAMUSCULAR | Status: AC
  Administered 2023-10-12: 125 mg via INTRAVENOUS
  Filled 2023-10-12: qty 2

## 2023-10-12 MED ORDER — SODIUM CHLORIDE 0.9 % IV SOLN
500.0000 mg | INTRAVENOUS | Status: DC
  Administered 2023-10-12: 500 mg via INTRAVENOUS
  Filled 2023-10-12: qty 5

## 2023-10-12 MED ORDER — IPRATROPIUM-ALBUTEROL 0.5-2.5 (3) MG/3ML IN SOLN
3.0000 mL | RESPIRATORY_TRACT | Status: AC
  Administered 2023-10-12 (×3): 3 mL via RESPIRATORY_TRACT
  Filled 2023-10-12: qty 9

## 2023-10-12 NOTE — ED Provider Notes (Signed)
  Provider Note MRN:  [id number]  Arrival date & time: 10/13/23    ED Course and Medical Decision Making  Assumed care of patient at sign-out or upon transfer.  COPD exacerbation failing outpatient management, also with fever and so treating for CAP.  Plan is for admission.  New oxygen  requirement at this time, 1 to 2 L.  Procedures  Final Clinical Impressions(s) / ED Diagnoses     ICD-10-CM   1. COPD exacerbation [facility])  J44.1       ED Discharge Orders     None       Discharge Instructions   None     Merrick Abe. Harless Lien, MD [facility] Emergency Medicine [facility] [email or web address]    Edson Graces, MD 10/13/23 [phone]

## 2023-10-12 NOTE — H&P (Signed)
 History and Physical    Patient: Kathleen Wright ZOX:[id number] DOB: 1965/09/27 DOA: 10/12/2023 DOS: the patient was seen and examined on 10/13/2023 PCP: [name], MD  Patient coming from: Home  Chief Complaint:  Chief Complaint  Patient presents with   Shortness of Breath   HPI: Kathleen Wright is a 58 y.o. female with medical history significant of COPD, asthma, hypertrophic cardiomyopathy, paroxysmal atrial fibrillation who presents emergency department due to 1 week onset of productive cough with yellow/green tinge sputum.  She states that she went to her PCP who prescribed prednisone  dose pack without any improvement and she endorsed having fever as well, so she decided to go to the ED for further evaluation and management.  She denies any sick contacts and she states that she has not smoked any cigarettes in 2.5 months.  She endorsed O2 sat of 83 to 88% at home, she does not use supplemental oxygen  at baseline.  ED Course:  In the emergency department, she was hemodynamically stable.  Workup in the ED showed normal CBC except for WBC of 14.0 and hemoglobin of 11.6, BMP was normal except for blood glucose of 106, BNP 514.  Influenza A, B, SARS, Nancyi 2, RSV was negative. Chest x-ray shows stable chest, no acute process. Patient was empirically treated with IV ceftriaxone  and azithromycin  due to presumed CAP.  Breathing treatment was provided with DuoNebs.  IV Solu-Medrol  and 5 mg x 1 was given, magnesium  was provided.  TRH was asked to admit.  Review of Systems: Review of systems as noted in the HPI. All other systems reviewed and are negative.   Past Medical History:  Diagnosis Date   Asthma    Cervical cancer (HCC)    a. treated with partial removal of cervix   COPD (chronic obstructive pulmonary disease) (HCC)    Hypertrophic cardiomyopathy (HCC)    a. s/p myomectomy at [facility] 08/2004   Paroxysmal atrial flutter (HCC)    a. post-op after myomectomy in 2006. s/p TEE/DCCV.    Sleep apnea    Transient atrial fibrillation (HCC) 2006   a. after myomectomy at [facility] in 08/2004 - was on amiodarone but developed elevated LFTs felt possibly secondary to amiodarone    Past Surgical History:  Procedure Laterality Date   CARDIOVERSION N/A 08/12/2015   Procedure: CARDIOVERSION;  Surgeon: Gerard Knight, MD;  Location: [facility] ENDO SUITE;  Service: Cardiovascular;  Laterality: N/A;   CARDIOVERSION N/A 08/24/2020   Procedure: CARDIOVERSION;  Surgeon: Hugh Madura, MD;  Location: [facility] ENDOSCOPY;  Service: Cardiovascular;  Laterality: N/A;   CARDIOVERSION N/A 04/13/2021   Procedure: CARDIOVERSION;  Surgeon: Gerard Knight, MD;  Location: [facility] ORS;  Service: Cardiovascular;  Laterality: N/A;   Septal myomectomy  06/13/2004   [facility]   TEE WITHOUT CARDIOVERSION N/A 03/29/2016   Procedure: TRANSESOPHAGEAL ECHOCARDIOGRAM (TEE);  Surgeon: Lenise Quince, MD;  Location: [facility] ENDOSCOPY;  Service: Cardiovascular;  Laterality: N/A;   TEE WITHOUT CARDIOVERSION N/A 06/23/2016   Procedure: TRANSESOPHAGEAL ECHOCARDIOGRAM (TEE);  Surgeon: Lake Pilgrim, MD;  Location: [facility] ENDOSCOPY;  Service: Cardiovascular;  Laterality: N/A;   TRANSESOPHAGEAL ECHOCARDIOGRAM  2005   TUBAL LIGATION      Social History:  reports that she quit smoking about 2 years ago. Her smoking use included cigarettes. She has never used smokeless tobacco. She reports that she does not drink alcohol and does not use drugs.   Allergies  Allergen Reactions   Amiodarone Other (See Comments)    Liver  function    Family History  Problem Relation Age of Onset   Heart failure Father    Diabetes Other    Lung disease Other      Prior to Admission medications   Medication Sig Start Date End Date Taking? Authorizing Provider  albuterol  (PROVENTIL ) (2.5 MG/3ML) 0.083% nebulizer solution Take 3 mLs (2.5 mg total) by nebulization every 6 (six) hours as needed for wheezing or shortness of breath. 08/11/21 10/12/23 Yes Cobb,  Mariah Shines, NP  aspirin  EC 81 MG tablet Take 81 mg by mouth daily.   Yes [provider]  Cholecalciferol (VITAMIN D3 PO) Take 1 tablet by mouth daily.   Yes [provider]  dabigatran  (PRADAXA ) 150 MG CAPS capsule Take 1 capsule (150 mg total) by mouth 2 (two) times daily. 06/27/18  Yes Asa Lauth, NP  docusate sodium  (COLACE) 100 MG capsule Take 100 mg by mouth daily as needed for mild constipation.   Yes [provider]  dofetilide  (TIKOSYN ) 250 MCG capsule TAKE (1) CAPSULE BY MOUTH TWICE DAILY. 03/02/23  Yes Fenton, Clint R, PA  hydroxypropyl methylcellulose / hypromellose (ISOPTO TEARS / GONIOVISC) 2.5 % ophthalmic solution Place 1 drop into both eyes 2 (two) times daily as needed for dry eyes.   Yes [provider]  ibuprofen (ADVIL) 800 MG tablet Take 800 mg by mouth every 8 (eight) hours as needed for mild pain (pain score 1-3). 01/02/23  Yes [provider]  levothyroxine  (SYNTHROID ) 25 MCG tablet Take 25 mcg by mouth daily. 08/03/23  Yes [provider]  methylPREDNISolone  (MEDROL  DOSEPAK) 4 MG TBPK tablet Take 4 mg by mouth as directed. Dose pack 09/28/23  Yes [provider]  metoprolol  tartrate (LOPRESSOR ) 25 MG tablet Take 1 tablet (25 mg total) by mouth 2 (two) times daily. 03/22/23  Yes Fenton, Clint R, PA  omeprazole (PRILOSEC) 40 MG capsule Take 40 mg by mouth daily. 02/11/21  Yes [provider]  Oxycodone  HCl 10 MG TABS Take 10 mg by mouth every 6 (six) hours. 08/29/23  Yes [provider]  potassium chloride  (KLOR-CON ) 10 MEQ tablet Take 1 tablet (10 mEq total) by mouth daily. Patient taking differently: Take 20 mEq by mouth 2 (two) times daily. 09/23/22  Yes Fenton, Clint R, PA  PROAIR  HFA 108 (90 Base) MCG/ACT inhaler Inhale 2 puffs into the lungs every 6 (six) hours as needed for wheezing or shortness of breath.  05/27/15  Yes [provider]  STIOLTO RESPIMAT  2.5-2.5 MCG/ACT AERS INHALE 2  PUFFS INTO THE LUNGS DAILY. 06/03/22  Yes Cobb, Mariah Shines, NP  torsemide  (DEMADEX ) 20 MG tablet TAKE 2 TABLETS BY MOUTH DAILY. Patient taking differently: Take 40 mg by mouth daily as needed (fluid, swelling). 09/28/23  Yes Boyce Byes, MD    Physical Exam: BP (!) 114/58   Pulse (!) 55   Temp 99.7 F (37.6 C) (Oral)   Resp 18   SpO2 93%   General: 58 y.o. year-old female well developed well nourished in no acute distress.  Alert and oriented x3. HEENT: NCAT, EOMI Neck: Supple, trachea medial Cardiovascular: Regular rate and rhythm with no rubs or gallops.  No thyromegaly or JVD noted.  No lower extremity edema. 2/4 pulses in all 4 extremities. Respiratory: Diffuse expiratory wheezes.  No rales  Abdomen: Soft, nontender nondistended with normal bowel sounds x4 quadrants. Muskuloskeletal: No cyanosis, clubbing or edema noted bilaterally Neuro: CN II-XII intact, strength 5/5 x 4, sensation, reflexes intact Skin:  No ulcerative lesions noted or rashes Psychiatry: Judgement and insight appear normal. Mood is appropriate for condition and setting          Labs on Admission:  Basic Metabolic Panel: Recent Labs  Lab 10/12/23 2131 10/13/23 0342  NA 137 136  K 3.7 4.3  CL 104 105  CO2 26 24  GLUCOSE 106* 199*  BUN 20 16  CREATININE 0.83 0.72  CALCIUM 8.5* 8.3*  MG  --  2.3  PHOS  --  3.3   Liver Function Tests: Recent Labs  Lab 10/13/23 0342  AST 17  ALT 14  ALKPHOS 112  BILITOT 0.4  PROT 6.9  ALBUMIN 3.2*   No results for input(s): "LIPASE", "AMYLASE" in the last 168 hours. No results for input(s): "AMMONIA" in the last 168 hours. CBC: Recent Labs  Lab 10/12/23 2131 10/13/23 0342  WBC 14.0* 13.6*  HGB 11.6* 11.0*  HCT 36.6 35.6*  MCV 89.9 89.2  PLT 358 311   Cardiac Enzymes: No results for input(s): "CKTOTAL", "CKMB", "CKMBINDEX", "TROPONINI" in the last 168 hours.  BNP (last 3 results) Recent Labs    10/12/23 2131  BNP 514.0*    ProBNP (last  3 results) No results for input(s): "PROBNP" in the last 8760 hours.  CBG: No results for input(s): "GLUCAP" in the last 168 hours.  Radiological Exams on Admission: DG Chest 2 View Result Date: 10/12/2023 CLINICAL DATA:  Short of breath, difficulty breathing EXAM: CHEST - 2 VIEW COMPARISON:  04/01/2021 FINDINGS: Frontal and lateral views of the chest demonstrate postsurgical changes from median sternotomy. The cardiac silhouette remains enlarged. No acute airspace disease, effusion, or pneumothorax. No acute bony abnormalities. IMPRESSION: 1. Stable chest, no acute process. Electronically Signed   By: Bobbye Burrow M.D.   On: 10/12/2023 21:23    EKG: I independently viewed the EKG done and my findings are as followed: Sinus arrhythmia at a rate of 65 bpm  Assessment/Plan Present on Admission:  Acute exacerbation of chronic obstructive pulmonary disease (COPD) (HCC)  Principal Problem:   Acute exacerbation of chronic obstructive pulmonary disease (COPD) (HCC) Active Problems:   History of atrial fibrillation   CAP (community acquired pneumonia)   Elevated brain natriuretic peptide (BNP) level   Acquired hypothyroidism  Acute exacerbation of COPD Acute respiratory failure with hypoxia Continue duo nebs, Mucinex , Solu-Medrol , doxycycline . Continue Protonix  to prevent steroid-induced ulcer Continue incentive spirometry and flutter valve Continue supplemental oxygen  to maintain O2 sat > 94% with plan to wean patient off oxygen  as tolerated  Presumed CAP POA Patient endorsed fever at home and presented with productive cough with outpatient failure for COPD treatment.  She was presumed to have CAP, though no chest x-ray findings of pneumonia Patient was started on ceftriaxone  and azithromycin , we shall continue ceftriaxone  and doxycycline  at this time with plan to de-escalate/discontinue based on blood culture, sputum culture, urine Legionella, strep pneumo and procalcitonin Continue  Tylenol  as needed Continue Mucinex , incentive spirometry, flutter valve   Elevated BNP in the setting of chronic diastolic CHF BNP 161 (this is chronically elevated, it was 610 about 2 years ago) Continue total input/output, daily weights and fluid restriction Continue heart healthy diet  Echocardiogram done on 02/28/2021 showed LVEF of 65 to 70%.  No RWMA.  Moderate asymmetric LVH of the septal segment.  G3 DD.    History of A-fib Continue Pradaxa , Tikosyn , Lopressor   Acquired hypothyroidism Continue Synthroid   DVT prophylaxis: Pradaxa   Code Status: Full code  Family Communication: None  at bedside  Consults: None  Severity of Illness: The appropriate patient status for this patient is OBSERVATION. Observation status is judged to be reasonable and necessary in order to provide the required intensity of service to ensure the patient's safety. The patient's presenting symptoms, physical exam findings, and initial radiographic and laboratory data in the context of their medical condition is felt to place them at decreased risk for further clinical deterioration. Furthermore, it is anticipated that the patient will be medically stable for discharge from the hospital within 2 midnights of admission.   Author: Jozie Wulf, DO 10/13/2023 4:33 AM  For on call review [email or web address].

## 2023-10-12 NOTE — ED Notes (Signed)
 ED Provider at bedside.

## 2023-10-12 NOTE — ED Triage Notes (Signed)
 Pt presents with shortness of breath x couple of days. Has had cough and fever. Hx of COPD and asthma. Pt states oxygen  was reading around 83-88% at home. No O2 at home.  Oxygen  at 92% on RA with audible wheezing.   100.1 Temp

## 2023-10-12 NOTE — ED Provider Notes (Signed)
 [place] EMERGENCY DEPARTMENT AT [facility] Provider Note   CSN: [id number] Arrival date & time: 10/12/23  2057     History  Chief Complaint  Patient presents with   Shortness of Breath    Kathleen Wright is a 58 y.o. female with PMH as listed below who presents with wheezing and shortness of breath x couple of days.  Really started last Sunday and received a prednisone  burst from her PCP which did not improve her symptoms.  Has had cough productive of green and brown mucus, worse than normal, and fever. Hx of COPD and asthma.  Home nebulizers are helping a little but not much.  Denies any sick contacts.  Endorses mild pressure on the chest.  Denies any history of DVT or PE, denies lower extremity edema or orthopnea.  Pt states oxygen  was reading around 83-88% at home. No O2 at home.   Oxygen  at 92% on RA with audible wheezing.  Now 97% on 3 L nasal cannula.   100.1 Temp.    Past Medical History:  Diagnosis Date   Asthma    Cervical cancer (HCC)    a. treated with partial removal of cervix   COPD (chronic obstructive pulmonary disease) (HCC)    Hypertrophic cardiomyopathy (HCC)    a. s/p myomectomy at [facility] 08/2004   Paroxysmal atrial flutter (HCC)    a. post-op after myomectomy in 2006. s/p TEE/DCCV.   Sleep apnea    Transient atrial fibrillation (HCC) 2006   a. after myomectomy at [facility] in 08/2004 - was on amiodarone but developed elevated LFTs felt possibly secondary to amiodarone        Home Medications Prior to Admission medications   Medication Sig Start Date End Date Taking? Authorizing Provider  albuterol  (PROVENTIL ) (2.5 MG/3ML) 0.083% nebulizer solution Take 3 mLs (2.5 mg total) by nebulization every 6 (six) hours as needed for wheezing or shortness of breath. 08/11/21 10/12/23 Yes Cobb, Mariah Shines, NP  aspirin  EC 81 MG tablet Take 81 mg by mouth daily.   Yes [provider]  Cholecalciferol (VITAMIN D3 PO) Take 1 tablet by mouth daily.   Yes  [provider]  dabigatran  (PRADAXA ) 150 MG CAPS capsule Take 1 capsule (150 mg total) by mouth 2 (two) times daily. 06/27/18  Yes Asa Lauth, NP  docusate sodium  (COLACE) 100 MG capsule Take 100 mg by mouth daily as needed for mild constipation.   Yes [provider]  dofetilide  (TIKOSYN ) 250 MCG capsule TAKE (1) CAPSULE BY MOUTH TWICE DAILY. 03/02/23  Yes Fenton, Clint R, PA  hydroxypropyl methylcellulose / hypromellose (ISOPTO TEARS / GONIOVISC) 2.5 % ophthalmic solution Place 1 drop into both eyes 2 (two) times daily as needed for dry eyes.   Yes [provider]  ibuprofen (ADVIL) 800 MG tablet Take 800 mg by mouth every 8 (eight) hours as needed for mild pain (pain score 1-3). 01/02/23  Yes [provider]  levothyroxine  (SYNTHROID ) 25 MCG tablet Take 25 mcg by mouth daily. 08/03/23  Yes [provider]  methylPREDNISolone  (MEDROL  DOSEPAK) 4 MG TBPK tablet Take 4 mg by mouth as directed. Dose pack 09/28/23  Yes [provider]  metoprolol  tartrate (LOPRESSOR ) 25 MG tablet Take 1 tablet (25 mg total) by mouth 2 (two) times daily. 03/22/23  Yes Fenton, Clint R, PA  omeprazole (PRILOSEC) 40 MG capsule Take 40 mg by mouth daily. 02/11/21  Yes [provider]  Oxycodone  HCl 10 MG TABS Take 10  mg by mouth every 6 (six) hours. 08/29/23  Yes [provider]  potassium chloride  (KLOR-CON ) 10 MEQ tablet Take 1 tablet (10 mEq total) by mouth daily. Patient taking differently: Take 20 mEq by mouth 2 (two) times daily. 09/23/22  Yes Fenton, Clint R, PA  PROAIR  HFA 108 (90 Base) MCG/ACT inhaler Inhale 2 puffs into the lungs every 6 (six) hours as needed for wheezing or shortness of breath.  05/27/15  Yes [provider]  STIOLTO RESPIMAT  2.5-2.5 MCG/ACT AERS INHALE 2 PUFFS INTO THE LUNGS DAILY. 06/03/22  Yes Cobb, Mariah Shines, NP  torsemide  (DEMADEX ) 20 MG tablet TAKE 2 TABLETS BY MOUTH DAILY. Patient taking differently: Take 40 mg  by mouth daily as needed (fluid, swelling). 09/28/23  Yes Boyce Byes, MD      Allergies    Amiodarone    Review of Systems   Review of Systems A 10 point review of systems was performed and is negative unless otherwise reported in HPI.  Physical Exam Updated Vital Signs BP 119/62   Pulse 68   Temp 100.1 F (37.8 C) (Oral)   Resp (!) 23   SpO2 98%  Physical Exam General: Normal appearing female, lying in bed.  HEENT: PERRLA, Sclera anicteric, MMM, trachea midline.  Cardiology: RRR, no murmurs/rubs/gallops. BL radial and DP pulses equal bilaterally.  Resp: Mildly increased work of breathing and mild tachypnea with diffuse expiratory wheezing bilaterally..  Abd: Soft, non-tender, non-distended. No rebound tenderness or guarding.  GU: Deferred. MSK: No peripheral edema or signs of trauma. Skin: warm, dry.  Neuro: A&Ox4, CNs II-XII grossly intact. MAEs. Sensation grossly intact.  Psych: Normal mood and affect.   ED Results / Procedures / Treatments   Labs (all labs ordered are listed, but only abnormal results are displayed) Labs Reviewed  BASIC METABOLIC PANEL WITH GFR - Abnormal; Notable for the following components:      Result Value   Glucose, Bld 106 (*)    Calcium 8.5 (*)    All other components within normal limits  CBC - Abnormal; Notable for the following components:   WBC 14.0 (*)    Hemoglobin 11.6 (*)    All other components within normal limits  BRAIN NATRIURETIC PEPTIDE - Abnormal; Notable for the following components:   B Natriuretic Peptide 514.0 (*)    All other components within normal limits  RESP PANEL BY RT-PCR (RSV, FLU A&B, COVID)  [id number]    EKG EKG Interpretation Date/Time:  Thursday Oct 12 2023 21:39:56 EDT Ventricular Rate:  65 PR Interval:  220 QRS Duration:  130 QT Interval:  424 QTC Calculation: 441 R Axis:   -48  Text Interpretation: Sinus arrhythmia Prolonged PR interval Nonspecific IVCD with LAD Anteroseptal infarct, old  Nonspecific T abnormalities, lateral leads Similar to prior Confirmed by Annita Kindle [phone]) on 10/12/2023 10:02:35 PM  Radiology DG Chest 2 View Result Date: 10/12/2023 CLINICAL DATA:  Short of breath, difficulty breathing EXAM: CHEST - 2 VIEW COMPARISON:  04/01/2021 FINDINGS: Frontal and lateral views of the chest demonstrate postsurgical changes from median sternotomy. The cardiac silhouette remains enlarged. No acute airspace disease, effusion, or pneumothorax. No acute bony abnormalities. IMPRESSION: 1. Stable chest, no acute process. Electronically Signed   By: Bobbye Burrow M.D.   On: 10/12/2023 21:23    Procedures Procedures    Medications Ordered in ED Medications  cefTRIAXone  (ROCEPHIN ) 1 g in sodium chloride  0.9 % 100 mL IVPB (has no administration in time range)  azithromycin  (ZITHROMAX )  500 mg in sodium chloride  0.9 % 250 mL IVPB (has no administration in time range)  magnesium  sulfate IVPB 2 g 50 mL (0 g Intravenous Stopped 10/12/23 2231)  ipratropium-albuterol  (DUONEB) 0.5-2.5 (3) MG/3ML nebulizer solution 3 mL (3 mLs Nebulization Given 10/12/23 2155)  methylPREDNISolone  sodium succinate (SOLU-MEDROL ) 125 mg/2 mL injection 125 mg (125 mg Intravenous Given 10/12/23 2131)    ED Course/ Medical Decision Making/ A&P                          Medical Decision Making Amount and/or Complexity of Data Reviewed Labs: ordered. Decision-making details documented in ED Course. Radiology: ordered. Decision-making details documented in ED Course.  Risk Prescription drug management. Decision regarding hospitalization.    This patient presents to the ED for concern of wheezing/SOB, this involves an extensive number of treatment options, and is a complaint that carries with it a high risk of complications and morbidity.  I considered the following differential and admission for this acute, potentially life threatening condition.   MDM:    DDX for dyspnea includes but is not limited  to:  Likely COPD exacerbation.  Patient was already treated with prednisone  and nebulizers at home and has had outpatient treatment failure.  Even after magnesium , 3 DuoNebs, Solu-Medrol  IV here she is still intermittently hypoxic to 87%.  Put on 1 L nasal cannula.  No consolidation on x-ray.  Pulmonary edema or significant lower extremity edema to indicate heart failure exacerbation.   Clinical Course as of 10/12/23 2300  Thu Oct 12, 2023  2202 DG Chest 2 View 1. Stable chest, no acute process. [HN]  2224 WBC(!): 14.0 Could be due to prednisone  burst at home [HN]  2250 Resp panel by RT-PCR (RSV, Flu A&B, Covid) Anterior Nasal Swab neg [HN]  2250 With fever, increased cough/sputum production, and SOB w/ COPD exacerbation will give IV abx for CAP. No consolidation noted on CXR. [HN]  2258 Patient's O2 87%-92% at rest now on RA. Still with mildly increased WOB and significant wheezing bilaterally. Will need admission for COPD exacerbation. Discussed with patient/family. Placed on 1L [place]. [HN]    Clinical Course User Index [HN] Merdis Stalling, MD    Labs: I Ordered, and personally interpreted labs.  The pertinent results include: Those listed above  Imaging Studies ordered: I ordered imaging studies including chest x-ray I independently visualized and interpreted imaging. I agree with the radiologist interpretation  Additional history obtained from chart review, daughter/granddaughter at bedside.    Cardiac Monitoring: The patient was maintained on a cardiac monitor.  I personally viewed and interpreted the cardiac monitored which showed an underlying rhythm of: Normal sinus rhythm  Reevaluation: After the interventions noted above, I reevaluated the patient and found that they have :stayed the same  Social Determinants of Health:  lives independently  Disposition:  Admit to hospitalist  Co morbidities that complicate the patient evaluation  Past Medical History:  Diagnosis  Date   Asthma    Cervical cancer (HCC)    a. treated with partial removal of cervix   COPD (chronic obstructive pulmonary disease) (HCC)    Hypertrophic cardiomyopathy (HCC)    a. s/p myomectomy at [facility] 08/2004   Paroxysmal atrial flutter (HCC)    a. post-op after myomectomy in 2006. s/p TEE/DCCV.   Sleep apnea    Transient atrial fibrillation (HCC) 2006   a. after myomectomy at [facility] in 08/2004 - was on amiodarone but developed elevated  LFTs felt possibly secondary to amiodarone      Medicines Meds ordered this encounter  Medications   magnesium  sulfate IVPB 2 g 50 mL   ipratropium-albuterol  (DUONEB) 0.5-2.5 (3) MG/3ML nebulizer solution 3 mL   methylPREDNISolone  sodium succinate (SOLU-MEDROL ) 125 mg/2 mL injection 125 mg   cefTRIAXone  (ROCEPHIN ) 1 g in sodium chloride  0.9 % 100 mL IVPB    Antibiotic Indication::   CAP   azithromycin  (ZITHROMAX ) 500 mg in sodium chloride  0.9 % 250 mL IVPB    Antibiotic Indication::   CAP    I have reviewed the patients home medicines and have made adjustments as needed  Problem List / ED Course: Problem List Items Addressed This Visit       Respiratory   COPD exacerbation (HCC) - Primary   Relevant Medications   methylPREDNISolone  (MEDROL  DOSEPAK) 4 MG TBPK tablet   azithromycin  (ZITHROMAX ) 500 mg in sodium chloride  0.9 % 250 mL IVPB                This note was created using dictation software, which may contain spelling or grammatical errors.    Merdis Stalling, MD 10/12/23 2300

## 2023-10-13 DIAGNOSIS — E039 Hypothyroidism, unspecified: Secondary | ICD-10-CM | POA: Diagnosis present

## 2023-10-13 DIAGNOSIS — J189 Pneumonia, unspecified organism: Secondary | ICD-10-CM

## 2023-10-13 DIAGNOSIS — R7989 Other specified abnormal findings of blood chemistry: Secondary | ICD-10-CM | POA: Diagnosis present

## 2023-10-13 DIAGNOSIS — J441 Chronic obstructive pulmonary disease with (acute) exacerbation: Secondary | ICD-10-CM | POA: Diagnosis not present

## 2023-10-13 LAB — HIV ANTIBODY (ROUTINE TESTING W REFLEX): HIV Screen 4th Generation wRfx: NONREACTIVE

## 2023-10-13 LAB — CBC
HCT: 35.6 % — ABNORMAL LOW (ref 36.0–46.0)
Hemoglobin: 11 g/dL — ABNORMAL LOW (ref 12.0–15.0)
MCH: 27.6 pg (ref 26.0–34.0)
MCHC: 30.9 g/dL (ref 30.0–36.0)
MCV: 89.2 fL (ref 80.0–100.0)
Platelets: 311 10*3/uL (ref 150–400)
RBC: 3.99 MIL/uL (ref 3.87–5.11)
RDW: 14.7 % (ref 11.5–15.5)
WBC: 13.6 10*3/uL — ABNORMAL HIGH (ref 4.0–10.5)
nRBC: 0 % (ref 0.0–0.2)

## 2023-10-13 LAB — COMPREHENSIVE METABOLIC PANEL WITH GFR
ALT: 14 U/L (ref 0–44)
AST: 17 U/L (ref 15–41)
Albumin: 3.2 g/dL — ABNORMAL LOW (ref 3.5–5.0)
Alkaline Phosphatase: 112 U/L (ref 38–126)
Anion gap: 7 (ref 5–15)
BUN: 16 mg/dL (ref 6–20)
CO2: 24 mmol/L (ref 22–32)
Calcium: 8.3 mg/dL — ABNORMAL LOW (ref 8.9–10.3)
Chloride: 105 mmol/L (ref 98–111)
Creatinine, Ser: 0.72 mg/dL (ref 0.44–1.00)
GFR, Estimated: >60 mL/min (ref >60)
Glucose, Bld: 199 mg/dL — ABNORMAL HIGH (ref 70–99)
Potassium: 4.3 mmol/L (ref 3.5–5.1)
Sodium: 136 mmol/L (ref 135–145)
Total Bilirubin: 0.4 mg/dL (ref 0.0–1.2)
Total Protein: 6.9 g/dL (ref 6.5–8.1)

## 2023-10-13 LAB — MAGNESIUM: Magnesium: 2.3 mg/dL (ref 1.7–2.4)

## 2023-10-13 LAB — EXPECTORATED SPUTUM ASSESSMENT W GRAM STAIN, RFLX TO RESP C

## 2023-10-13 LAB — PHOSPHORUS: Phosphorus: 3.3 mg/dL (ref 2.5–4.6)

## 2023-10-13 LAB — STREP PNEUMONIAE URINARY ANTIGEN: Strep Pneumo Urinary Antigen: NEGATIVE

## 2023-10-13 LAB — PROCALCITONIN: Procalcitonin: <0.1 ng/mL

## 2023-10-13 MED ORDER — DOXYCYCLINE HYCLATE 100 MG PO TABS
100.0000 mg | ORAL_TABLET | Freq: Two times a day (BID) | ORAL | Status: DC
  Administered 2023-10-13 – 2023-10-18 (×10): 100 mg via ORAL
  Filled 2023-10-13 (×10): qty 1

## 2023-10-13 MED ORDER — IPRATROPIUM-ALBUTEROL 0.5-2.5 (3) MG/3ML IN SOLN: 3.0000 mL | RESPIRATORY_TRACT | Status: DC | PRN

## 2023-10-13 MED ORDER — DOXYCYCLINE HYCLATE 100 MG IV SOLR
100.0000 mg | Freq: Two times a day (BID) | INTRAVENOUS | Status: DC
  Administered 2023-10-13: 100 mg via INTRAVENOUS
  Filled 2023-10-13 (×3): qty 100

## 2023-10-13 MED ORDER — OXYCODONE HCL 5 MG PO TABS
10.0000 mg | ORAL_TABLET | Freq: Three times a day (TID) | ORAL | Status: DC | PRN
  Administered 2023-10-13 – 2023-10-18 (×10): 10 mg via ORAL
  Filled 2023-10-13 (×11): qty 2

## 2023-10-13 MED ORDER — ONDANSETRON HCL 4 MG PO TABS: 4.0000 mg | ORAL_TABLET | Freq: Four times a day (QID) | ORAL | Status: DC | PRN

## 2023-10-13 MED ORDER — ENOXAPARIN SODIUM 40 MG/0.4ML IJ SOSY: 40.0000 mg | PREFILLED_SYRINGE | INTRAMUSCULAR | Status: DC

## 2023-10-13 MED ORDER — ACETAMINOPHEN 650 MG RE SUPP: 650.0000 mg | Freq: Four times a day (QID) | RECTAL | Status: DC | PRN

## 2023-10-13 MED ORDER — LEVOTHYROXINE SODIUM 50 MCG PO TABS: 25.0000 ug | ORAL_TABLET | Freq: Every day | ORAL | Status: DC

## 2023-10-13 MED ORDER — DABIGATRAN ETEXILATE MESYLATE 150 MG PO CAPS
150.0000 mg | ORAL_CAPSULE | Freq: Two times a day (BID) | ORAL | Status: DC
  Administered 2023-10-13 – 2023-10-18 (×11): 150 mg via ORAL
  Filled 2023-10-13 (×13): qty 1

## 2023-10-13 MED ORDER — HYPROMELLOSE (GONIOSCOPIC) 2.5 % OP SOLN: 1.0000 [drp] | Freq: Two times a day (BID) | OPHTHALMIC | Status: DC | PRN

## 2023-10-13 MED ORDER — IPRATROPIUM-ALBUTEROL 0.5-2.5 (3) MG/3ML IN SOLN
3.0000 mL | Freq: Four times a day (QID) | RESPIRATORY_TRACT | Status: DC
  Administered 2023-10-13 – 2023-10-18 (×23): 3 mL via RESPIRATORY_TRACT
  Filled 2023-10-13 (×23): qty 3

## 2023-10-13 MED ORDER — LEVOTHYROXINE SODIUM 25 MCG PO TABS
25.0000 ug | ORAL_TABLET | Freq: Every day | ORAL | Status: DC
  Administered 2023-10-13 – 2023-10-18 (×6): 25 ug via ORAL
  Filled 2023-10-13 (×6): qty 1

## 2023-10-13 MED ORDER — POLYVINYL ALCOHOL 1.4 % OP SOLN: 1.0000 [drp] | Freq: Two times a day (BID) | OPHTHALMIC | Status: DC | PRN

## 2023-10-13 MED ORDER — GUAIFENESIN-DM 100-10 MG/5ML PO SYRP
5.0000 mL | ORAL_SOLUTION | ORAL | Status: DC | PRN
  Administered 2023-10-13: 5 mL via ORAL
  Filled 2023-10-13: qty 5

## 2023-10-13 MED ORDER — ASPIRIN 81 MG PO TBEC
81.0000 mg | DELAYED_RELEASE_TABLET | Freq: Every day | ORAL | Status: DC
  Administered 2023-10-13 – 2023-10-18 (×6): 81 mg via ORAL
  Filled 2023-10-13 (×6): qty 1

## 2023-10-13 MED ORDER — UMECLIDINIUM BROMIDE 62.5 MCG/ACT IN AEPB
1.0000 | INHALATION_SPRAY | Freq: Every day | RESPIRATORY_TRACT | Status: DC
  Administered 2023-10-13 – 2023-10-18 (×6): 1 via RESPIRATORY_TRACT
  Filled 2023-10-13: qty 7

## 2023-10-13 MED ORDER — METHYLPREDNISOLONE SODIUM SUCC 40 MG IJ SOLR
40.0000 mg | Freq: Two times a day (BID) | INTRAMUSCULAR | Status: DC
  Administered 2023-10-13 – 2023-10-18 (×11): 40 mg via INTRAVENOUS
  Filled 2023-10-13 (×11): qty 1

## 2023-10-13 MED ORDER — DM-GUAIFENESIN ER 30-600 MG PO TB12
1.0000 | ORAL_TABLET | Freq: Two times a day (BID) | ORAL | Status: DC
Start: 2023-10-13 — End: 2023-10-18
  Administered 2023-10-13 – 2023-10-18 (×11): 1 via ORAL
  Filled 2023-10-13 (×11): qty 1

## 2023-10-13 MED ORDER — ARFORMOTEROL TARTRATE 15 MCG/2ML IN NEBU
15.0000 ug | INHALATION_SOLUTION | Freq: Two times a day (BID) | RESPIRATORY_TRACT | Status: DC
  Administered 2023-10-13 – 2023-10-18 (×10): 15 ug via RESPIRATORY_TRACT
  Filled 2023-10-13 (×10): qty 2

## 2023-10-13 MED ORDER — DOFETILIDE 125 MCG PO CAPS
250.0000 ug | ORAL_CAPSULE | Freq: Two times a day (BID) | ORAL | Status: DC
  Administered 2023-10-13: 250 ug via ORAL
  Filled 2023-10-13 (×3): qty 1

## 2023-10-13 MED ORDER — PANTOPRAZOLE SODIUM 40 MG PO TBEC
40.0000 mg | DELAYED_RELEASE_TABLET | Freq: Every day | ORAL | Status: DC
  Administered 2023-10-13 – 2023-10-18 (×6): 40 mg via ORAL
  Filled 2023-10-13 (×6): qty 1

## 2023-10-13 MED ORDER — METOPROLOL TARTRATE 25 MG PO TABS
25.0000 mg | ORAL_TABLET | Freq: Two times a day (BID) | ORAL | Status: DC
  Administered 2023-10-13 – 2023-10-18 (×11): 25 mg via ORAL
  Filled 2023-10-13 (×11): qty 1

## 2023-10-13 MED ORDER — ONDANSETRON HCL 4 MG/2ML IJ SOLN: 4.0000 mg | Freq: Four times a day (QID) | INTRAMUSCULAR | Status: DC | PRN

## 2023-10-13 MED ORDER — ACETAMINOPHEN 325 MG PO TABS: 650.0000 mg | ORAL_TABLET | Freq: Four times a day (QID) | ORAL | Status: DC | PRN

## 2023-10-13 NOTE — Care Management Obs Status (Signed)
 MEDICARE OBSERVATION STATUS NOTIFICATION   Patient Details  Name: TENIOLA ESPOSITO MRN: [id number] Date of Birth: Nov 09, 1965   Medicare Observation Status Notification Given:  Yes    Geraldina Klinefelter, RN 10/13/2023, 8:36 PM

## 2023-10-13 NOTE — ED Notes (Addendum)
 Rate of Doxycycline  changed from 125 to 100 due to patient stating it was burning her arm.

## 2023-10-13 NOTE — Progress Notes (Signed)
 PROGRESS NOTE   Kathleen Wright, is a 58 y.o. female, DOB - [birth year], ZOX:[id number]  Admit date - 10/12/2023   Admitting Physician Twilla Galea, DO  Outpatient Primary MD for the patient is Kathleen Parkins, MD  LOS - 0  Chief Complaint  Patient presents with   Shortness of Breath        Brief Narrative:  58 y.o. female with medical history significant of COPD, asthma, hypertrophic cardiomyopathy, paroxysmal atrial fibrillation admitted on 10/12/2023 with acute COPD send patient    -Assessment and Plan: 1) acute COPD exacerbation--no definite pneumonia on chest x-ray and clinically -continue IV Solu-Medrol , doxycycline , bronchodilators, mucolytic's  2)HFpEF----echo with grade 3 diastolic dysfunction, EF is 55 to 50%, no regional wall motion abnormalities,, mild pulmonary artery hypertension noted - History of hypertrophic cardiomyopathy --- Pete echo showed there is moderate LVH including septal hypertrophy related to RVH. Patient reported to be status post septal myomectomy  - Continue metoprolol , hold off on diuretics  3)PAFib--- continue Pradaxa  for stroke prophylaxis and metoprolol  for rate control - Hold Tikosyn  due to electrolyte and QT concerns  4)Hypothyroidism--- continue levothyroxine   5) acute hypoxic respiratory failure--due to #1 above - Anticipate resolution with treatment of #1 above  6) chronic pain syndrome--- continue PTA opioids   Status is: Inpatient   Disposition: The patient is from: Home              Anticipated d/c is to: Home              Anticipated d/c date is: 2 days              Patient currently is not medically stable to d/c. Barriers: Not Clinically Stable-   Code Status :  -  Code Status: Full Code   Family Communication:    NA (patient is alert, awake and coherent)   DVT Prophylaxis  :   - SCDs  SCDs Start: 10/13/23 0316 dabigatran  (PRADAXA ) capsule 150 mg   Lab Results  Component Value Date   PLT 311 10/13/2023    Inpatient  Medications  Scheduled Meds:  arformoterol   15 mcg Nebulization BID   And   umeclidinium bromide   1 puff Inhalation Daily   aspirin  EC  81 mg Oral Daily   dabigatran   150 mg Oral BID   dextromethorphan -guaiFENesin   1 tablet Oral BID   doxycycline   100 mg Oral Q12H   ipratropium-albuterol   3 mL Nebulization Q6H   levothyroxine   25 mcg Oral Q0600   methylPREDNISolone  (SOLU-MEDROL ) injection  40 mg Intravenous Q12H   metoprolol  tartrate  25 mg Oral BID   pantoprazole   40 mg Oral Daily   Continuous Infusions: PRN Meds:.acetaminophen  **OR** acetaminophen , guaiFENesin -dextromethorphan , ipratropium-albuterol , ondansetron  **OR** ondansetron  (ZOFRAN ) IV, oxyCODONE , polyvinyl alcohol   Anti-infectives (From admission, onward)    Start     Dose/Rate Route Frequency Ordered Stop   10/13/23 2200  doxycycline  (VIBRA -TABS) tablet 100 mg        100 mg Oral Every 12 hours 10/13/23 1900     10/13/23 1000  doxycycline  (VIBRAMYCIN ) 100 mg in sodium chloride  0.9 % 250 mL IVPB  Status:  Discontinued        100 mg 125 mL/hr over 120 Minutes Intravenous Every 12 hours 10/13/23 0426 10/13/23 1900   10/12/23 2300  cefTRIAXone  (ROCEPHIN ) 1 g in sodium chloride  0.9 % 100 mL IVPB        1 g 200 mL/hr over 30 Minutes Intravenous  Once 10/12/23 2250 10/12/23 2350  10/12/23 2300  azithromycin  (ZITHROMAX ) 500 mg in sodium chloride  0.9 % 250 mL IVPB  Status:  Discontinued        500 mg 250 mL/hr over 60 Minutes Intravenous Every 24 hours 10/12/23 2250 10/13/23 0426         Subjective: Lavon Pound today has no fevers, no emesis,  No chest pain,   - Cough, dyspnea and hypoxia and wheezing persist -   Objective: Vitals:   10/13/23 1113 10/13/23 1129 10/13/23 1140 10/13/23 1610  BP:  129/80  119/77  Pulse:  (!) 53  (!) 56  Resp:  16  20  Temp: 97.6 F (36.4 C)   97.6 F (36.4 C)  TempSrc: Axillary   Oral  SpO2:  96% 98% 91%  Weight:  90.7 kg    Height:  5\' 1"  (1.549 m)      Intake/Output  Summary (Last 24 hours) at 10/13/2023 1900 Last data filed at 10/13/2023 1800 Gross per 24 hour  Intake 250 ml  Output 1100 ml  Net -850 ml   Filed Weights   10/13/23 1129  Weight: 90.7 kg    Physical Exam  Gen:- Awake Alert, no acute distress HEENT:- [place].AT, No sclera icterus Nose- [place] 2L/min Neck-Supple Neck,No JVD,.  Lungs-diminished breath sounds, scattered wheezes bilaterally  CV- S1, S2 normal, irregular  Abd-  +ve B.Sounds, Abd Soft, No tenderness,    Extremity/Skin:- No  edema, pedal pulses present  Psych-affect is appropriate, oriented x3 Neuro-no new focal deficits, no tremors  Data Reviewed: I have personally reviewed following labs and imaging studies  CBC: Recent Labs  Lab 10/12/23 2131 10/13/23 0342  WBC 14.0* 13.6*  HGB 11.6* 11.0*  HCT 36.6 35.6*  MCV 89.9 89.2  PLT 358 311   Basic Metabolic Panel: Recent Labs  Lab 10/12/23 2131 10/13/23 0342  NA 137 136  K 3.7 4.3  CL 104 105  CO2 26 24  GLUCOSE 106* 199*  BUN 20 16  CREATININE 0.83 0.72  CALCIUM 8.5* 8.3*  MG  --  2.3  PHOS  --  3.3   GFR: Estimated Creatinine Clearance: 77.7 mL/min (by C-G formula based on SCr of 0.72 mg/dL). Liver Function Tests: Recent Labs  Lab 10/13/23 0342  AST 17  ALT 14  ALKPHOS 112  BILITOT 0.4  PROT 6.9  ALBUMIN 3.2*   Recent Results (from the past 240 hours)  Resp panel by RT-PCR (RSV, Flu A&B, Covid) Anterior Nasal Swab     Status: None   Collection Time: 10/12/23  9:39 PM   Specimen: Anterior Nasal Swab  Result Value Ref Range Status   SARS Coronavirus 2 by RT PCR NEGATIVE NEGATIVE Final    Comment: (NOTE) SARS-CoV-2 target nucleic acids are NOT DETECTED.  The SARS-CoV-2 RNA is generally detectable in upper respiratory specimens during the acute phase of infection. The lowest concentration of SARS-CoV-2 viral copies this assay can detect is 138 copies/mL. A negative result does not preclude SARS-Cov-2 infection and should not be used as the sole  basis for treatment or other patient management decisions. A negative result may occur with  improper specimen collection/handling, submission of specimen other than nasopharyngeal swab, presence of viral mutation(s) within the areas targeted by this assay, and inadequate number of viral copies(<138 copies/mL). A negative result must be combined with clinical observations, patient history, and epidemiological information. The expected result is Negative.  Fact Sheet for Patients:  BloggerCourse.com  Fact Sheet for Healthcare Providers:  SeriousBroker.it  This  test is no t yet approved or cleared by the [place]  FDA and  has been authorized for detection and/or diagnosis of SARS-CoV-2 by FDA under an Emergency Use Authorization (EUA). This EUA will remain  in effect (meaning this test can be used) for the duration of the COVID-19 declaration under Section 564(b)(1) of the Act, 21 U.S.C.section 360bbb-3(b)(1), unless the authorization is terminated  or revoked sooner.       Influenza A by PCR NEGATIVE NEGATIVE Final   Influenza B by PCR NEGATIVE NEGATIVE Final    Comment: (NOTE) The Xpert Xpress SARS-CoV-2/FLU/RSV plus assay is intended as an aid in the diagnosis of influenza from Nasopharyngeal swab specimens and should not be used as a sole basis for treatment. Nasal washings and aspirates are unacceptable for Xpert Xpress SARS-CoV-2/FLU/RSV testing.  Fact Sheet for Patients: BloggerCourse.com  Fact Sheet for Healthcare Providers: SeriousBroker.it  This test is not yet approved or cleared by the [place]  FDA and has been authorized for detection and/or diagnosis of SARS-CoV-2 by FDA under an Emergency Use Authorization (EUA). This EUA will remain in effect (meaning this test can be used) for the duration of the COVID-19 declaration under Section 564(b)(1) of the Act,  21 U.S.C. section 360bbb-3(b)(1), unless the authorization is terminated or revoked.     Resp Syncytial Virus by PCR NEGATIVE NEGATIVE Final    Comment: (NOTE) Fact Sheet for Patients: BloggerCourse.com  Fact Sheet for Healthcare Providers: SeriousBroker.it  This test is not yet approved or cleared by the [place]  FDA and has been authorized for detection and/or diagnosis of SARS-CoV-2 by FDA under an Emergency Use Authorization (EUA). This EUA will remain in effect (meaning this test can be used) for the duration of the COVID-19 declaration under Section 564(b)(1) of the Act, 21 U.S.C. section 360bbb-3(b)(1), unless the authorization is terminated or revoked.  Performed at [facility], [address], [place], [place] [address]   Expectorated Sputum Assessment w Gram Stain, Rflx to Resp Cult     Status: None   Collection Time: 10/13/23  4:13 AM   Specimen: Expectorated Sputum  Result Value Ref Range Status   Specimen Description EXPECTORATED SPUTUM  Final   Special Requests NONE  Final   Sputum evaluation   Final    THIS SPECIMEN IS ACCEPTABLE FOR SPUTUM CULTURE Performed at [facility], [address], [place], [place] [address]    Report Status 10/13/2023 FINAL  Final  Culture, Respiratory w Gram Stain     Status: None (Preliminary result)   Collection Time: 10/13/23  4:13 AM  Result Value Ref Range Status   Specimen Description   Final    EXPECTORATED SPUTUM Performed at [facility], [address], [place], [place] [address]    Special Requests   Final    NONE Reflexed from [id number] Performed at [facility], [address], [place], [place] [address]    Gram Stain   Final    ABUNDANT WBC PRESENT, PREDOMINANTLY PMN NO ORGANISMS SEEN Performed at [facility], [address] [place], [place] [address]    Culture PENDING  Incomplete   Report Status PENDING  Incomplete  Culture, blood (Routine X 2) w  Reflex to ID Panel     Status: None (Preliminary result)   Collection Time: 10/13/23  4:15 AM   Specimen: BLOOD  Result Value Ref Range Status   Specimen Description BLOOD RIGHT ANTECUBITAL  Final   Special Requests   Final    BOTTLES  DRAWN AEROBIC AND ANAEROBIC Blood Culture results may not be optimal due to an inadequate volume of blood received in culture bottles   Culture   Final    NO GROWTH < 12 HOURS Performed at [facility], [address] [place], [place] [address]    Report Status PENDING  Incomplete  Culture, blood (Routine X 2) w Reflex to ID Panel     Status: None (Preliminary result)   Collection Time: 10/13/23  4:30 AM   Specimen: BLOOD  Result Value Ref Range Status   Specimen Description BLOOD BLOOD RIGHT HAND  Final   Special Requests   Final    BOTTLES DRAWN AEROBIC AND ANAEROBIC Blood Culture results may not be optimal due to an inadequate volume of blood received in culture bottles   Culture   Final    NO GROWTH < 12 HOURS Performed at [facility], [address], [place], [place] [address]    Report Status PENDING  Incomplete      Radiology Studies: DG Chest 2 View Result Date: 10/12/2023 CLINICAL DATA:  Short of breath, difficulty breathing EXAM: CHEST - 2 VIEW COMPARISON:  04/01/2021 FINDINGS: Frontal and lateral views of the chest demonstrate postsurgical changes from median sternotomy. The cardiac silhouette remains enlarged. No acute airspace disease, effusion, or pneumothorax. No acute bony abnormalities. IMPRESSION: 1. Stable chest, no acute process. Electronically Signed   By: Bobbye Burrow M.D.   On: 10/12/2023 21:23     Scheduled Meds:  arformoterol   15 mcg Nebulization BID   And   umeclidinium bromide   1 puff Inhalation Daily   aspirin  EC  81 mg Oral Daily   dabigatran   150 mg Oral BID   dextromethorphan -guaiFENesin   1 tablet Oral BID   doxycycline   100 mg Oral Q12H   ipratropium-albuterol   3 mL Nebulization Q6H   levothyroxine   25 mcg Oral  Q0600   methylPREDNISolone  (SOLU-MEDROL ) injection  40 mg Intravenous Q12H   metoprolol  tartrate  25 mg Oral BID   pantoprazole   40 mg Oral Daily   Continuous Infusions:   LOS: 0 days    Colin Dawley M.D on 10/13/2023 at 7:00 PM  Go to [email or web address] - for contact info  [facility] - Office  [phone]  If 7PM-7AM, please contact night-coverage [email or web address] 10/13/2023, 7:00 PM

## 2023-10-13 NOTE — TOC CM/SW Note (Signed)
 Transition of Care [facility]) - Inpatient Brief Assessment   Patient Details  Name: Kathleen Wright MRN: [id number] Date of Birth: Dec 09, 1965  Transition of Care [facility]) CM/SW Contact:    Grandville Lax, LCSWA Phone Number: 10/13/2023, 7:55 AM   Clinical Narrative: Transition of Care Department [facility]) has reviewed patient and no TOC needs have been identified at this time. We will continue to monitor patient advancement through interdiciplinary progression rounds. If new patient transition needs arise, please place a TOC consult.   Transition of Care Asessment: Insurance and Status: Insurance coverage has been reviewed Patient has primary care physician: Yes Home environment has been reviewed: From home Prior level of function:: Independent Prior/Current Home Services: No current home services Social Drivers of Health Review: SDOH reviewed no interventions necessary Readmission risk has been reviewed: Yes Transition of care needs: no transition of care needs at this time

## 2023-10-13 NOTE — Progress Notes (Signed)
 2307 Pt daughter POA [employer] called questioning why pt is not on Tikosyn  and requested pt be put on telemetry monitor if "playing with her Tikosyn ". Messaged hospitalist reqarding [facility] [employer] request.

## 2023-10-13 NOTE — ED Notes (Signed)
 Patient ambulated to the restroom. Patient stated that she did not want a bedside commode placed in her room.

## 2023-10-14 DIAGNOSIS — G894 Chronic pain syndrome: Secondary | ICD-10-CM | POA: Diagnosis present

## 2023-10-14 DIAGNOSIS — I480 Paroxysmal atrial fibrillation: Secondary | ICD-10-CM | POA: Diagnosis not present

## 2023-10-14 DIAGNOSIS — Z7902 Long term (current) use of antithrombotics/antiplatelets: Secondary | ICD-10-CM | POA: Diagnosis not present

## 2023-10-14 DIAGNOSIS — Z888 Allergy status to other drugs, medicaments and biological substances status: Secondary | ICD-10-CM | POA: Diagnosis not present

## 2023-10-14 DIAGNOSIS — Z8249 Family history of ischemic heart disease and other diseases of the circulatory system: Secondary | ICD-10-CM | POA: Diagnosis not present

## 2023-10-14 DIAGNOSIS — J189 Pneumonia, unspecified organism: Secondary | ICD-10-CM | POA: Diagnosis not present

## 2023-10-14 DIAGNOSIS — Z6838 Body mass index (BMI) 38.0-38.9, adult: Secondary | ICD-10-CM | POA: Diagnosis not present

## 2023-10-14 DIAGNOSIS — Z1152 Encounter for screening for COVID-19: Secondary | ICD-10-CM | POA: Diagnosis not present

## 2023-10-14 DIAGNOSIS — Z8541 Personal history of malignant neoplasm of cervix uteri: Secondary | ICD-10-CM | POA: Diagnosis not present

## 2023-10-14 DIAGNOSIS — Z87891 Personal history of nicotine dependence: Secondary | ICD-10-CM | POA: Diagnosis not present

## 2023-10-14 DIAGNOSIS — Z90712 Acquired absence of cervix with remaining uterus: Secondary | ICD-10-CM | POA: Diagnosis not present

## 2023-10-14 DIAGNOSIS — I421 Obstructive hypertrophic cardiomyopathy: Secondary | ICD-10-CM | POA: Diagnosis present

## 2023-10-14 DIAGNOSIS — R9431 Abnormal electrocardiogram [ECG] [EKG]: Secondary | ICD-10-CM | POA: Diagnosis not present

## 2023-10-14 DIAGNOSIS — E039 Hypothyroidism, unspecified: Secondary | ICD-10-CM | POA: Diagnosis not present

## 2023-10-14 DIAGNOSIS — I5032 Chronic diastolic (congestive) heart failure: Secondary | ICD-10-CM | POA: Diagnosis not present

## 2023-10-14 DIAGNOSIS — Z79890 Hormone replacement therapy (postmenopausal): Secondary | ICD-10-CM | POA: Diagnosis not present

## 2023-10-14 DIAGNOSIS — J441 Chronic obstructive pulmonary disease with (acute) exacerbation: Secondary | ICD-10-CM | POA: Diagnosis not present

## 2023-10-14 DIAGNOSIS — B957 Other staphylococcus as the cause of diseases classified elsewhere: Secondary | ICD-10-CM | POA: Diagnosis present

## 2023-10-14 DIAGNOSIS — R7989 Other specified abnormal findings of blood chemistry: Secondary | ICD-10-CM | POA: Diagnosis not present

## 2023-10-14 DIAGNOSIS — Z7982 Long term (current) use of aspirin: Secondary | ICD-10-CM | POA: Diagnosis not present

## 2023-10-14 DIAGNOSIS — Z833 Family history of diabetes mellitus: Secondary | ICD-10-CM | POA: Diagnosis not present

## 2023-10-14 DIAGNOSIS — J9601 Acute respiratory failure with hypoxia: Secondary | ICD-10-CM | POA: Diagnosis not present

## 2023-10-14 DIAGNOSIS — R7881 Bacteremia: Secondary | ICD-10-CM | POA: Diagnosis not present

## 2023-10-14 DIAGNOSIS — Z5181 Encounter for therapeutic drug level monitoring: Secondary | ICD-10-CM | POA: Diagnosis not present

## 2023-10-14 DIAGNOSIS — E66812 Obesity, class 2: Secondary | ICD-10-CM | POA: Diagnosis present

## 2023-10-14 DIAGNOSIS — Z79899 Other long term (current) drug therapy: Secondary | ICD-10-CM | POA: Diagnosis not present

## 2023-10-14 DIAGNOSIS — Z8679 Personal history of other diseases of the circulatory system: Secondary | ICD-10-CM | POA: Diagnosis not present

## 2023-10-14 MED ORDER — SODIUM CHLORIDE 0.9 % IV SOLN
2.0000 g | INTRAVENOUS | Status: AC
  Administered 2023-10-14 – 2023-10-15 (×2): 2 g via INTRAVENOUS
  Filled 2023-10-14 (×2): qty 20

## 2023-10-14 NOTE — Progress Notes (Signed)
 Blood cultures from 10/13/2023 with gram-positive cocci in aerobic bottle only --??  Contaminant -Treat empirically with Rocephin  for now pending further culture data -Repeat blood cultures on 10/14/2023  Colin Dawley, MD

## 2023-10-14 NOTE — Progress Notes (Addendum)
 PROGRESS NOTE   Kathleen Wright, is a 58 y.o. female, DOB - May 21, 1966, ZOX:[id number]  Admit date - 10/12/2023   Admitting Physician Trenice Mesa Quintella Buck, MD  Outpatient Primary MD for the patient is [name], MD  LOS - 0  Chief Complaint  Patient presents with   Shortness of Breath        Brief Narrative:  58 y.o. female with medical history significant of COPD, asthma, hypertrophic cardiomyopathy, paroxysmal atrial fibrillation admitted on 10/12/2023 with acute COPD exacerbation    -Assessment and Plan: 1) acute COPD exacerbation--no definite pneumonia on chest x-ray and clinically -continue IV Solu-Medrol ,  bronchodilators, mucolytic's - Give doxycycline , avoid azithromycin  due to QT prolongation concerns  2)HFpEF----echo with grade 3 diastolic dysfunction, EF is 55 to 50%, no regional wall motion abnormalities,, mild pulmonary artery hypertension noted - History of hypertrophic cardiomyopathy --- Pete echo showed there is moderate LVH including septal hypertrophy related to RVH. Patient reported to be status post septal myomectomy  - Continue metoprolol , hold off on diuretics  3)PAFib--- continue Pradaxa  for stroke prophylaxis and metoprolol  for rate control - Hold Tikosyn  due prolonged QT concerns with risk for torsades de pointes -Potassium was 4.3, Mag was 2.3 and phosphorus was 3.3 - Repeat EKG in a.m. consider restarting Tikosyn  if QTc improves  4)Hypothyroidism--- continue levothyroxine   5) acute hypoxic respiratory failure--due to #1 above - Anticipate resolution with treatment of #1 above  6) chronic pain syndrome--- continue PTA opioids  Status is: Inpatient   Disposition: The patient is from: Home              Anticipated d/c is to: Home              Anticipated d/c date is: 1 day              Patient currently is not medically stable to d/c. Barriers: Not Clinically Stable-   Code Status :  -  Code Status: Full Code   Family Communication:    NA  (patient is alert, awake and coherent)   DVT Prophylaxis  :   - SCDs  SCDs Start: 10/13/23 0316 dabigatran  (PRADAXA ) capsule 150 mg   Lab Results  Component Value Date   PLT 311 10/13/2023   Inpatient Medications  Scheduled Meds:  arformoterol   15 mcg Nebulization BID   And   umeclidinium bromide   1 puff Inhalation Daily   aspirin  EC  81 mg Oral Daily   dabigatran   150 mg Oral BID   dextromethorphan -guaiFENesin   1 tablet Oral BID   doxycycline   100 mg Oral Q12H   ipratropium-albuterol   3 mL Nebulization Q6H   levothyroxine   25 mcg Oral Q0600   methylPREDNISolone  (SOLU-MEDROL ) injection  40 mg Intravenous Q12H   metoprolol  tartrate  25 mg Oral BID   pantoprazole   40 mg Oral Daily   Continuous Infusions: PRN Meds:.acetaminophen  **OR** acetaminophen , guaiFENesin -dextromethorphan , ipratropium-albuterol , ondansetron  **OR** ondansetron  (ZOFRAN ) IV, oxyCODONE , polyvinyl alcohol   Anti-infectives (From admission, onward)    Start     Dose/Rate Route Frequency Ordered Stop   10/13/23 2200  doxycycline  (VIBRA -TABS) tablet 100 mg        100 mg Oral Every 12 hours 10/13/23 1900     10/13/23 1000  doxycycline  (VIBRAMYCIN ) 100 mg in sodium chloride  0.9 % 250 mL IVPB  Status:  Discontinued        100 mg 125 mL/hr over 120 Minutes Intravenous Every 12 hours 10/13/23 0426 10/13/23 1900   10/12/23 2300  cefTRIAXone  (  ROCEPHIN ) 1 g in sodium chloride  0.9 % 100 mL IVPB        1 g 200 mL/hr over 30 Minutes Intravenous  Once 10/12/23 2250 10/12/23 2350   10/12/23 2300  azithromycin  (ZITHROMAX ) 500 mg in sodium chloride  0.9 % 250 mL IVPB  Status:  Discontinued        500 mg 250 mL/hr over 60 Minutes Intravenous Every 24 hours 10/12/23 2250 10/13/23 0426        Subjective: Kathleen Wright today has no fevers, no emesis,  No chest pain,   - - Unable to wean off oxygen  at this time---still requiring about 2 L of oxygen  which is new for patient - Cough dyspnea and wheezing  persist - Objective: Vitals:   10/14/23 0159 10/14/23 0505 10/14/23 1428 10/14/23 1440  BP:  (!) 154/71 125/64   Pulse:  (!) 55 (!) 56   Resp:      Temp:  97.7 F (36.5 C) 97.8 F (36.6 C)   TempSrc:  Oral Oral   SpO2: 96% 100% 96% 100%  Weight:  93.7 kg    Height:        Intake/Output Summary (Last 24 hours) at 10/14/2023 1614 Last data filed at 10/14/2023 1300 Gross per 24 hour  Intake 480 ml  Output 500 ml  Net -20 ml   Filed Weights   10/13/23 1129 10/14/23 0505  Weight: 90.7 kg 93.7 kg    Physical Exam  Gen:- Awake Alert, no acute distress HEENT:- [place].AT, No sclera icterus Nose- [place] 2L/min (New) Neck-Supple Neck,No JVD,.  Lungs-diminished breath sounds, scattered wheezes bilaterally  CV- S1, S2 normal, irregular  Abd-  +ve B.Sounds, Abd Soft, No tenderness,    Extremity/Skin:- No  edema, pedal pulses present  Psych-affect is appropriate, oriented x3 Neuro-no new focal deficits, no tremors  Data Reviewed: I have personally reviewed following labs and imaging studies  CBC: Recent Labs  Lab 10/12/23 2131 10/13/23 0342  WBC 14.0* 13.6*  HGB 11.6* 11.0*  HCT 36.6 35.6*  MCV 89.9 89.2  PLT 358 311   Basic Metabolic Panel: Recent Labs  Lab 10/12/23 2131 10/13/23 0342  NA 137 136  K 3.7 4.3  CL 104 105  CO2 26 24  GLUCOSE 106* 199*  BUN 20 16  CREATININE 0.83 0.72  CALCIUM 8.5* 8.3*  MG  --  2.3  PHOS  --  3.3   GFR: Estimated Creatinine Clearance: 79.1 mL/min (by C-G formula based on SCr of 0.72 mg/dL). Liver Function Tests: Recent Labs  Lab 10/13/23 0342  AST 17  ALT 14  ALKPHOS 112  BILITOT 0.4  PROT 6.9  ALBUMIN 3.2*   Recent Results (from the past 240 hours)  Resp panel by RT-PCR (RSV, Flu A&B, Covid) Anterior Nasal Swab     Status: None   Collection Time: 10/12/23  9:39 PM   Specimen: Anterior Nasal Swab  Result Value Ref Range Status   SARS Coronavirus 2 by RT PCR NEGATIVE NEGATIVE Final    Comment: (NOTE) SARS-CoV-2 target  nucleic acids are NOT DETECTED.  The SARS-CoV-2 RNA is generally detectable in upper respiratory specimens during the acute phase of infection. The lowest concentration of SARS-CoV-2 viral copies this assay can detect is 138 copies/mL. A negative result does not preclude SARS-Cov-2 infection and should not be used as the sole basis for treatment or other patient management decisions. A negative result may occur with  improper specimen collection/handling, submission of specimen other than nasopharyngeal swab, presence  of viral mutation(s) within the areas targeted by this assay, and inadequate number of viral copies(<138 copies/mL). A negative result must be combined with clinical observations, patient history, and epidemiological information. The expected result is Negative.  Fact Sheet for Patients:  BloggerCourse.com  Fact Sheet for Healthcare Providers:  SeriousBroker.it  This test is no t yet approved or cleared by the [place]  FDA and  has been authorized for detection and/or diagnosis of SARS-CoV-2 by FDA under an Emergency Use Authorization (EUA). This EUA will remain  in effect (meaning this test can be used) for the duration of the COVID-19 declaration under Section 564(b)(1) of the Act, 21 U.S.C.section 360bbb-3(b)(1), unless the authorization is terminated  or revoked sooner.       Influenza A by PCR NEGATIVE NEGATIVE Final   Influenza B by PCR NEGATIVE NEGATIVE Final    Comment: (NOTE) The Xpert Xpress SARS-CoV-2/FLU/RSV plus assay is intended as an aid in the diagnosis of influenza from Nasopharyngeal swab specimens and should not be used as a sole basis for treatment. Nasal washings and aspirates are unacceptable for Xpert Xpress SARS-CoV-2/FLU/RSV testing.  Fact Sheet for Patients: BloggerCourse.com  Fact Sheet for Healthcare  Providers: SeriousBroker.it  This test is not yet approved or cleared by the [place]  FDA and has been authorized for detection and/or diagnosis of SARS-CoV-2 by FDA under an Emergency Use Authorization (EUA). This EUA will remain in effect (meaning this test can be used) for the duration of the COVID-19 declaration under Section 564(b)(1) of the Act, 21 U.S.C. section 360bbb-3(b)(1), unless the authorization is terminated or revoked.     Resp Syncytial Virus by PCR NEGATIVE NEGATIVE Final    Comment: (NOTE) Fact Sheet for Patients: BloggerCourse.com  Fact Sheet for Healthcare Providers: SeriousBroker.it  This test is not yet approved or cleared by the [place]  FDA and has been authorized for detection and/or diagnosis of SARS-CoV-2 by FDA under an Emergency Use Authorization (EUA). This EUA will remain in effect (meaning this test can be used) for the duration of the COVID-19 declaration under Section 564(b)(1) of the Act, 21 U.S.C. section 360bbb-3(b)(1), unless the authorization is terminated or revoked.  Performed at [facility], [address] [place], [place] [address]   Expectorated Sputum Assessment w Gram Stain, Rflx to Resp Cult     Status: None   Collection Time: 10/13/23  4:13 AM   Specimen: Expectorated Sputum  Result Value Ref Range Status   Specimen Description EXPECTORATED SPUTUM  Final   Special Requests NONE  Final   Sputum evaluation   Final    THIS SPECIMEN IS ACCEPTABLE FOR SPUTUM CULTURE Performed at [facility], [address], [place], [place] [address]    Report Status 10/13/2023 FINAL  Final  Culture, Respiratory w Gram Stain     Status: None (Preliminary result)   Collection Time: 10/13/23  4:13 AM  Result Value Ref Range Status   Specimen Description   Final    EXPECTORATED SPUTUM Performed at [facility], [address], [place], [place] [address]     Special Requests   Final    NONE Reflexed from [phone] Performed at [facility], [address] [place], [place] [address]    Gram Stain   Final    ABUNDANT WBC PRESENT, PREDOMINANTLY PMN NO ORGANISMS SEEN    Culture   Final    CULTURE REINCUBATED FOR BETTER GROWTH Performed at [facility], [address], [place], [place] [address]  Report Status PENDING  Incomplete  Culture, blood (Routine X 2) w Reflex to ID Panel     Status: None (Preliminary result)   Collection Time: 10/13/23  4:15 AM   Specimen: BLOOD  Result Value Ref Range Status   Specimen Description BLOOD RIGHT ANTECUBITAL  Final   Special Requests   Final    BOTTLES DRAWN AEROBIC AND ANAEROBIC Blood Culture results may not be optimal due to an inadequate volume of blood received in culture bottles   Culture  Setup Time   Final    GRAM POSITIVE COCCI AEROBIC BOTTLE ONLY Gram Stain Report Called to,Read Back By and Verified With: J. BERES ON 10/14/2023 @14 :38 BY T.HAMER Performed at [facility], [address], [place], [place] [address]    Culture [facility] POSITIVE COCCI  Final   Report Status PENDING  Incomplete  Culture, blood (Routine X 2) w Reflex to ID Panel     Status: None (Preliminary result)   Collection Time: 10/13/23  4:30 AM   Specimen: BLOOD  Result Value Ref Range Status   Specimen Description BLOOD BLOOD RIGHT HAND  Final   Special Requests   Final    BOTTLES DRAWN AEROBIC AND ANAEROBIC Blood Culture results may not be optimal due to an inadequate volume of blood received in culture bottles   Culture   Final    NO GROWTH 1 DAY Performed at [facility], [address], [place], [place] [address]    Report Status PENDING  Incomplete    Radiology Studies: DG Chest 2 View Result Date: 10/12/2023 CLINICAL DATA:  Short of breath, difficulty breathing EXAM: CHEST - 2 VIEW COMPARISON:  04/01/2021 FINDINGS: Frontal and lateral views of the chest demonstrate postsurgical changes from median sternotomy.  The cardiac silhouette remains enlarged. No acute airspace disease, effusion, or pneumothorax. No acute bony abnormalities. IMPRESSION: 1. Stable chest, no acute process. Electronically Signed   By: Bobbye Burrow M.D.   On: 10/12/2023 21:23   Scheduled Meds:  arformoterol   15 mcg Nebulization BID   And   umeclidinium bromide   1 puff Inhalation Daily   aspirin  EC  81 mg Oral Daily   dabigatran   150 mg Oral BID   dextromethorphan -guaiFENesin   1 tablet Oral BID   doxycycline   100 mg Oral Q12H   ipratropium-albuterol   3 mL Nebulization Q6H   levothyroxine   25 mcg Oral Q0600   methylPREDNISolone  (SOLU-MEDROL ) injection  40 mg Intravenous Q12H   metoprolol  tartrate  25 mg Oral BID   pantoprazole   40 mg Oral Daily   Continuous Infusions:   LOS: 0 days   Colin Dawley M.D on 10/14/2023 at 4:14 PM  Go to [email or web address] - for contact info  [facility] - Office  [phone]  If 7PM-7AM, please contact night-coverage [email or web address] 10/14/2023, 4:14 PM

## 2023-10-14 NOTE — Progress Notes (Signed)
 Patient eating breakfast unavailable for nebulizer treatment at this time.

## 2023-10-14 NOTE — Plan of Care (Signed)
  Problem: Education: Goal: Knowledge of General Education information will improve Description: Including pain rating scale, medication(s)/side effects and non-pharmacologic comfort measures Outcome: Progressing   Problem: Clinical Measurements: Goal: Ability to maintain clinical measurements within normal limits will improve Outcome: Progressing Goal: Will remain free from infection Outcome: Progressing Goal: Diagnostic test results will improve Outcome: Progressing Goal: Respiratory complications will improve Outcome: Progressing Goal: Cardiovascular complication will be avoided Outcome: Progressing   Problem: Pain Managment: Goal: General experience of comfort will improve and/or be controlled Outcome: Progressing

## 2023-10-15 DIAGNOSIS — J441 Chronic obstructive pulmonary disease with (acute) exacerbation: Secondary | ICD-10-CM | POA: Diagnosis not present

## 2023-10-15 LAB — BLOOD CULTURE ID PANEL (REFLEXED) - BCID2

## 2023-10-15 LAB — CULTURE, RESPIRATORY W GRAM STAIN: Culture: NORMAL

## 2023-10-15 LAB — GLUCOSE, CAPILLARY
Glucose-Capillary: 157 mg/dL — ABNORMAL HIGH (ref 70–99)
Glucose-Capillary: 158 mg/dL — ABNORMAL HIGH (ref 70–99)

## 2023-10-15 NOTE — Progress Notes (Signed)
 PROGRESS NOTE   Kathleen Wright, is a 58 y.o. female, DOB - Nov 08, 1965, ZOX:[id number]  Admit date - 10/12/2023   Admitting Physician Kathleen Ragin Quintella Buck, MD  Outpatient Primary MD for the patient is Kathleen Parkins, MD  LOS - 1  Chief Complaint  Patient presents with   Shortness of Breath      Brief Narrative:  58 y.o. female with medical history significant of COPD, asthma, hypertrophic cardiomyopathy, paroxysmal atrial fibrillation admitted on 10/12/2023 with acute COPD exacerbation    -Assessment and Plan: 1) acute COPD exacerbation--no definite pneumonia on chest x-ray and clinically -10/15/23--patient ambulated with staff had significant cough dyspnea and desaturation on room air while ambulating --Respiratory status overall improving though- -continue IV Solu-Medrol ,  bronchodilators, mucolytic's - c/n doxycycline , avoid azithromycin  due to QT prolongation concerns  2)HFpEF----echo with grade 3 diastolic dysfunction, EF is 55 to 50%, no regional wall motion abnormalities,, mild pulmonary artery hypertension noted - History of hypertrophic cardiomyopathy --- Pete echo showed there is moderate LVH including septal hypertrophy related to RVH. Patient reported to be status post septal myomectomy  - Continue metoprolol , hold off on diuretics  3)PAFib--- continue Pradaxa  for stroke prophylaxis and metoprolol  for rate control - Hold Tikosyn  due prolonged QT concerns with risk for torsades de pointes -Potassium was 4.3, Mag was 2.3 and phosphorus was 3.3 -Repeat EKG shows improving QTc, okay to resume Tikosyn  on 10/16/2023--she has to follow-up with her cardiologist as outpatient to discuss possible adjustments to her Tikosyn  - 4)Hypothyroidism--- continue levothyroxine   5) acute hypoxic respiratory failure--due to #1 above -10/15/23--patient ambulated with staff had significant cough dyspnea and desaturation on room air while ambulating -Currently on 2 L of oxygen  via nasal cannula -  Anticipate resolution with treatment of #1 above  6)Staph Epi bacteremia----Blood cultures from 10/13/2023 with gram-positive cocci in aerobic bottle only (1 of 4 bottles) --??  Contaminant -Repeat blood cultures from 10/14/2023 NGTD - No further antibiotics after Rocephin  dose of 10/15/2023  7) chronic pain syndrome--- continue PTA opioids  Status is: Inpatient   Disposition: The patient is from: Home              Anticipated d/c is to: Home              Anticipated d/c date is: 1 day              Patient currently is not medically stable to d/c. Barriers: Not Clinically Stable-   Code Status :  -  Code Status: Full Code   Family Communication:    NA (patient is alert, awake and coherent)   DVT Prophylaxis  :   - SCDs  SCDs Start: 10/13/23 0316 dabigatran  (PRADAXA ) capsule 150 mg   Lab Results  Component Value Date   PLT 311 10/13/2023   Inpatient Medications  Scheduled Meds:  arformoterol   15 mcg Nebulization BID   And   umeclidinium bromide   1 puff Inhalation Daily   aspirin  EC  81 mg Oral Daily   dabigatran   150 mg Oral BID   dextromethorphan -guaiFENesin   1 tablet Oral BID   doxycycline   100 mg Oral Q12H   ipratropium-albuterol   3 mL Nebulization Q6H   levothyroxine   25 mcg Oral Q0600   methylPREDNISolone  (SOLU-MEDROL ) injection  40 mg Intravenous Q12H   metoprolol  tartrate  25 mg Oral BID   pantoprazole   40 mg Oral Daily   Continuous Infusions:  cefTRIAXone  (ROCEPHIN )  IV 2 g (10/14/23 2024)   PRN Meds:.acetaminophen  **OR** acetaminophen , guaiFENesin -dextromethorphan ,  ipratropium-albuterol , ondansetron  **OR** ondansetron  (ZOFRAN ) IV, oxyCODONE , polyvinyl alcohol   Anti-infectives (From admission, onward)    Start     Dose/Rate Route Frequency Ordered Stop   10/14/23 1930  cefTRIAXone  (ROCEPHIN ) 2 g in sodium chloride  0.9 % 100 mL IVPB        2 g 200 mL/hr over 30 Minutes Intravenous Every 24 hours 10/14/23 1836 10/15/23 2359   10/13/23 2200  doxycycline   (VIBRA -TABS) tablet 100 mg        100 mg Oral Every 12 hours 10/13/23 1900     10/13/23 1000  doxycycline  (VIBRAMYCIN ) 100 mg in sodium chloride  0.9 % 250 mL IVPB  Status:  Discontinued        100 mg 125 mL/hr over 120 Minutes Intravenous Every 12 hours 10/13/23 0426 10/13/23 1900   10/12/23 2300  cefTRIAXone  (ROCEPHIN ) 1 g in sodium chloride  0.9 % 100 mL IVPB        1 g 200 mL/hr over 30 Minutes Intravenous  Once 10/12/23 2250 10/12/23 2350   10/12/23 2300  azithromycin  (ZITHROMAX ) 500 mg in sodium chloride  0.9 % 250 mL IVPB  Status:  Discontinued        500 mg 250 mL/hr over 60 Minutes Intravenous Every 24 hours 10/12/23 2250 10/13/23 0426        Subjective: Kathleen Wright today has no fevers, no emesis,  No chest pain,   - - -10/15/23--patient ambulated with staff had significant cough dyspnea and desaturation on room air while ambulating - Patient placed back on oxygen  Objective: Vitals:   10/15/23 0809 10/15/23 0813 10/15/23 1253 10/15/23 1425  BP:   127/83   Pulse:   (!) 53   Resp:      Temp:   98 F (36.7 C)   TempSrc:   Oral   SpO2: 98% 98% 97% 92%  Weight:      Height:        Intake/Output Summary (Last 24 hours) at 10/15/2023 1834 Last data filed at 10/15/2023 1700 Gross per 24 hour  Intake 820 ml  Output 1250 ml  Net -430 ml   Filed Weights   10/13/23 1129 10/14/23 0505 10/15/23 0513  Weight: 90.7 kg 93.7 kg 94.5 kg    Physical Exam  Gen:- Awake Alert, no acute distress HEENT:- [place].AT, No sclera icterus Nose- [place] 2L/min (New) Neck-Supple Neck,No JVD,.  Lungs-diminished breath sounds, scattered wheezes bilaterally  CV- S1, S2 normal, irregular  Abd-  +ve B.Sounds, Abd Soft, No tenderness,    Extremity/Skin:- No  edema, pedal pulses present  Psych-affect is appropriate, oriented x3 Neuro-no new focal deficits, no tremors  Data Reviewed: I have personally reviewed following labs and imaging studies  CBC: Recent Labs  Lab 10/12/23 2131 10/13/23 0342   WBC 14.0* 13.6*  HGB 11.6* 11.0*  HCT 36.6 35.6*  MCV 89.9 89.2  PLT 358 311   Basic Metabolic Panel: Recent Labs  Lab 10/12/23 2131 10/13/23 0342  NA 137 136  K 3.7 4.3  CL 104 105  CO2 26 24  GLUCOSE 106* 199*  BUN 20 16  CREATININE 0.83 0.72  CALCIUM 8.5* 8.3*  MG  --  2.3  PHOS  --  3.3   GFR: Estimated Creatinine Clearance: 79.5 mL/min (by C-G formula based on SCr of 0.72 mg/dL). Liver Function Tests: Recent Labs  Lab 10/13/23 0342  AST 17  ALT 14  ALKPHOS 112  BILITOT 0.4  PROT 6.9  ALBUMIN 3.2*   Recent Results (from the past  240 hours)  Resp panel by RT-PCR (RSV, Flu A&B, Covid) Anterior Nasal Swab     Status: None   Collection Time: 10/12/23  9:39 PM   Specimen: Anterior Nasal Swab  Result Value Ref Range Status   SARS Coronavirus 2 by RT PCR NEGATIVE NEGATIVE Final    Comment: (NOTE) SARS-CoV-2 target nucleic acids are NOT DETECTED.  The SARS-CoV-2 RNA is generally detectable in upper respiratory specimens during the acute phase of infection. The lowest concentration of SARS-CoV-2 viral copies this assay can detect is 138 copies/mL. A negative result does not preclude SARS-Cov-2 infection and should not be used as the sole basis for treatment or other patient management decisions. A negative result may occur with  improper specimen collection/handling, submission of specimen other than nasopharyngeal swab, presence of viral mutation(s) within the areas targeted by this assay, and inadequate number of viral copies(<138 copies/mL). A negative result must be combined with clinical observations, patient history, and epidemiological information. The expected result is Negative.  Fact Sheet for Patients:  BloggerCourse.com  Fact Sheet for Healthcare Providers:  SeriousBroker.it  This test is no t yet approved or cleared by the [place]  FDA and  has been authorized for detection and/or diagnosis  of SARS-CoV-2 by FDA under an Emergency Use Authorization (EUA). This EUA will remain  in effect (meaning this test can be used) for the duration of the COVID-19 declaration under Section 564(b)(1) of the Act, 21 U.S.C.section 360bbb-3(b)(1), unless the authorization is terminated  or revoked sooner.       Influenza A by PCR NEGATIVE NEGATIVE Final   Influenza B by PCR NEGATIVE NEGATIVE Final    Comment: (NOTE) The Xpert Xpress SARS-CoV-2/FLU/RSV plus assay is intended as an aid in the diagnosis of influenza from Nasopharyngeal swab specimens and should not be used as a sole basis for treatment. Nasal washings and aspirates are unacceptable for Xpert Xpress SARS-CoV-2/FLU/RSV testing.  Fact Sheet for Patients: BloggerCourse.com  Fact Sheet for Healthcare Providers: SeriousBroker.it  This test is not yet approved or cleared by the [place]  FDA and has been authorized for detection and/or diagnosis of SARS-CoV-2 by FDA under an Emergency Use Authorization (EUA). This EUA will remain in effect (meaning this test can be used) for the duration of the COVID-19 declaration under Section 564(b)(1) of the Act, 21 U.S.C. section 360bbb-3(b)(1), unless the authorization is terminated or revoked.     Resp Syncytial Virus by PCR NEGATIVE NEGATIVE Final    Comment: (NOTE) Fact Sheet for Patients: BloggerCourse.com  Fact Sheet for Healthcare Providers: SeriousBroker.it  This test is not yet approved or cleared by the [place]  FDA and has been authorized for detection and/or diagnosis of SARS-CoV-2 by FDA under an Emergency Use Authorization (EUA). This EUA will remain in effect (meaning this test can be used) for the duration of the COVID-19 declaration under Section 564(b)(1) of the Act, 21 U.S.C. section 360bbb-3(b)(1), unless the authorization is terminated  or revoked.  Performed at [facility], [address] [place], [place] [address]   Expectorated Sputum Assessment w Gram Stain, Rflx to Resp Cult     Status: None   Collection Time: 10/13/23  4:13 AM   Specimen: Expectorated Sputum  Result Value Ref Range Status   Specimen Description EXPECTORATED SPUTUM  Final   Special Requests NONE  Final   Sputum evaluation   Final    THIS SPECIMEN IS ACCEPTABLE FOR SPUTUM CULTURE Performed at [facility], [address], [place], [place] [address]  Report Status 10/13/2023 FINAL  Final  Culture, Respiratory w Gram Stain     Status: None   Collection Time: 10/13/23  4:13 AM  Result Value Ref Range Status   Specimen Description   Final    EXPECTORATED SPUTUM Performed at [facility], [address] [place], [place] [address]    Special Requests   Final    NONE Reflexed from [id number] Performed at [facility], [address], [place], [place] [address]    Gram Stain   Final    ABUNDANT WBC PRESENT, PREDOMINANTLY PMN NO ORGANISMS SEEN    Culture   Final    RARE Normal respiratory flora-no Staph aureus or Pseudomonas seen Performed at [facility], [address] [place], [place] [address]    Report Status 10/15/2023 FINAL  Final  Culture, blood (Routine X 2) w Reflex to ID Panel     Status: None (Preliminary result)   Collection Time: 10/13/23  4:15 AM   Specimen: BLOOD  Result Value Ref Range Status   Specimen Description   Final    BLOOD RIGHT ANTECUBITAL Performed at [facility], [address] [place], [place] [address]    Special Requests   Final    BOTTLES DRAWN AEROBIC AND ANAEROBIC Blood Culture results may not be optimal due to an inadequate volume of blood received in culture bottles Performed at [facility], [address], [place], [place] [address]    Culture  Setup Time   Final    GRAM POSITIVE COCCI AEROBIC BOTTLE ONLY Gram Stain Report Called to,Read Back By and Verified With: J. BERES ON 10/14/2023 @14 :38 BY  T.HAMER CRITICAL RESULT CALLED TO, READ BACK BY AND VERIFIED WITH: S WADE RN 10/15/2023 @ 0129 BY AB    Culture   Final    GRAM POSITIVE COCCI TOO YOUNG TO READ Performed at [facility], [address], [place], [place] [address]    Report Status PENDING  Incomplete  Blood Culture ID Panel (Reflexed)     Status: Abnormal   Collection Time: 10/13/23  4:15 AM  Result Value Ref Range Status   Enterococcus faecalis NOT DETECTED NOT DETECTED Final   Enterococcus Faecium NOT DETECTED NOT DETECTED Final   Listeria monocytogenes NOT DETECTED NOT DETECTED Final   Staphylococcus species DETECTED (A) NOT DETECTED Final    Comment: CRITICAL RESULT CALLED TO, READ BACK BY AND VERIFIED WITH: S WADE RN 10/15/2023 @ 0129 BY AB    Staphylococcus aureus (BCID) NOT DETECTED NOT DETECTED Final   Staphylococcus epidermidis DETECTED (A) NOT DETECTED Final    Comment: Methicillin (oxacillin) resistant coagulase negative staphylococcus. Possible blood culture contaminant (unless isolated from more than one blood culture draw or clinical case suggests pathogenicity). No antibiotic treatment is indicated for blood  culture contaminants. CRITICAL RESULT CALLED TO, READ BACK BY AND VERIFIED WITH: S WADE RN 10/15/2023 @ 0129 BY AB    Staphylococcus lugdunensis NOT DETECTED NOT DETECTED Final   Streptococcus species NOT DETECTED NOT DETECTED Final   Streptococcus agalactiae NOT DETECTED NOT DETECTED Final   Streptococcus pneumoniae NOT DETECTED NOT DETECTED Final   Streptococcus pyogenes NOT DETECTED NOT DETECTED Final   A.calcoaceticus-baumannii NOT DETECTED NOT DETECTED Final   Bacteroides fragilis NOT DETECTED NOT DETECTED Final   Enterobacterales NOT DETECTED NOT DETECTED Final   Enterobacter cloacae complex NOT DETECTED NOT DETECTED Final   Escherichia coli NOT DETECTED NOT DETECTED Final   Klebsiella aerogenes NOT DETECTED NOT DETECTED Final  Klebsiella oxytoca NOT DETECTED NOT DETECTED Final    Klebsiella pneumoniae NOT DETECTED NOT DETECTED Final   Proteus species NOT DETECTED NOT DETECTED Final   Salmonella species NOT DETECTED NOT DETECTED Final   Serratia marcescens NOT DETECTED NOT DETECTED Final   Haemophilus influenzae NOT DETECTED NOT DETECTED Final   Neisseria meningitidis NOT DETECTED NOT DETECTED Final   Pseudomonas aeruginosa NOT DETECTED NOT DETECTED Final   Stenotrophomonas maltophilia NOT DETECTED NOT DETECTED Final   Candida albicans NOT DETECTED NOT DETECTED Final   Candida auris NOT DETECTED NOT DETECTED Final   Candida glabrata NOT DETECTED NOT DETECTED Final   Candida krusei NOT DETECTED NOT DETECTED Final   Candida parapsilosis NOT DETECTED NOT DETECTED Final   Candida tropicalis NOT DETECTED NOT DETECTED Final   Cryptococcus neoformans/gattii NOT DETECTED NOT DETECTED Final   Methicillin resistance mecA/C DETECTED (A) NOT DETECTED Final    Comment: CRITICAL RESULT CALLED TO, READ BACK BY AND VERIFIED WITH: S WADE RN 10/15/2023 @ 0129 BY AB Performed at [facility], [address], [place], [place] [address]   Culture, blood (Routine X 2) w Reflex to ID Panel     Status: None (Preliminary result)   Collection Time: 10/13/23  4:30 AM   Specimen: BLOOD  Result Value Ref Range Status   Specimen Description BLOOD BLOOD RIGHT HAND  Final   Special Requests   Final    BOTTLES DRAWN AEROBIC AND ANAEROBIC Blood Culture results may not be optimal due to an inadequate volume of blood received in culture bottles   Culture   Final    NO GROWTH 2 DAYS Performed at [facility], [address] [place], [place] [address]    Report Status PENDING  Incomplete  Culture, blood (Routine X 2) w Reflex to ID Panel     Status: None (Preliminary result)   Collection Time: 10/14/23  7:10 PM   Specimen: BLOOD  Result Value Ref Range Status   Specimen Description BLOOD BLOOD RIGHT HAND  Final   Special Requests BOTTLES DRAWN AEROBIC ONLY  Final   Culture   Final    NO  GROWTH < 24 HOURS Performed at [facility], [address] [place], [place] [address]    Report Status PENDING  Incomplete  Culture, blood (Routine X 2) w Reflex to ID Panel     Status: None (Preliminary result)   Collection Time: 10/14/23  7:20 PM   Specimen: BLOOD  Result Value Ref Range Status   Specimen Description BLOOD BLOOD LEFT ARM  Final   Special Requests BOTTLES DRAWN AEROBIC AND ANAEROBIC  Final   Culture   Final    NO GROWTH < 24 HOURS Performed at [facility], [address], [place], [place] [address]    Report Status PENDING  Incomplete    Radiology Studies: No results found.  Scheduled Meds:  arformoterol   15 mcg Nebulization BID   And   umeclidinium bromide   1 puff Inhalation Daily   aspirin  EC  81 mg Oral Daily   dabigatran   150 mg Oral BID   dextromethorphan -guaiFENesin   1 tablet Oral BID   doxycycline   100 mg Oral Q12H   ipratropium-albuterol   3 mL Nebulization Q6H   levothyroxine   25 mcg Oral Q0600   methylPREDNISolone  (SOLU-MEDROL ) injection  40 mg Intravenous Q12H   metoprolol  tartrate  25 mg Oral BID   pantoprazole   40 mg Oral Daily   Continuous Infusions:  cefTRIAXone  (ROCEPHIN )  IV 2 g (  10/14/23 2024)     LOS: 1 day   Colin Dawley M.D on 10/15/2023 at 6:34 PM  Go to [email or web address] - for contact info  [facility] - Office  [phone]  If 7PM-7AM, please contact night-coverage [email or web address] 10/15/2023, 6:34 PM

## 2023-10-15 NOTE — Plan of Care (Signed)

## 2023-10-15 NOTE — Progress Notes (Signed)
 Date and time results received: 10/15/23 @ 0127   Test: blood cultures Critical Value: 1/4 grew staph epi (meca-c resistant)  Name of Provider Notified: Adefeso  Orders Received? Or Actions Taken?: pending orders

## 2023-10-16 DIAGNOSIS — J441 Chronic obstructive pulmonary disease with (acute) exacerbation: Secondary | ICD-10-CM | POA: Diagnosis not present

## 2023-10-16 DIAGNOSIS — R9431 Abnormal electrocardiogram [ECG] [EKG]: Secondary | ICD-10-CM

## 2023-10-16 LAB — CULTURE, BLOOD (ROUTINE X 2): Culture  Setup Time: NO GROWTH

## 2023-10-16 LAB — GLUCOSE, CAPILLARY
Glucose-Capillary: 125 mg/dL — ABNORMAL HIGH (ref 70–99)
Glucose-Capillary: 125 mg/dL — ABNORMAL HIGH (ref 70–99)
Glucose-Capillary: 158 mg/dL — ABNORMAL HIGH (ref 70–99)
Glucose-Capillary: 159 mg/dL — ABNORMAL HIGH (ref 70–99)

## 2023-10-16 LAB — LEGIONELLA PNEUMOPHILA SEROGP 1 UR AG
L. pneumophila Serogp 1 Ur Ag: NEGATIVE
Source of Sample: 0

## 2023-10-16 MED ORDER — PREDNISONE 20 MG PO TABS: 40.0000 mg | ORAL_TABLET | Freq: Every day | ORAL | 0 refills | Status: DC

## 2023-10-16 MED ORDER — SODIUM CHLORIDE 0.9% FLUSH
3.0000 mL | INTRAVENOUS | Status: DC | PRN
  Administered 2023-10-16: 3 mL via INTRAVENOUS

## 2023-10-16 MED ORDER — DOXYCYCLINE HYCLATE 100 MG PO TABS: 100.0000 mg | ORAL_TABLET | Freq: Two times a day (BID) | ORAL | 0 refills | Status: AC

## 2023-10-16 MED ORDER — SODIUM CHLORIDE 0.9 % IV SOLN: 250.0000 mL | INTRAVENOUS | Status: AC | PRN

## 2023-10-16 MED ORDER — ACETAMINOPHEN 325 MG PO TABS: 650.0000 mg | ORAL_TABLET | Freq: Four times a day (QID) | ORAL | Status: AC | PRN

## 2023-10-16 MED ORDER — ASPIRIN EC 81 MG PO TBEC: 81.0000 mg | DELAYED_RELEASE_TABLET | Freq: Every day | ORAL | 11 refills | Status: DC

## 2023-10-16 MED ORDER — PROAIR HFA 108 (90 BASE) MCG/ACT IN AERS: 2.0000 | INHALATION_SPRAY | RESPIRATORY_TRACT | 4 refills | Status: AC | PRN

## 2023-10-16 MED ORDER — STIOLTO RESPIMAT 2.5-2.5 MCG/ACT IN AERS: 2.0000 | INHALATION_SPRAY | Freq: Every day | RESPIRATORY_TRACT | 2 refills | Status: AC

## 2023-10-16 MED ORDER — POTASSIUM CHLORIDE ER 10 MEQ PO TBCR: 10.0000 meq | EXTENDED_RELEASE_TABLET | ORAL | 2 refills | Status: DC

## 2023-10-16 MED ORDER — OMEPRAZOLE 40 MG PO CPDR: 40.0000 mg | DELAYED_RELEASE_CAPSULE | Freq: Every day | ORAL | 5 refills | Status: DC

## 2023-10-16 MED ORDER — ALBUTEROL SULFATE (2.5 MG/3ML) 0.083% IN NEBU: 2.5000 mg | INHALATION_SOLUTION | Freq: Four times a day (QID) | RESPIRATORY_TRACT | 5 refills | Status: DC | PRN

## 2023-10-16 MED ORDER — DOFETILIDE 125 MCG PO CAPS
250.0000 ug | ORAL_CAPSULE | Freq: Two times a day (BID) | ORAL | Status: DC
Start: 2023-10-16 — End: 2023-10-16
  Administered 2023-10-16: 250 ug via ORAL
  Filled 2023-10-16 (×3): qty 2

## 2023-10-16 MED ORDER — TORSEMIDE 20 MG PO TABS: 40.0000 mg | ORAL_TABLET | Freq: Every day | ORAL | 1 refills | Status: AC | PRN

## 2023-10-16 MED ORDER — SODIUM CHLORIDE 0.9% FLUSH
3.0000 mL | Freq: Two times a day (BID) | INTRAVENOUS | Status: DC
  Administered 2023-10-16 – 2023-10-18 (×4): 3 mL via INTRAVENOUS

## 2023-10-16 MED ORDER — DOFETILIDE 250 MCG PO CAPS
250.0000 ug | ORAL_CAPSULE | Freq: Two times a day (BID) | ORAL | Status: DC
  Filled 2023-10-16 (×3): qty 1

## 2023-10-16 MED ORDER — METOPROLOL TARTRATE 25 MG PO TABS: 25.0000 mg | ORAL_TABLET | Freq: Two times a day (BID) | ORAL | 6 refills | Status: DC

## 2023-10-16 MED ORDER — ALBUTEROL SULFATE (2.5 MG/3ML) 0.083% IN NEBU: 2.5000 mg | INHALATION_SOLUTION | RESPIRATORY_TRACT | 2 refills | Status: AC | PRN

## 2023-10-16 MED ORDER — DOFETILIDE 125 MCG PO CAPS
250.0000 ug | ORAL_CAPSULE | Freq: Two times a day (BID) | ORAL | Status: DC
  Administered 2023-10-16 – 2023-10-18 (×4): 250 ug via ORAL
  Filled 2023-10-16 (×6): qty 2

## 2023-10-16 NOTE — Plan of Care (Signed)

## 2023-10-16 NOTE — Progress Notes (Signed)
 Mobility Specialist Progress Note:    10/16/23 0900  Mobility  Activity Ambulated with assistance in hallway  Level of Assistance Modified independent, requires aide device or extra time  Assistive Device None  Distance Ambulated (ft) 30 ft  Range of Motion/Exercises Active;All extremities  Activity Response Tolerated well  Mobility Referral Yes  Mobility visit 1 Mobility  Mobility Specialist Start Time (ACUTE ONLY) 0900  Mobility Specialist Stop Time (ACUTE ONLY) 0920  Mobility Specialist Time Calculation (min) (ACUTE ONLY) 20 min   Pt received in bed, agreeable to mobility. ModI to stand and ambulate with no AD. Tolerated well, SpO2 86-88% on RA during ambulation. Returned pt sitting EOB, all needs met.  Shirl Ludington Mobility Specialist Please contact via Special educational needs teacher or  Rehab office at [phone]

## 2023-10-16 NOTE — Progress Notes (Signed)
 PROGRESS NOTE   Kathleen Wright, is a 58 y.o. female, DOB - [birth year], WUJ:[id number]  Admit date - 10/12/2023   Admitting Physician Larri Brewton Quintella Buck, MD  Outpatient Primary MD for the patient is Kathleen Parkins, MD  LOS - 2  Chief Complaint  Patient presents with   Shortness of Breath      Brief Narrative:  58 y.o. female with medical history significant of COPD, asthma, hypertrophic cardiomyopathy, paroxysmal atrial fibrillation admitted on 10/12/2023 with acute COPD exacerbation    -Assessment and Plan: 1) acute COPD exacerbation--no definite pneumonia on chest x-ray and clinically -10/16/23--patient ambulated with staff had significant cough dyspnea and desaturation on room air while ambulating --Respiratory status overall improving though- -continue IV Solu-Medrol ,  bronchodilators, mucolytic's - c/n doxycycline , avoid azithromycin  due to QT prolongation concerns  2)HFpEF----echo with grade 3 diastolic dysfunction, EF is 55 to 50%, no regional wall motion abnormalities,, mild pulmonary artery hypertension noted - History of hypertrophic cardiomyopathy --- Pete echo showed there is moderate LVH including septal hypertrophy related to RVH. Patient reported to be status post septal myomectomy  - Continue metoprolol , hold off on diuretics  3)PAFib--- continue Pradaxa  for stroke prophylaxis and metoprolol  for rate control - Initially we Held Tikosyn  due prolonged QT concerns  -Potassium was 4.3, Mag was 2.3 and phosphorus was 3.3 -Repeat EKG shows improving QTc,  -- Discussed with Dr. Arthea Larsson  who spoke with Dr. Carolynne Citron from EP  --- She has been restarted on Tikosyn   -check EKG couple of hours after each dose of Tikosyn  while restarting Tikosyn  -Tikosyn  reinitiation order set used Keep potassium around >4 and magnesium  around >2  -she has to follow-up with her cardiologist as outpatient to discuss possible adjustments to her Tikosyn  --Possible discharge home in 24 to 48 hours if EKG  especially QT segment remains reassuring - 4)Hypothyroidism--- continue levothyroxine   5) acute hypoxic respiratory failure--due to #1 above -10/16/23--patient ambulated with staff had significant cough dyspnea and desaturation on room air while ambulating -Currently on 2 L of oxygen  via nasal cannula - Anticipate discharge on home O2  6)Staph Epi bacteremia----Blood cultures from 10/13/2023 with gram-positive cocci in aerobic bottle only (1 of 4 bottles) --??  Contaminant -Repeat blood cultures from 10/14/2023 NGTD - No further antibiotics after Rocephin  dose of 10/15/2023  7) chronic pain syndrome--- continue PTA opioids  Status is: Inpatient   Disposition: The patient is from: Home              Anticipated d/c is to: Home              Anticipated d/c date is: 1 day              Patient currently is not medically stable to d/c. Barriers: Not Clinically Stable-   Code Status :  -  Code Status: Full Code   Family Communication:    NA (patient is alert, awake and coherent)   DVT Prophylaxis  :   - SCDs  SCDs Start: 10/13/23 0316 dabigatran  (PRADAXA ) capsule 150 mg   Lab Results  Component Value Date   PLT 311 10/13/2023   Inpatient Medications  Scheduled Meds:  arformoterol   15 mcg Nebulization BID   And   umeclidinium bromide   1 puff Inhalation Daily   aspirin  EC  81 mg Oral Daily   dabigatran   150 mg Oral BID   dextromethorphan -guaiFENesin   1 tablet Oral BID   dofetilide   250 mcg Oral BID   doxycycline   100 mg Oral Q12H  ipratropium-albuterol   3 mL Nebulization Q6H   levothyroxine   25 mcg Oral Q0600   methylPREDNISolone  (SOLU-MEDROL ) injection  40 mg Intravenous Q12H   metoprolol  tartrate  25 mg Oral BID   pantoprazole   40 mg Oral Daily   sodium chloride  flush  3 mL Intravenous Q12H   Continuous Infusions:  sodium chloride      PRN Meds:.sodium chloride , acetaminophen  **OR** acetaminophen , guaiFENesin -dextromethorphan , ipratropium-albuterol , ondansetron  **OR**  ondansetron  (ZOFRAN ) IV, oxyCODONE , polyvinyl alcohol, sodium chloride  flush   Anti-infectives (From admission, onward)    Start     Dose/Rate Route Frequency Ordered Stop   10/16/23 0000  doxycycline  (VIBRA -TABS) 100 MG tablet        100 mg Oral 2 times daily 10/16/23 1230 10/21/23 2359   10/14/23 1930  cefTRIAXone  (ROCEPHIN ) 2 g in sodium chloride  0.9 % 100 mL IVPB        2 g 200 mL/hr over 30 Minutes Intravenous Every 24 hours 10/14/23 1836 10/15/23 2236   10/13/23 2200  doxycycline  (VIBRA -TABS) tablet 100 mg        100 mg Oral Every 12 hours 10/13/23 1900     10/13/23 1000  doxycycline  (VIBRAMYCIN ) 100 mg in sodium chloride  0.9 % 250 mL IVPB  Status:  Discontinued        100 mg 125 mL/hr over 120 Minutes Intravenous Every 12 hours 10/13/23 0426 10/13/23 1900   10/12/23 2300  cefTRIAXone  (ROCEPHIN ) 1 g in sodium chloride  0.9 % 100 mL IVPB        1 g 200 mL/hr over 30 Minutes Intravenous  Once 10/12/23 2250 10/12/23 2350   10/12/23 2300  azithromycin  (ZITHROMAX ) 500 mg in sodium chloride  0.9 % 250 mL IVPB  Status:  Discontinued        500 mg 250 mL/hr over 60 Minutes Intravenous Every 24 hours 10/12/23 2250 10/13/23 0426        Subjective: Lavon Pound today has no fevers, no emesis,  No chest pain,   - - -10/16/23--patient ambulated with staff had significant cough dyspnea and desaturation on room air while ambulating --Patient's daughter Crystal and patient's granddaughter Eilleen Grates at bedside, questions answered - Cough and dyspnea improving  Objective: Vitals:   10/16/23 0418 10/16/23 0706 10/16/23 1259 10/16/23 1502  BP: (!) 141/68   139/75  Pulse: 60   (!) 53  Resp:      Temp: 98 F (36.7 C)   98 F (36.7 C)  TempSrc: Oral   Oral  SpO2: 100% 99% 99% 95%  Weight: 94.8 kg     Height:        Intake/Output Summary (Last 24 hours) at 10/16/2023 1701 Last data filed at 10/16/2023 0423 Gross per 24 hour  Intake --  Output 1300 ml  Net -1300 ml   Filed Weights    10/14/23 0505 10/15/23 0513 10/16/23 0418  Weight: 93.7 kg 94.5 kg 94.8 kg    Physical Exam  Gen:- Awake Alert, no acute distress HEENT:- [place].AT, No sclera icterus Nose- [place] 2L/min (New) Neck-Supple Neck,No JVD,.  Lungs--improving air movement, improving wheezing  CV- S1, S2 normal, irregular  Abd-  +ve B.Sounds, Abd Soft, No tenderness,    Extremity/Skin:- No  edema, pedal pulses present  Psych-affect is appropriate, oriented x3 Neuro-no new focal deficits, no tremors  Data Reviewed: I have personally reviewed following labs and imaging studies  CBC: Recent Labs  Lab 10/12/23 2131 10/13/23 0342  WBC 14.0* 13.6*  HGB 11.6* 11.0*  HCT 36.6 35.6*  MCV  89.9 89.2  PLT 358 311   Basic Metabolic Panel: Recent Labs  Lab 10/12/23 2131 10/13/23 0342  NA 137 136  K 3.7 4.3  CL 104 105  CO2 26 24  GLUCOSE 106* 199*  BUN 20 16  CREATININE 0.83 0.72  CALCIUM 8.5* 8.3*  MG  --  2.3  PHOS  --  3.3   GFR: Estimated Creatinine Clearance: 79.6 mL/min (by C-G formula based on SCr of 0.72 mg/dL). Liver Function Tests: Recent Labs  Lab 10/13/23 0342  AST 17  ALT 14  ALKPHOS 112  BILITOT 0.4  PROT 6.9  ALBUMIN 3.2*   Recent Results (from the past 240 hours)  Resp panel by RT-PCR (RSV, Flu A&B, Covid) Anterior Nasal Swab     Status: None   Collection Time: 10/12/23  9:39 PM   Specimen: Anterior Nasal Swab  Result Value Ref Range Status   SARS Coronavirus 2 by RT PCR NEGATIVE NEGATIVE Final    Comment: (NOTE) SARS-CoV-2 target nucleic acids are NOT DETECTED.  The SARS-CoV-2 RNA is generally detectable in upper respiratory specimens during the acute phase of infection. The lowest concentration of SARS-CoV-2 viral copies this assay can detect is 138 copies/mL. A negative result does not preclude SARS-Cov-2 infection and should not be used as the sole basis for treatment or other patient management decisions. A negative result may occur with  improper specimen  collection/handling, submission of specimen other than nasopharyngeal swab, presence of viral mutation(s) within the areas targeted by this assay, and inadequate number of viral copies(<138 copies/mL). A negative result must be combined with clinical observations, patient history, and epidemiological information. The expected result is Negative.  Fact Sheet for Patients:  BloggerCourse.com  Fact Sheet for Healthcare Providers:  SeriousBroker.it  This test is no t yet approved or cleared by the [place]  FDA and  has been authorized for detection and/or diagnosis of SARS-CoV-2 by FDA under an Emergency Use Authorization (EUA). This EUA will remain  in effect (meaning this test can be used) for the duration of the COVID-19 declaration under Section 564(b)(1) of the Act, 21 U.S.C.section 360bbb-3(b)(1), unless the authorization is terminated  or revoked sooner.       Influenza A by PCR NEGATIVE NEGATIVE Final   Influenza B by PCR NEGATIVE NEGATIVE Final    Comment: (NOTE) The Xpert Xpress SARS-CoV-2/FLU/RSV plus assay is intended as an aid in the diagnosis of influenza from Nasopharyngeal swab specimens and should not be used as a sole basis for treatment. Nasal washings and aspirates are unacceptable for Xpert Xpress SARS-CoV-2/FLU/RSV testing.  Fact Sheet for Patients: BloggerCourse.com  Fact Sheet for Healthcare Providers: SeriousBroker.it  This test is not yet approved or cleared by the [place]  FDA and has been authorized for detection and/or diagnosis of SARS-CoV-2 by FDA under an Emergency Use Authorization (EUA). This EUA will remain in effect (meaning this test can be used) for the duration of the COVID-19 declaration under Section 564(b)(1) of the Act, 21 U.S.C. section 360bbb-3(b)(1), unless the authorization is terminated or revoked.     Resp Syncytial  Virus by PCR NEGATIVE NEGATIVE Final    Comment: (NOTE) Fact Sheet for Patients: BloggerCourse.com  Fact Sheet for Healthcare Providers: SeriousBroker.it  This test is not yet approved or cleared by the [place]  FDA and has been authorized for detection and/or diagnosis of SARS-CoV-2 by FDA under an Emergency Use Authorization (EUA). This EUA will remain in effect (meaning this test can be used) for the  duration of the COVID-19 declaration under Section 564(b)(1) of the Act, 21 U.S.C. section 360bbb-3(b)(1), unless the authorization is terminated or revoked.  Performed at [facility], [address] [place], [place] [address]   Expectorated Sputum Assessment w Gram Stain, Rflx to Resp Cult     Status: None   Collection Time: 10/13/23  4:13 AM   Specimen: Expectorated Sputum  Result Value Ref Range Status   Specimen Description EXPECTORATED SPUTUM  Final   Special Requests NONE  Final   Sputum evaluation   Final    THIS SPECIMEN IS ACCEPTABLE FOR SPUTUM CULTURE Performed at [facility], [address] [place], [place] [address]    Report Status 10/13/2023 FINAL  Final  Culture, Respiratory w Gram Stain     Status: None   Collection Time: 10/13/23  4:13 AM  Result Value Ref Range Status   Specimen Description   Final    EXPECTORATED SPUTUM Performed at [facility], [address], [place], [place] [address]    Special Requests   Final    NONE Reflexed from [phone] Performed at [facility], [address], [place], [place] [address]    Gram Stain   Final    ABUNDANT WBC PRESENT, PREDOMINANTLY PMN NO ORGANISMS SEEN    Culture   Final    RARE Normal respiratory flora-no Staph aureus or Pseudomonas seen Performed at [facility], [address], [place], [place] [address]    Report Status 10/15/2023 FINAL  Final  Culture, blood (Routine X 2) w Reflex to ID Panel     Status: Abnormal   Collection Time: 10/13/23   4:15 AM   Specimen: BLOOD  Result Value Ref Range Status   Specimen Description   Final    BLOOD RIGHT ANTECUBITAL Performed at [facility], [address] [place], [place] [address]    Special Requests   Final    BOTTLES DRAWN AEROBIC AND ANAEROBIC Blood Culture results may not be optimal due to an inadequate volume of blood received in culture bottles Performed at [facility], [address], [place], [place] [address]    Culture  Setup Time   Final    GRAM POSITIVE COCCI AEROBIC BOTTLE ONLY Gram Stain Report Called to,Read Back By and Verified With: J. BERES ON 10/14/2023 @14 :38 BY T.HAMER CRITICAL RESULT CALLED TO, READ BACK BY AND VERIFIED WITH: S WADE RN 10/15/2023 @ 0129 BY AB    Culture (A)  Final    STAPHYLOCOCCUS EPIDERMIDIS THE SIGNIFICANCE OF ISOLATING THIS ORGANISM FROM A SINGLE SET OF BLOOD CULTURES WHEN MULTIPLE SETS ARE DRAWN IS UNCERTAIN. PLEASE NOTIFY THE MICROBIOLOGY DEPARTMENT WITHIN ONE WEEK IF SPECIATION AND SENSITIVITIES ARE REQUIRED. Performed at [facility], [address], [place], [place] [address]    Report Status 10/16/2023 FINAL  Final  Blood Culture ID Panel (Reflexed)     Status: Abnormal   Collection Time: 10/13/23  4:15 AM  Result Value Ref Range Status   Enterococcus faecalis NOT DETECTED NOT DETECTED Final   Enterococcus Faecium NOT DETECTED NOT DETECTED Final   Listeria monocytogenes NOT DETECTED NOT DETECTED Final   Staphylococcus species DETECTED (A) NOT DETECTED Final    Comment: CRITICAL RESULT CALLED TO, READ BACK BY AND VERIFIED WITH: S WADE RN 10/15/2023 @ 0129 BY AB    Staphylococcus aureus (BCID) NOT DETECTED NOT DETECTED Final   Staphylococcus epidermidis DETECTED (A) NOT DETECTED Final    Comment: Methicillin (oxacillin) resistant coagulase negative staphylococcus. Possible  blood culture contaminant (unless isolated from more than one blood culture draw or clinical case suggests pathogenicity). No antibiotic treatment is indicated  for blood  culture contaminants. CRITICAL RESULT CALLED TO, READ BACK BY AND VERIFIED WITH: S WADE RN 10/15/2023 @ 0129 BY AB    Staphylococcus lugdunensis NOT DETECTED NOT DETECTED Final   Streptococcus species NOT DETECTED NOT DETECTED Final   Streptococcus agalactiae NOT DETECTED NOT DETECTED Final   Streptococcus pneumoniae NOT DETECTED NOT DETECTED Final   Streptococcus pyogenes NOT DETECTED NOT DETECTED Final   A.calcoaceticus-baumannii NOT DETECTED NOT DETECTED Final   Bacteroides fragilis NOT DETECTED NOT DETECTED Final   Enterobacterales NOT DETECTED NOT DETECTED Final   Enterobacter cloacae complex NOT DETECTED NOT DETECTED Final   Escherichia coli NOT DETECTED NOT DETECTED Final   Klebsiella aerogenes NOT DETECTED NOT DETECTED Final   Klebsiella oxytoca NOT DETECTED NOT DETECTED Final   Klebsiella pneumoniae NOT DETECTED NOT DETECTED Final   Proteus species NOT DETECTED NOT DETECTED Final   Salmonella species NOT DETECTED NOT DETECTED Final   Serratia marcescens NOT DETECTED NOT DETECTED Final   Haemophilus influenzae NOT DETECTED NOT DETECTED Final   Neisseria meningitidis NOT DETECTED NOT DETECTED Final   Pseudomonas aeruginosa NOT DETECTED NOT DETECTED Final   Stenotrophomonas maltophilia NOT DETECTED NOT DETECTED Final   Candida albicans NOT DETECTED NOT DETECTED Final   Candida auris NOT DETECTED NOT DETECTED Final   Candida glabrata NOT DETECTED NOT DETECTED Final   Candida krusei NOT DETECTED NOT DETECTED Final   Candida parapsilosis NOT DETECTED NOT DETECTED Final   Candida tropicalis NOT DETECTED NOT DETECTED Final   Cryptococcus neoformans/gattii NOT DETECTED NOT DETECTED Final   Methicillin resistance mecA/C DETECTED (A) NOT DETECTED Final    Comment: CRITICAL RESULT CALLED TO, READ BACK BY AND VERIFIED WITH: S WADE RN 10/15/2023 @ 0129 BY AB Performed at [facility], [address] [place], [place] [address]   Culture, blood (Routine X 2) w Reflex to ID  Panel     Status: None (Preliminary result)   Collection Time: 10/13/23  4:30 AM   Specimen: BLOOD  Result Value Ref Range Status   Specimen Description BLOOD BLOOD RIGHT HAND  Final   Special Requests   Final    BOTTLES DRAWN AEROBIC AND ANAEROBIC Blood Culture results may not be optimal due to an inadequate volume of blood received in culture bottles   Culture   Final    NO GROWTH 3 DAYS Performed at [facility], [address] [place], [place] [address]    Report Status PENDING  Incomplete  Culture, blood (Routine X 2) w Reflex to ID Panel     Status: None (Preliminary result)   Collection Time: 10/14/23  7:10 PM   Specimen: BLOOD  Result Value Ref Range Status   Specimen Description BLOOD BLOOD RIGHT HAND  Final   Special Requests BOTTLES DRAWN AEROBIC ONLY  Final   Culture   Final    NO GROWTH 2 DAYS Performed at [facility], [address], [place], [place] [address]    Report Status PENDING  Incomplete  Culture, blood (Routine X 2) w Reflex to ID Panel     Status: None (Preliminary result)   Collection Time: 10/14/23  7:20 PM   Specimen: BLOOD  Result Value Ref Range Status   Specimen Description BLOOD BLOOD LEFT ARM  Final   Special Requests BOTTLES DRAWN AEROBIC AND ANAEROBIC  Final   Culture  Final    NO GROWTH 2 DAYS Performed at [facility], [address] [place], [place] [address]    Report Status PENDING  Incomplete    Radiology Studies: No results found.  Scheduled Meds:  arformoterol   15 mcg Nebulization BID   And   umeclidinium bromide   1 puff Inhalation Daily   aspirin  EC  81 mg Oral Daily   dabigatran   150 mg Oral BID   dextromethorphan -guaiFENesin   1 tablet Oral BID   dofetilide   250 mcg Oral BID   doxycycline   100 mg Oral Q12H   ipratropium-albuterol   3 mL Nebulization Q6H   levothyroxine   25 mcg Oral Q0600   methylPREDNISolone  (SOLU-MEDROL ) injection  40 mg Intravenous Q12H   metoprolol  tartrate  25 mg Oral BID   pantoprazole   40 mg Oral  Daily   sodium chloride  flush  3 mL Intravenous Q12H   Continuous Infusions:  sodium chloride        LOS: 2 days   Colin Dawley M.D on 10/16/2023 at 5:01 PM  Go to [email or web address] - for contact info  [facility] - Office  [phone]  If 7PM-7AM, please contact night-coverage [email or web address] 10/16/2023, 5:01 PM

## 2023-10-16 NOTE — TOC Transition Note (Signed)
 Transition of Care [facility]) - Discharge Note   Patient Details  Name: Kathleen Wright MRN: [id number] Date of Birth: 04/27/1966  Transition of Care [facility]) CM/SW Contact:  Grandville Lax, LCSWA Phone Number: 10/16/2023, 10:39 AM  Clinical Narrative:    CSW updated that pt will need home O2 at D/C. CSW spoke with pt about this and she is agreeable to [employer] as it is the agency in network with her insurance. CSW spoke to Zach with [employer] to give referral, he will get it processed and deliver tank to room for pt. TOC signing off.   Final next level of care: Home/Self Care Barriers to Discharge: Barriers Resolved   Patient Goals and CMS Choice Patient states their goals for this hospitalization and ongoing recovery are:: return home CMS Medicare.gov Compare Post Acute Care list provided to:: Patient Choice offered to / list presented to : Patient      Discharge Placement                       Discharge Plan and Services Additional resources added to the After Visit Summary for                  DME Arranged: Oxygen  DME Agency: [employer] Date DME Agency Contacted: 10/16/23   Representative spoke with at DME Agency: Gladys Lamp            Social Drivers of Health (SDOH) Interventions SDOH Screenings   Food Insecurity: No Food Insecurity (10/13/2023)  Housing: Low Risk  (10/13/2023)  Transportation Needs: No Transportation Needs (10/13/2023)  Utilities: Not At Risk (10/13/2023)  Tobacco Use: Medium Risk (10/12/2023)     Readmission Risk Interventions     No data to display

## 2023-10-16 NOTE — Discharge Instructions (Signed)
 1)You need oxygen  at home at 2 L via nasal cannula continuously while awake and while asleep--- smoking or having open fires around oxygen  can cause fire, significant injury and death  2)Avoid ibuprofen/Advil/Aleve/Motrin/Goody Powders/Naproxen/BC powders/Meloxicam/Diclofenac/Indomethacin and other Nonsteroidal anti-inflammatory medications as these will make you more likely to bleed and can cause stomach ulcers, can also cause Kidney problems.   3)Watch for bleeding while on Blood Thinners--watch for blood in your stool which can make your stool black, maroon, mahogany or red---, blood in your urine which can make your urine pink or red, nosebleeds , also watch for possible bruising --You are taking Pradaxa /Dabigatran --- which is a blood thinner--- be careful to avoid injury or falls  4)Very Low-salt diet advised---Less than 2 gm of Sodium per day advised----ok to use Mrs DASH salt substitute instead of Salt 5)Weigh yourself daily, call if you gain more than 3 pounds in 1 day or more than 5 pounds in 1 week as your diuretic medications may need to be adjusted 6) follow-up with the cardiologist as outpatient with previously advised 7) EKG shows borderline QTc prolongation- follow-up with her cardiologist as outpatient for repeat EKG and to discuss possible adjustments to Tikosyn 

## 2023-10-16 NOTE — Progress Notes (Signed)
    SATURATION QUALIFICATIONS: (This note is used to comply with regulatory documentation for home oxygen)   Patient Saturations on Room Air at Rest = 88 %   Patient Saturations on Room Air while Ambulating = 86%   Patient Saturations on 2 Liters of oxygen while Ambulating = 93%      Patient needs continuous O2 at 2 L/min continuously via nasal cannula with humidifier, with gaseous portability and conserving device    Krishna Dancel, MD  

## 2023-10-16 NOTE — Discharge Summary (Incomplete)
 Kathleen Wright, is a 58 y.o. female  DOB October 03, 1965  MRN [id number].  Admission date:  10/12/2023  Admitting Physician  Colin Dawley, MD  Discharge Date:  10/16/2023   Primary MD  [name], MD  Recommendations for primary care physician for things to follow:  1)You need oxygen  at home at 2 L via nasal cannula continuously while awake and while asleep--- smoking or having open fires around oxygen  can cause fire, significant injury and death  2)Avoid ibuprofen/Advil/Aleve/Motrin/Goody Powders/Naproxen/BC powders/Meloxicam/Diclofenac/Indomethacin and other Nonsteroidal anti-inflammatory medications as these will make you more likely to bleed and can cause stomach ulcers, can also cause Kidney problems.   3)Watch for bleeding while on Blood Thinners--watch for blood in your stool which can make your stool black, maroon, mahogany or red---, blood in your urine which can make your urine pink or red, nosebleeds , also watch for possible bruising --You are taking Pradaxa /Dabigatran --- which is a blood thinner--- be careful to avoid injury or falls  4)Very Low-salt diet advised---Less than 2 gm of Sodium per day advised----ok to use Mrs DASH salt substitute instead of Salt 5)Weigh yourself daily, call if you gain more than 3 pounds in 1 day or more than 5 pounds in 1 week as your diuretic medications may need to be adjusted 6) follow-up with the cardiologist as outpatient with previously advised 7) EKG shows borderline QTc prolongation- follow-up with her cardiologist as outpatient for repeat EKG and to discuss possible adjustments to Tikosyn   Admission Diagnosis  Acute exacerbation of chronic obstructive pulmonary disease (COPD) (HCC) [J44.1] COPD exacerbation (HCC) [J44.1] COPD with acute exacerbation (HCC) [J44.1]   Discharge Diagnosis  Acute exacerbation of chronic obstructive pulmonary disease (COPD) (HCC)  [J44.1] COPD exacerbation (HCC) [J44.1] COPD with acute exacerbation (HCC) [J44.1]    Principal Problem:   Acute exacerbation of chronic obstructive pulmonary disease (COPD) (HCC) Active Problems:   History of atrial fibrillation   Acquired hypothyroidism   COPD with acute exacerbation (HCC)   Elevated brain natriuretic peptide (BNP) level      Past Medical History:  Diagnosis Date   Asthma    Cervical cancer (HCC)    a. treated with partial removal of cervix   COPD (chronic obstructive pulmonary disease) (HCC)    Hypertrophic cardiomyopathy (HCC)    a. s/p myomectomy at [facility] 08/2004   Paroxysmal atrial flutter (HCC)    a. post-op after myomectomy in 2006. s/p TEE/DCCV.   Sleep apnea    Transient atrial fibrillation (HCC) 2006   a. after myomectomy at [facility] in 08/2004 - was on amiodarone but developed elevated LFTs felt possibly secondary to amiodarone     Past Surgical History:  Procedure Laterality Date   CARDIOVERSION N/A 08/12/2015   Procedure: CARDIOVERSION;  Surgeon: Gerard Knight, MD;  Location[facility] ENDO [facility];  Service: Cardiovascular;  Laterality: N/A;   CARDIOVERSION N/A 08/24/2020   Procedure: CARDIOVERSION;  Surgeon: Hugh Madura, MD;  Location: [facility] ENDOSCOPY;  Service: Cardiovascular;  Laterality: N/A;   CARDIOVERSION N/A 04/13/2021  Procedure: CARDIOVERSION;  Surgeon: Gerard Knight, MD;  Location: [facility] ORS;  Service: Cardiovascular;  Laterality: N/A;   Septal myomectomy  06/13/2004   [facility]   TEE WITHOUT CARDIOVERSION N/A 03/29/2016   Procedure: TRANSESOPHAGEAL ECHOCARDIOGRAM (TEE);  Surgeon: Lenise Quince, MD;  Location: [facility] ENDOSCOPY;  Service: Cardiovascular;  Laterality: N/A;   TEE WITHOUT CARDIOVERSION N/A 06/23/2016   Procedure: TRANSESOPHAGEAL ECHOCARDIOGRAM (TEE);  Surgeon: Lake Pilgrim, MD;  Location: [facility] ENDOSCOPY;  Service: Cardiovascular;  Laterality: N/A;   TRANSESOPHAGEAL ECHOCARDIOGRAM  2005   TUBAL LIGATION         HPI  from the  history and physical done on the day of admission:   HPI: Kathleen Wright is a 58 y.o. female with medical history significant of COPD, asthma, hypertrophic cardiomyopathy, paroxysmal atrial fibrillation who presents emergency department due to 1 week onset of productive cough with yellow/green tinge sputum.  She states that she went to her PCP who prescribed prednisone  dose pack without any improvement and she endorsed having fever as well, so she decided to go to the ED for further evaluation and management.  She denies any sick contacts and she states that she has not smoked any cigarettes in 2.5 months.  She endorsed O2 sat of 83 to 88% at home, she does not use supplemental oxygen  at baseline.   ED Course:  In the emergency department, she was hemodynamically stable.  Workup in the ED showed normal CBC except for WBC of 14.0 and hemoglobin of 11.6, BMP was normal except for blood glucose of 106, BNP 514.  Influenza A, B, SARS, Nancyi 2, RSV was negative. Chest x-ray shows stable chest, no acute process. Patient was empirically treated with IV ceftriaxone  and azithromycin  due to presumed CAP.  Breathing treatment was provided with DuoNebs.  IV Solu-Medrol  and 5 mg x 1 was given, magnesium  was provided.  TRH was asked to admit.   Review of Systems: Review of systems as noted in the HPI. All other systems reviewed and are negative.   Hospital Course:   Brief Narrative:  58 y.o. female with medical history significant of COPD, asthma, hypertrophic cardiomyopathy, paroxysmal atrial fibrillation admitted on 10/12/2023 with acute COPD exacerbation     -Assessment and Plan: 1) acute COPD exacerbation--no definite pneumonia on chest x-ray and clinically -10/15/23--patient ambulated with staff had significant cough dyspnea and desaturation on room air while ambulating --Respiratory status overall improving though- -continue IV Solu-Medrol ,  bronchodilators, mucolytic's - c/n doxycycline , avoid  azithromycin  due to QT prolongation concerns   2)HFpEF----echo with grade 3 diastolic dysfunction, EF is 55 to 50%, no regional wall motion abnormalities,, mild pulmonary artery hypertension noted - History of hypertrophic cardiomyopathy --- Pete echo showed there is moderate LVH including septal hypertrophy related to RVH. Patient reported to be status post septal myomectomy  - Continue metoprolol , hold off on diuretics   3)PAFib--- continue Pradaxa  for stroke prophylaxis and metoprolol  for rate control - Hold Tikosyn  due prolonged QT concerns with risk for torsades de pointes -Potassium was 4.3, Mag was 2.3 and phosphorus was 3.3 -Repeat EKG shows improving QTc, okay to resume Tikosyn  on 10/16/2023--she has to follow-up with her cardiologist as outpatient to discuss possible adjustments to her Tikosyn  - 4)Hypothyroidism--- continue levothyroxine    5) acute hypoxic respiratory failure--due to #1 above -10/15/23--patient ambulated with staff had significant cough dyspnea and desaturation on room air while ambulating -Currently on 2 L of oxygen  via nasal cannula - Anticipate resolution with treatment of #1  above   6)Staph Epi bacteremia----Blood cultures from 10/13/2023 with gram-positive cocci in aerobic bottle only (1 of 4 bottles) --??  Contaminant -Repeat blood cultures from 10/14/2023 NGTD - No further antibiotics after Rocephin  dose of 10/15/2023   7) chronic pain syndrome--- continue PTA opioids     Disposition: The patient is from: Home              Anticipated d/c is to: Home      Discharge Condition: ***  Follow UP   Follow-up Information     [name], MD. Schedule an appointment as soon as possible for a visit.   Specialty: Internal Medicine Why: If symptoms worsen Contact information: [address] [place] [address] [phone]                 Diet and Activity recommendation:  As advised  Discharge Instructions    **** Discharge  Instructions     Call MD for:  difficulty breathing, headache or visual disturbances   Complete by: As directed    Call MD for:  persistant dizziness or light-headedness   Complete by: As directed    Call MD for:  temperature >100.4   Complete by: As directed    Diet - low sodium heart healthy   Complete by: As directed    Discharge instructions   Complete by: As directed    1)You need oxygen  at home at 2 L via nasal cannula continuously while awake and while asleep--- smoking or having open fires around oxygen  can cause fire, significant injury and death  2)Avoid ibuprofen/Advil/Aleve/Motrin/Goody Powders/Naproxen/BC powders/Meloxicam/Diclofenac/Indomethacin and other Nonsteroidal anti-inflammatory medications as these will make you more likely to bleed and can cause stomach ulcers, can also cause Kidney problems.   3)Watch for bleeding while on Blood Thinners--watch for blood in your stool which can make your stool black, maroon, mahogany or red---, blood in your urine which can make your urine pink or red, nosebleeds , also watch for possible bruising --You are taking Pradaxa /Dabigatran --- which is a blood thinner--- be careful to avoid injury or falls  4)Very Low-salt diet advised---Less than 2 gm of Sodium per day advised----ok to use Mrs DASH salt substitute instead of Salt 5)Weigh yourself daily, call if you gain more than 3 pounds in 1 day or more than 5 pounds in 1 week as your diuretic medications may need to be adjusted 6) follow-up with the cardiologist as outpatient with previously advised 7) EKG shows borderline QTc prolongation- follow-up with her cardiologist as outpatient for repeat EKG and to discuss possible adjustments to Tikosyn    For home use only DME Nebulizer machine   Complete by: As directed    Patient needs a nebulizer to treat with the following condition: COPD (chronic obstructive pulmonary disease) (HCC)   Length of Need: Lifetime   Additional equipment  included:  Administration kit Filter     Increase activity slowly   Complete by: As directed          Discharge Medications     Allergies as of 10/16/2023       Reactions   Amiodarone Other (See Comments)   Liver function        Medication List     STOP taking these medications    ibuprofen 800 MG tablet Commonly known as: ADVIL   methylPREDNISolone  4 MG Tbpk tablet Commonly known as: MEDROL  DOSEPAK       TAKE these medications    acetaminophen  325 MG tablet Commonly known  as: TYLENOL  Take 2 tablets (650 mg total) by mouth every 6 (six) hours as needed for mild pain (pain score 1-3) (or Fever >/= 101).   aspirin  EC 81 MG tablet Take 1 tablet (81 mg total) by mouth daily with breakfast. What changed: when to take this   dabigatran  150 MG Caps capsule Commonly known as: Pradaxa  Take 1 capsule (150 mg total) by mouth 2 (two) times daily.   docusate sodium  100 MG capsule Commonly known as: COLACE Take 100 mg by mouth daily as needed for mild constipation.   dofetilide  250 MCG capsule Commonly known as: TIKOSYN  TAKE (1) CAPSULE BY MOUTH TWICE DAILY.   doxycycline  100 MG tablet Commonly known as: VIBRA -TABS Take 1 tablet (100 mg total) by mouth 2 (two) times daily for 5 days.   hydroxypropyl methylcellulose / hypromellose 2.5 % ophthalmic solution Commonly known as: ISOPTO TEARS / GONIOVISC Place 1 drop into both eyes 2 (two) times daily as needed for dry eyes.   levothyroxine  25 MCG tablet Commonly known as: SYNTHROID  Take 25 mcg by mouth daily.   metoprolol  tartrate 25 MG tablet Commonly known as: LOPRESSOR  Take 1 tablet (25 mg total) by mouth 2 (two) times daily.   omeprazole 40 MG capsule Commonly known as: PRILOSEC Take 1 capsule (40 mg total) by mouth daily.   Oxycodone  HCl 10 MG Tabs Take 10 mg by mouth every 6 (six) hours.   potassium chloride  10 MEQ tablet Commonly known as: KLOR-CON  Take 1 tablet (10 mEq total) by mouth See admin  instructions. Take Only While taking Demadex /Torsemide  What changed:  when to take this additional instructions   predniSONE  20 MG tablet Commonly known as: DELTASONE  Take 2 tablets (40 mg total) by mouth daily with breakfast for 5 days.   ProAir  HFA 108 (90 Base) MCG/ACT inhaler Generic drug: albuterol  Inhale 2 puffs into the lungs every 4 (four) hours as needed for wheezing or shortness of breath. What changed: when to take this   albuterol  (2.5 MG/3ML) 0.083% nebulizer solution Commonly known as: PROVENTIL  Take 3 mLs (2.5 mg total) by nebulization every 6 (six) hours as needed for wheezing or shortness of breath. What changed:  Another medication with the same name was added. Make sure you understand how and when to take each. Another medication with the same name was changed. Make sure you understand how and when to take each.   albuterol  (2.5 MG/3ML) 0.083% nebulizer solution Commonly known as: PROVENTIL  Take 3 mLs (2.5 mg total) by nebulization every 4 (four) hours as needed for wheezing or shortness of breath. What changed: You were already taking a medication with the same name, and this prescription was added. Make sure you understand how and when to take each.   Stiolto Respimat  2.5-2.5 MCG/ACT Aers Generic drug: Tiotropium Bromide-Olodaterol Inhale 2 puffs into the lungs daily.   torsemide  20 MG tablet Commonly known as: DEMADEX  Take 2 tablets (40 mg total) by mouth daily as needed (fluid, swelling).   VITAMIN D3 PO Take 1 tablet by mouth daily.               Durable Medical Equipment  (From admission, onward)           Start     Ordered   10/16/23 1023  For home use only DME oxygen   Once       Comments: SATURATION QUALIFICATIONS: (This note is used to comply with regulatory documentation for home oxygen )   Patient Saturations on Room ONEOK  at Rest = 88 %   Patient Saturations on Room Air while Ambulating = 86 %   Patient Saturations on 2 Liters of  oxygen  while Ambulating = 93 %    Patient needs continuous O2 at 2 L/min continuously via nasal cannula with humidifier, with gaseous portability and conserving device  Question Answer Comment  Length of Need Lifetime   Mode or (Route) Nasal cannula   Liters per Minute 2   Frequency Continuous (stationary and portable oxygen  unit needed)   Oxygen  conserving device Yes   Oxygen  delivery system Gas      10/16/23 1023   10/16/23 0000  For home use only DME Nebulizer machine       Question Answer Comment  Patient needs a nebulizer to treat with the following condition COPD (chronic obstructive pulmonary disease) (HCC)   Length of Need Lifetime   Additional equipment included Administration kit   Additional equipment included Filter      10/16/23 1230            Major procedures and Radiology Reports - PLEASE review detailed and final reports for all details, in brief -   ***  DG Chest 2 View Result Date: 10/12/2023 CLINICAL DATA:  Short of breath, difficulty breathing EXAM: CHEST - 2 VIEW COMPARISON:  04/01/2021 FINDINGS: Frontal and lateral views of the chest demonstrate postsurgical changes from median sternotomy. The cardiac silhouette remains enlarged. No acute airspace disease, effusion, or pneumothorax. No acute bony abnormalities. IMPRESSION: 1. Stable chest, no acute process. Electronically Signed   By: Bobbye Burrow M.D.   On: 10/12/2023 21:23    Micro Results   *** Recent Results (from the past 240 hours)  Resp panel by RT-PCR (RSV, Flu A&B, Covid) Anterior Nasal Swab     Status: None   Collection Time: 10/12/23  9:39 PM   Specimen: Anterior Nasal Swab  Result Value Ref Range Status   SARS Coronavirus 2 by RT PCR NEGATIVE NEGATIVE Final    Comment: (NOTE) SARS-CoV-2 target nucleic acids are NOT DETECTED.  The SARS-CoV-2 RNA is generally detectable in upper respiratory specimens during the acute phase of infection. The lowest concentration of SARS-CoV-2 viral  copies this assay can detect is 138 copies/mL. A negative result does not preclude SARS-Cov-2 infection and should not be used as the sole basis for treatment or other patient management decisions. A negative result may occur with  improper specimen collection/handling, submission of specimen other than nasopharyngeal swab, presence of viral mutation(s) within the areas targeted by this assay, and inadequate number of viral copies(<138 copies/mL). A negative result must be combined with clinical observations, patient history, and epidemiological information. The expected result is Negative.  Fact Sheet for Patients:  BloggerCourse.com  Fact Sheet for Healthcare Providers:  SeriousBroker.it  This test is no t yet approved or cleared by the [place]  FDA and  has been authorized for detection and/or diagnosis of SARS-CoV-2 by FDA under an Emergency Use Authorization (EUA). This EUA will remain  in effect (meaning this test can be used) for the duration of the COVID-19 declaration under Section 564(b)(1) of the Act, 21 U.S.C.section 360bbb-3(b)(1), unless the authorization is terminated  or revoked sooner.       Influenza A by PCR NEGATIVE NEGATIVE Final   Influenza B by PCR NEGATIVE NEGATIVE Final    Comment: (NOTE) The Xpert Xpress SARS-CoV-2/FLU/RSV plus assay is intended as an aid in the diagnosis of influenza from Nasopharyngeal swab specimens and should not be  used as a sole basis for treatment. Nasal washings and aspirates are unacceptable for Xpert Xpress SARS-CoV-2/FLU/RSV testing.  Fact Sheet for Patients: BloggerCourse.com  Fact Sheet for Healthcare Providers: SeriousBroker.it  This test is not yet approved or cleared by the [place]  FDA and has been authorized for detection and/or diagnosis of SARS-CoV-2 by FDA under an Emergency Use Authorization (EUA). This  EUA will remain in effect (meaning this test can be used) for the duration of the COVID-19 declaration under Section 564(b)(1) of the Act, 21 U.S.C. section 360bbb-3(b)(1), unless the authorization is terminated or revoked.     Resp Syncytial Virus by PCR NEGATIVE NEGATIVE Final    Comment: (NOTE) Fact Sheet for Patients: BloggerCourse.com  Fact Sheet for Healthcare Providers: SeriousBroker.it  This test is not yet approved or cleared by the [place]  FDA and has been authorized for detection and/or diagnosis of SARS-CoV-2 by FDA under an Emergency Use Authorization (EUA). This EUA will remain in effect (meaning this test can be used) for the duration of the COVID-19 declaration under Section 564(b)(1) of the Act, 21 U.S.C. section 360bbb-3(b)(1), unless the authorization is terminated or revoked.  Performed at [facility], [address], [place], [place] [address]   Expectorated Sputum Assessment w Gram Stain, Rflx to Resp Cult     Status: None   Collection Time: 10/13/23  4:13 AM   Specimen: Expectorated Sputum  Result Value Ref Range Status   Specimen Description EXPECTORATED SPUTUM  Final   Special Requests NONE  Final   Sputum evaluation   Final    THIS SPECIMEN IS ACCEPTABLE FOR SPUTUM CULTURE Performed at [facility], [address], [place], [place] [address]    Report Status 10/13/2023 FINAL  Final  Culture, Respiratory w Gram Stain     Status: None   Collection Time: 10/13/23  4:13 AM  Result Value Ref Range Status   Specimen Description   Final    EXPECTORATED SPUTUM Performed at [facility], [address] [place], [place] [address]    Special Requests   Final    NONE Reflexed from [id number] Performed at [facility], [address] [place], [place] [address]    Gram Stain   Final    ABUNDANT WBC PRESENT, PREDOMINANTLY PMN NO ORGANISMS SEEN    Culture   Final    RARE Normal respiratory flora-no Staph  aureus or Pseudomonas seen Performed at [facility], [address] [place], [place] [address]    Report Status 10/15/2023 FINAL  Final  Culture, blood (Routine X 2) w Reflex to ID Panel     Status: Abnormal   Collection Time: 10/13/23  4:15 AM   Specimen: BLOOD  Result Value Ref Range Status   Specimen Description   Final    BLOOD RIGHT ANTECUBITAL Performed at [facility], [address] [place], [place] [address]    Special Requests   Final    BOTTLES DRAWN AEROBIC AND ANAEROBIC Blood Culture results may not be optimal due to an inadequate volume of blood received in culture bottles Performed at [facility], [address] [place], [place] [address]    Culture  Setup Time   Final    GRAM POSITIVE COCCI AEROBIC BOTTLE ONLY Gram Stain Report Called to,Read Back By and Verified With: J. BERES ON 10/14/2023 @14 :38 BY T.HAMER CRITICAL RESULT CALLED TO, READ BACK BY AND VERIFIED WITH: S WADE RN 10/15/2023 @ 0129 BY AB    Culture (A)  Final  STAPHYLOCOCCUS EPIDERMIDIS THE SIGNIFICANCE OF ISOLATING THIS ORGANISM FROM A SINGLE SET OF BLOOD CULTURES WHEN MULTIPLE SETS ARE DRAWN IS UNCERTAIN. PLEASE NOTIFY THE MICROBIOLOGY DEPARTMENT WITHIN ONE WEEK IF SPECIATION AND SENSITIVITIES ARE REQUIRED. Performed at [facility], [address], [place], [place] [address]    Report Status 10/16/2023 FINAL  Final  Blood Culture ID Panel (Reflexed)     Status: Abnormal   Collection Time: 10/13/23  4:15 AM  Result Value Ref Range Status   Enterococcus faecalis NOT DETECTED NOT DETECTED Final   Enterococcus Faecium NOT DETECTED NOT DETECTED Final   Listeria monocytogenes NOT DETECTED NOT DETECTED Final   Staphylococcus species DETECTED (A) NOT DETECTED Final    Comment: CRITICAL RESULT CALLED TO, READ BACK BY AND VERIFIED WITH: S WADE RN 10/15/2023 @ 0129 BY AB    Staphylococcus aureus (BCID) NOT DETECTED NOT DETECTED Final   Staphylococcus epidermidis DETECTED (A) NOT DETECTED Final     Comment: Methicillin (oxacillin) resistant coagulase negative staphylococcus. Possible blood culture contaminant (unless isolated from more than one blood culture draw or clinical case suggests pathogenicity). No antibiotic treatment is indicated for blood  culture contaminants. CRITICAL RESULT CALLED TO, READ BACK BY AND VERIFIED WITH: S WADE RN 10/15/2023 @ 0129 BY AB    Staphylococcus lugdunensis NOT DETECTED NOT DETECTED Final   Streptococcus species NOT DETECTED NOT DETECTED Final   Streptococcus agalactiae NOT DETECTED NOT DETECTED Final   Streptococcus pneumoniae NOT DETECTED NOT DETECTED Final   Streptococcus pyogenes NOT DETECTED NOT DETECTED Final   A.calcoaceticus-baumannii NOT DETECTED NOT DETECTED Final   Bacteroides fragilis NOT DETECTED NOT DETECTED Final   Enterobacterales NOT DETECTED NOT DETECTED Final   Enterobacter cloacae complex NOT DETECTED NOT DETECTED Final   Escherichia coli NOT DETECTED NOT DETECTED Final   Klebsiella aerogenes NOT DETECTED NOT DETECTED Final   Klebsiella oxytoca NOT DETECTED NOT DETECTED Final   Klebsiella pneumoniae NOT DETECTED NOT DETECTED Final   Proteus species NOT DETECTED NOT DETECTED Final   Salmonella species NOT DETECTED NOT DETECTED Final   Serratia marcescens NOT DETECTED NOT DETECTED Final   Haemophilus influenzae NOT DETECTED NOT DETECTED Final   Neisseria meningitidis NOT DETECTED NOT DETECTED Final   Pseudomonas aeruginosa NOT DETECTED NOT DETECTED Final   Stenotrophomonas maltophilia NOT DETECTED NOT DETECTED Final   Candida albicans NOT DETECTED NOT DETECTED Final   Candida auris NOT DETECTED NOT DETECTED Final   Candida glabrata NOT DETECTED NOT DETECTED Final   Candida krusei NOT DETECTED NOT DETECTED Final   Candida parapsilosis NOT DETECTED NOT DETECTED Final   Candida tropicalis NOT DETECTED NOT DETECTED Final   Cryptococcus neoformans/gattii NOT DETECTED NOT DETECTED Final   Methicillin resistance mecA/C DETECTED  (A) NOT DETECTED Final    Comment: CRITICAL RESULT CALLED TO, READ BACK BY AND VERIFIED WITH: S WADE RN 10/15/2023 @ 0129 BY AB Performed at [facility], [address] [place], [place] [address]   Culture, blood (Routine X 2) w Reflex to ID Panel     Status: None (Preliminary result)   Collection Time: 10/13/23  4:30 AM   Specimen: BLOOD  Result Value Ref Range Status   Specimen Description BLOOD BLOOD RIGHT HAND  Final   Special Requests   Final    BOTTLES DRAWN AEROBIC AND ANAEROBIC Blood Culture results may not be optimal due to an inadequate volume of blood received in culture bottles   Culture   Final    NO GROWTH  3 DAYS Performed at [facility], [address], [place], [place] [address]    Report Status PENDING  Incomplete  Culture, blood (Routine X 2) w Reflex to ID Panel     Status: None (Preliminary result)   Collection Time: 10/14/23  7:10 PM   Specimen: BLOOD  Result Value Ref Range Status   Specimen Description BLOOD BLOOD RIGHT HAND  Final   Special Requests BOTTLES DRAWN AEROBIC ONLY  Final   Culture   Final    NO GROWTH 2 DAYS Performed at [facility], [address] [place], [place] [address]    Report Status PENDING  Incomplete  Culture, blood (Routine X 2) w Reflex to ID Panel     Status: None (Preliminary result)   Collection Time: 10/14/23  7:20 PM   Specimen: BLOOD  Result Value Ref Range Status   Specimen Description BLOOD BLOOD LEFT ARM  Final   Special Requests BOTTLES DRAWN AEROBIC AND ANAEROBIC  Final   Culture   Final    NO GROWTH 2 DAYS Performed at [facility], [address] [place], [place] [address]    Report Status PENDING  Incomplete    Today   Subjective    Kathleen Wright today has no ***          Patient has been seen and examined prior to discharge   Objective   Blood pressure (!) 141/68, pulse 60, temperature 98 F (36.7 C), temperature source Oral, resp. rate 18, height 5\' 1"  (1.549 m), weight 94.8 kg, SpO2  99%.   Intake/Output Summary (Last 24 hours) at 10/16/2023 1231 Last data filed at 10/16/2023 0423 Gross per 24 hour  Intake 480 ml  Output 2050 ml  Net -1570 ml    Exam Gen:- Awake Alert, no acute distress *** HEENT:- [place].AT, No sclera icterus Neck-Supple Neck,No JVD,.  Lungs-  CTAB , good air movement bilaterally CV- S1, S2 normal, regular Abd-  +ve B.Sounds, Abd Soft, No tenderness,    Extremity/Skin:- No  edema,   good pulses Psych-affect is appropriate, oriented x3 Neuro-no new focal deficits, no tremors ***   Data Review   CBC w Diff:  Lab Results  Component Value Date   WBC 13.6 (H) 10/13/2023   HGB 11.0 (L) 10/13/2023   HGB 10.5 (L) 05/10/2021   HCT 35.6 (L) 10/13/2023   HCT 31.8 (L) 05/10/2021   PLT 311 10/13/2023   PLT 397 05/10/2021   LYMPHOPCT 21.2 04/01/2021   MONOPCT 13.8 (H) 04/01/2021   EOSPCT 2.5 04/01/2021   BASOPCT 0.7 04/01/2021    CMP:  Lab Results  Component Value Date   NA 136 10/13/2023   NA 138 11/22/2021   K 4.3 10/13/2023   CL 105 10/13/2023   CO2 24 10/13/2023   BUN 16 10/13/2023   BUN 17 11/22/2021   CREATININE 0.72 10/13/2023   CREATININE 1.17 (H) 03/22/2016   PROT 6.9 10/13/2023   ALBUMIN 3.2 (L) 10/13/2023   BILITOT 0.4 10/13/2023   ALKPHOS 112 10/13/2023   AST 17 10/13/2023   ALT 14 10/13/2023  .  Total Discharge time is about 33 minutes  Colin Dawley M.D on 10/16/2023 at 12:31 PM  Go to [email or web address] -  for contact info  [facility] - Office  [phone]

## 2023-10-17 DIAGNOSIS — I480 Paroxysmal atrial fibrillation: Secondary | ICD-10-CM

## 2023-10-17 DIAGNOSIS — Z5181 Encounter for therapeutic drug level monitoring: Secondary | ICD-10-CM

## 2023-10-17 DIAGNOSIS — Z79899 Other long term (current) drug therapy: Secondary | ICD-10-CM

## 2023-10-17 DIAGNOSIS — J441 Chronic obstructive pulmonary disease with (acute) exacerbation: Secondary | ICD-10-CM | POA: Diagnosis not present

## 2023-10-17 LAB — GLUCOSE, CAPILLARY
Glucose-Capillary: 131 mg/dL — ABNORMAL HIGH (ref 70–99)
Glucose-Capillary: 153 mg/dL — ABNORMAL HIGH (ref 70–99)
Glucose-Capillary: 161 mg/dL — ABNORMAL HIGH (ref 70–99)
Glucose-Capillary: 162 mg/dL — ABNORMAL HIGH (ref 70–99)

## 2023-10-17 LAB — BASIC METABOLIC PANEL WITH GFR
Anion gap: 9 (ref 5–15)
BUN: 28 mg/dL — ABNORMAL HIGH (ref 6–20)
CO2: 24 mmol/L (ref 22–32)
Calcium: 8.7 mg/dL — ABNORMAL LOW (ref 8.9–10.3)
Chloride: 102 mmol/L (ref 98–111)
Creatinine, Ser: 0.78 mg/dL (ref 0.44–1.00)
GFR, Estimated: >60 mL/min (ref >60)
Glucose, Bld: 166 mg/dL — ABNORMAL HIGH (ref 70–99)
Potassium: 4.5 mmol/L (ref 3.5–5.1)
Sodium: 135 mmol/L (ref 135–145)

## 2023-10-17 LAB — MAGNESIUM: Magnesium: 2.1 mg/dL (ref 1.7–2.4)

## 2023-10-17 MED ORDER — BISOPROLOL FUMARATE 5 MG PO TABS: 5.0000 mg | ORAL_TABLET | Freq: Every day | ORAL | 5 refills | Status: DC

## 2023-10-17 NOTE — Plan of Care (Signed)

## 2023-10-17 NOTE — Care Management Important Message (Signed)
 Important Message  Patient Details  Name: Kathleen Wright MRN: [id number] Date of Birth: 20-Apr-1966   Important Message Given:  N/A - LOS <3 / Initial given by admissions     Neila Bally 10/17/2023, 11:48 AM

## 2023-10-17 NOTE — Consult Note (Signed)
 CARDIOLOGY CONSULT NOTE    Patient ID: Kathleen Wright; [id number]; [birth year]   Admit date: 10/12/2023 Date of Consult: 10/17/2023  Primary Care Provider: [name], MD Primary Cardiologist:  Primary Electrophysiologist:     History of Present Illness:   Kathleen Wright is a 58 year old F known to have A-fib s/p PVI at [facility] in 2018 currently on Tikosyn , COPD, HOCM s/p myectomy, chronic diastolic heart failure, OSA presented to the ER with DOE, productive cough and is currently admitted to the hospitalist with management of COPD exacerbation.  Tikosyn  was held for 2 days during this hospitalization due to concern for QTc prolongation, QTc was 480 ms with repeat EKG showed normal QTc interval.  Tikosyn  was resumed yesterday morning.  She continues to have productive cough and desaturates with ambulation according to the patient.  Otherwise no chest pains or SOB or palpitations.  Past Medical History:  Diagnosis Date   Asthma    Cervical cancer (HCC)    a. treated with partial removal of cervix   COPD (chronic obstructive pulmonary disease) (HCC)    Hypertrophic cardiomyopathy (HCC)    a. s/p myomectomy at [facility] 08/2004   Paroxysmal atrial flutter (HCC)    a. post-op after myomectomy in 2006. s/p TEE/DCCV.   Sleep apnea    Transient atrial fibrillation (HCC) 2006   a. after myomectomy at [facility] in 08/2004 - was on amiodarone but developed elevated LFTs felt possibly secondary to amiodarone     Past Surgical History:  Procedure Laterality Date   CARDIOVERSION N/A 08/12/2015   Procedure: CARDIOVERSION;  Surgeon: Gerard Knight, MD;  Location: [facility] ENDO SUITE;  Service: Cardiovascular;  Laterality: N/A;   CARDIOVERSION N/A 08/24/2020   Procedure: CARDIOVERSION;  Surgeon: Hugh Madura, MD;  Location: [facility] ENDOSCOPY;  Service: Cardiovascular;  Laterality: N/A;   CARDIOVERSION N/A 04/13/2021   Procedure: CARDIOVERSION;  Surgeon: Gerard Knight, MD;  Location: [facility] ORS;  Service:  Cardiovascular;  Laterality: N/A;   Septal myomectomy  06/13/2004   [facility]   TEE WITHOUT CARDIOVERSION N/A 03/29/2016   Procedure: TRANSESOPHAGEAL ECHOCARDIOGRAM (TEE);  Surgeon: Lenise Quince, MD;  Location: [facility] ENDOSCOPY;  Service: Cardiovascular;  Laterality: N/A;   TEE WITHOUT CARDIOVERSION N/A 06/23/2016   Procedure: TRANSESOPHAGEAL ECHOCARDIOGRAM (TEE);  Surgeon: Lake Pilgrim, MD;  Location: [facility] ENDOSCOPY;  Service: Cardiovascular;  Laterality: N/A;   TRANSESOPHAGEAL ECHOCARDIOGRAM  2005   TUBAL LIGATION         Inpatient Medications: Scheduled Meds:  arformoterol   15 mcg Nebulization BID   And   umeclidinium bromide   1 puff Inhalation Daily   aspirin  EC  81 mg Oral Daily   dabigatran   150 mg Oral BID   dextromethorphan -guaiFENesin   1 tablet Oral BID   dofetilide   250 mcg Oral BID   doxycycline   100 mg Oral Q12H   ipratropium-albuterol   3 mL Nebulization Q6H   levothyroxine   25 mcg Oral Q0600   methylPREDNISolone  (SOLU-MEDROL ) injection  40 mg Intravenous Q12H   metoprolol  tartrate  25 mg Oral BID   pantoprazole   40 mg Oral Daily   sodium chloride  flush  3 mL Intravenous Q12H   Continuous Infusions:  sodium chloride      PRN Meds: sodium chloride , acetaminophen  **OR** acetaminophen , guaiFENesin -dextromethorphan , ipratropium-albuterol , ondansetron  **OR** ondansetron  (ZOFRAN ) IV, oxyCODONE , polyvinyl alcohol, sodium chloride  flush  Allergies:    Allergies  Allergen Reactions   Amiodarone Other (See Comments)    Liver function    Social History:   Social  History   Socioeconomic History   Marital status: Widowed    Spouse name: Not on file   Number of children: Not on file   Years of education: Not on file   Highest education level: Not on file  Occupational History   Occupation: Housekeeper  Tobacco Use   Smoking status: Former    Current packs/day: 0.00    Types: Cigarettes    Quit date: 12/11/2020    Years since quitting: 2.8   Smokeless tobacco: Never    Tobacco comments:    Former smoker 09/20/22  Vaping Use   Vaping status: Never Used  Substance and Sexual Activity   Alcohol use: No    Alcohol/week: 0.0 standard drinks of alcohol   Drug use: No   Sexual activity: Not on file  Other Topics Concern   Not on file  Social History Narrative   Not on file   Social Drivers of Health   Financial Resource Strain: Not on file  Food Insecurity: No Food Insecurity (10/13/2023)   Hunger Vital Sign    Worried About Running Out of Food in the Last Year: Never true    Ran Out of Food in the Last Year: Never true  Transportation Needs: No Transportation Needs (10/13/2023)   PRAPARE - Administrator, Civil Service (Medical): No    Lack of Transportation (Non-Medical): No  Physical Activity: Not on file  Stress: Not on file  Social Connections: Not on file  Intimate Partner Violence: Not At Risk (10/13/2023)   Humiliation, Afraid, Rape, and Kick questionnaire    Fear of Current or Ex-Partner: No    Emotionally Abused: No    Physically Abused: No    Sexually Abused: No    Family History:    Family History  Problem Relation Age of Onset   Heart failure Father    Diabetes Other    Lung disease Other      ROS:  Please see the history of present illness.  ROS  All other ROS reviewed and negative.     Physical Exam/Data:   Vitals:   10/16/23 1950 10/16/23 2034 10/17/23 0317 10/17/23 0700  BP: 103/73  123/68   Pulse: (!) 58  (!) 56   Resp:      Temp: 98.3 F (36.8 C)  98.2 F (36.8 C)   TempSrc: Oral  Oral   SpO2: 94% 95% 96% 97%  Weight:   93.2 kg   Height:       No intake or output data in the 24 hours ending 10/17/23 0909 Filed Weights   10/15/23 0513 10/16/23 0418 10/17/23 0317  Weight: 94.5 kg 94.8 kg 93.2 kg   Body mass index is 38.82 kg/m.  General:  Well nourished, well developed, in no acute distress HEENT: normal Lymph: no adenopathy Neck: no JVD Endocrine:  No thryomegaly Vascular: No carotid  bruits; FA pulses 2+ bilaterally without bruits  Cardiac:  normal S1, S2; RRR; no murmur  Lungs: Bilateral expiratory wheezing Abd: soft, nontender, no hepatomegaly  Ext: no edema Musculoskeletal:  No deformities, BUE and BLE strength normal and equal Skin: warm and dry  Neuro:  CNs 2-12 intact, no focal abnormalities noted Psych:  Normal affect   EKG:  The EKG was personally reviewed and demonstrates:   Telemetry:  Telemetry was personally reviewed and demonstrates:    Relevant CV Studies:   Laboratory Data:  Chemistry Recent Labs  Lab 10/12/23 2131 10/13/23 0342 10/17/23 0550  NA 137  136 135  K 3.7 4.3 4.5  CL 104 105 102  CO2 26 24 24   GLUCOSE 106* 199* 166*  BUN 20 16 28*  CREATININE 0.83 0.72 0.78  CALCIUM 8.5* 8.3* 8.7*  GFRNONAA >60 >60 >60  ANIONGAP 7 7 9     Recent Labs  Lab 10/13/23 0342  PROT 6.9  ALBUMIN 3.2*  AST 17  ALT 14  ALKPHOS 112  BILITOT 0.4   Hematology Recent Labs  Lab 10/12/23 2131 10/13/23 0342  WBC 14.0* 13.6*  RBC 4.07 3.99  HGB 11.6* 11.0*  HCT 36.6 35.6*  MCV 89.9 89.2  MCH 28.5 27.6  MCHC 31.7 30.9  RDW 15.1 14.7  PLT 358 311   Cardiac EnzymesNo results for input(s): "TROPONINI" in the last 168 hours. No results for input(s): "TROPIPOC" in the last 168 hours.  BNP Recent Labs  Lab 10/12/23 2131  BNP 514.0*    DDimer No results for input(s): "DDIMER" in the last 168 hours.  Radiology/Studies:  No results found.  Assessment and Plan:   Paroxysmal A-fib s/p PVI in 2018 at [facility]: Tikosyn  held for 2 days during this hospitalization due to concern for QTc interval. Cardiology consulted if okay to resume Tikosyn  in the outpatient setting. Recommended inpatient initiation Tikosyn .  First dose was yesterday morning, normal QTc interval on EKG obtained 2 hours after Tikosyn  dose.  Will monitor serial QTc intervals and she should be stable to go home tomorrow morning from cardiology standpoint if her QTc interval remains less  than 500 ms. Keep K>4 and <5, Mg>2 and <3.  Currently metoprolol  tartrate 25 mg twice daily, switched to bisoprolol 5 mg once daily due to COPD, continue Pradaxa  150 mg twice daily. Unclear why she is on aspirin  while on Pradaxa .  Has been in NSR during this hospitalization.  HOCM s/p myectomy: Monitor.  COPD exacerbation: Bilateral wheezing and oxygen  desaturation on ambulation.  Management per primary team.   For questions or updates, please contact [facility] Please consult [email or web address] for contact info under Cardiology/STEMI.   Signed, Sheryl Saintil Priya Christie Copley, MD 10/17/2023 9:09 AM

## 2023-10-17 NOTE — Progress Notes (Addendum)
 PROGRESS NOTE   Kathleen Wright, is a 58 y.o. female, DOB - 1966/03/22, ONG:[id number]  Admit date - 10/12/2023   Admitting Physician Adisyn Ruscitti Quintella Buck, MD  Outpatient Primary MD for the patient is [name], MD  LOS - 3  Chief Complaint  Patient presents with   Shortness of Breath      Brief Narrative:  58 y.o. female with medical history significant of COPD, asthma, hypertrophic cardiomyopathy, paroxysmal atrial fibrillation admitted on 10/12/2023 with acute COPD exacerbation -Per Tikosyn  reinitiation protocol--patient can go home on 10/18/2023 if EKG obtained about 2 hours after am Tikosyn  dose shows QTc less than 500   -Assessment and Plan: 1)Acute COPD exacerbation--no definite pneumonia on chest x-ray and clinically -10/17/23--patient ambulated with staff had significant cough dyspnea and desaturation on room air while ambulating --Respiratory status overall improving though- -continue IV Solu-Medrol ,  bronchodilators, mucolytic's - c/n doxycycline , avoid azithromycin  due to QT prolongation concerns  2)HFpEF----echo with grade 3 diastolic dysfunction, EF is 55 to 50%, no regional wall motion abnormalities,, mild pulmonary artery hypertension noted - History of hypertrophic cardiomyopathy --- Pete echo showed there is moderate LVH including septal hypertrophy related to RVH. Patient reported to be status post septal myomectomy  - hold off on diuretics - Cardiology consult from Dr. Arthea Larsson appreciated - Stop metoprolol  use ,bisoprolol instead due to pulmonary (bronchospasms) concerns  3)PAFib--- continue Pradaxa  for stroke prophylaxis   - Initially we Held Tikosyn  due prolonged QT concerns  -Potassium was 4.3, Mag was 2.3 and phosphorus was 3.3 -Repeat EKG showed improving QTc,  -- Discussed with Dr. Mallipeddi  who spoke with Dr. Carolynne Citron from EP  -We restarted on Tikosyn   -check EKG couple of hours after each dose of Tikosyn  while restarting Tikosyn  -Tikosyn  reinitiation order  set used Keep potassium around >4 and magnesium  around >2  -she has to follow-up with her cardiologist as outpatient to discuss possible adjustments to her Tikosyn   -Per Tikosyn  reinitiation protocol--patient can go home on 10/18/2023 if EKG obtained about 2 hours after am Tikosyn  dose shows QTc less than 500 -- Stop metoprolol  use ,bisoprolol instead due to pulmonary (bronchospasms) concerns - 4)Hypothyroidism--- continue levothyroxine   5) acute hypoxic respiratory failure--due to #1 above -10/17/23--  -Currently on 2 L of oxygen  via nasal cannula - Anticipate discharge on home O2--- home O2 already provided  6)Staph Epi bacteremia----Blood cultures from 10/13/2023 with gram-positive cocci in aerobic bottle only (1 of 4 bottles) --??  Contaminant -Repeat blood cultures from 10/14/2023 NGTD - No further antibiotics after Rocephin  dose of 10/15/2023  7) chronic pain syndrome--- continue PTA opioids  Status is: Inpatient   Disposition: The patient is from: Home              Anticipated d/c is to: Home              Anticipated d/c date is: 1 day              Patient currently is not medically stable to d/c. Barriers: Not Clinically Stable-   Code Status :  -  Code Status: Full Code   Family Communication:    NA (patient is alert, awake and coherent)   DVT Prophylaxis  :   - SCDs  SCDs Start: 10/13/23 0316 dabigatran  (PRADAXA ) capsule 150 mg   Lab Results  Component Value Date   PLT 311 10/13/2023   Inpatient Medications  Scheduled Meds:  arformoterol   15 mcg Nebulization BID   And   umeclidinium bromide   1  puff Inhalation Daily   aspirin  EC  81 mg Oral Daily   dabigatran   150 mg Oral BID   dextromethorphan -guaiFENesin   1 tablet Oral BID   dofetilide   250 mcg Oral BID   doxycycline   100 mg Oral Q12H   ipratropium-albuterol   3 mL Nebulization Q6H   levothyroxine   25 mcg Oral Q0600   methylPREDNISolone  (SOLU-MEDROL ) injection  40 mg Intravenous Q12H   metoprolol  tartrate  25 mg  Oral BID   pantoprazole   40 mg Oral Daily   sodium chloride  flush  3 mL Intravenous Q12H   Continuous Infusions:   PRN Meds:.acetaminophen  **OR** acetaminophen , guaiFENesin -dextromethorphan , ipratropium-albuterol , ondansetron  **OR** ondansetron  (ZOFRAN ) IV, oxyCODONE , polyvinyl alcohol, sodium chloride  flush   Anti-infectives (From admission, onward)    Start     Dose/Rate Route Frequency Ordered Stop   10/16/23 0000  doxycycline  (VIBRA -TABS) 100 MG tablet        100 mg Oral 2 times daily 10/16/23 1230 10/21/23 2359   10/14/23 1930  cefTRIAXone  (ROCEPHIN ) 2 g in sodium chloride  0.9 % 100 mL IVPB        2 g 200 mL/hr over 30 Minutes Intravenous Every 24 hours 10/14/23 1836 10/15/23 2306   10/13/23 2200  doxycycline  (VIBRA -TABS) tablet 100 mg        100 mg Oral Every 12 hours 10/13/23 1900     10/13/23 1000  doxycycline  (VIBRAMYCIN ) 100 mg in sodium chloride  0.9 % 250 mL IVPB  Status:  Discontinued        100 mg 125 mL/hr over 120 Minutes Intravenous Every 12 hours 10/13/23 0426 10/13/23 1900   10/12/23 2300  cefTRIAXone  (ROCEPHIN ) 1 g in sodium chloride  0.9 % 100 mL IVPB        1 g 200 mL/hr over 30 Minutes Intravenous  Once 10/12/23 2250 10/12/23 2350   10/12/23 2300  azithromycin  (ZITHROMAX ) 500 mg in sodium chloride  0.9 % 250 mL IVPB  Status:  Discontinued        500 mg 250 mL/hr over 60 Minutes Intravenous Every 24 hours 10/12/23 2250 10/13/23 0426        Subjective: Kathleen Wright today has no fevers, no emesis,  No chest pain,   - - -10/17/23--  -Cough and dyspnea improving - Hypoxia persist--- not worse --Per Tikosyn  reinitiation protocol--patient can go home on 10/18/2023 if EKG obtained about 2 hours after am Tikosyn  dose shows QTc less than 500  Objective: Vitals:   10/17/23 0317 10/17/23 0700 10/17/23 1237 10/17/23 1354  BP: 123/68  135/71   Pulse: (!) 56  (!) 56   Resp:      Temp: 98.2 F (36.8 C)  98.1 F (36.7 C)   TempSrc: Oral  Oral   SpO2: 96% 97% 96% 97%   Weight: 93.2 kg     Height:        Intake/Output Summary (Last 24 hours) at 10/17/2023 1802 Last data filed at 10/17/2023 0900 Gross per 24 hour  Intake 240 ml  Output --  Net 240 ml   Filed Weights   10/15/23 0513 10/16/23 0418 10/17/23 0317  Weight: 94.5 kg 94.8 kg 93.2 kg    Physical Exam  Gen:- Awake Alert, no acute distress HEENT:- Kathleen Wright.AT, No sclera icterus Nose-  2L/min (New) Neck-Supple Neck,No JVD,.  Lungs--improving air movement, no wheezes CV- S1, S2 normal, irregular  Abd-  +ve B.Sounds, Abd Soft, No tenderness,    Extremity/Skin:- No  edema, pedal pulses present  Psych-affect is appropriate, oriented x3 Neuro-no  new focal deficits, no tremors  Data Reviewed: I have personally reviewed following labs and imaging studies  CBC: Recent Labs  Lab 10/12/23 2131 10/13/23 0342  WBC 14.0* 13.6*  HGB 11.6* 11.0*  HCT 36.6 35.6*  MCV 89.9 89.2  PLT 358 311   Basic Metabolic Panel: Recent Labs  Lab 10/12/23 2131 10/13/23 0342 10/17/23 0550  NA 137 136 135  K 3.7 4.3 4.5  CL 104 105 102  CO2 26 24 24   GLUCOSE 106* 199* 166*  BUN 20 16 28*  CREATININE 0.83 0.72 0.78  CALCIUM 8.5* 8.3* 8.7*  MG  --  2.3 2.1  PHOS  --  3.3  --    GFR: Estimated Creatinine Clearance: 78.9 mL/min (by C-G formula based on SCr of 0.78 mg/dL). Liver Function Tests: Recent Labs  Lab 10/13/23 0342  AST 17  ALT 14  ALKPHOS 112  BILITOT 0.4  PROT 6.9  ALBUMIN 3.2*   Recent Results (from the past 240 hours)  Resp panel by RT-PCR (RSV, Flu A&B, Covid) Anterior Nasal Swab     Status: None   Collection Time: 10/12/23  9:39 PM   Specimen: Anterior Nasal Swab  Result Value Ref Range Status   SARS Coronavirus 2 by RT PCR NEGATIVE NEGATIVE Final    Comment: (NOTE) SARS-CoV-2 target nucleic acids are NOT DETECTED.  The SARS-CoV-2 RNA is generally detectable in upper respiratory specimens during the acute phase of infection. The lowest concentration of SARS-CoV-2 viral  copies this assay can detect is 138 copies/mL. A negative result does not preclude SARS-Cov-2 infection and should not be used as the sole basis for treatment or other patient management decisions. A negative result may occur with  improper specimen collection/handling, submission of specimen other than nasopharyngeal swab, presence of viral mutation(s) within the areas targeted by this assay, and inadequate number of viral copies(<138 copies/mL). A negative result must be combined with clinical observations, patient history, and epidemiological information. The expected result is Negative.  Fact Sheet for Patients:  BloggerCourse.com  Fact Sheet for Healthcare Providers:  SeriousBroker.it  This test is no t yet approved or cleared by the [place]  FDA and  has been authorized for detection and/or diagnosis of SARS-CoV-2 by FDA under an Emergency Use Authorization (EUA). This EUA will remain  in effect (meaning this test can be used) for the duration of the COVID-19 declaration under Section 564(b)(1) of the Act, 21 U.S.C.section 360bbb-3(b)(1), unless the authorization is terminated  or revoked sooner.       Influenza A by PCR NEGATIVE NEGATIVE Final   Influenza B by PCR NEGATIVE NEGATIVE Final    Comment: (NOTE) The Xpert Xpress SARS-CoV-2/FLU/RSV plus assay is intended as an aid in the diagnosis of influenza from Nasopharyngeal swab specimens and should not be used as a sole basis for treatment. Nasal washings and aspirates are unacceptable for Xpert Xpress SARS-CoV-2/FLU/RSV testing.  Fact Sheet for Patients: BloggerCourse.com  Fact Sheet for Healthcare Providers: SeriousBroker.it  This test is not yet approved or cleared by the [place]  FDA and has been authorized for detection and/or diagnosis of SARS-CoV-2 by FDA under an Emergency Use Authorization (EUA). This  EUA will remain in effect (meaning this test can be used) for the duration of the COVID-19 declaration under Section 564(b)(1) of the Act, 21 U.S.C. section 360bbb-3(b)(1), unless the authorization is terminated or revoked.     Resp Syncytial Virus by PCR NEGATIVE NEGATIVE Final    Comment: (NOTE) Fact Sheet for  Patients: BloggerCourse.com  Fact Sheet for Healthcare Providers: SeriousBroker.it  This test is not yet approved or cleared by the [place]  FDA and has been authorized for detection and/or diagnosis of SARS-CoV-2 by FDA under an Emergency Use Authorization (EUA). This EUA will remain in effect (meaning this test can be used) for the duration of the COVID-19 declaration under Section 564(b)(1) of the Act, 21 U.S.C. section 360bbb-3(b)(1), unless the authorization is terminated or revoked.  Performed at [facility], [address], [place], [place] [address]   Expectorated Sputum Assessment w Gram Stain, Rflx to Resp Cult     Status: None   Collection Time: 10/13/23  4:13 AM   Specimen: Expectorated Sputum  Result Value Ref Range Status   Specimen Description EXPECTORATED SPUTUM  Final   Special Requests NONE  Final   Sputum evaluation   Final    THIS SPECIMEN IS ACCEPTABLE FOR SPUTUM CULTURE Performed at [facility], [address] [place], [place] [address]    Report Status 10/13/2023 FINAL  Final  Culture, Respiratory w Gram Stain     Status: None   Collection Time: 10/13/23  4:13 AM  Result Value Ref Range Status   Specimen Description   Final    EXPECTORATED SPUTUM Performed at [facility], [address], [place], [place] [address]    Special Requests   Final    NONE Reflexed from [id number] Performed at [facility], [address], [place], [place] [address]    Gram Stain   Final    ABUNDANT WBC PRESENT, PREDOMINANTLY PMN NO ORGANISMS SEEN    Culture   Final    RARE Normal respiratory flora-no Staph  aureus or Pseudomonas seen Performed at [facility], [address], [place], [place] [address]    Report Status 10/15/2023 FINAL  Final  Culture, blood (Routine X 2) w Reflex to ID Panel     Status: Abnormal   Collection Time: 10/13/23  4:15 AM   Specimen: BLOOD  Result Value Ref Range Status   Specimen Description   Final    BLOOD RIGHT ANTECUBITAL Performed at [facility], [address], [place], [place] [address]    Special Requests   Final    BOTTLES DRAWN AEROBIC AND ANAEROBIC Blood Culture results may not be optimal due to an inadequate volume of blood received in culture bottles Performed at [facility], [address], [place], [place] [address]    Culture  Setup Time   Final    GRAM POSITIVE COCCI AEROBIC BOTTLE ONLY Gram Stain Report Called to,Read Back By and Verified With: J. BERES ON 10/14/2023 @14 :38 BY T.HAMER CRITICAL RESULT CALLED TO, READ BACK BY AND VERIFIED WITH: S WADE RN 10/15/2023 @ 0129 BY AB    Culture (A)  Final    STAPHYLOCOCCUS EPIDERMIDIS THE SIGNIFICANCE OF ISOLATING THIS ORGANISM FROM A SINGLE SET OF BLOOD CULTURES WHEN MULTIPLE SETS ARE DRAWN IS UNCERTAIN. PLEASE NOTIFY THE MICROBIOLOGY DEPARTMENT WITHIN ONE WEEK IF SPECIATION AND SENSITIVITIES ARE REQUIRED. Performed at [facility], [address], [place], [place] [address]    Report Status 10/16/2023 FINAL  Final  Blood Culture ID Panel (Reflexed)     Status: Abnormal   Collection Time: 10/13/23  4:15 AM  Result Value Ref Range Status   Enterococcus faecalis NOT DETECTED NOT DETECTED Final   Enterococcus Faecium NOT DETECTED NOT DETECTED Final   Listeria monocytogenes NOT DETECTED NOT DETECTED Final   Staphylococcus species DETECTED (A) NOT  DETECTED Final    Comment: CRITICAL RESULT CALLED TO, READ BACK BY AND VERIFIED WITH: S WADE RN 10/15/2023 @ 0129 BY AB    Staphylococcus aureus (BCID) NOT DETECTED NOT DETECTED Final   Staphylococcus epidermidis DETECTED (A) NOT DETECTED Final     Comment: Methicillin (oxacillin) resistant coagulase negative staphylococcus. Possible blood culture contaminant (unless isolated from more than one blood culture draw or clinical case suggests pathogenicity). No antibiotic treatment is indicated for blood  culture contaminants. CRITICAL RESULT CALLED TO, READ BACK BY AND VERIFIED WITH: S WADE RN 10/15/2023 @ 0129 BY AB    Staphylococcus lugdunensis NOT DETECTED NOT DETECTED Final   Streptococcus species NOT DETECTED NOT DETECTED Final   Streptococcus agalactiae NOT DETECTED NOT DETECTED Final   Streptococcus pneumoniae NOT DETECTED NOT DETECTED Final   Streptococcus pyogenes NOT DETECTED NOT DETECTED Final   A.calcoaceticus-baumannii NOT DETECTED NOT DETECTED Final   Bacteroides fragilis NOT DETECTED NOT DETECTED Final   Enterobacterales NOT DETECTED NOT DETECTED Final   Enterobacter cloacae complex NOT DETECTED NOT DETECTED Final   Escherichia coli NOT DETECTED NOT DETECTED Final   Klebsiella aerogenes NOT DETECTED NOT DETECTED Final   Klebsiella oxytoca NOT DETECTED NOT DETECTED Final   Klebsiella pneumoniae NOT DETECTED NOT DETECTED Final   Proteus species NOT DETECTED NOT DETECTED Final   Salmonella species NOT DETECTED NOT DETECTED Final   Serratia marcescens NOT DETECTED NOT DETECTED Final   Haemophilus influenzae NOT DETECTED NOT DETECTED Final   Neisseria meningitidis NOT DETECTED NOT DETECTED Final   Pseudomonas aeruginosa NOT DETECTED NOT DETECTED Final   Stenotrophomonas maltophilia NOT DETECTED NOT DETECTED Final   Candida albicans NOT DETECTED NOT DETECTED Final   Candida auris NOT DETECTED NOT DETECTED Final   Candida glabrata NOT DETECTED NOT DETECTED Final   Candida krusei NOT DETECTED NOT DETECTED Final   Candida parapsilosis NOT DETECTED NOT DETECTED Final   Candida tropicalis NOT DETECTED NOT DETECTED Final   Cryptococcus neoformans/gattii NOT DETECTED NOT DETECTED Final   Methicillin resistance mecA/C DETECTED  (A) NOT DETECTED Final    Comment: CRITICAL RESULT CALLED TO, READ BACK BY AND VERIFIED WITH: S WADE RN 10/15/2023 @ 0129 BY AB Performed at [facility], [address], [place], [place] [address]   Culture, blood (Routine X 2) w Reflex to ID Panel     Status: None (Preliminary result)   Collection Time: 10/13/23  4:30 AM   Specimen: BLOOD  Result Value Ref Range Status   Specimen Description BLOOD BLOOD RIGHT HAND  Final   Special Requests   Final    BOTTLES DRAWN AEROBIC AND ANAEROBIC Blood Culture results may not be optimal due to an inadequate volume of blood received in culture bottles   Culture   Final    NO GROWTH 4 DAYS Performed at [facility], [address] [place], [place] [address]    Report Status PENDING  Incomplete  Culture, blood (Routine X 2) w Reflex to ID Panel     Status: None (Preliminary result)   Collection Time: 10/14/23  7:10 PM   Specimen: BLOOD  Result Value Ref Range Status   Specimen Description BLOOD BLOOD RIGHT HAND  Final   Special Requests BOTTLES DRAWN AEROBIC ONLY  Final   Culture   Final    NO GROWTH 3 DAYS Performed at [facility], [address] [place], [place] [address]    Report Status PENDING  Incomplete  Culture, blood (Routine X 2) w Reflex  to ID Panel     Status: None (Preliminary result)   Collection Time: 10/14/23  7:20 PM   Specimen: BLOOD  Result Value Ref Range Status   Specimen Description BLOOD BLOOD LEFT ARM  Final   Special Requests BOTTLES DRAWN AEROBIC AND ANAEROBIC  Final   Culture   Final    NO GROWTH 3 DAYS Performed at [facility], [address] [place], [place] [address]    Report Status PENDING  Incomplete    Radiology Studies: No results found.  Scheduled Meds:  arformoterol   15 mcg Nebulization BID   And   umeclidinium bromide   1 puff Inhalation Daily   aspirin  EC  81 mg Oral Daily   dabigatran   150 mg Oral BID   dextromethorphan -guaiFENesin   1 tablet Oral BID   dofetilide   250 mcg Oral BID    doxycycline   100 mg Oral Q12H   ipratropium-albuterol   3 mL Nebulization Q6H   levothyroxine   25 mcg Oral Q0600   methylPREDNISolone  (SOLU-MEDROL ) injection  40 mg Intravenous Q12H   metoprolol  tartrate  25 mg Oral BID   pantoprazole   40 mg Oral Daily   sodium chloride  flush  3 mL Intravenous Q12H   Continuous Infusions:     LOS: 3 days   Colin Dawley M.D on 10/17/2023 at 6:02 PM  Go to [email or web address] - for contact info  [facility] - Office  [phone]  If 7PM-7AM, please contact night-coverage [email or web address] 10/17/2023, 6:02 PM

## 2023-10-18 DIAGNOSIS — R9431 Abnormal electrocardiogram [ECG] [EKG]: Secondary | ICD-10-CM

## 2023-10-18 DIAGNOSIS — J441 Chronic obstructive pulmonary disease with (acute) exacerbation: Secondary | ICD-10-CM | POA: Diagnosis not present

## 2023-10-18 DIAGNOSIS — J189 Pneumonia, unspecified organism: Secondary | ICD-10-CM | POA: Diagnosis not present

## 2023-10-18 DIAGNOSIS — R7989 Other specified abnormal findings of blood chemistry: Secondary | ICD-10-CM | POA: Diagnosis not present

## 2023-10-18 LAB — GLUCOSE, CAPILLARY
Glucose-Capillary: 153 mg/dL — ABNORMAL HIGH (ref 70–99)
Glucose-Capillary: 143 mg/dL — ABNORMAL HIGH (ref 70–99)

## 2023-10-18 LAB — CULTURE, BLOOD (ROUTINE X 2): Culture: NO GROWTH

## 2023-10-18 LAB — BASIC METABOLIC PANEL WITH GFR
Anion gap: 8 (ref 5–15)
BUN: 30 mg/dL — ABNORMAL HIGH (ref 6–20)
CO2: 26 mmol/L (ref 22–32)
Calcium: 8.7 mg/dL — ABNORMAL LOW (ref 8.9–10.3)
Chloride: 100 mmol/L (ref 98–111)
Creatinine, Ser: 0.84 mg/dL (ref 0.44–1.00)
GFR, Estimated: >60 mL/min (ref >60)
Glucose, Bld: 149 mg/dL — ABNORMAL HIGH (ref 70–99)
Potassium: 4.3 mmol/L (ref 3.5–5.1)
Sodium: 134 mmol/L — ABNORMAL LOW (ref 135–145)

## 2023-10-18 LAB — MAGNESIUM: Magnesium: 1.9 mg/dL (ref 1.7–2.4)

## 2023-10-18 MED ORDER — NYSTATIN 100000 UNIT/ML MT SUSP: 5.0000 mL | Freq: Four times a day (QID) | OROMUCOSAL | 0 refills | Status: AC

## 2023-10-18 MED ORDER — OMEPRAZOLE 40 MG PO CPDR: 40.0000 mg | DELAYED_RELEASE_CAPSULE | Freq: Two times a day (BID) | ORAL | 3 refills | Status: AC

## 2023-10-18 MED ORDER — MAGNESIUM SULFATE 2 GM/50ML IV SOLN
2.0000 g | Freq: Once | INTRAVENOUS | Status: AC
  Administered 2023-10-18: 2 g via INTRAVENOUS
  Filled 2023-10-18: qty 50

## 2023-10-18 MED ORDER — PREDNISONE 20 MG PO TABS: ORAL_TABLET | ORAL | 0 refills | Status: DC

## 2023-10-18 MED ORDER — NYSTATIN 100000 UNIT/ML MT SUSP
5.0000 mL | Freq: Four times a day (QID) | OROMUCOSAL | Status: DC
  Administered 2023-10-18 (×2): 500000 [IU] via ORAL
  Filled 2023-10-18 (×2): qty 5

## 2023-10-18 NOTE — Progress Notes (Signed)
   Made aware by Dr. Mallipeddi that are no additional cardiology recommendations today except for obtaining a repeat 12-Lead EKG two hours after Tikosyn . Since she just received Tikosyn  around 1006, would plan for EKG around noon. Message sent to patient's nurse.   Given that her Mg is at 1.9, will order additional supplementation to keep ~ 2.0. K+ 4.3 today.    Signed, Dorma Gash, PA-C 10/18/2023, 10:16 AM Pager: [phone]

## 2023-10-18 NOTE — Progress Notes (Signed)
 SATURATION QUALIFICATIONS: (This note is used to comply with regulatory documentation for home oxygen )  Patient Saturations on Room Air at Rest = 93%  Patient Saturations on Room Air while Ambulating = 88%  Patient Saturations on 1 Liters of oxygen  while Ambulating = 95%  Please briefly explain why patient needs home oxygen : Patient oxygen  saturations decrease when walking on room air.

## 2023-10-18 NOTE — Plan of Care (Signed)
  Problem: Education: Goal: Knowledge of General Education information will improve Description: Including pain rating scale, medication(s)/side effects and non-pharmacologic comfort measures Outcome: Completed/Met   Problem: Health Behavior/Discharge Planning: Goal: Ability to manage health-related needs will improve Outcome: Completed/Met   Problem: Clinical Measurements: Goal: Ability to maintain clinical measurements within normal limits will improve Outcome: Completed/Met Goal: Will remain free from infection Outcome: Completed/Met Goal: Diagnostic test results will improve Outcome: Completed/Met Goal: Respiratory complications will improve Outcome: Completed/Met Goal: Cardiovascular complication will be avoided Outcome: Completed/Met   Problem: Activity: Goal: Risk for activity intolerance will decrease Outcome: Completed/Met   Problem: Nutrition: Goal: Adequate nutrition will be maintained Outcome: Completed/Met   Problem: Coping: Goal: Level of anxiety will decrease Outcome: Completed/Met   Problem: Elimination: Goal: Will not experience complications related to bowel motility Outcome: Completed/Met Goal: Will not experience complications related to urinary retention Outcome: Completed/Met   Problem: Pain Managment: Goal: General experience of comfort will improve and/or be controlled Outcome: Completed/Met   Problem: Safety: Goal: Ability to remain free from injury will improve Outcome: Completed/Met   Problem: Skin Integrity: Goal: Risk for impaired skin integrity will decrease Outcome: Completed/Met   Problem: Education: Goal: Knowledge of disease or condition will improve Outcome: Completed/Met Goal: Understanding of medication regimen will improve Outcome: Completed/Met Goal: Individualized Educational Video(s) Outcome: Completed/Met   Problem: Activity: Goal: Ability to tolerate increased activity will improve Outcome: Completed/Met    Problem: Cardiac: Goal: Ability to achieve and maintain adequate cardiopulmonary perfusion will improve Outcome: Completed/Met   Problem: Health Behavior/Discharge Planning: Goal: Ability to safely manage health-related needs after discharge will improve Outcome: Completed/Met

## 2023-10-18 NOTE — Plan of Care (Signed)

## 2023-10-18 NOTE — Discharge Summary (Signed)
 Physician Discharge Summary   Patient: Kathleen Wright MRN: [id number] DOB: 10/13/1965  Admit date:     10/12/2023  Discharge date: 10/18/23  Discharge Physician: Justina Oman   PCP: [name], MD   Recommendations at discharge:  Repeat basic metabolic panel to follow ultralights renal function Reassess blood pressure and adjust medication as needed Repeat magnesium  level and further replete if magnesium  is not above 2 - Make sure patient follow-up with cardiology service as instructed Repeat chest x-ray in 6 weeks to assure resolution of infiltrates. Outpatient follow-up with pulmonologist for repeat PFTs and further adjustment to maintenance therapy of her COPD recommended.  Discharge Diagnoses: Principal Problem:   Acute exacerbation of chronic obstructive pulmonary disease (COPD) (HCC) Active Problems:   History of atrial fibrillation   Acquired hypothyroidism   COPD with acute exacerbation (HCC)   Community acquired pneumonia   Elevated brain natriuretic peptide (BNP) level   Prolonged Q-T interval on ECG  Brief Hospital admission narrative: As per H&P written by Dr. Elyse Hand on 10/12/2023 Kathleen Wright is a 57 y.o. female with medical history significant of COPD, asthma, hypertrophic cardiomyopathy, paroxysmal atrial fibrillation who presents emergency department due to 1 week onset of productive cough with yellow/green tinge sputum.  She states that she went to her PCP who prescribed prednisone  dose pack without any improvement and she endorsed having fever as well, so she decided to go to the ED for further evaluation and management.  She denies any sick contacts and she states that she has not smoked any cigarettes in 2.5 months.  She endorsed O2 sat of 83 to 88% at home, she does not use supplemental oxygen  at baseline.   ED Course:  In the emergency department, she was hemodynamically stable.  Workup in the ED showed normal CBC except for WBC of 14.0 and hemoglobin of  11.6, BMP was normal except for blood glucose of 106, BNP 514.  Influenza A, B, SARS, Nancyi 2, RSV was negative. Chest x-ray shows stable chest, no acute process. Patient was empirically treated with IV ceftriaxone  and azithromycin  due to presumed CAP.  Breathing treatment was provided with DuoNebs.  IV Solu-Medrol  and 5 mg x 1 was given, magnesium  was provided.  TRH was asked to admit.  Assessment and Plan: 1)Acute COPD exacerbation--no definite pneumonia on chest x-ray and clinically - Respiratory status overall improved and patient speaking in full sentences and feeling ready to go home - Steroids tapering, oral antibiotics, mucolytic's and resurgent of bronchodilator management provided at discharge - Outpatient follow-up with pulmonologist recommended for PFTs and further adjustment to maintenance treatment.   2)HFpEF----echo with grade 3 diastolic dysfunction, EF is 55 to 50%, no regional wall motion abnormalities,, mild pulmonary artery hypertension noted - History of hypertrophic cardiomyopathy --- Pete echo showed there is moderate LVH including septal hypertrophy related to RVH. Patient reported to be status post septal myomectomy  - hold off on diuretics - Cardiology consult from Dr. Arthea Larsson appreciated - Continue the use of bisoprolol following recommendations by cardiology service and given underlying condition of bronchospasm/COPD - Low-sodium diet, daily weights and adequate hydration discussed with patient - Continue outpatient follow-up with cardiology service.   3)PAFib--- continue Pradaxa  for stroke prophylaxis   - Initially we Held Tikosyn  given prolonged QT concerns  -Potassium was 4.3, Mag was 2.3 and phosphorus was 3.3 -Repeat EKG showed improving QTc,  -- Discussed with Dr. Arthea Larsson  who spoke with Dr. Carolynne Citron from EP  - Per cardiology recommendations Tikosyn   were restarted. -Will recommend keeping potassium around >4 and magnesium  around >2  -she has to  follow-up with her cardiologist as outpatient to discuss possible adjustments to her Tikosyn  dosage and any further medication management.  -Per Tikosyn  reinitiation protocol--repeat EKG for follow after initiation of Tikosyn  and remained stable; patient discharged home with instruction to follow-up with cardiology as an outpatient. -Continue the use of bisoprolol. 4)Hypothyroidism--- continue levothyroxine    5) acute hypoxic respiratory failure--due to #1 above -10/17/23--  - At discharge requiring 1 L oxygen  supplementation only with activity - Patient expressed having oxygen  at home and planning to use PRN. - Good saturation appreciated at rest on room air.   6)Staph Epi bacteremia----Blood cultures from 10/13/2023 with gram-positive cocci in aerobic bottle only (1 of 4 bottles) --??  Contaminant -Repeat blood cultures from 10/14/2023 NGTD - No further antibiotics after Rocephin  dose of 10/15/2023   7) chronic pain syndrome- - continue PTA opioids  8) class II obesity -Body mass index is 38.92 kg/m.  -Calorie diet, portion control and increasing activity discussed with patient.  Consultants: Cardiology service Procedures performed: See below for x-ray reports. Disposition: Home Diet recommendation: Heart healthy/low-sodium diet.  DISCHARGE MEDICATION: Allergies as of 10/18/2023       Reactions   Amiodarone Other (See Comments)   Liver function        Medication List     STOP taking these medications    ibuprofen 800 MG tablet Commonly known as: ADVIL   methylPREDNISolone  4 MG Tbpk tablet Commonly known as: MEDROL  DOSEPAK   metoprolol  tartrate 25 MG tablet Commonly known as: LOPRESSOR        TAKE these medications    acetaminophen  325 MG tablet Commonly known as: TYLENOL  Take 2 tablets (650 mg total) by mouth every 6 (six) hours as needed for mild pain (pain score 1-3) (or Fever >/= 101).   aspirin  EC 81 MG tablet Take 1 tablet (81 mg total) by mouth daily with  breakfast. What changed: when to take this   bisoprolol 5 MG tablet Commonly known as: ZEBETA Take 1 tablet (5 mg total) by mouth daily. Take in Place of Metoprolol    dabigatran  150 MG Caps capsule Commonly known as: Pradaxa  Take 1 capsule (150 mg total) by mouth 2 (two) times daily.   docusate sodium  100 MG capsule Commonly known as: COLACE Take 100 mg by mouth daily as needed for mild constipation.   dofetilide  250 MCG capsule Commonly known as: TIKOSYN  TAKE (1) CAPSULE BY MOUTH TWICE DAILY.   doxycycline  100 MG tablet Commonly known as: VIBRA -TABS Take 1 tablet (100 mg total) by mouth 2 (two) times daily for 5 days.   hydroxypropyl methylcellulose / hypromellose 2.5 % ophthalmic solution Commonly known as: ISOPTO TEARS / GONIOVISC Place 1 drop into both eyes 2 (two) times daily as needed for dry eyes.   levothyroxine  25 MCG tablet Commonly known as: SYNTHROID  Take 25 mcg by mouth daily.   nystatin  100000 UNIT/ML suspension Commonly known as: MYCOSTATIN  Take 5 mLs (500,000 Units total) by mouth 4 (four) times daily for 5 days.   omeprazole 40 MG capsule Commonly known as: PRILOSEC Take 1 capsule (40 mg total) by mouth in the morning and at bedtime. What changed: when to take this   Oxycodone  HCl 10 MG Tabs Take 10 mg by mouth every 6 (six) hours.   potassium chloride  10 MEQ tablet Commonly known as: KLOR-CON  Take 1 tablet (10 mEq total) by mouth See admin instructions. Take Only  While taking Demadex /Torsemide  What changed:  when to take this additional instructions   predniSONE  20 MG tablet Commonly known as: DELTASONE  Take 3 tablets by mouth daily x 1 day; then 2 tablets by mouth daily x 2 days; then 1 tablet by mouth daily x 3 days; then half tablet by mouth daily x 3 days and stop prednisone .   ProAir  HFA 108 (90 Base) MCG/ACT inhaler Generic drug: albuterol  Inhale 2 puffs into the lungs every 4 (four) hours as needed for wheezing or shortness of  breath. What changed: when to take this   albuterol  (2.5 MG/3ML) 0.083% nebulizer solution Commonly known as: PROVENTIL  Take 3 mLs (2.5 mg total) by nebulization every 4 (four) hours as needed for wheezing or shortness of breath. What changed: when to take this   Stiolto Respimat  2.5-2.5 MCG/ACT Aers Generic drug: Tiotropium Bromide-Olodaterol Inhale 2 puffs into the lungs daily.   torsemide  20 MG tablet Commonly known as: DEMADEX  Take 2 tablets (40 mg total) by mouth daily as needed (fluid, swelling).   VITAMIN D3 PO Take 1 tablet by mouth daily.               Durable Medical Equipment  (From admission, onward)           Start     Ordered   10/16/23 1023  For home use only DME oxygen   Once       Comments: SATURATION QUALIFICATIONS: (This note is used to comply with regulatory documentation for home oxygen )   Patient Saturations on Room Air at Rest = 88 %   Patient Saturations on Room Air while Ambulating = 86 %   Patient Saturations on 2 Liters of oxygen  while Ambulating = 93 %    Patient needs continuous O2 at 2 L/min continuously via nasal cannula with humidifier, with gaseous portability and conserving device  Question Answer Comment  Length of Need Lifetime   Mode or (Route) Nasal cannula   Liters per Minute 2   Frequency Continuous (stationary and portable oxygen  unit needed)   Oxygen  conserving device Yes   Oxygen  delivery system Gas      10/16/23 1023   10/16/23 0000  For home use only DME Nebulizer machine       Question Answer Comment  Patient needs a nebulizer to treat with the following condition COPD (chronic obstructive pulmonary disease) (HCC)   Length of Need Lifetime   Additional equipment included Administration kit   Additional equipment included Filter      10/16/23 1230            Follow-up Information     [name], MD. Schedule an appointment as soon as possible for a visit in 10 day(s).   Specialty: Internal  Medicine Contact information: [address] [place] [address] [phone]                Discharge Exam: Cleavon Curls Weights   10/16/23 0418 10/17/23 0317 10/18/23 0536  Weight: 94.8 kg 93.2 kg 93.4 kg   General exam: Alert, awake, oriented x 3; in no acute distress, speaking in full sentences. Respiratory system: Improved air movement bilaterally; positive scattered rhonchi without significant expiratory wheezing or crackles. Cardiovascular system: Rate controlled, no rubs, no gallops, no JVD appreciated on exam. Gastrointestinal system: Abdomen is obese, nondistended, soft and nontender. No organomegaly or masses felt. Normal bowel sounds heard. Central nervous system: No focal neurological deficits. Extremities: No cyanosis or clubbing; trace edema appreciated bilaterally. Skin: No petechiae. Psychiatry:  Judgement and insight appear normal. Mood & affect appropriate.    Condition at discharge: Stable and improved.  The results of significant diagnostics from this hospitalization (including imaging, microbiology, ancillary and laboratory) are listed below for reference.   Imaging Studies: DG Chest 2 View Result Date: 10/12/2023 CLINICAL DATA:  Short of breath, difficulty breathing EXAM: CHEST - 2 VIEW COMPARISON:  04/01/2021 FINDINGS: Frontal and lateral views of the chest demonstrate postsurgical changes from median sternotomy. The cardiac silhouette remains enlarged. No acute airspace disease, effusion, or pneumothorax. No acute bony abnormalities. IMPRESSION: 1. Stable chest, no acute process. Electronically Signed   By: Bobbye Burrow M.D.   On: 10/12/2023 21:23    Microbiology: Results for orders placed or performed during the hospital encounter of 10/12/23  Resp panel by RT-PCR (RSV, Flu A&B, Covid) Anterior Nasal Swab     Status: None   Collection Time: 10/12/23  9:39 PM   Specimen: Anterior Nasal Swab  Result Value Ref Range Status   SARS Coronavirus 2 by RT  PCR NEGATIVE NEGATIVE Final    Comment: (NOTE) SARS-CoV-2 target nucleic acids are NOT DETECTED.  The SARS-CoV-2 RNA is generally detectable in upper respiratory specimens during the acute phase of infection. The lowest concentration of SARS-CoV-2 viral copies this assay can detect is 138 copies/mL. A negative result does not preclude SARS-Cov-2 infection and should not be used as the sole basis for treatment or other patient management decisions. A negative result may occur with  improper specimen collection/handling, submission of specimen other than nasopharyngeal swab, presence of viral mutation(s) within the areas targeted by this assay, and inadequate number of viral copies(<138 copies/mL). A negative result must be combined with clinical observations, patient history, and epidemiological information. The expected result is Negative.  Fact Sheet for Patients:  BloggerCourse.com  Fact Sheet for Healthcare Providers:  SeriousBroker.it  This test is no t yet approved or cleared by the [place]  FDA and  has been authorized for detection and/or diagnosis of SARS-CoV-2 by FDA under an Emergency Use Authorization (EUA). This EUA will remain  in effect (meaning this test can be used) for the duration of the COVID-19 declaration under Section 564(b)(1) of the Act, 21 U.S.C.section 360bbb-3(b)(1), unless the authorization is terminated  or revoked sooner.       Influenza A by PCR NEGATIVE NEGATIVE Final   Influenza B by PCR NEGATIVE NEGATIVE Final    Comment: (NOTE) The Xpert Xpress SARS-CoV-2/FLU/RSV plus assay is intended as an aid in the diagnosis of influenza from Nasopharyngeal swab specimens and should not be used as a sole basis for treatment. Nasal washings and aspirates are unacceptable for Xpert Xpress SARS-CoV-2/FLU/RSV testing.  Fact Sheet for Patients: BloggerCourse.com  Fact Sheet for  Healthcare Providers: SeriousBroker.it  This test is not yet approved or cleared by the [place]  FDA and has been authorized for detection and/or diagnosis of SARS-CoV-2 by FDA under an Emergency Use Authorization (EUA). This EUA will remain in effect (meaning this test can be used) for the duration of the COVID-19 declaration under Section 564(b)(1) of the Act, 21 U.S.C. section 360bbb-3(b)(1), unless the authorization is terminated or revoked.     Resp Syncytial Virus by PCR NEGATIVE NEGATIVE Final    Comment: (NOTE) Fact Sheet for Patients: BloggerCourse.com  Fact Sheet for Healthcare Providers: SeriousBroker.it  This test is not yet approved or cleared by the [place]  FDA and has been authorized for detection and/or diagnosis of SARS-CoV-2 by FDA under an Emergency Use  Authorization (EUA). This EUA will remain in effect (meaning this test can be used) for the duration of the COVID-19 declaration under Section 564(b)(1) of the Act, 21 U.S.C. section 360bbb-3(b)(1), unless the authorization is terminated or revoked.  Performed at [facility], [address] [place], [place] [address]   Expectorated Sputum Assessment w Gram Stain, Rflx to Resp Cult     Status: None   Collection Time: 10/13/23  4:13 AM   Specimen: Expectorated Sputum  Result Value Ref Range Status   Specimen Description EXPECTORATED SPUTUM  Final   Special Requests NONE  Final   Sputum evaluation   Final    THIS SPECIMEN IS ACCEPTABLE FOR SPUTUM CULTURE Performed at [facility], [address] [place], [place] [address]    Report Status 10/13/2023 FINAL  Final  Culture, Respiratory w Gram Stain     Status: None   Collection Time: 10/13/23  4:13 AM  Result Value Ref Range Status   Specimen Description   Final    EXPECTORATED SPUTUM Performed at [facility], [address] [place], [place] [address]    Special Requests    Final    NONE Reflexed from [id number] Performed at [facility], [address], [place], [place] [address]    Gram Stain   Final    ABUNDANT WBC PRESENT, PREDOMINANTLY PMN NO ORGANISMS SEEN    Culture   Final    RARE Normal respiratory flora-no Staph aureus or Pseudomonas seen Performed at [facility], [address] [place], [place] [address]    Report Status 10/15/2023 FINAL  Final  Culture, blood (Routine X 2) w Reflex to ID Panel     Status: Abnormal   Collection Time: 10/13/23  4:15 AM   Specimen: BLOOD  Result Value Ref Range Status   Specimen Description   Final    BLOOD RIGHT ANTECUBITAL Performed at [facility], [address] [place], [place] [address]    Special Requests   Final    BOTTLES DRAWN AEROBIC AND ANAEROBIC Blood Culture results may not be optimal due to an inadequate volume of blood received in culture bottles Performed at [facility], [address] [place], [place] [address]    Culture  Setup Time   Final    GRAM POSITIVE COCCI AEROBIC BOTTLE ONLY Gram Stain Report Called to,Read Back By and Verified With: J. BERES ON 10/14/2023 @14 :38 BY T.HAMER CRITICAL RESULT CALLED TO, READ BACK BY AND VERIFIED WITH: S WADE RN 10/15/2023 @ 0129 BY AB    Culture (A)  Final    STAPHYLOCOCCUS EPIDERMIDIS THE SIGNIFICANCE OF ISOLATING THIS ORGANISM FROM A SINGLE SET OF BLOOD CULTURES WHEN MULTIPLE SETS ARE DRAWN IS UNCERTAIN. PLEASE NOTIFY THE MICROBIOLOGY DEPARTMENT WITHIN ONE WEEK IF SPECIATION AND SENSITIVITIES ARE REQUIRED. Performed at [facility], [address] [place], [place] [address]    Report Status 10/16/2023 FINAL  Final  Blood Culture ID Panel (Reflexed)     Status: Abnormal   Collection Time: 10/13/23  4:15 AM  Result Value Ref Range Status   Enterococcus faecalis NOT DETECTED NOT DETECTED Final   Enterococcus Faecium NOT DETECTED NOT DETECTED Final   Listeria monocytogenes NOT DETECTED NOT DETECTED Final   Staphylococcus species  DETECTED (A) NOT DETECTED Final    Comment: CRITICAL RESULT CALLED TO, READ BACK BY AND VERIFIED WITH: S WADE RN 10/15/2023 @ 0129 BY AB    Staphylococcus aureus (BCID) NOT DETECTED NOT DETECTED Final   Staphylococcus  epidermidis DETECTED (A) NOT DETECTED Final    Comment: Methicillin (oxacillin) resistant coagulase negative staphylococcus. Possible blood culture contaminant (unless isolated from more than one blood culture draw or clinical case suggests pathogenicity). No antibiotic treatment is indicated for blood  culture contaminants. CRITICAL RESULT CALLED TO, READ BACK BY AND VERIFIED WITH: S WADE RN 10/15/2023 @ 0129 BY AB    Staphylococcus lugdunensis NOT DETECTED NOT DETECTED Final   Streptococcus species NOT DETECTED NOT DETECTED Final   Streptococcus agalactiae NOT DETECTED NOT DETECTED Final   Streptococcus pneumoniae NOT DETECTED NOT DETECTED Final   Streptococcus pyogenes NOT DETECTED NOT DETECTED Final   A.calcoaceticus-baumannii NOT DETECTED NOT DETECTED Final   Bacteroides fragilis NOT DETECTED NOT DETECTED Final   Enterobacterales NOT DETECTED NOT DETECTED Final   Enterobacter cloacae complex NOT DETECTED NOT DETECTED Final   Escherichia coli NOT DETECTED NOT DETECTED Final   Klebsiella aerogenes NOT DETECTED NOT DETECTED Final   Klebsiella oxytoca NOT DETECTED NOT DETECTED Final   Klebsiella pneumoniae NOT DETECTED NOT DETECTED Final   Proteus species NOT DETECTED NOT DETECTED Final   Salmonella species NOT DETECTED NOT DETECTED Final   Serratia marcescens NOT DETECTED NOT DETECTED Final   Haemophilus influenzae NOT DETECTED NOT DETECTED Final   Neisseria meningitidis NOT DETECTED NOT DETECTED Final   Pseudomonas aeruginosa NOT DETECTED NOT DETECTED Final   Stenotrophomonas maltophilia NOT DETECTED NOT DETECTED Final   Candida albicans NOT DETECTED NOT DETECTED Final   Candida auris NOT DETECTED NOT DETECTED Final   Candida glabrata NOT DETECTED NOT DETECTED Final    Candida krusei NOT DETECTED NOT DETECTED Final   Candida parapsilosis NOT DETECTED NOT DETECTED Final   Candida tropicalis NOT DETECTED NOT DETECTED Final   Cryptococcus neoformans/gattii NOT DETECTED NOT DETECTED Final   Methicillin resistance mecA/C DETECTED (A) NOT DETECTED Final    Comment: CRITICAL RESULT CALLED TO, READ BACK BY AND VERIFIED WITH: S WADE RN 10/15/2023 @ 0129 BY AB Performed at [facility], [address], [place], [place] [address]   Culture, blood (Routine X 2) w Reflex to ID Panel     Status: None   Collection Time: 10/13/23  4:30 AM   Specimen: BLOOD  Result Value Ref Range Status   Specimen Description BLOOD BLOOD RIGHT HAND  Final   Special Requests   Final    BOTTLES DRAWN AEROBIC AND ANAEROBIC Blood Culture results may not be optimal due to an inadequate volume of blood received in culture bottles   Culture   Final    NO GROWTH 5 DAYS Performed at [facility], [address] [place], [place] [address]    Report Status 10/18/2023 FINAL  Final  Culture, blood (Routine X 2) w Reflex to ID Panel     Status: None (Preliminary result)   Collection Time: 10/14/23  7:10 PM   Specimen: BLOOD  Result Value Ref Range Status   Specimen Description BLOOD BLOOD RIGHT HAND  Final   Special Requests BOTTLES DRAWN AEROBIC ONLY  Final   Culture   Final    NO GROWTH 4 DAYS Performed at [facility], [address] [place], [place] [address]    Report Status PENDING  Incomplete  Culture, blood (Routine X 2) w Reflex to ID Panel     Status: None (Preliminary result)   Collection Time: 10/14/23  7:20 PM   Specimen: BLOOD  Result Value Ref Range Status   Specimen Description BLOOD BLOOD LEFT ARM  Final  Special Requests BOTTLES DRAWN AEROBIC AND ANAEROBIC  Final   Culture   Final    NO GROWTH 4 DAYS Performed at [facility], [address], [place], [place] [address]    Report Status PENDING  Incomplete    Labs: CBC: Recent Labs  Lab 10/12/23 2131  10/13/23 0342  WBC 14.0* 13.6*  HGB 11.6* 11.0*  HCT 36.6 35.6*  MCV 89.9 89.2  PLT 358 311   Basic Metabolic Panel: Recent Labs  Lab 10/12/23 2131 10/13/23 0342 10/17/23 0550 10/18/23 0358  NA 137 136 135 134*  K 3.7 4.3 4.5 4.3  CL 104 105 102 100  CO2 26 24 24 26   GLUCOSE 106* 199* 166* 149*  BUN 20 16 28* 30*  CREATININE 0.83 0.72 0.78 0.84  CALCIUM 8.5* 8.3* 8.7* 8.7*  MG  --  2.3 2.1 1.9  PHOS  --  3.3  --   --    Liver Function Tests: Recent Labs  Lab 10/13/23 0342  AST 17  ALT 14  ALKPHOS 112  BILITOT 0.4  PROT 6.9  ALBUMIN 3.2*   CBG: Recent Labs  Lab 10/17/23 1114 10/17/23 1758 10/17/23 2355 10/18/23 0538 10/18/23 1140  GLUCAP 131* 162* 153* 153* 143*    Discharge time spent: greater than 30 minutes.  Signed: Justina Oman, MD [facility] 10/18/2023

## 2023-10-19 DIAGNOSIS — J441 Chronic obstructive pulmonary disease with (acute) exacerbation: Secondary | ICD-10-CM | POA: Diagnosis not present

## 2023-10-19 LAB — CULTURE, BLOOD (ROUTINE X 2)
Culture: NO GROWTH
Culture: NO GROWTH

## 2023-10-24 ENCOUNTER — Ambulatory Visit (HOSPITAL_COMMUNITY)
Admission: RE | Admit: 2023-10-24 | Discharge: 2023-10-24 | Disposition: A | Discharge status: Home / Self Care | Source: Ambulatory Visit | Attending: Physician Assistant | Admitting: Physician Assistant

## 2023-10-24 ENCOUNTER — Encounter (HOSPITAL_COMMUNITY): Payer: Self-pay | Admitting: Physician Assistant

## 2023-10-24 VITALS — BP 100/62 | HR 54 | Ht 61.0 in | Wt 208.0 lb

## 2023-10-24 DIAGNOSIS — I480 Paroxysmal atrial fibrillation: Secondary | ICD-10-CM

## 2023-10-24 DIAGNOSIS — Z5181 Encounter for therapeutic drug level monitoring: Secondary | ICD-10-CM | POA: Diagnosis not present

## 2023-10-24 DIAGNOSIS — Z79899 Other long term (current) drug therapy: Secondary | ICD-10-CM

## 2023-10-24 NOTE — Progress Notes (Signed)
 Primary Care Physician: [name], MD Referring Physician: Dr. Marven Slimmer  Primary Cardiologist: Dr Renna Cary Primary EP: Dr Gus Left is a 58 y.o. female with a h/o a h/o persistent afib, COPD, HOCM, s/p afib ablation at [facility] in July of  2018. Patient is s/p dofetilide  loading 12/27-12/30/22. She presented in SR and did not require DCCV. Her dose had to be down titrated due to a short episode of WCT.   Patient returns for follow up for atrial fibrillation and dofetilide  monitoring. Patient was hospitalized for a COPD exacerbation 10/12/23. During the admission, her dofetilide  was held for 2 days due to concern of QT prolongation. Cardiology was consulted and her dofetilide  was resumed. She was monitored an additional day and then discharged. She feels better today but did have a brief episode of afib last evening.   Today, she  denies symptoms of palpitations, chest pain, shortness of breath, orthopnea, PND, lower extremity edema, dizziness, presyncope, syncope, bleeding, or neurologic sequela. The patient is tolerating medications without difficulties and is otherwise without complaint today.    Past Medical History:  Diagnosis Date   Asthma    Cervical cancer (HCC)    a. treated with partial removal of cervix   COPD (chronic obstructive pulmonary disease) (HCC)    Hypertrophic cardiomyopathy (HCC)    a. s/p myomectomy at [facility] 08/2004   Paroxysmal atrial flutter (HCC)    a. post-op after myomectomy in 2006. s/p TEE/DCCV.   Sleep apnea    Transient atrial fibrillation (HCC) 2006   a. after myomectomy at [facility] in 08/2004 - was on amiodarone but developed elevated LFTs felt possibly secondary to amiodarone     Current Outpatient Medications  Medication Sig Dispense Refill   acetaminophen  (TYLENOL ) 325 MG tablet Take 2 tablets (650 mg total) by mouth every 6 (six) hours as needed for mild pain (pain score 1-3) (or Fever >/= 101).     albuterol  (PROVENTIL )  (2.5 MG/3ML) 0.083% nebulizer solution Take 3 mLs (2.5 mg total) by nebulization every 4 (four) hours as needed for wheezing or shortness of breath. 75 mL 2   aspirin  EC 81 MG tablet Take 1 tablet (81 mg total) by mouth daily with breakfast. 30 tablet 11   bisoprolol  (ZEBETA ) 5 MG tablet Take 1 tablet (5 mg total) by mouth daily. Take in Place of Metoprolol  30 tablet 5   Cholecalciferol (VITAMIN D3 PO) Take 1 tablet by mouth daily.     dabigatran  (PRADAXA ) 150 MG CAPS capsule Take 1 capsule (150 mg total) by mouth 2 (two) times daily. 60 capsule 6   docusate sodium  (COLACE) 100 MG capsule Take 100 mg by mouth daily as needed for mild constipation.     dofetilide  (TIKOSYN ) 250 MCG capsule TAKE (1) CAPSULE BY MOUTH TWICE DAILY. 60 capsule 6   hydroxypropyl methylcellulose / hypromellose (ISOPTO TEARS / GONIOVISC) 2.5 % ophthalmic solution Place 1 drop into both eyes 2 (two) times daily as needed for dry eyes.     levothyroxine  (SYNTHROID ) 25 MCG tablet Take 25 mcg by mouth daily.     omeprazole  (PRILOSEC) 40 MG capsule Take 1 capsule (40 mg total) by mouth in the morning and at bedtime. 60 capsule 3   Oxycodone  HCl 10 MG TABS Take 10 mg by mouth every 6 (six) hours.     potassium chloride  (KLOR-CON ) 10 MEQ tablet Take 1 tablet (10 mEq total) by mouth See admin instructions. Take Only While taking Demadex /Torsemide  30  tablet 2   predniSONE  (DELTASONE ) 20 MG tablet Take 3 tablets by mouth daily x 1 day; then 2 tablets by mouth daily x 2 days; then 1 tablet by mouth daily x 3 days; then half tablet by mouth daily x 3 days and stop prednisone . 12 tablet 0   PROAIR  HFA 108 (90 Base) MCG/ACT inhaler Inhale 2 puffs into the lungs every 4 (four) hours as needed for wheezing or shortness of breath. 8 g 4   Tiotropium Bromide-Olodaterol (STIOLTO RESPIMAT ) 2.5-2.5 MCG/ACT AERS Inhale 2 puffs into the lungs daily. 4 g 2   torsemide  (DEMADEX ) 20 MG tablet Take 2 tablets (40 mg total) by mouth daily as needed (fluid,  swelling). 30 tablet 1   No current facility-administered medications for this encounter.    ROS- All systems are reviewed and negative except as per the HPI above  Physical Exam: Vitals:   10/24/23 0927  BP: 100/62  Pulse: (!) 54  Weight: 94.3 kg  Height: 5\' 1"  (1.549 m)    Wt Readings from Last 3 Encounters:  10/24/23 94.3 kg  10/18/23 93.4 kg  09/20/23 96.4 kg    GEN: Well nourished, well developed in no acute distress CARDIAC: Regular rate and rhythm, no murmurs, rubs, gallops RESPIRATORY:  Clear to auscultation without rales, wheezing or rhonchi  ABDOMEN: Soft, non-tender, non-distended EXTREMITIES:  No edema; No deformity     EKG today demonstrates SB, 1st degree AV block, LVH Vent. rate 54 BPM PR interval 216 ms QRS duration 114 ms QT/QTcB 470/445 ms   CHA2DS2-VASc Score = 2  The patient's score is based upon: CHF History: 1 HTN History: 0 Diabetes History: 0 Stroke History: 0 Vascular Disease History: 0 Age Score: 0 Gender Score: 1       ASSESSMENT AND PLAN: Paroxysmal Atrial Fibrillation/atrial flutter The patient's CHA2DS2-VASc score is 2, indicating a 2.2% annual risk of stroke.   S/p afib ablation at [facility] 2018 S/p dofetilide  loading 05/2021 Patient in SR Continue dofetilide  250 mcg BID Continue bisoprolol  5 mg daily Continue Pradaxa  150 mg BID  High Risk Medication Monitoring (ICD 10: Z79.899) QT interval on ECG acceptable for dofetilide  monitoring. Recent bmet/mag reviewed.   HOCM S/p myectomy   Chronic HFpEF EF 65-70% GDMT per primary cardiology team Fluid status appears stable today  OSA  Encouraged nightly CPAP   Follow up in the AF clinic in October as scheduled.    Myrtha Ates PA-C [facility] [address] [place], [place] [address] [phone]

## 2023-10-25 DIAGNOSIS — I480 Paroxysmal atrial fibrillation: Secondary | ICD-10-CM | POA: Diagnosis not present

## 2023-10-25 DIAGNOSIS — E039 Hypothyroidism, unspecified: Secondary | ICD-10-CM | POA: Diagnosis not present

## 2023-10-25 DIAGNOSIS — I10 Essential (primary) hypertension: Secondary | ICD-10-CM | POA: Diagnosis not present

## 2023-10-25 DIAGNOSIS — M1991 Primary osteoarthritis, unspecified site: Secondary | ICD-10-CM | POA: Diagnosis not present

## 2023-10-25 DIAGNOSIS — R0902 Hypoxemia: Secondary | ICD-10-CM | POA: Diagnosis not present

## 2023-10-25 DIAGNOSIS — J449 Chronic obstructive pulmonary disease, unspecified: Secondary | ICD-10-CM | POA: Diagnosis not present

## 2023-10-25 DIAGNOSIS — M47816 Spondylosis without myelopathy or radiculopathy, lumbar region: Secondary | ICD-10-CM | POA: Diagnosis not present

## 2023-11-09 ENCOUNTER — Other Ambulatory Visit: Payer: Self-pay | Admitting: Internal Medicine

## 2023-11-09 DIAGNOSIS — Z1231 Encounter for screening mammogram for malignant neoplasm of breast: Secondary | ICD-10-CM

## 2023-11-15 ENCOUNTER — Inpatient Hospital Stay: Admission: RE | Admit: 2023-11-15 | Source: Ambulatory Visit

## 2023-12-22 DIAGNOSIS — I480 Paroxysmal atrial fibrillation: Secondary | ICD-10-CM | POA: Diagnosis not present

## 2023-12-22 DIAGNOSIS — I4891 Unspecified atrial fibrillation: Secondary | ICD-10-CM | POA: Diagnosis not present

## 2023-12-22 DIAGNOSIS — I10 Essential (primary) hypertension: Secondary | ICD-10-CM | POA: Diagnosis not present

## 2023-12-22 DIAGNOSIS — M47816 Spondylosis without myelopathy or radiculopathy, lumbar region: Secondary | ICD-10-CM | POA: Diagnosis not present

## 2023-12-22 DIAGNOSIS — G894 Chronic pain syndrome: Secondary | ICD-10-CM | POA: Diagnosis not present

## 2023-12-22 DIAGNOSIS — J449 Chronic obstructive pulmonary disease, unspecified: Secondary | ICD-10-CM | POA: Diagnosis not present

## 2023-12-22 DIAGNOSIS — M1991 Primary osteoarthritis, unspecified site: Secondary | ICD-10-CM | POA: Diagnosis not present

## 2024-02-21 ENCOUNTER — Encounter: Payer: Self-pay | Admitting: Family Medicine

## 2024-02-21 ENCOUNTER — Ambulatory Visit (INDEPENDENT_AMBULATORY_CARE_PROVIDER_SITE_OTHER): Admitting: Family Medicine

## 2024-02-21 VITALS — BP 100/60 | HR 67 | Temp 98.4°F | Ht 61.0 in | Wt 223.0 lb

## 2024-02-21 DIAGNOSIS — Z1159 Encounter for screening for other viral diseases: Secondary | ICD-10-CM | POA: Diagnosis not present

## 2024-02-21 DIAGNOSIS — I480 Paroxysmal atrial fibrillation: Secondary | ICD-10-CM | POA: Diagnosis not present

## 2024-02-21 DIAGNOSIS — Z7689 Persons encountering health services in other specified circumstances: Secondary | ICD-10-CM | POA: Diagnosis not present

## 2024-02-21 DIAGNOSIS — Z2821 Immunization not carried out because of patient refusal: Secondary | ICD-10-CM | POA: Diagnosis not present

## 2024-02-21 DIAGNOSIS — Z122 Encounter for screening for malignant neoplasm of respiratory organs: Secondary | ICD-10-CM

## 2024-02-21 DIAGNOSIS — I5032 Chronic diastolic (congestive) heart failure: Secondary | ICD-10-CM

## 2024-02-21 DIAGNOSIS — J449 Chronic obstructive pulmonary disease, unspecified: Secondary | ICD-10-CM

## 2024-02-21 DIAGNOSIS — E66813 Obesity, class 3: Secondary | ICD-10-CM

## 2024-02-21 DIAGNOSIS — Z1231 Encounter for screening mammogram for malignant neoplasm of breast: Secondary | ICD-10-CM

## 2024-02-21 DIAGNOSIS — D6869 Other thrombophilia: Secondary | ICD-10-CM

## 2024-02-21 DIAGNOSIS — Z6841 Body Mass Index (BMI) 40.0 and over, adult: Secondary | ICD-10-CM

## 2024-02-21 DIAGNOSIS — M51372 Other intervertebral disc degeneration, lumbosacral region with discogenic back pain and lower extremity pain: Secondary | ICD-10-CM

## 2024-02-21 DIAGNOSIS — I421 Obstructive hypertrophic cardiomyopathy: Secondary | ICD-10-CM

## 2024-02-21 MED ORDER — POTASSIUM CHLORIDE ER 10 MEQ PO TBCR: 40.0000 meq | EXTENDED_RELEASE_TABLET | ORAL | Status: AC

## 2024-02-21 NOTE — Progress Notes (Signed)
 I,Jameka J Llittleton, CMA,acting as a Neurosurgeon for [employer], NP.,have documented all relevant documentation on the behalf of Bruna Creighton, NP,as directed by  Bruna Creighton, NP while in the presence of Bruna Creighton, NP.  Subjective:  Patient ID: Kathleen Wright , female    DOB: 1966/05/08 , 58 y.o.   MRN: [id number]  Chief Complaint  Patient presents with   Establish Care    Patient presents today to establish care, she reports her previous pcp retired.    Hypothyroidism   Pain    Patient is requesting a refill on her oxycodone . She reports she has degenerative disc in her back and she has shoulder pain as well. She reports they did recommend shoulder surgery but she declined.     HPI Discussed the use of AI scribe software for clinical note transcription with the patient, who gave verbal consent to proceed.  History of Present Illness      Kathleen Wright is a 58 year old female with COPD, atrial flutter, AFib, and heart failure who presents to establish care.  She has a history of COPD and uses a CPAP machine at night. She was previously sent home with oxygen  after a hospital stay in April due to low oxygen  levels, but she no longer uses it regularly as her levels have stabilized. She experiences shortness of breath on exertion and occasional swelling. She was hospitalized in May for pneumonia.  She has a history of atrial flutter and AFib, managed with Pradaxa  due to an allergy to Eliquis . She follows up with a cardiologist and previously attended the AFib clinic at [facility] but now sees a different doctor after her previous provider retired. She does not have a pacemaker and has a history of open-heart surgery for an enlarged heart.  She has a history of heart failure, though she is unsure if it is diastolic or systolic but believes it might be diastolic.  She has a history of hypothyroidism and takes levothyroxine , which she started about two months ago after blood work indicated a thyroid   issue. She reports weight gain since starting the medication but is unsure if she feels any different otherwise.  She has a history of degenerative disc disease with pain primarily in her back, radiating to the top of her tailbone and slightly up her back. The pain previously radiated down her leg but no longer does. She has not had surgery for this condition. She was previously managed on hydrocodone  and then switched to oxycodone  for pain management, which she finds tolerable. Her previous primary care doctor managed her pain medication but has recently retired, leaving her in need of a new provider for pain management.  She has a history of smoking for 42 years, having quit three years ago.  She is encouraged to strive for BMI less than 30 to decrease cardiac risk. Advised to aim for at least 150 minutes of exercise per week.       Past Medical History:  Diagnosis Date   Asthma    Cervical cancer (HCC)    a. treated with partial removal of cervix   COPD (chronic obstructive pulmonary disease) (HCC)    Hypertrophic cardiomyopathy (HCC)    a. s/p myomectomy at [facility] 08/2004   Paroxysmal atrial flutter (HCC)    a. post-op after myomectomy in 2006. s/p TEE/DCCV.   Sleep apnea    Transient atrial fibrillation (HCC) 2006   a. after myomectomy at [facility] in 08/2004 - was on amiodarone but  developed elevated LFTs felt possibly secondary to amiodarone      Family History  Problem Relation Age of Onset   Heart failure Father    Diabetes Other    Lung disease Other      Current Outpatient Medications:    acetaminophen  (TYLENOL ) 325 MG tablet, Take 2 tablets (650 mg total) by mouth every 6 (six) hours as needed for mild pain (pain score 1-3) (or Fever >/= 101)., Disp: , Rfl:    albuterol  (PROVENTIL ) (2.5 MG/3ML) 0.083% nebulizer solution, Take 3 mLs (2.5 mg total) by nebulization every 4 (four) hours as needed for wheezing or shortness of breath., Disp: 75 mL, Rfl: 2   bisoprolol  (ZEBETA ) 5 MG  tablet, Take 1 tablet (5 mg total) by mouth daily. Take in Place of Metoprolol , Disp: 30 tablet, Rfl: 5   Cholecalciferol (VITAMIN D3 PO), Take 1 tablet by mouth daily., Disp: , Rfl:    dabigatran  (PRADAXA ) 150 MG CAPS capsule, Take 1 capsule (150 mg total) by mouth 2 (two) times daily., Disp: 60 capsule, Rfl: 6   docusate sodium  (COLACE) 100 MG capsule, Take 100 mg by mouth daily as needed for mild constipation., Disp: , Rfl:    dofetilide  (TIKOSYN ) 250 MCG capsule, TAKE (1) CAPSULE BY MOUTH TWICE DAILY., Disp: 60 capsule, Rfl: 6   hydroxypropyl methylcellulose / hypromellose (ISOPTO TEARS / GONIOVISC) 2.5 % ophthalmic solution, Place 1 drop into both eyes 2 (two) times daily as needed for dry eyes., Disp: , Rfl:    levothyroxine  (SYNTHROID ) 25 MCG tablet, Take 25 mcg by mouth daily., Disp: , Rfl:    omeprazole  (PRILOSEC) 40 MG capsule, Take 1 capsule (40 mg total) by mouth in the morning and at bedtime., Disp: 60 capsule, Rfl: 3   Oxycodone  HCl 10 MG TABS, Take 10 mg by mouth every 6 (six) hours., Disp: , Rfl:    Oxycodone  HCl 10 MG TABS, Take 1 tablet (10 mg total) by mouth every 6 (six) hours as needed., Disp: 60 tablet, Rfl: 0   potassium chloride  (KLOR-CON ) 10 MEQ tablet, Take 4 tablets (40 mEq total) by mouth See admin instructions. Take Only While taking Demadex /Torsemide , Disp: , Rfl:    PROAIR  HFA 108 (90 Base) MCG/ACT inhaler, Inhale 2 puffs into the lungs every 4 (four) hours as needed for wheezing or shortness of breath., Disp: 8 g, Rfl: 4   Tiotropium Bromide-Olodaterol (STIOLTO RESPIMAT ) 2.5-2.5 MCG/ACT AERS, Inhale 2 puffs into the lungs daily., Disp: 4 g, Rfl: 2   torsemide  (DEMADEX ) 20 MG tablet, Take 2 tablets (40 mg total) by mouth daily as needed (fluid, swelling)., Disp: 30 tablet, Rfl: 1   Allergies  Allergen Reactions   Amiodarone Other (See Comments)    Liver function     Review of Systems  Constitutional: Negative.   HENT: Negative.    Eyes: Negative.   Respiratory:  Negative.    Cardiovascular: Negative.   Musculoskeletal: Negative.   Skin: Negative.   Neurological: Negative.   Psychiatric/Behavioral: Negative.       Today's Vitals   02/21/24 1443  BP: 100/60  Pulse: 67  Temp: 98.4 F (36.9 C)  TempSrc: Oral  Weight: 223 lb (101.2 kg)  Height: 5' 1 (1.549 m)  PainSc: 6   PainLoc: Back   Body mass index is 42.14 kg/m.  Wt Readings from Last 3 Encounters:  02/21/24 223 lb (101.2 kg)  10/24/23 208 lb (94.3 kg)  10/18/23 206 lb (93.4 kg)    The ASCVD Risk  score (Arnett DK, et al., 2019) failed to calculate for the following reasons:   Cannot find a previous HDL lab   Cannot find a previous total cholesterol lab  Objective:  Physical Exam Constitutional:      Appearance: Normal appearance.  HENT:     Head: Normocephalic.  Cardiovascular:     Rate and Rhythm: Normal rate and regular rhythm.     Pulses: Normal pulses.     Heart sounds: Normal heart sounds.  Pulmonary:     Effort: Pulmonary effort is normal.     Breath sounds: Normal breath sounds.  Abdominal:     General: Bowel sounds are normal.  Neurological:     Mental Status: She is alert.         Assessment And Plan:  Encounter to establish care with new doctor  Paroxysmal atrial fibrillation (HCC) Assessment & Plan: On Tikosyn  250 mcg every day   Orders: -     CBC -     Basic metabolic panel with GFR -     TSH + free T4  Influenza vaccination declined  Pneumococcal vaccination declined  Herpes zoster vaccination declined  Chronic diastolic congestive heart failure (HCC)  Stage 3 severe COPD by GOLD classification (HCC) Assessment & Plan: Followed by pulmonology   Degeneration of intervertebral disc of lumbosacral region with discogenic back pain and lower extremity pain Assessment & Plan: Referred to pain management  Orders: -     Ambulatory referral to Pain Clinic  Screening mammogram for breast cancer -     3D Screening Mammogram, Left and  Right; Future  Encounter for hepatitis C screening test for low risk patient -     Hepatitis C antibody  Screening for lung cancer -     CT CHEST LUNG CANCER SCREENING LOW DOSE WO CONTRAST; Future  Class 3 severe obesity due to excess calories with serious comorbidity and body mass index (BMI) of 40.0 to 44.9 in adult Assessment & Plan: She is encouraged to strive for BMI less than 30 to decrease cardiac risk. Advised to aim for at least 150 minutes of exercise per week.    Other orders -     Potassium Chloride  ER; Take 4 tablets (40 mEq total) by mouth See admin instructions. Take Only While taking Demadex /Torsemide  -     oxyCODONE  HCl; Take 1 tablet (10 mg total) by mouth every 6 (six) hours as needed.  Dispense: 60 tablet; Refill: 0    Assessment & Plan Chronic Obstructive Pulmonary Disease (COPD) COPD with history of oxygen  use post-hospitalization, currently not using oxygen  regularly. Former smoker, quit 3 years ago. - Discuss potential need for nighttime oxygen  use with pulmonologist. - Order CT of the chest for annual screening due to smoking history. - Coordinate with pulmonologist for ongoing management.  Chronic Diastolic Heart Failure Chronic diastolic heart failure managed by cardiologist. - Continue management with cardiologist. - Monitor symptoms and follow up as needed.  Paroxysmal Atrial Fibrillation and Atrial Flutter Paroxysmal atrial fibrillation and atrial flutter managed with Pradaxa  due to Eliquis  allergy. - Continue Pradaxa  for anticoagulation. - Follow up at AFib clinic in October.  Obstructive Hypertrophic Cardiomyopathy Obstructive hypertrophic cardiomyopathy managed by cardiologist. - Continue management with cardiologist. - Monitor symptoms and follow up as needed.  Degenerative Disc Disease, Lumbosacral Region with Back Pain Chronic back pain due to degenerative disc disease in the lumbosacral region. - Refer to pain management for ongoing pain  control. - Continue current pain management regimen until  seen by pain management specialist.  Chronic Pain Syndrome Chronic pain syndrome managed with oxycodone . Requires new pain management referral. - Refer to pain management specialist in [place] or [place]. - Provide temporary prescription for pain management until seen by specialist.  Hypothyroidism Hypothyroidism managed with levothyroxine , reports weight gain since starting medication. - Check thyroid  function tests to evaluate response to levothyroxine . - Continue levothyroxine  as prescribed.  Establishing Primary Care Establishing care with new primary care provider for management of multiple chronic conditions. - Establish primary care with this office. - Transfer medical records from previous provider. - Coordinate care with specialists, including cardiologist and pulmonologist.  General Health Maintenance Due for mammogram, up to date with colonoscopy, declined flu shot today, pneumonia vaccination status unclear. - Schedule mammogram for next month. - Reassess for flu shot in a few weeks when feeling better.   Return in about 3 months (around 05/22/2024) for physical.  Patient was given opportunity to ask questions. Patient verbalized understanding of the plan and was able to repeat key elements of the plan. All questions were answered to their satisfaction.    I, Bruna Creighton, NP, have reviewed all documentation for this visit. The documentation on 02/28/2024 for the exam, diagnosis, procedures, and orders are all accurate and complete.    IF YOU HAVE BEEN REFERRED TO A SPECIALIST, IT MAY TAKE 1-2 WEEKS TO SCHEDULE/PROCESS THE REFERRAL. IF YOU HAVE NOT HEARD FROM US /SPECIALIST IN TWO WEEKS, PLEASE GIVE US  A CALL AT [phone] X [phone].

## 2024-02-22 LAB — BASIC METABOLIC PANEL WITH GFR
BUN/Creatinine Ratio: 15 (ref 9–23)
BUN: 12 mg/dL (ref 6–24)
CO2: 24 mmol/L (ref 20–29)
Calcium: 9.4 mg/dL (ref 8.7–10.2)
Chloride: 103 mmol/L (ref 96–106)
Creatinine, Ser: 0.81 mg/dL (ref 0.57–1.00)
Glucose: 88 mg/dL (ref 70–99)
Potassium: 4.9 mmol/L (ref 3.5–5.2)
Sodium: 140 mmol/L (ref 134–144)
eGFR: 84 mL/min/1.73 (ref >59)

## 2024-02-22 LAB — CBC
Hematocrit: 34.5 % (ref 34.0–46.6)
Hemoglobin: 11.1 g/dL (ref 11.1–15.9)
MCH: 27.8 pg (ref 26.6–33.0)
MCHC: 32.2 g/dL (ref 31.5–35.7)
MCV: 86 fL (ref 79–97)
Platelets: 325 x10E3/uL (ref 150–450)
RBC: 4 x10E6/uL (ref 3.77–5.28)
RDW: 14.4 % (ref 11.7–15.4)
WBC: 8.3 x10E3/uL (ref 3.4–10.8)

## 2024-02-22 LAB — TSH+FREE T4
Free T4: 1.16 ng/dL (ref 0.82–1.77)
TSH: 2.27 u[IU]/mL (ref 0.450–4.500)

## 2024-02-22 LAB — HEPATITIS C ANTIBODY: Hep C Virus Ab: NONREACTIVE

## 2024-02-28 ENCOUNTER — Ambulatory Visit: Payer: Self-pay | Admitting: Family Medicine

## 2024-02-28 DIAGNOSIS — Z6841 Body Mass Index (BMI) 40.0 and over, adult: Secondary | ICD-10-CM | POA: Insufficient documentation

## 2024-02-28 DIAGNOSIS — Z1159 Encounter for screening for other viral diseases: Secondary | ICD-10-CM | POA: Insufficient documentation

## 2024-02-28 DIAGNOSIS — Z122 Encounter for screening for malignant neoplasm of respiratory organs: Secondary | ICD-10-CM | POA: Insufficient documentation

## 2024-02-28 DIAGNOSIS — Z2821 Immunization not carried out because of patient refusal: Secondary | ICD-10-CM | POA: Insufficient documentation

## 2024-02-28 DIAGNOSIS — Z1231 Encounter for screening mammogram for malignant neoplasm of breast: Secondary | ICD-10-CM | POA: Insufficient documentation

## 2024-02-28 DIAGNOSIS — I5032 Chronic diastolic (congestive) heart failure: Secondary | ICD-10-CM | POA: Insufficient documentation

## 2024-02-28 DIAGNOSIS — Z7689 Persons encountering health services in other specified circumstances: Secondary | ICD-10-CM | POA: Insufficient documentation

## 2024-02-28 MED ORDER — OXYCODONE HCL 10 MG PO TABS: 10.0000 mg | ORAL_TABLET | Freq: Four times a day (QID) | ORAL | 0 refills | Status: AC | PRN

## 2024-02-28 NOTE — Assessment & Plan Note (Signed)
Referred to pain management.

## 2024-02-28 NOTE — Assessment & Plan Note (Signed)
 She is encouraged to strive for BMI less than 30 to decrease cardiac risk. Advised to aim for at least 150 minutes of exercise per week.

## 2024-02-28 NOTE — Assessment & Plan Note (Signed)
 Followed by pulmonology

## 2024-02-28 NOTE — Progress Notes (Signed)
 Normal labs.

## 2024-02-28 NOTE — Assessment & Plan Note (Signed)
 On Tikosyn  250 mcg every day

## 2024-03-14 ENCOUNTER — Other Ambulatory Visit: Payer: Self-pay | Admitting: Family Medicine

## 2024-03-14 NOTE — Telephone Encounter (Unsigned)
 Copied from CRM #[id number]. Topic: Clinical - Medication Refill >> Mar 14, 2024 10:50 AM Antwanette L wrote: Medication: Oxycodone  HCl 10 MG TABS  Has the patient contacted their pharmacy? No   This is the patient's preferred pharmacy:  [facility] - [place], [place] - [address] [place] [address] Phone: [phone] Fax: [phone]   Is this the correct pharmacy for this prescription? Yes   Has the prescription been filled recently? Yes. Last order was on 02/28/24  Is the patient out of the medication? Yes.   Has the patient been seen for an appointment in the last year OR does the patient have an upcoming appointment? Yes. Last ov w/ Bruna Creighton was on 02/21/24  Can we respond through MyChart? No. Contact the patient by phone at [phone]  Agent: Please be advised that Rx refills may take up to 3 business days. We ask that you follow-up with your pharmacy.

## 2024-03-18 ENCOUNTER — Telehealth: Payer: Self-pay | Admitting: Family Medicine

## 2024-03-18 NOTE — Telephone Encounter (Signed)
 Copied from CRM [id number]. Topic: Clinical - Refused Triage >> Mar 18, 2024 12:46 PM Antwanette L wrote: The patient called back to request an update on her medication refill(Oxycodone  HCl 10 MG TABS). She stated that she is currently out of medication and is awaiting an appointment with pain management. I informed the patient that the refill request was submitted on Friday and may take up to three business days to process. The patient stated that she is consistently experiencing pain. When asked if she would like to speak with a nurse, she declined.

## 2024-03-19 ENCOUNTER — Other Ambulatory Visit: Payer: Self-pay | Admitting: Family Medicine

## 2024-03-19 ENCOUNTER — Ambulatory Visit (HOSPITAL_COMMUNITY)
Admission: RE | Admit: 2024-03-19 | Discharge: 2024-03-19 | Disposition: A | Discharge status: Home / Self Care | Source: Ambulatory Visit | Attending: Family Medicine | Admitting: Family Medicine

## 2024-03-19 DIAGNOSIS — J439 Emphysema, unspecified: Secondary | ICD-10-CM | POA: Diagnosis not present

## 2024-03-19 DIAGNOSIS — I2510 Atherosclerotic heart disease of native coronary artery without angina pectoris: Secondary | ICD-10-CM | POA: Insufficient documentation

## 2024-03-19 DIAGNOSIS — Z87891 Personal history of nicotine dependence: Secondary | ICD-10-CM | POA: Diagnosis not present

## 2024-03-19 DIAGNOSIS — R911 Solitary pulmonary nodule: Secondary | ICD-10-CM | POA: Diagnosis not present

## 2024-03-19 DIAGNOSIS — R590 Localized enlarged lymph nodes: Secondary | ICD-10-CM | POA: Diagnosis not present

## 2024-03-19 DIAGNOSIS — I700 Atherosclerosis of aorta: Secondary | ICD-10-CM | POA: Diagnosis not present

## 2024-03-19 DIAGNOSIS — Z122 Encounter for screening for malignant neoplasm of respiratory organs: Secondary | ICD-10-CM | POA: Diagnosis present

## 2024-03-20 ENCOUNTER — Encounter: Payer: Self-pay | Admitting: Family Medicine

## 2024-03-22 ENCOUNTER — Telehealth: Payer: Self-pay

## 2024-03-22 NOTE — Telephone Encounter (Signed)
 Copied from CRM [id number]. Topic: Clinical - Medication Question >> Mar 22, 2024 12:21 PM [name] wrote: Reason for CRM: patient called to f/u on her refill for Oxycodone  HCl 10 MG TABS. She requested the refill on 10/2. Please f/u with patient

## 2024-03-27 ENCOUNTER — Ambulatory Visit (HOSPITAL_COMMUNITY)
Admission: RE | Admit: 2024-03-27 | Discharge: 2024-03-27 | Disposition: A | Discharge status: Home / Self Care | Source: Ambulatory Visit | Attending: Physician Assistant | Admitting: Physician Assistant

## 2024-03-27 VITALS — BP 110/62 | HR 61 | Ht 61.0 in | Wt 221.6 lb

## 2024-03-27 DIAGNOSIS — I4891 Unspecified atrial fibrillation: Secondary | ICD-10-CM | POA: Diagnosis not present

## 2024-03-27 DIAGNOSIS — Z5181 Encounter for therapeutic drug level monitoring: Secondary | ICD-10-CM

## 2024-03-27 DIAGNOSIS — Z79899 Other long term (current) drug therapy: Secondary | ICD-10-CM | POA: Diagnosis not present

## 2024-03-27 DIAGNOSIS — I480 Paroxysmal atrial fibrillation: Secondary | ICD-10-CM

## 2024-03-27 NOTE — Progress Notes (Signed)
 Primary Care Physician: Petrina Pries, NP Referring Physician: Dr. Cindie  Primary Cardiologist: Dr Jeffrie Primary EP: Dr Cindie Wright Wright Wright is a 58 y.o. female with a h/o a h/o persistent afib, COPD, HOCM, s/p afib ablation at [facility] in July of  2018. Patient is s/p dofetilide  loading 12/27-12/30/22. She presented in SR and did not require DCCV. Her dose had to be down titrated due to a short episode of WCT.   Patient returns for follow up for atrial fibrillation and dofetilide  monitoring. She remains in SR today and feels well. She did have one brief episode of afib which resolved after taking a dose of PRN diltiazem . There were no specific triggers that she could identify. No bleeding issues on anticoagulation.   Today, she  denies symptoms of palpitations, chest pain, orthopnea, PND, lower extremity edema, dizziness, presyncope, syncope, bleeding, or neurologic sequela. The patient is tolerating medications without difficulties and is otherwise without complaint today.    Past Medical History:  Diagnosis Date   Asthma    Cervical cancer (HCC)    a. treated with partial removal of cervix   COPD (chronic obstructive pulmonary disease) (HCC)    Hypertrophic cardiomyopathy (HCC)    a. s/p myomectomy at [facility] 08/2004   Paroxysmal atrial flutter (HCC)    a. post-op after myomectomy in 2006. s/p TEE/DCCV.   Sleep apnea    Transient atrial fibrillation (HCC) 2006   a. after myomectomy at [facility] in 08/2004 - was on amiodarone but developed elevated LFTs felt possibly secondary to amiodarone     Current Outpatient Medications  Medication Sig Dispense Refill   acetaminophen  (TYLENOL ) 325 MG tablet Take 2 tablets (650 mg total) by mouth every 6 (six) hours as needed for mild pain (pain score 1-3) (or Fever >/= 101). (Patient taking differently: Take 650 mg by mouth as needed for mild pain (pain score 1-3) (or Fever >/= 101).)     albuterol  (PROVENTIL ) (2.5 MG/3ML) 0.083%  nebulizer solution Take 3 mLs (2.5 mg total) by nebulization every 4 (four) hours as needed for wheezing or shortness of breath. 75 mL 2   bisoprolol  (ZEBETA ) 5 MG tablet Take 1 tablet (5 mg total) by mouth daily. Take in Place of Metoprolol  30 tablet 5   Cholecalciferol (VITAMIN D3 PO) Take 1 tablet by mouth daily.     dabigatran  (PRADAXA ) 150 MG CAPS capsule Take 1 capsule (150 mg total) by mouth 2 (two) times daily. 60 capsule 6   diltiazem  (CARDIZEM ) 30 MG tablet Take 30 mg by mouth as needed (For A-fib).     docusate sodium  (COLACE) 100 MG capsule Take 100 mg by mouth daily as needed for mild constipation.     dofetilide  (TIKOSYN ) 250 MCG capsule TAKE (1) CAPSULE BY MOUTH TWICE DAILY. 60 capsule 6   levothyroxine  (SYNTHROID ) 25 MCG tablet Take 25 mcg by mouth daily.     omeprazole  (PRILOSEC) 40 MG capsule Take 1 capsule (40 mg total) by mouth in the morning and at bedtime. 60 capsule 3   Oxycodone  HCl 10 MG TABS Take 1 tablet (10 mg total) by mouth every 6 (six) hours as needed. 60 tablet 0   potassium chloride  (KLOR-CON ) 10 MEQ tablet Take 4 tablets (40 mEq total) by mouth See admin instructions. Take Only While taking Demadex /Torsemide      PROAIR  HFA 108 (90 Base) MCG/ACT inhaler Inhale 2 puffs into the lungs every 4 (four) hours as needed for wheezing or shortness of  breath. 8 g 4   Tiotropium Bromide-Olodaterol (STIOLTO RESPIMAT ) 2.5-2.5 MCG/ACT AERS Inhale 2 puffs into the lungs daily. 4 g 2   torsemide  (DEMADEX ) 20 MG tablet Take 2 tablets (40 mg total) by mouth daily as needed (fluid, swelling). (Patient taking differently: Take 40 mg by mouth as needed (fluid, swelling).) 30 tablet 1   No current facility-administered medications for this encounter.    ROS- All systems are reviewed and negative except as per the HPI above  Physical Exam: Vitals:   03/27/24 1003  BP: 110/62  Pulse: 61  Weight: 100.5 kg  Height: 5' 1 (1.549 m)    Wt Readings from Last 3 Encounters:  03/27/24  100.5 kg  02/21/24 101.2 kg  10/24/23 94.3 kg    GEN: Well nourished, well developed in no acute distress CARDIAC: Regular rate and rhythm, no murmurs, rubs, gallops RESPIRATORY:  Clear to auscultation without rales, wheezing or rhonchi  ABDOMEN: Soft, non-tender, non-distended EXTREMITIES:  No edema; No deformity    EKG today demonstrates SR, 1st degree AV block Vent. rate 61 BPM PR interval 252 ms QRS duration 116 ms QT/QTcB 490/493 ms   CHA2DS2-VASc Score = 2  The patient's score is based upon: CHF History: 1 HTN History: 0 Diabetes History: 0 Stroke History: 0 Vascular Disease History: 0 Age Score: 0 Gender Score: 1       ASSESSMENT AND PLAN: Paroxysmal Atrial Fibrillation/atrial flutter (ICD10:  I48.0) The patient's CHA2DS2-VASc score is 2, indicating a 2.2% annual risk of stroke.   S/p afib ablation at [facility] 2018 S/p dofetilide  loading 05/2021 Continue bisoprolol  5 mg daily Continue dabigatran  150 mg BID Continue dofetilide  250 mcg BID  High Risk Medication Monitoring (ICD 10: Z79.899) Patient requires ongoing monitoring for anti-arrhythmic medication which has the potential to cause life threatening arrhythmias. QT interval on ECG acceptable for dofetilide  monitoring. Recent bmet reviewed, check magnesium  today.   HOCM S/p myectomy  Chronic HFpEF EF 65-70% Fluid status appears stable today  OSA  Encouraged nightly CPAP   Follow up in the AF clinic in 6 months.    Daril Kicks PA-C [facility] [address] [place], [place] [address] [phone]

## 2024-04-02 ENCOUNTER — Other Ambulatory Visit: Payer: Self-pay | Admitting: Family Medicine

## 2024-04-03 ENCOUNTER — Other Ambulatory Visit: Payer: Self-pay

## 2024-04-03 MED ORDER — BISOPROLOL FUMARATE 5 MG PO TABS: 5.0000 mg | ORAL_TABLET | Freq: Every day | ORAL | 1 refills | Status: AC

## 2024-04-29 ENCOUNTER — Other Ambulatory Visit (HOSPITAL_COMMUNITY): Payer: Self-pay | Admitting: Nurse Practitioner

## 2024-04-29 DIAGNOSIS — Z780 Asymptomatic menopausal state: Secondary | ICD-10-CM

## 2024-05-16 ENCOUNTER — Encounter (HOSPITAL_COMMUNITY): Payer: Self-pay

## 2024-05-17 ENCOUNTER — Telehealth: Payer: Self-pay

## 2024-05-17 NOTE — Telephone Encounter (Signed)
 Copied from CRM #[id number]. Topic: Appointments - Scheduling Inquiry for Clinic >> May 16, 2024  4:04 PM Santiya F wrote: Reason for CRM: Patient is calling in because she received a call from the office regarding rescheduling an appointment. Attempted to reschedule appointment, but no available dates and the provider is no longer accepting patients. Attempted to schedule with another provider, but the time was too far out for patient. Patient wants to know if she can be seen by someone before the end of the year. Please advise.

## 2024-05-24 ENCOUNTER — Ambulatory Visit: Payer: Self-pay | Admitting: Family Medicine

## 2024-06-11 ENCOUNTER — Telehealth: Payer: Self-pay

## 2024-06-11 NOTE — Telephone Encounter (Signed)
 scheduling error called patient to reschedule appt due to office closing early on new years eve

## 2024-06-12 ENCOUNTER — Encounter: Payer: Self-pay | Admitting: Family Medicine

## 2024-06-19 ENCOUNTER — Ambulatory Visit (HOSPITAL_COMMUNITY)
Admission: RE | Admit: 2024-06-19 | Discharge: 2024-06-19 | Disposition: A | Discharge status: Home / Self Care | Source: Ambulatory Visit | Attending: Family Medicine | Admitting: Family Medicine

## 2024-06-19 ENCOUNTER — Encounter (HOSPITAL_COMMUNITY): Payer: Self-pay

## 2024-06-19 ENCOUNTER — Ambulatory Visit (HOSPITAL_COMMUNITY)
Admission: RE | Admit: 2024-06-19 | Discharge: 2024-06-19 | Disposition: A | Discharge status: Home / Self Care | Source: Ambulatory Visit | Attending: Nurse Practitioner | Admitting: Nurse Practitioner

## 2024-06-19 DIAGNOSIS — Z1231 Encounter for screening mammogram for malignant neoplasm of breast: Secondary | ICD-10-CM | POA: Insufficient documentation

## 2024-06-19 DIAGNOSIS — Z780 Asymptomatic menopausal state: Secondary | ICD-10-CM | POA: Diagnosis present

## 2024-07-12 ENCOUNTER — Encounter: Payer: Self-pay | Admitting: Family Medicine

## 2024-07-19 ENCOUNTER — Encounter: Payer: Self-pay | Admitting: Family Medicine

## 2024-08-07 ENCOUNTER — Encounter: Admitting: Family Medicine

## 2024-09-25 ENCOUNTER — Ambulatory Visit (HOSPITAL_COMMUNITY): Admitting: Physician Assistant
